# Patient Record
Sex: Male | Born: 1940
Health system: Southern US, Community
[De-identification: ages and names within clinical notes are randomized; demographics above are authoritative.]

## PROBLEM LIST (undated history)

## (undated) DIAGNOSIS — M1990 Unspecified osteoarthritis, unspecified site: Secondary | ICD-10-CM

## (undated) DIAGNOSIS — E785 Hyperlipidemia, unspecified: Secondary | ICD-10-CM

## (undated) DIAGNOSIS — I10 Essential (primary) hypertension: Secondary | ICD-10-CM

## (undated) DIAGNOSIS — E119 Type 2 diabetes mellitus without complications: Secondary | ICD-10-CM

## (undated) DIAGNOSIS — N400 Enlarged prostate without lower urinary tract symptoms: Secondary | ICD-10-CM

## (undated) DIAGNOSIS — I2510 Atherosclerotic heart disease of native coronary artery without angina pectoris: Secondary | ICD-10-CM

## (undated) HISTORY — PX: CHOLECYSTECTOMY, LAPAROSCOPIC: SHX56

## (undated) HISTORY — DX: Benign prostatic hyperplasia without lower urinary tract symptoms: N40.0

## (undated) HISTORY — DX: Unspecified osteoarthritis, unspecified site: M19.90

## (undated) HISTORY — PX: APPENDECTOMY: SHX54

## (undated) HISTORY — DX: Atherosclerotic heart disease of native coronary artery without angina pectoris: I25.10

## (undated) HISTORY — DX: Essential (primary) hypertension: I10

## (undated) HISTORY — DX: Hyperlipidemia, unspecified: E78.5

---

## 2003-06-23 ENCOUNTER — Other Ambulatory Visit: Payer: Self-pay

## 2004-10-04 ENCOUNTER — Ambulatory Visit: Payer: Self-pay | Admitting: Internal Medicine

## 2004-11-03 ENCOUNTER — Ambulatory Visit: Payer: Self-pay | Admitting: Internal Medicine

## 2004-12-03 ENCOUNTER — Ambulatory Visit: Payer: Self-pay | Admitting: Internal Medicine

## 2007-07-25 ENCOUNTER — Ambulatory Visit: Payer: Self-pay | Admitting: Internal Medicine

## 2007-09-05 ENCOUNTER — Ambulatory Visit: Payer: Self-pay | Admitting: Internal Medicine

## 2008-08-04 ENCOUNTER — Ambulatory Visit: Payer: Self-pay | Admitting: Internal Medicine

## 2008-09-22 ENCOUNTER — Ambulatory Visit: Payer: Self-pay | Admitting: Unknown Physician Specialty

## 2008-11-05 ENCOUNTER — Ambulatory Visit: Payer: Self-pay | Admitting: Anesthesiology

## 2008-11-17 ENCOUNTER — Ambulatory Visit: Payer: Self-pay | Admitting: Internal Medicine

## 2008-11-25 ENCOUNTER — Ambulatory Visit: Payer: Self-pay | Admitting: Anesthesiology

## 2008-12-18 ENCOUNTER — Ambulatory Visit: Payer: Self-pay | Admitting: Anesthesiology

## 2009-01-19 ENCOUNTER — Ambulatory Visit: Payer: Self-pay | Admitting: Anesthesiology

## 2009-08-25 ENCOUNTER — Ambulatory Visit: Payer: Self-pay | Admitting: Anesthesiology

## 2009-09-10 ENCOUNTER — Ambulatory Visit: Payer: Self-pay | Admitting: Anesthesiology

## 2009-10-17 ENCOUNTER — Emergency Department: Payer: Self-pay | Admitting: Emergency Medicine

## 2009-10-19 ENCOUNTER — Ambulatory Visit: Payer: Self-pay | Admitting: Anesthesiology

## 2009-11-19 ENCOUNTER — Ambulatory Visit: Payer: Self-pay | Admitting: Anesthesiology

## 2009-12-09 ENCOUNTER — Ambulatory Visit: Payer: Self-pay | Admitting: Ophthalmology

## 2009-12-29 ENCOUNTER — Ambulatory Visit: Payer: Self-pay | Admitting: Ophthalmology

## 2010-07-03 IMAGING — CT CT ABD-PELV W/ CM
1 of 2 series · 14 of 32 positions shown, 18 images · non-contrast
Comparison: none

REASON FOR EXAM: right groin pain eval for hernia  METFORMIN
COMMENTS:

[Series 2: soft tissue · axial · 0.72mm/px · z∈[-527,-63]mm · 14 of 64 slices shown, 18 images]
[im 3/64  soft-tissue]
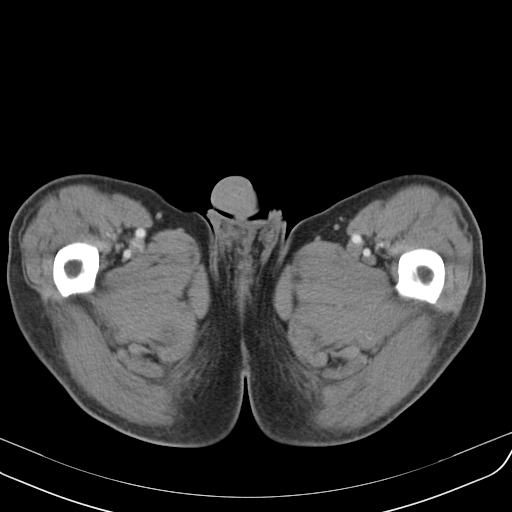
[im 3/64  bone]
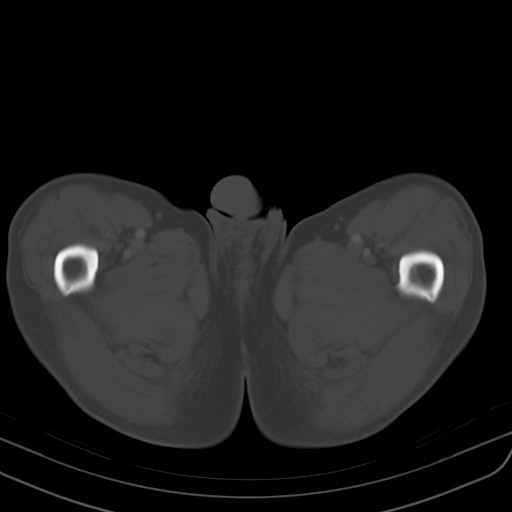
[im 9/64  soft-tissue]
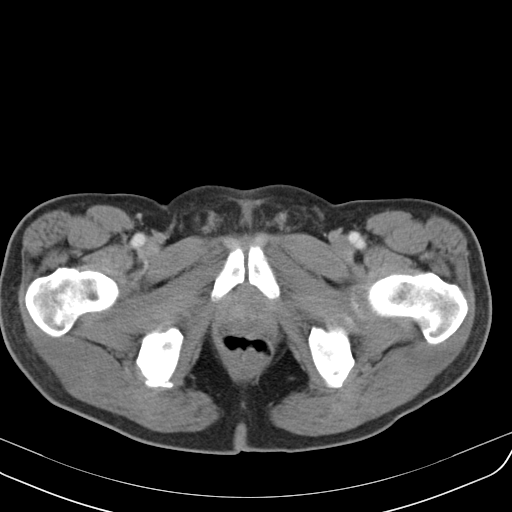
[im 14/64  soft-tissue]
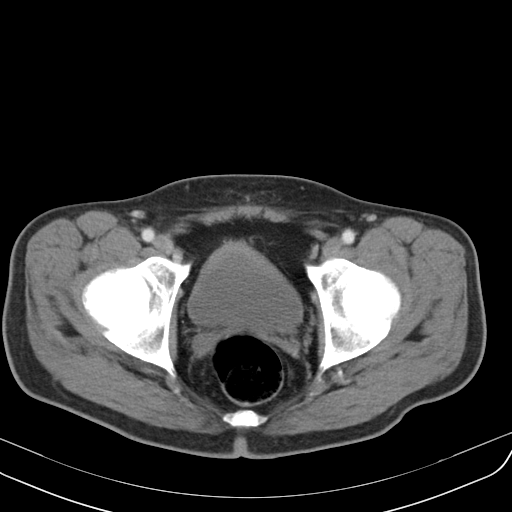
[im 20/64  soft-tissue]
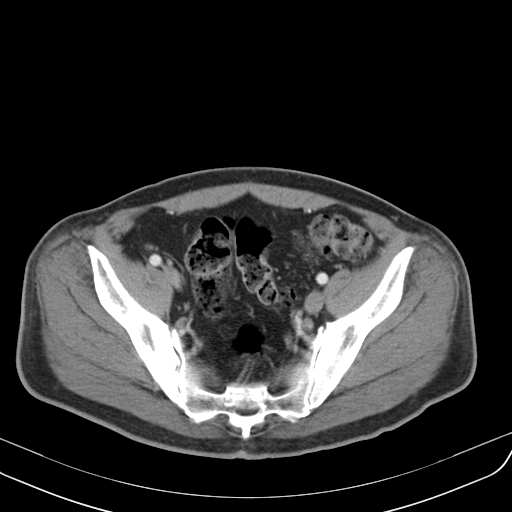
[im 25/64  soft-tissue]
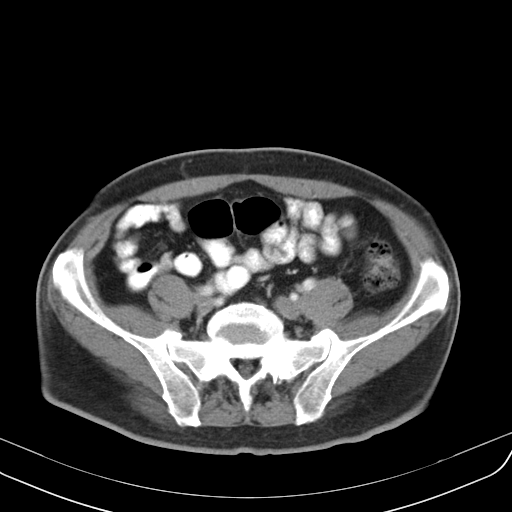
[im 31/64  soft-tissue]
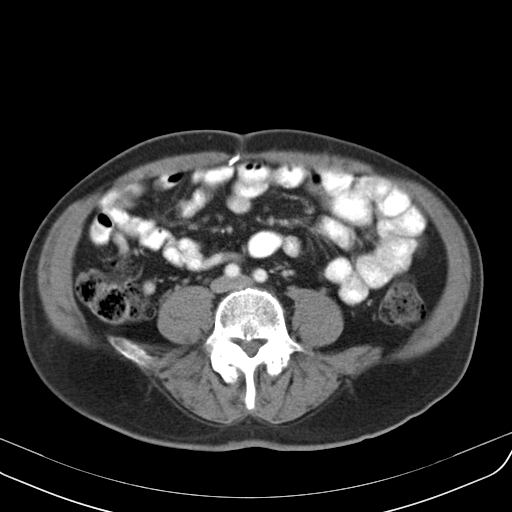
[im 33/64  soft-tissue]
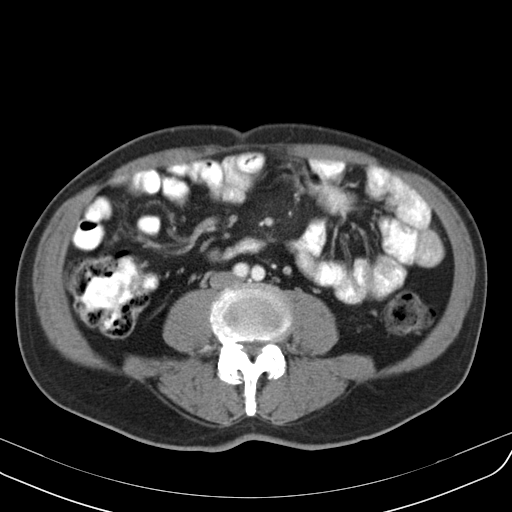
[im 39/64  soft-tissue]
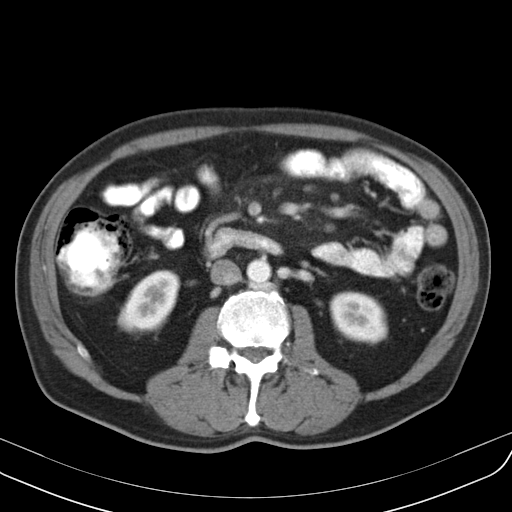
[im 44/64  soft-tissue]
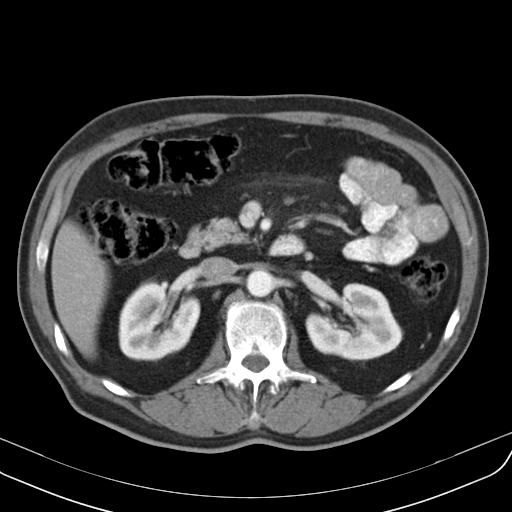
[im 44/64  bone]
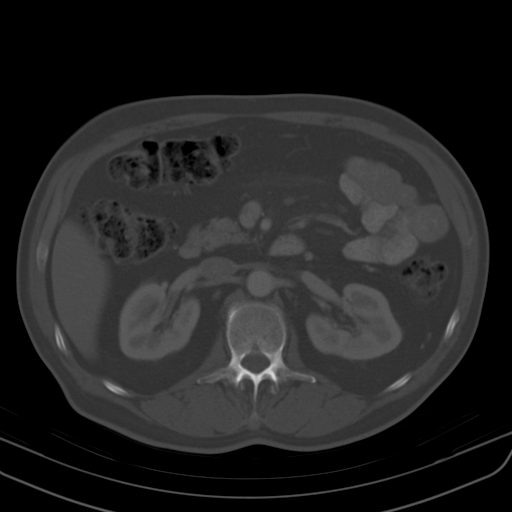
[im 50/64  soft-tissue]
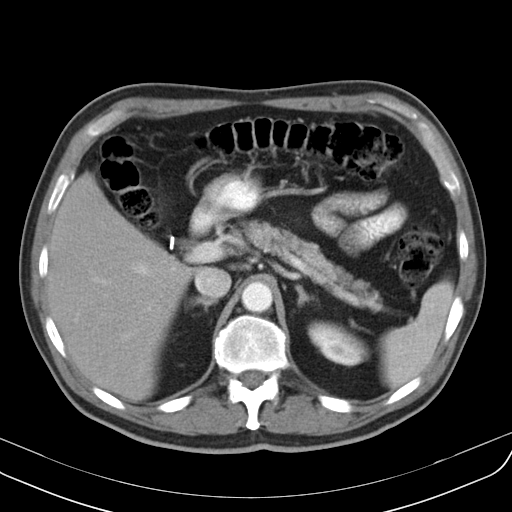
[im 53/64  lung]
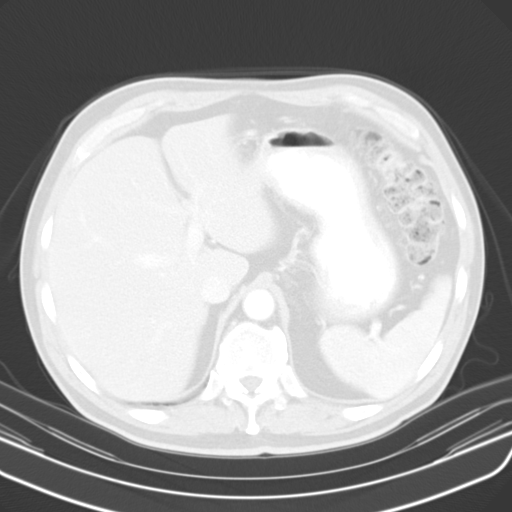
[im 55/64  soft-tissue]
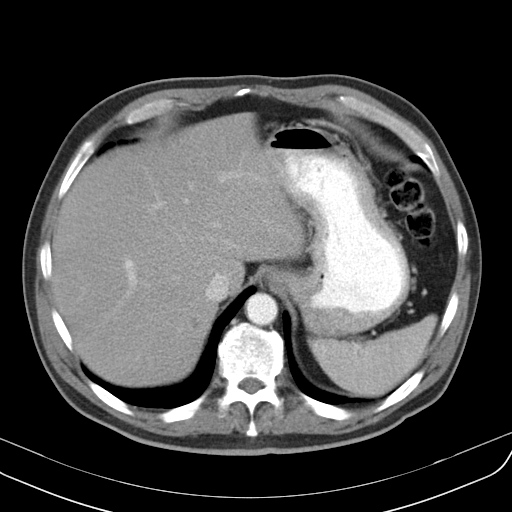
[im 55/64  lung]
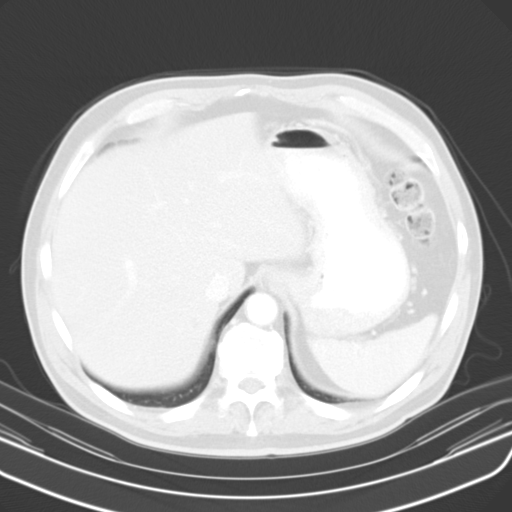
[im 58/64  lung]
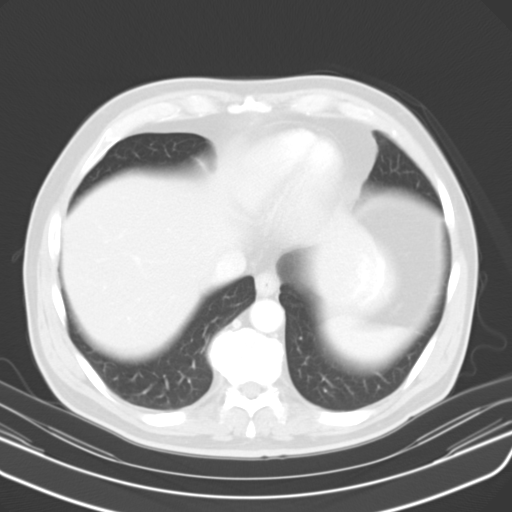
[im 61/64  soft-tissue]
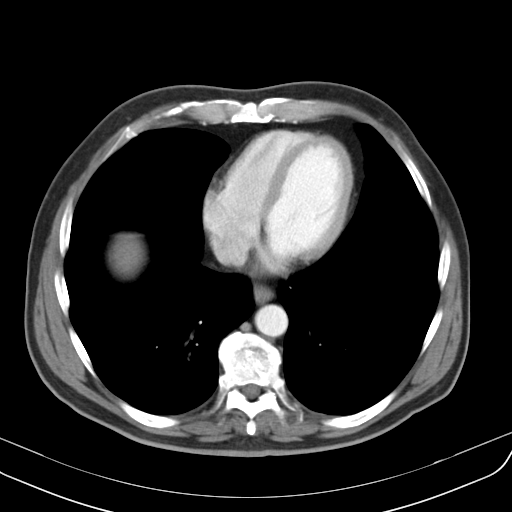
[im 61/64  lung]
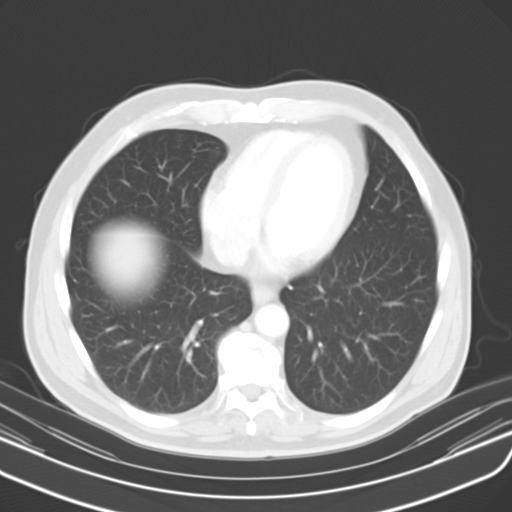

[14 of 32 positions shown; findings below may reference images not displayed]

PROCEDURE:     LOVREM - LOVREM ABDOMEN / PELVIS W  - July 25, 2007  [DATE]

RESULT:     The patient is reporting RIGHT lower quadrant discomfort. The
patient received oral contrast as well as 100 ml of Isovue 300.

In the RIGHT lower quadrant of the abdomen the appearance of the cecum and
ascending colon is grossly normal. I do not see objective evidence of acute
appendicitis nor of acute colitis. The terminal ileum is normal in
appearance. There is a small inguinal hernia on the RIGHT and similar
findings on the LEFT but only fat is visible within them. There is no
evidence of ascites. The urinary bladder is normal in appearance. The
prostate gland is mildly enlarged and produces an impression upon the
urinary bladder base. The sigmoid colon and rectum are grossly normal. The
caliber of the abdominal aorta is normal. The adrenal glands, kidneys,
spleen, partially distended stomach, and pancreas exhibit no acute
abnormality. Mildly increased density in the mesenteric fat is seen inferior
to the level of the transverse colon. This is nonspecific. The gallbladder
is surgically absent. The liver exhibits a subcapsular hypodensity seen on
image #9. It measures approximately 8 x 10 mm and has Hounsfield measurement
of 37. This is a nonspecific finding. The lung bases are clear. The lumbar
vertebral bodies are preserved in height
IMPRESSION: 1. I do not see evidence of urinary tract obstruction or other acute urinary
tract abnormality.
2. The appendix is not discretely identified but I see no findings
suspicious for an abnormal appendix. The terminal ileum and cecum and
ascending colon exhibit no acute abnormality. There is no evidence of bowel
obstruction.
3. There is enlargement of the prostate gland with a moderate impression
made upon the urinary bladder base.
4. There is hypodensity in the RIGHT lobe of the liver seen best on image #9
which is nonspecific. This will merit follow-up scanning to assure
stability. It likely reflects a hemangioma.
5. There are very small inguinal hernias bilaterally containing fat. I do
not see evidence of nearby bowel loops. These hernias are quite minimal and
may be asymptomatic.

## 2012-10-25 ENCOUNTER — Emergency Department: Payer: Self-pay | Admitting: Emergency Medicine

## 2012-11-15 ENCOUNTER — Emergency Department: Payer: Self-pay | Admitting: Internal Medicine

## 2012-12-13 ENCOUNTER — Ambulatory Visit: Payer: Self-pay | Admitting: Family Medicine

## 2013-05-14 ENCOUNTER — Emergency Department: Payer: Self-pay | Admitting: Emergency Medicine

## 2013-05-14 LAB — CBC
HCT: 45.6 % (ref 40.0–52.0)
HGB: 15 g/dL (ref 13.0–18.0)
MCH: 28 pg (ref 26.0–34.0)
MCHC: 32.9 g/dL (ref 32.0–36.0)
MCV: 85 fL (ref 80–100)
Platelet: 241 10*3/uL (ref 150–440)
RBC: 5.36 10*6/uL (ref 4.40–5.90)
RDW: 14 % (ref 11.5–14.5)
WBC: 6.9 10*3/uL (ref 3.8–10.6)

## 2013-05-14 LAB — URINALYSIS, COMPLETE
Bilirubin,UR: NEGATIVE
KETONE: NEGATIVE
Nitrite: NEGATIVE
Ph: 5 (ref 4.5–8.0)
Protein: 30
RBC,UR: 7 /HPF (ref 0–5)
SPECIFIC GRAVITY: 1.029 (ref 1.003–1.030)
WBC UR: 291 /HPF (ref 0–5)

## 2013-05-14 LAB — COMPREHENSIVE METABOLIC PANEL
ALT: 14 U/L (ref 12–78)
AST: 20 U/L (ref 15–37)
Albumin: 3.5 g/dL (ref 3.4–5.0)
Alkaline Phosphatase: 224 U/L — ABNORMAL HIGH
Anion Gap: 6 — ABNORMAL LOW (ref 7–16)
BILIRUBIN TOTAL: 0.5 mg/dL (ref 0.2–1.0)
BUN: 10 mg/dL (ref 7–18)
CALCIUM: 9.3 mg/dL (ref 8.5–10.1)
CO2: 29 mmol/L (ref 21–32)
CREATININE: 1.16 mg/dL (ref 0.60–1.30)
Chloride: 100 mmol/L (ref 98–107)
EGFR (African American): >60
EGFR (Non-African Amer.): >60
Glucose: 263 mg/dL — ABNORMAL HIGH (ref 65–99)
Osmolality: 278 (ref 275–301)
POTASSIUM: 3.7 mmol/L (ref 3.5–5.1)
Sodium: 135 mmol/L — ABNORMAL LOW (ref 136–145)
Total Protein: 7.8 g/dL (ref 6.4–8.2)

## 2013-05-15 LAB — URINE CULTURE

## 2014-06-17 ENCOUNTER — Other Ambulatory Visit: Payer: Self-pay | Admitting: Orthopedic Surgery

## 2014-06-17 DIAGNOSIS — M5412 Radiculopathy, cervical region: Secondary | ICD-10-CM

## 2014-06-23 ENCOUNTER — Ambulatory Visit
Admission: RE | Admit: 2014-06-23 | Discharge: 2014-06-23 | Disposition: A | Discharge status: Home / Self Care | Payer: Medicare Other | Source: Ambulatory Visit | Attending: Orthopedic Surgery | Admitting: Orthopedic Surgery

## 2014-06-23 DIAGNOSIS — M4802 Spinal stenosis, cervical region: Secondary | ICD-10-CM | POA: Insufficient documentation

## 2014-06-23 DIAGNOSIS — M542 Cervicalgia: Secondary | ICD-10-CM | POA: Diagnosis present

## 2014-06-23 DIAGNOSIS — M4852XA Collapsed vertebra, not elsewhere classified, cervical region, initial encounter for fracture: Secondary | ICD-10-CM | POA: Insufficient documentation

## 2014-06-23 DIAGNOSIS — M5412 Radiculopathy, cervical region: Secondary | ICD-10-CM

## 2014-06-23 DIAGNOSIS — M5030 Other cervical disc degeneration, unspecified cervical region: Secondary | ICD-10-CM | POA: Insufficient documentation

## 2014-06-23 DIAGNOSIS — G9589 Other specified diseases of spinal cord: Secondary | ICD-10-CM | POA: Insufficient documentation

## 2014-06-23 DIAGNOSIS — M4682 Other specified inflammatory spondylopathies, cervical region: Secondary | ICD-10-CM | POA: Insufficient documentation

## 2014-09-11 ENCOUNTER — Emergency Department
Admission: EM | Admit: 2014-09-11 | Discharge: 2014-09-11 | Disposition: A | Discharge status: Home / Self Care | Payer: Medicare Other | Attending: Emergency Medicine | Admitting: Emergency Medicine

## 2014-09-11 ENCOUNTER — Encounter: Payer: Self-pay | Admitting: Emergency Medicine

## 2014-09-11 DIAGNOSIS — Y998 Other external cause status: Secondary | ICD-10-CM | POA: Diagnosis not present

## 2014-09-11 DIAGNOSIS — Y92410 Unspecified street and highway as the place of occurrence of the external cause: Secondary | ICD-10-CM | POA: Diagnosis not present

## 2014-09-11 DIAGNOSIS — E119 Type 2 diabetes mellitus without complications: Secondary | ICD-10-CM | POA: Insufficient documentation

## 2014-09-11 DIAGNOSIS — Z794 Long term (current) use of insulin: Secondary | ICD-10-CM | POA: Insufficient documentation

## 2014-09-11 DIAGNOSIS — Y9389 Activity, other specified: Secondary | ICD-10-CM | POA: Insufficient documentation

## 2014-09-11 DIAGNOSIS — S61431A Puncture wound without foreign body of right hand, initial encounter: Secondary | ICD-10-CM | POA: Insufficient documentation

## 2014-09-11 DIAGNOSIS — Z79899 Other long term (current) drug therapy: Secondary | ICD-10-CM | POA: Diagnosis not present

## 2014-09-11 DIAGNOSIS — W540XXA Bitten by dog, initial encounter: Secondary | ICD-10-CM | POA: Insufficient documentation

## 2014-09-11 DIAGNOSIS — S61451A Open bite of right hand, initial encounter: Secondary | ICD-10-CM | POA: Diagnosis present

## 2014-09-11 HISTORY — DX: Type 2 diabetes mellitus without complications: E11.9
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
## 2014-09-11 NOTE — Discharge Instructions (Signed)
Animal Bite Animal bite wounds can get infected. It is important to get proper medical treatment. Ask your doctor if you need a rabies shot. HOME CARE   Follow your doctor's instructions for taking care of your wound.  Only take medicine as told by your doctor.  Take your medicine (antibiotics) as told. Finish them even if you start to feel better.  Keep all doctor visits as told. You may need a tetanus shot if:   You cannot remember when you had your last tetanus shot.  You have never had a tetanus shot.  The injury broke your skin. If you need a tetanus shot and you choose not to have one, you may get tetanus. Sickness from tetanus can be serious. GET HELP RIGHT AWAY IF:   Your wound is warm, red, sore, or puffy (swollen).  You notice yellowish-white fluid (pus) or a bad smell coming from the wound.  You see a red line on the skin coming from the wound.  You have a fever, chills, or you feel sick.  You feel sick to your stomach (nauseous), or you throw up (vomit).  Your pain does not go away, or it gets worse.  You have trouble moving the injured part.  You have questions or concerns. MAKE SURE YOU:   Understand these instructions.  Will watch your condition.  Will get help right away if you are not doing well or get worse. Document Released: 12/20/2004 Document Revised: 03/14/2011 Document Reviewed: 08/11/2010 [facility] Patient Information 2015 [place], [place]. This information is not intended to replace advice given to you by your health care provider. Make sure you discuss any questions you have with your health care provider.    FOLLOW UP WITH YOUR DOCTOR IF ANY CONTINUED PROBLEM   CLEAN EVERY DAY WITH MILD SOAP AND WATER TAKE ANTIBIOTICS THAT YOUR DOCTOR CALLED IN RETURN IF ANY SIGNS OF INFECTION OR IF [place] [employer] RECOMMENDS RABIES SHOTS

## 2014-10-20 ENCOUNTER — Encounter
Admission: EM | Disposition: A | Discharge status: Home / Self Care | Payer: Self-pay | Source: Home / Self Care | Attending: Internal Medicine

## 2014-10-20 ENCOUNTER — Inpatient Hospital Stay
Admission: EM | Admit: 2014-10-20 | Discharge: 2014-10-22 | DRG: 247 | Disposition: A | Discharge status: Home / Self Care | Payer: Medicare Other | Attending: Internal Medicine | Admitting: Internal Medicine

## 2014-10-20 DIAGNOSIS — Z955 Presence of coronary angioplasty implant and graft: Secondary | ICD-10-CM | POA: Diagnosis not present

## 2014-10-20 DIAGNOSIS — M1990 Unspecified osteoarthritis, unspecified site: Secondary | ICD-10-CM | POA: Diagnosis present

## 2014-10-20 DIAGNOSIS — E1159 Type 2 diabetes mellitus with other circulatory complications: Secondary | ICD-10-CM | POA: Diagnosis present

## 2014-10-20 DIAGNOSIS — Z79899 Other long term (current) drug therapy: Secondary | ICD-10-CM

## 2014-10-20 DIAGNOSIS — Z87891 Personal history of nicotine dependence: Secondary | ICD-10-CM

## 2014-10-20 DIAGNOSIS — E876 Hypokalemia: Secondary | ICD-10-CM | POA: Diagnosis present

## 2014-10-20 DIAGNOSIS — I2510 Atherosclerotic heart disease of native coronary artery without angina pectoris: Secondary | ICD-10-CM

## 2014-10-20 DIAGNOSIS — I10 Essential (primary) hypertension: Secondary | ICD-10-CM | POA: Diagnosis present

## 2014-10-20 DIAGNOSIS — I2109 ST elevation (STEMI) myocardial infarction involving other coronary artery of anterior wall: Secondary | ICD-10-CM | POA: Diagnosis present

## 2014-10-20 DIAGNOSIS — E785 Hyperlipidemia, unspecified: Secondary | ICD-10-CM | POA: Diagnosis present

## 2014-10-20 DIAGNOSIS — I2102 ST elevation (STEMI) myocardial infarction involving left anterior descending coronary artery: Secondary | ICD-10-CM | POA: Diagnosis not present

## 2014-10-20 DIAGNOSIS — R079 Chest pain, unspecified: Secondary | ICD-10-CM | POA: Diagnosis not present

## 2014-10-20 DIAGNOSIS — N400 Enlarged prostate without lower urinary tract symptoms: Secondary | ICD-10-CM | POA: Diagnosis present

## 2014-10-20 DIAGNOSIS — E1165 Type 2 diabetes mellitus with hyperglycemia: Secondary | ICD-10-CM | POA: Diagnosis present

## 2014-10-20 DIAGNOSIS — Z794 Long term (current) use of insulin: Secondary | ICD-10-CM | POA: Diagnosis present

## 2014-10-20 HISTORY — PX: CARDIAC CATHETERIZATION: SHX172

## 2014-10-20 LAB — CBC
HEMATOCRIT: 48.4 % (ref 40.0–52.0)
HEMOGLOBIN: 16.8 g/dL (ref 13.0–18.0)
MCH: 30.5 pg (ref 26.0–34.0)
MCHC: 34.6 g/dL (ref 32.0–36.0)
MCV: 87.9 fL (ref 80.0–100.0)
Platelets: 182 10*3/uL (ref 150–440)
RBC: 5.5 MIL/uL (ref 4.40–5.90)
RDW: 13.7 % (ref 11.5–14.5)
WBC: 11.2 10*3/uL — AB (ref 3.8–10.6)

## 2014-10-20 LAB — BASIC METABOLIC PANEL
ANION GAP: 12 (ref 5–15)
BUN: 14 mg/dL (ref 6–20)
CHLORIDE: 99 mmol/L — AB (ref 101–111)
CO2: 27 mmol/L (ref 22–32)
Calcium: 10.9 mg/dL — ABNORMAL HIGH (ref 8.9–10.3)
Creatinine, Ser: 1.18 mg/dL (ref 0.61–1.24)
GFR, EST NON AFRICAN AMERICAN: 60 mL/min — AB (ref >60)
Glucose, Bld: 315 mg/dL — ABNORMAL HIGH (ref 65–99)
POTASSIUM: 3.6 mmol/L (ref 3.5–5.1)
SODIUM: 138 mmol/L (ref 135–145)

## 2014-10-20 LAB — MRSA PCR SCREENING: MRSA by PCR: NEGATIVE

## 2014-10-20 LAB — PROTIME-INR
INR: 1.06
Prothrombin Time: 14 seconds (ref 11.4–15.0)

## 2014-10-20 LAB — DIFFERENTIAL
BASOS PCT: 1 %
Basophils Absolute: 0.1 10*3/uL (ref 0–0.1)
Eosinophils Absolute: 0.1 10*3/uL (ref 0–0.7)
Eosinophils Relative: 1 %
LYMPHS ABS: 1.9 10*3/uL (ref 1.0–3.6)
LYMPHS PCT: 17 %
MONO ABS: 1 10*3/uL (ref 0.2–1.0)
MONOS PCT: 9 %
NEUTROS ABS: 8.1 10*3/uL — AB (ref 1.4–6.5)
Neutrophils Relative %: 72 %

## 2014-10-20 LAB — GLUCOSE, CAPILLARY
GLUCOSE-CAPILLARY: 257 mg/dL — AB (ref 65–99)
GLUCOSE-CAPILLARY: 223 mg/dL — AB (ref 65–99)
Glucose-Capillary: 233 mg/dL — ABNORMAL HIGH (ref 65–99)

## 2014-10-20 LAB — APTT: APTT: 25 s (ref 24–36)

## 2014-10-20 LAB — TROPONIN I

## 2014-10-20 SURGERY — LEFT HEART CATH AND CORONARY ANGIOGRAPHY
Anesthesia: Moderate Sedation | Site: Arm Lower | Laterality: Right

## 2014-10-20 MED ORDER — SODIUM CHLORIDE 0.9 % IV SOLN
0.2500 mg/kg/h | INTRAVENOUS | Status: AC
  Filled 2014-10-20 (×3): qty 250

## 2014-10-20 MED ORDER — SODIUM CHLORIDE 0.9 % IV SOLN
INTRAVENOUS | Status: AC
  Administered 2014-10-20 (×2): via INTRAVENOUS

## 2014-10-20 MED ORDER — FENTANYL CITRATE (PF) 100 MCG/2ML IJ SOLN
INTRAMUSCULAR | Status: AC
  Filled 2014-10-20: qty 2

## 2014-10-20 MED ORDER — TICAGRELOR 90 MG PO TABS
90.0000 mg | ORAL_TABLET | Freq: Two times a day (BID) | ORAL | Status: DC
  Administered 2014-10-20 – 2014-10-22 (×5): 90 mg via ORAL
  Filled 2014-10-20 (×5): qty 1

## 2014-10-20 MED ORDER — INSULIN ASPART 100 UNIT/ML ~~LOC~~ SOLN
0.0000 [IU] | Freq: Three times a day (TID) | SUBCUTANEOUS | Status: DC
  Administered 2014-10-20: 8 [IU] via SUBCUTANEOUS
  Administered 2014-10-21: 5 [IU] via SUBCUTANEOUS
  Administered 2014-10-21 – 2014-10-22 (×3): 3 [IU] via SUBCUTANEOUS
  Filled 2014-10-20 (×2): qty 3
  Filled 2014-10-20: qty 8
  Filled 2014-10-20: qty 3
  Filled 2014-10-20: qty 5

## 2014-10-20 MED ORDER — BIVALIRUDIN 250 MG IV SOLR
INTRAVENOUS | Status: AC
  Filled 2014-10-20: qty 250

## 2014-10-20 MED ORDER — HEPARIN SODIUM (PORCINE) 1000 UNIT/ML IJ SOLN
INTRAMUSCULAR | Status: AC
  Filled 2014-10-20: qty 1

## 2014-10-20 MED ORDER — MIDAZOLAM HCL 2 MG/2ML IJ SOLN
INTRAMUSCULAR | Status: AC
  Filled 2014-10-20: qty 2

## 2014-10-20 MED ORDER — LISINOPRIL 10 MG PO TABS
10.0000 mg | ORAL_TABLET | Freq: Every day | ORAL | Status: DC
  Administered 2014-10-20 – 2014-10-22 (×3): 10 mg via ORAL
  Filled 2014-10-20 (×3): qty 1

## 2014-10-20 MED ORDER — VERAPAMIL HCL 2.5 MG/ML IV SOLN
INTRAVENOUS | Status: DC | PRN
  Administered 2014-10-20: 2.5 mg via INTRA_ARTERIAL

## 2014-10-20 MED ORDER — NITROGLYCERIN 5 MG/ML IV SOLN
INTRAVENOUS | Status: AC
  Filled 2014-10-20: qty 10

## 2014-10-20 MED ORDER — BIVALIRUDIN BOLUS VIA INFUSION - CUPID
INTRAVENOUS | Status: DC | PRN
  Administered 2014-10-20: 54.45 mg via INTRAVENOUS

## 2014-10-20 MED ORDER — TICAGRELOR 90 MG PO TABS
ORAL_TABLET | ORAL | Status: DC | PRN
  Administered 2014-10-20: 180 mg via ORAL

## 2014-10-20 MED ORDER — SODIUM CHLORIDE 0.9 % IJ SOLN
3.0000 mL | Freq: Two times a day (BID) | INTRAMUSCULAR | Status: DC
  Administered 2014-10-20 – 2014-10-22 (×4): 3 mL via INTRAVENOUS

## 2014-10-20 MED ORDER — ATORVASTATIN CALCIUM 20 MG PO TABS
80.0000 mg | ORAL_TABLET | Freq: Every day | ORAL | Status: DC
  Administered 2014-10-20 – 2014-10-21 (×2): 80 mg via ORAL
  Filled 2014-10-20 (×2): qty 4

## 2014-10-20 MED ORDER — ASPIRIN 81 MG PO CHEW
81.0000 mg | CHEWABLE_TABLET | Freq: Every day | ORAL | Status: DC
  Administered 2014-10-20 – 2014-10-22 (×3): 81 mg via ORAL
  Filled 2014-10-20 (×3): qty 1

## 2014-10-20 MED ORDER — NITROGLYCERIN IN D5W 200-5 MCG/ML-% IV SOLN: 0.0000 ug/min | INTRAVENOUS | Status: DC

## 2014-10-20 MED ORDER — VERAPAMIL HCL 2.5 MG/ML IV SOLN
INTRAVENOUS | Status: AC
  Filled 2014-10-20: qty 2

## 2014-10-20 MED ORDER — IOHEXOL 300 MG/ML  SOLN
INTRAMUSCULAR | Status: DC | PRN
  Administered 2014-10-20: 230 mL via INTRA_ARTERIAL

## 2014-10-20 MED ORDER — ONDANSETRON HCL 4 MG/2ML IJ SOLN: 4.0000 mg | Freq: Four times a day (QID) | INTRAMUSCULAR | Status: DC | PRN

## 2014-10-20 MED ORDER — SODIUM CHLORIDE 0.9 % IV SOLN: 250.0000 mL | INTRAVENOUS | Status: DC | PRN

## 2014-10-20 MED ORDER — NITROGLYCERIN 1 MG/10 ML FOR IR/CATH LAB
INTRA_ARTERIAL | Status: DC | PRN
  Administered 2014-10-20: 200 ug via INTRACORONARY

## 2014-10-20 MED ORDER — TICAGRELOR 90 MG PO TABS
ORAL_TABLET | ORAL | Status: AC
  Filled 2014-10-20: qty 2

## 2014-10-20 MED ORDER — HEPARIN (PORCINE) IN NACL 2-0.9 UNIT/ML-% IJ SOLN
INTRAMUSCULAR | Status: AC
  Filled 2014-10-20: qty 500

## 2014-10-20 MED ORDER — SODIUM CHLORIDE 0.9 % IV BOLUS (SEPSIS)
INTRAVENOUS | Status: DC | PRN
  Administered 2014-10-20: 250 mL via INTRAVENOUS

## 2014-10-20 MED ORDER — SODIUM CHLORIDE 0.9 % IJ SOLN: 3.0000 mL | INTRAMUSCULAR | Status: DC | PRN

## 2014-10-20 MED ORDER — ACETAMINOPHEN 325 MG PO TABS: 650.0000 mg | ORAL_TABLET | ORAL | Status: DC | PRN

## 2014-10-20 MED ORDER — SODIUM CHLORIDE 0.9 % IV SOLN
250.0000 mg | INTRAVENOUS | Status: DC | PRN
  Administered 2014-10-20: 1.75 mg/kg/h via INTRAVENOUS

## 2014-10-20 MED ORDER — MORPHINE SULFATE (PF) 2 MG/ML IV SOLN
2.0000 mg | INTRAVENOUS | Status: DC | PRN
  Administered 2014-10-20 (×2): 2 mg via INTRAVENOUS
  Filled 2014-10-20 (×2): qty 1

## 2014-10-20 MED ORDER — NITROGLYCERIN IN D5W 200-5 MCG/ML-% IV SOLN
INTRAVENOUS | Status: AC
  Filled 2014-10-20: qty 250

## 2014-10-20 MED ORDER — HEPARIN SODIUM (PORCINE) 5000 UNIT/ML IJ SOLN
4000.0000 [IU] | Freq: Once | INTRAMUSCULAR | Status: AC
  Administered 2014-10-20: 4000 [IU] via INTRAVENOUS

## 2014-10-20 MED ORDER — CARVEDILOL 3.125 MG PO TABS
3.1250 mg | ORAL_TABLET | Freq: Two times a day (BID) | ORAL | Status: DC
  Administered 2014-10-20 – 2014-10-22 (×4): 3.125 mg via ORAL
  Filled 2014-10-20 (×4): qty 1

## 2014-10-20 MED ORDER — ASPIRIN 81 MG PO CHEW
324.0000 mg | CHEWABLE_TABLET | Freq: Once | ORAL | Status: AC
  Administered 2014-10-20: 324 mg via ORAL

## 2014-10-20 MED ORDER — NITROGLYCERIN IN D5W 200-5 MCG/ML-% IV SOLN
0.0000 ug/min | Freq: Once | INTRAVENOUS | Status: AC
  Administered 2014-10-20: 20 ug/min via INTRAVENOUS

## 2014-10-20 MED ORDER — INSULIN GLARGINE 100 UNIT/ML ~~LOC~~ SOLN
28.0000 [IU] | Freq: Every day | SUBCUTANEOUS | Status: DC
Start: 2014-10-20 — End: 2014-10-22
  Administered 2014-10-20 – 2014-10-21 (×2): 28 [IU] via SUBCUTANEOUS
  Filled 2014-10-20 (×2): qty 0.28

## 2014-10-20 MED ORDER — PNEUMOCOCCAL VAC POLYVALENT 25 MCG/0.5ML IJ INJ
0.5000 mL | INJECTION | INTRAMUSCULAR | Status: AC
  Administered 2014-10-21: 0.5 mL via INTRAMUSCULAR
  Filled 2014-10-20: qty 0.5

## 2014-10-20 MED ORDER — MIDAZOLAM HCL 2 MG/2ML IJ SOLN
INTRAMUSCULAR | Status: DC | PRN
  Administered 2014-10-20: 1 mg via INTRAVENOUS

## 2014-10-20 MED ORDER — FENTANYL CITRATE (PF) 100 MCG/2ML IJ SOLN
INTRAMUSCULAR | Status: DC | PRN
  Administered 2014-10-20 (×2): 50 ug via INTRAVENOUS

## 2014-10-20 SURGICAL SUPPLY — 19 items
BALLN TREK RX 2.5X15 (BALLOONS) ×4
BALLN TREK RX 2.5X8 (BALLOONS) ×4
BALLN ~~LOC~~ TREK RX 3.0X20 (BALLOONS) ×4
BALLOON TREK RX 2.5X15 (BALLOONS) ×2 IMPLANT
BALLOON TREK RX 2.5X8 (BALLOONS) ×2 IMPLANT
BALLOON ~~LOC~~ TREK RX 3.0X20 (BALLOONS) ×2 IMPLANT
CATH HEARTRAIL 6F IL3.5 (CATHETERS) ×4 IMPLANT
CATH INFINITI 5FR ANG PIGTAIL (CATHETERS) ×4 IMPLANT
CATH VISTA GUIDE 6FR JL3.5 (CATHETERS) ×4 IMPLANT
CATH VISTA GUIDE 6FR JR4 (CATHETERS) ×4 IMPLANT
DEVICE INFLAT 30 PLUS (MISCELLANEOUS) ×4 IMPLANT
DEVICE RAD TR BAND REGULAR (VASCULAR PRODUCTS) ×8 IMPLANT
GLIDESHEATH SLEND SS 6F .021 (SHEATH) ×4 IMPLANT
KIT MANI 3VAL PERCEP (MISCELLANEOUS) ×4 IMPLANT
PACK CARDIAC CATH (CUSTOM PROCEDURE TRAY) ×4 IMPLANT
STENT XIENCE ALPINE RX 2.75X15 (Permanent Stent) ×4 IMPLANT
STENT XIENCE ALPINE RX 2.75X38 (Permanent Stent) ×4 IMPLANT
WIRE RUNTHROUGH .014X180CM (WIRE) ×4 IMPLANT
WIRE SAFE-T 1.5MM-J .035X260CM (WIRE) ×4 IMPLANT

## 2014-10-20 NOTE — Progress Notes (Signed)
Dr. Audelia Acton at bedside, made aware of troponin.

## 2014-10-20 NOTE — Progress Notes (Signed)
ANTICOAGULATION CONSULT NOTE - Initial Consult  Pharmacy Consult for Heparin Drip Indication: chest pain/ACS  No Known Allergies  Patient Measurements: Height: 5\' 9"  (175.3 cm) Weight: 160 lb (72.576 kg) IBW/kg (Calculated) : 70.7 Heparin Dosing Weight: 72.6 kg   Vital Signs: Temp: 97.7 F (36.5 C) (10/17 0851) Temp Source: Oral (10/17 0851) BP: 201/94 mmHg (10/17 0851) Pulse Rate: 87 (10/17 0851)  Labs:  Recent Labs  10/20/14 0859  HGB 16.8  HCT 48.4  PLT 182  CREATININE 1.18    Estimated Creatinine Clearance: 56.6 mL/min (by C-[name] formula based on Cr of 1.18).   Medical History: Past Medical History  Diagnosis Date  . Diabetes mellitus without complication (HCC)     Medications:  Scheduled:  . nitroGLYCERIN        Assessment: Pharmacy consulted to initiate and monitor heparin drip in a 74 yo male with ACS/STEMI.  Patient has received Heparin 4000 units IV once in ED.  Patient currently in Cath Lab for procedure.   Goal of Therapy:  Heparin level 0.3-0.7 units/ml Monitor platelets by anticoagulation protocol: Yes   Plan:  Spoke with Cath Lab. No heparin drip at this time. Will continue to follow for orders post cath.    [name] 10/20/2014,9:20 AM

## 2014-10-20 NOTE — H&P (Addendum)
History and Physical  Patient ID: [name] MRN: [id number] DOB/AGE: 1940/03/18 74 y.o. Admit date: 10/20/2014  Primary Care Physician: Dr. Kym Groom Primary Cardiologist : New   HPI:  This is a 74 year old male with no previous cardiac history. He has known history of diabetes requiring insulin, BPH and osteoarthritis. He has remote history of tobacco use. He presented with substernal chest pain described as sharp and aching discomfort which started yesterday around 3 in the afternoon. The patient did not seek medical attention until this morning when his chest pain intensified. He reports no previous similar symptoms. The pain was severe upon presentation with shortness of breath and fatigue. His initial EKG in the emergency room showed evidence of anterior ST elevation and thus a code STEMI was activated.    Review of systems complete and found to be negative unless listed above  Past Medical History  Diagnosis Date  . Diabetes mellitus without complication ([place])     Family history: Mother had a stroke later in life.  There is family history of CAD. Sister had an MI and diabetes.   Social History   Social History  . Marital Status: Widowed    Spouse Name: N/A  . Number of Children: N/A  . Years of Education: N/A   Occupational History  . Not on file.   Social History Main Topics  . Smoking status: Never Smoker   . Smokeless tobacco: Not on file  . Alcohol Use: No  . Drug Use: No  . Sexual Activity: Not on file   Other Topics Concern  . Not on file   Social History Narrative    History reviewed. No pertinent past surgical history.   Prescriptions prior to admission  Medication Sig Dispense Refill Last Dose  . insulin aspart (NOVOLOG) 100 UNIT/ML injection Inject into the skin 3 (three) times daily before meals.     . metFORMIN (GLUCOPHAGE) 500 MG tablet Take 500 mg by mouth 2 (two) times daily with a meal.       Physical Exam: Blood pressure 201/94,  pulse 87, temperature 98.1 F (36.7 C), temperature source Oral, resp. rate 18, height 5\' 9"  (1.753 m), weight 160 lb (72.576 kg), SpO2 100 %.   Constitutional: He is oriented to person, place, and time. He appears well-developed and well-nourished. No distress.  HENT: No nasal discharge.  Head: Normocephalic and atraumatic.  Eyes: Pupils are equal and round.  No discharge. Neck: Normal range of motion. Neck supple. No JVD present. No thyromegaly present.  Cardiovascular: Normal rate, regular rhythm, normal heart sounds. Exam reveals no gallop and no friction rub. No murmur heard.  Pulmonary/Chest: Effort normal and breath sounds normal. No stridor. No respiratory distress. He has no wheezes. He has no rales. He exhibits no tenderness.  Abdominal: Soft. Bowel sounds are normal. He exhibits no distension. There is no tenderness. There is no rebound and no guarding.  Musculoskeletal: Normal range of motion. He exhibits no edema and no tenderness.  Neurological: He is alert and oriented to person, place, and time. Coordination normal.  Skin: Skin is warm and dry. No rash noted. He is not diaphoretic. No erythema. No pallor.  Psychiatric: He has a normal mood and affect. His behavior is normal. Judgment and thought content normal.       Labs:   Lab Results  Component Value Date   WBC 11.2* 10/20/2014   HGB 16.8 10/20/2014   HCT 48.4 10/20/2014   MCV 87.9 10/20/2014  PLT 182 10/20/2014    Recent Labs Lab 10/20/14 0859  NA 138  K 3.6  CL 99*  CO2 27  BUN 14  CREATININE 1.18  CALCIUM 10.9*  GLUCOSE 315*   No results found for: CKTOTAL, CKMB, CKMBINDEX, TROPONINI No results found for: CHOL No results found for: HDL No results found for: LDLCALC No results found for: TRIG No results found for: CHOLHDL No results found for: LDLDIRECT    Radiology: No results found.  EKG:  Normal sinus rhythm with anterior ST elevation.   ASSESSMENT AND PLAN:  1. Anterior ST elevation  myocardial infarction with late presentation: The patient is more than 12 hours from the onset of symptoms. Nonetheless, his chest pain has intensified this morning. Thus, I recommend proceeding with emergent cardiac catheterization and possible coronary intervention. I discussed risks, benefits and alternatives. Please check cardiac catheterization report for findings and post PCI recommendations.  2. Uncontrolled diabetes: I am going to request hospitalist consult.  3. Social: The patient currently has no family members with him. 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 I am going to request a case manager consult regarding this and also to help with [place].    Signed: Kathlyn Sacramento MD, [facility] 10/20/2014, 11:03 AM

## 2014-10-20 NOTE — ED Notes (Signed)
Pt c/o chest pain radiating across his chest since last night with N/V/diaphoresis.Marland Kitchendenies SOB.Marland Kitchen

## 2014-10-20 NOTE — ED Provider Notes (Signed)
Time Seen: Approximately. I have reviewed the triage notes ----------------------------------------- 9:14 AM on 10/20/2014 -----------------------------------------   Chief Complaint: Chest Pain   History of Present Illness: [name] is a 74 y.o. male who presents with substernal chest discomfort with radiation to the middle of her back which started intermittently overnight and then rather intense this morning. Patient drove himself here to emergency department. He states he feels nauseated, mild shortness of breath. He has significant history of diabetes and hypertension. He is currently on insulin and metformin. The patient denies any diaphoresis, arm pain, jaw pain. He denies any melena, hematochezia.  EKG shows findings consistent with acute myocardial infarction Past Medical History  Diagnosis Date  . Diabetes mellitus without complication ([place])     There are no active problems to display for this patient.   History reviewed. No pertinent past surgical history.  History reviewed. No pertinent past surgical history.  No current outpatient prescriptions on file.  Allergies:  Review of patient's allergies indicates no known allergies.  Family History: No family history on file.  Social History: Social History  Substance Use Topics  . Smoking status: Never Smoker   . Smokeless tobacco: None  . Alcohol Use: No     Review of Systems:   10 point review of systems was performed and was otherwise negative:  Constitutional: No fever Eyes: No visual disturbances ENT: No sore throat, ear pain Cardiac: Substernal intense pressure pain. Respiratory: No shortness of breath, wheezing, or stridor Abdomen: No abdominal pain, no vomiting, No diarrhea Endocrine: No weight loss, No night sweats Extremities: No peripheral edema, cyanosis Skin: No rashes, easy bruising Neurologic: No focal weakness, trouble with speech or swollowing Urologic: No dysuria, Hematuria,  or urinary frequency   Physical Exam:  ED Triage Vitals  Enc Vitals Group     BP 10/20/14 0851 201/94 mmHg     Pulse Rate 10/20/14 0851 87     Resp 10/20/14 0851 18     Temp 10/20/14 0851 97.7 F (36.5 C)     Temp Source 10/20/14 0851 Oral     SpO2 10/20/14 0851 100 %     Weight 10/20/14 0851 160 lb (72.576 kg)     Height 10/20/14 0851 5\' 9"  (1.753 m)     Head Cir --      Peak Flow --      Pain Score 10/20/14 0852 10     Pain Loc --      Pain Edu? --      Excl. in [place]? --     General: Awake , Alert , and Oriented times 3; GCS 15 Head: Normal cephalic , atraumatic Eyes: Pupils equal , round, reactive to light Nose/Throat: No nasal drainage, patent upper airway without erythema or exudate.  Neck: Supple, Full range of motion, No anterior adenopathy or palpable thyroid masses Lungs: Clear to ascultation without wheezes , rhonchi, or rales Heart: Regular rate, regular rhythm without murmurs , gallops , or rubs Abdomen: Soft, non tender without rebound, guarding , or rigidity; bowel sounds positive and symmetric in all 4 quadrants. No organomegaly .        Extremities: 2 plus symmetric pulses. No edema, clubbing or cyanosis Neurologic: normal ambulation, Motor symmetric without deficits, sensory intact Skin: warm, dry, no rashes   Labs:   All laboratory work was reviewed including any pertinent negatives or positives listed below:  Labs Reviewed  CBC - Abnormal; Notable for the following:    WBC 11.2 (*)  All other components within normal limits  DIFFERENTIAL - Abnormal; Notable for the following:    Neutro Abs 8.1 (*)    All other components within normal limits  PROTIME-INR  APTT  BASIC METABOLIC PANEL    EKG:  ED ECG REPORT I, Daymon Larsen, the attending physician, personally viewed and interpreted this ECG.  Date: 10/20/2014 EKG Time: 853 Rate: 68 Rhythm: normal sinus rhythm QRS Axis: normal Intervals: normal ST/T Wave abnormalities:  normal Conduction Disutrbances: none Narrative Interpretation: unremarkable    Procedures:  Consultation was placed with the interventional cardiology   Critical Care: CRITICAL CARE Performed by: Daymon Larsen   Total critical care time: 26 minutes  Critical care time was exclusive of separately billable procedures and treating other patients.  Critical care was necessary to treat or prevent imminent or life-threatening deterioration.  Critical care was time spent personally by me on the following activities: development of treatment plan with patient and/or surrogate as well as nursing, discussions with consultants, evaluation of patient's response to treatment, examination of patient, obtaining history from patient or surrogate, ordering and performing treatments and interventions, ordering and review of laboratory studies, ordering and review of radiographic studies, pulse oximetry and re-evaluation of patient's condition.    ED Course:  Patient was recognized to have an acute myocardial infarction over the anterior portion of the heart. Patient was initiated on nitroglycerin drip at 20 mics per minute. He was given 4 81 mg aspirin tablets. Patient received a 4000 unit bolus of heparin. Phone consultation was arranged with the cardiologist and the patient had risk factors and alternatives explained at the bedside for procedural intervention. Patient remained hemodynamically stable here in emergency department.   Assessment:  Acute anterior wall myocardial infarction     Plan:  Transferred to the heart catheterization lab           Daymon Larsen, MD 10/20/14 [phone]

## 2014-10-20 NOTE — Progress Notes (Signed)
74 y/o M s/p PCI with cardiology orders for bivalirudin to continue at 0.25 mg/kg/hr for 4 hours post-PCI. Entered order for bivalirudin to stop at 1430 as discussed with RN.

## 2014-10-20 NOTE — Progress Notes (Signed)
   10/20/14 0904  Clinical Encounter Type  Visited With Patient  Visit Type Initial  Referral From Nurse  Consult/Referral To Chaplain  Paged to [place] ED in time to see Pt leaving for Cath lab. Will follow up later. Burley Saver [phone]

## 2014-10-20 NOTE — Consult Note (Signed)
[name] at [place] NAME: [name]    MR#:  [id number]  DATE OF BIRTH:  [birth year]  DATE OF ADMISSION:  10/20/2014  PRIMARY CARE PHYSICIAN: DR Kym Groom ([facility])  REQUESTING/REFERRING PHYSICIAN: Dr. Fletcher Anon  CHIEF COMPLAINT:   Chief Complaint  Patient presents with  . Chest Pain    HISTORY OF PRESENT ILLNESS:  [name]  is a 74 y.o. male with a known history of DM 2, ex-smoker comes in emergency room complaining of mid substernal chest pain along with right shoulder pain nausea and diaphoresis. His EKG showed acute MI changes in the lateral leads. He was seen by Dr. Fletcher Anon took him to the catheter lab and underwent 2 stents placement in mid LAD and mid RCA. Patient is currently on nitro drip in the ICU Internal medicine is consulted for management of type 2 diabetes. Patient reports taking Lantus 65 units at bedtime and NovoLog 20 units 3 times a day with meals. His diabetes is managed by his primary care physician.   PAST MEDICAL HISTORY:   Past Medical History  Diagnosis Date  . Diabetes mellitus without complication ([place])     PAST SURGICAL HISTOIRY:   Past Surgical History  Procedure Laterality Date  . Cardiac catheterization N/A 10/20/2014    Procedure: Left Heart Cath and Coronary Angiography;  Surgeon: Wellington Hampshire, MD;  Location: [facility];  Service: Cardiovascular;  Laterality: N/A;  . Cardiac catheterization Right 10/20/2014    Procedure: Coronary Stent Intervention;  Surgeon: Wellington Hampshire, MD;  Location: [facility];  Service: Cardiovascular;  Laterality: Right;  LAD/RCA Stent placement    SOCIAL HISTORY:   Social History  Substance Use Topics  . Smoking status: Never Smoker   . Smokeless tobacco: Not on file  . Alcohol Use: No    FAMILY HISTORY:  No family history on file.  DRUG ALLERGIES:  No Known Allergies  REVIEW OF SYSTEMS:   Review of Systems  Constitutional:  Negative for fever, chills and weight loss.  HENT: Negative for ear discharge, ear pain and nosebleeds.   Eyes: Negative for blurred vision, pain and discharge.  Respiratory: Negative for sputum production, shortness of breath, wheezing and stridor.   Cardiovascular: Positive for chest pain. Negative for palpitations, orthopnea and PND.  Gastrointestinal: Negative for nausea, vomiting, abdominal pain and diarrhea.  Genitourinary: Negative for urgency and frequency.  Musculoskeletal: Negative for back pain and joint pain.  Neurological: Negative for sensory change, speech change, focal weakness and weakness.  Psychiatric/Behavioral: Negative for depression and hallucinations. The patient is not nervous/anxious.   All other systems reviewed and are negative.  MEDICATIONS AT HOME:   Prior to Admission medications   Medication Sig Start Date End Date Taking? Authorizing Provider  aspirin 81 MG tablet Take 81 mg by mouth daily.   Yes Historical Provider, MD  insulin aspart (NOVOLOG) 100 UNIT/ML injection Inject into the skin 3 (three) times daily before meals.    Historical Provider, MD  metFORMIN (GLUCOPHAGE) 500 MG tablet Take 500 mg by mouth 2 (two) times daily with a meal.    Historical Provider, MD      VITAL SIGNS:  Blood pressure 108/77, pulse 79, temperature 98.1 F (36.7 C), temperature source Oral, resp. rate 15, height 5\' 9"  (1.753 m), weight 72 kg (158 lb 11.7 oz), SpO2 95 %.  PHYSICAL EXAMINATION:  GENERAL:  74 y.o.-year-old patient lying in the bed with no acute distress.  EYES: Pupils equal, round, reactive to light and accommodation. No scleral icterus. Extraocular muscles intact.  HEENT: Head atraumatic, normocephalic. Oropharynx and nasopharynx clear.  NECK:  Supple, no jugular venous distention. No thyroid enlargement, no tenderness.  LUNGS: Normal breath sounds bilaterally, no wheezing, rales,rhonchi or crepitation. No use of accessory muscles of respiration.   CARDIOVASCULAR: S1, S2 normal. No murmurs, rubs, or gallops.  ABDOMEN: Soft, nontender, nondistended. Bowel sounds present. No organomegaly or mass.  EXTREMITIES: No pedal edema, cyanosis, or clubbing.  NEUROLOGIC: Cranial nerves II through XII are intact. Muscle strength 5/5 in all extremities. Sensation intact. Gait not checked.  PSYCHIATRIC: The patient is alert and oriented x 3.  SKIN: No obvious rash, lesion, or ulcer.   LABORATORY PANEL:   CBC  Recent Labs Lab 10/20/14 0859  WBC 11.2*  HGB 16.8  HCT 48.4  PLT 182   ------------------------------------------------------------------------------------------------------------------  Chemistries   Recent Labs Lab 10/20/14 0859  NA 138  K 3.6  CL 99*  CO2 27  GLUCOSE 315*  BUN 14  CREATININE 1.18  CALCIUM 10.9*   ------------------------------------------------------------------------------------------------------------------  Cardiac Enzymes  Recent Labs Lab 10/20/14 1232  TROPONINI >65.00*   IMPRESSION AND PLAN:   74 y.o. male with a known history of DM 2, ex-smoker comes in emergency room complaining of mid substernal chest pain along with right shoulder pain nausea and diaphoresis. His EKG showed acute MI changes in the lateral leads. Internal medicine was consulted for management of  1. Type 2 diabetes, uncontrolled We'll resume patient's home dose Lantus but at lower dose of 28 units as per suggestion by diabetes coordinator. Continue sliding scale insulin. If sugars continue to rise at insulin drip NovoLog 20 units 3 times a day with meals according to patient's home dosing Hold off on metformin since patient got IV contrast dye during the catheter. It can be resumed after 48 hours  2. Acute ST EMI Patient is status post PCI with stenting of mid RCA and mid LAD by Dr. Fletcher Anon Cardiac meds per cardiology  3. DVT prophylaxis teds SCD.  Thank you for the consult and will follow while patient is in house  . All the records are reviewed and case discussed with Consulting provider. Management plans discussed with the patient, family and they are in agreement.  CODE STATUS: Full  TOTAL TIME TAKING CARE OF THIS PATIENT: 45  minutes.    Suellyn Meenan M.D on 10/20/2014 at 3:38 PM  Between 7am to 6pm - Pager - [phone]  After 6pm go to [email or web address] - password [email or web address] [facility]  Office  [phone]  CC: Primary care Physician: Pcp Not In System

## 2014-10-20 NOTE — Progress Notes (Signed)
Inpatient Diabetes Program Recommendations  AACE/ADA: New Consensus Statement on Inpatient Glycemic Control (2015)  Target Ranges:  Prepandial:   less than 140 mg/dL      Peak postprandial:   less than 180 mg/dL (1-2 hours)      Critically ill patients:  140 - 180 mg/dL  Results for UZIAH, SORTER (MRN [id number]) as of 10/20/2014 12:02  Ref. Range 10/20/2014 10:59  Glucose-Capillary Latest Ref Range: 65-99 mg/dL 233 (H)   Results for PEARLY, APACHITO (MRN [id number]) as of 10/20/2014 12:02  Ref. Range 10/20/2014 08:59  Glucose Latest Ref Range: 65-99 mg/dL 315 (H)   Review of Glycemic Control  Diabetes history: DM2 Outpatient Diabetes medications: Home med list has Novolog TID with meals and Metformin 500 mg BID. However in reviewing chart, noted office note by Dr. Kym Groom on 09/11/14 that patient is prescribed Lantus 65 units QHS, Novolog 20 units TID with meals, Metformin 1000 mg daily with supper Current orders for Inpatient glycemic control: None  Inpatient Diabetes Program Recommendations: Insulin - Basal: Patient reports that he takes Lantus 65 units QHS as an outpatient. Please consider ordering Lantus 28 units QHS (based on 72 kg x 0.4 units).  Correction (SSI): Please consider ordering CBGs with Novolog correction scale ACHS. HgbA1C: Please consider ordering an A1C to evaluate glycemic control over the past 2-3 months.  NOTE: Talked with Mia, RN who confirmed with patient that he is indeed taking Lantus 65 units QHS as an outpatient.  Thanks, Barnie Alderman, RN, MSN, CCRN, CDE Diabetes Coordinator Inpatient Diabetes Program [phone] (Team Pager from [place] to [place]) [phone] ([facility] office) [phone] [facility] office) [phone] [facility] office)

## 2014-10-21 ENCOUNTER — Inpatient Hospital Stay (HOSPITAL_COMMUNITY)
Admit: 2014-10-21 | Discharge: 2014-10-21 | Disposition: A | Discharge status: Home / Self Care | Payer: Medicare Other | Attending: Cardiovascular Disease | Admitting: Cardiovascular Disease

## 2014-10-21 DIAGNOSIS — I2109 ST elevation (STEMI) myocardial infarction involving other coronary artery of anterior wall: Secondary | ICD-10-CM | POA: Diagnosis not present

## 2014-10-21 DIAGNOSIS — E785 Hyperlipidemia, unspecified: Secondary | ICD-10-CM | POA: Insufficient documentation

## 2014-10-21 DIAGNOSIS — R079 Chest pain, unspecified: Secondary | ICD-10-CM

## 2014-10-21 DIAGNOSIS — E1165 Type 2 diabetes mellitus with hyperglycemia: Secondary | ICD-10-CM

## 2014-10-21 DIAGNOSIS — E1159 Type 2 diabetes mellitus with other circulatory complications: Secondary | ICD-10-CM

## 2014-10-21 DIAGNOSIS — Z955 Presence of coronary angioplasty implant and graft: Secondary | ICD-10-CM | POA: Insufficient documentation

## 2014-10-21 LAB — BASIC METABOLIC PANEL
Anion gap: 6 (ref 5–15)
BUN: 21 mg/dL — AB (ref 6–20)
CHLORIDE: 106 mmol/L (ref 101–111)
CO2: 24 mmol/L (ref 22–32)
CREATININE: 1.24 mg/dL (ref 0.61–1.24)
Calcium: 8.9 mg/dL (ref 8.9–10.3)
GFR calc Af Amer: >60 mL/min (ref >60)
GFR calc non Af Amer: 56 mL/min — ABNORMAL LOW (ref >60)
Glucose, Bld: 200 mg/dL — ABNORMAL HIGH (ref 65–99)
Potassium: 3.3 mmol/L — ABNORMAL LOW (ref 3.5–5.1)
SODIUM: 136 mmol/L (ref 135–145)

## 2014-10-21 LAB — CBC
HCT: 36.6 % — ABNORMAL LOW (ref 40.0–52.0)
Hemoglobin: 13 g/dL (ref 13.0–18.0)
MCH: 31.1 pg (ref 26.0–34.0)
MCHC: 35.6 g/dL (ref 32.0–36.0)
MCV: 87.5 fL (ref 80.0–100.0)
PLATELETS: 137 10*3/uL — AB (ref 150–440)
RBC: 4.18 MIL/uL — ABNORMAL LOW (ref 4.40–5.90)
RDW: 13.3 % (ref 11.5–14.5)
WBC: 9 10*3/uL (ref 3.8–10.6)

## 2014-10-21 LAB — LIPID PANEL
Cholesterol: 102 mg/dL (ref 0–200)
HDL: 31 mg/dL — AB (ref >40)
LDL Cholesterol: 52 mg/dL (ref 0–99)
TRIGLYCERIDES: 93 mg/dL (ref <150)
Total CHOL/HDL Ratio: 3.3 RATIO
VLDL: 19 mg/dL (ref 0–40)

## 2014-10-21 LAB — HEPATIC FUNCTION PANEL
ALT: 28 U/L (ref 17–63)
AST: 91 U/L — ABNORMAL HIGH (ref 15–41)
Albumin: 3 g/dL — ABNORMAL LOW (ref 3.5–5.0)
Alkaline Phosphatase: 62 U/L (ref 38–126)
BILIRUBIN DIRECT: 0.2 mg/dL (ref 0.1–0.5)
BILIRUBIN INDIRECT: 0.5 mg/dL (ref 0.3–0.9)
Total Bilirubin: 0.7 mg/dL (ref 0.3–1.2)
Total Protein: 5.4 g/dL — ABNORMAL LOW (ref 6.5–8.1)

## 2014-10-21 LAB — GLUCOSE, CAPILLARY
GLUCOSE-CAPILLARY: 198 mg/dL — AB (ref 65–99)
GLUCOSE-CAPILLARY: 152 mg/dL — AB (ref 65–99)
GLUCOSE-CAPILLARY: 214 mg/dL — AB (ref 65–99)

## 2014-10-21 LAB — HEMOGLOBIN A1C: Hgb A1c MFr Bld: 8.1 % — ABNORMAL HIGH (ref 4.0–6.0)

## 2014-10-21 NOTE — Progress Notes (Signed)
Pt transferred from CCU to 2A room 256. Box verified and skin assessed with Gay Filler., RN. Pt oriented to room and resting comfortably with no complaints of pain.

## 2014-10-21 NOTE — Progress Notes (Signed)
Inpatient Diabetes Program Recommendations  AACE/ADA: New Consensus Statement on Inpatient Glycemic Control (2015)  Target Ranges:  Prepandial:   less than 140 mg/dL      Peak postprandial:   less than 180 mg/dL (1-2 hours)      Critically ill patients:  140 - 180 mg/dL   Review of Glycemic Control:  Results for ROMELL, WOLDEN (MRN [id number]) as of 10/21/2014 10:19  Ref. Range 10/20/2014 10:59 10/20/2014 16:14 10/20/2014 21:07 10/21/2014 07:19  Glucose-Capillary Latest Ref Range: 65-99 mg/dL 233 (H) 257 (H) 223 (H) 198 (H)    Consider increasing Lantus to 40 units q HS.  Also consider adding Novolog meal coverage 8 units tid with meals.  Thanks, Adah Perl, RN, BC-ADM Inpatient Diabetes Coordinator Pager [phone] (8a-5p)

## 2014-10-21 NOTE — Progress Notes (Signed)
Patient ID: [name], male   DOB: [birth year], 74 y.o.   MRN: [id number] [facility] PROGRESS NOTE  PCP: Pcp Not In System  HPI/Subjective: Patient states his chest pain went away at 2:30 in the morning. Feels okay now. Has been walking back and forth to the bathroom and feels okay.  Objective: Filed Vitals:   10/21/14 1109  BP: 127/56  Pulse: 58  Temp: 97.8 F (36.6 C)  Resp: 16    Filed Weights   10/20/14 0851 10/20/14 1100  Weight: 72.576 kg (160 lb) 72 kg (158 lb 11.7 oz)    ROS: Review of Systems  Constitutional: Negative for fever and chills.  Eyes: Negative for blurred vision.  Respiratory: Negative for cough and shortness of breath.   Cardiovascular: Negative for chest pain.  Gastrointestinal: Negative for nausea, vomiting, abdominal pain, diarrhea and constipation.  Genitourinary: Negative for dysuria.  Musculoskeletal: Negative for joint pain.  Neurological: Negative for dizziness and headaches.   Exam: Physical Exam  Constitutional: He is oriented to person, place, and time.  HENT:  Nose: No mucosal edema.  Mouth/Throat: No oropharyngeal exudate or posterior oropharyngeal edema.  Eyes: Conjunctivae, EOM and lids are normal. Pupils are equal, round, and reactive to light.  Neck: No JVD present. Carotid bruit is not present. No edema present. No thyroid mass and no thyromegaly present.  Cardiovascular: S1 normal and S2 normal.  Exam reveals no gallop.   No murmur heard. Pulses:      Dorsalis pedis pulses are 2+ on the right side, and 2+ on the left side.  Respiratory: No respiratory distress. He has no wheezes. He has no rhonchi. He has no rales.  GI: Soft. Bowel sounds are normal. There is no tenderness.  Musculoskeletal:       Right ankle: He exhibits no swelling.       Left ankle: He exhibits no swelling.  Left hand swelling  Lymphadenopathy:    He has no cervical adenopathy.  Neurological: He is alert and oriented to person,  place, and time. No cranial nerve deficit.  Skin: Skin is warm. No rash noted. Nails show no clubbing.  Psychiatric: He has a normal mood and affect.    Data Reviewed: Basic Metabolic Panel:  Recent Labs Lab 10/20/14 0859 10/21/14 0518  NA 138 136  K 3.6 3.3*  CL 99* 106  CO2 27 24  GLUCOSE 315* 200*  BUN 14 21*  CREATININE 1.18 1.24  CALCIUM 10.9* 8.9   Liver Function Tests:  Recent Labs Lab 10/21/14 0518  AST 91*  ALT 28  ALKPHOS 62  BILITOT 0.7  PROT 5.4*  ALBUMIN 3.0*   CBC:  Recent Labs Lab 10/20/14 0859 10/21/14 0518  WBC 11.2* 9.0  NEUTROABS 8.1*  --   HGB 16.8 13.0  HCT 48.4 36.6*  MCV 87.9 87.5  PLT 182 137*   Cardiac Enzymes:  Recent Labs Lab 10/20/14 1232  TROPONINI >65.00*    CBG:  Recent Labs Lab 10/20/14 1059 10/20/14 1614 10/20/14 2107 10/21/14 0719 10/21/14 1126  GLUCAP 233* 257* 223* 198* 152*    Recent Results (from the past 240 hour(s))  MRSA PCR Screening     Status: None   Collection Time: 10/20/14 10:55 AM  Result Value Ref Range Status   MRSA by PCR NEGATIVE NEGATIVE Final    Comment:        The GeneXpert MRSA Assay (FDA approved for NASAL specimens only), is one component of a comprehensive MRSA colonization  surveillance program. It is not intended to diagnose MRSA infection nor to guide or monitor treatment for MRSA infections.      Studies: No results found.  Scheduled Meds: . aspirin  81 mg Oral Daily  . atorvastatin  80 mg Oral q1800  . carvedilol  3.125 mg Oral BID WC  . insulin aspart  0-15 Units Subcutaneous TID WC  . insulin glargine  28 Units Subcutaneous QHS  . lisinopril  10 mg Oral Daily  . sodium chloride  3 mL Intravenous Q12H  . ticagrelor  90 mg Oral BID   Continuous Infusions: . nitroGLYCERIN Stopped (10/21/14 0800)    Assessment/Plan:  1. ST elevation myocardial infarction. Status post stents by cardiology. Aspirin, atorvastatin, Coreg and Ticagrelor ordered. 2. Type 2  diabetes mellitus with hyperglycemia. Patient states that he takes 65 units of Lantus at home. I'm hesitant on this dose since his sugars are trending better on 28 units. I will check a hemoglobin A1c. Pharmacist to look into his dose at home. 3. Essential hypertension- blood pressure stable on Coreg and lisinopril 4. Hyperlipidemia unspecified- atorvastatin  Code Status:     Code Status Orders        Start     Ordered   10/20/14 1029  Full code   Continuous     10/20/14 1038     Disposition Plan: Likely home tomorrow  Consultants:  Cardiology  Time spent: 25 minutes  [employer], [place] [facility]

## 2014-10-21 NOTE — Progress Notes (Signed)
Patient: [name] / Admit Date: 10/20/2014 / Date of Encounter: 10/21/2014, 8:02 AM   Subjective: No chest pain overnight, no arrhythmia. "sugars are up and down" at home Managed by [facility] in [place] Remote smoking  Review of Systems: ROS Review of Systems  Constitutional: Negative for fever, chills and weight loss.  HENT: Negative for ear discharge, ear pain and nosebleeds.  Eyes: Negative for blurred vision, pain and discharge.  Respiratory: Negative for sputum production, shortness of breath, wheezing and stridor.  Cardiovascular: no chest pain, Negative for palpitations, orthopnea and PND.  Gastrointestinal: Negative for nausea, vomiting, abdominal pain and diarrhea.  Genitourinary: Negative for urgency and frequency.  Musculoskeletal: Negative for back pain and joint pain.  Neurological: Negative for sensory change, speech change, focal weakness and weakness.  Psychiatric/Behavioral: Negative for depression and hallucinations. The patient is not nervous/anxious.  All other systems reviewed and are negative.  Objective: Telemetry: NSR Physical Exam: Blood pressure 115/67, pulse 58, temperature 98.8 F (37.1 C), temperature source Oral, resp. rate 14, height 5\' 9"  (1.753 m), weight 158 lb 11.7 oz (72 kg), SpO2 100 %. Body mass index is 23.43 kg/(m^2). General: Well developed, well nourished, in no acute distress. Head: Normocephalic, atraumatic, sclera non-icteric, no xanthomas, nares are without discharge. Neck: Negative for carotid bruits. JVP not elevated. Lungs: Clear bilaterally to auscultation without wheezes, rales, or rhonchi. Breathing is unlabored. Heart: RRR S1 S2 without murmurs, rubs, or gallops.  Abdomen: Soft, non-tender, non-distended with normoactive bowel sounds. No rebound/guarding. Extremities: No clubbing or cyanosis. No edema. Distal pedal pulses are 2+ and equal bilaterally. Neuro: Alert and oriented X 3. Moves all extremities  spontaneously. Psych:  Responds to questions appropriately with a normal affect.   Intake/Output Summary (Last 24 hours) at 10/21/14 0802 Last data filed at 10/21/14 0600  Gross per 24 hour  Intake 1645.7 ml  Output    400 ml  Net 1245.7 ml    Inpatient Medications:  . aspirin  81 mg Oral Daily  . atorvastatin  80 mg Oral q1800  . carvedilol  3.125 mg Oral BID WC  . insulin aspart  0-15 Units Subcutaneous TID WC  . insulin glargine  28 Units Subcutaneous QHS  . lisinopril  10 mg Oral Daily  . pneumococcal 23 valent vaccine  0.5 mL Intramuscular Tomorrow-1000  . sodium chloride  3 mL Intravenous Q12H  . ticagrelor  90 mg Oral BID   Infusions:  . nitroGLYCERIN 5 mcg/min (10/21/14 0600)    Labs:  Recent Labs  10/20/14 0859  NA 138  K 3.6  CL 99*  CO2 27  GLUCOSE 315*  BUN 14  CREATININE 1.18  CALCIUM 10.9*   No results for input(s): AST, ALT, ALKPHOS, BILITOT, PROT, ALBUMIN in the last 72 hours.  Recent Labs  10/20/14 0859 10/21/14 0518  WBC 11.2* 9.0  NEUTROABS 8.1*  --   HGB 16.8 13.0  HCT 48.4 36.6*  MCV 87.9 87.5  PLT 182 137*    Recent Labs  10/20/14 1232  TROPONINI >65.00*   Invalid input(s): POCBNP No results for input(s): HGBA1C in the last 72 hours.   Weights: Filed Weights   10/20/14 0851 10/20/14 1100  Weight: 160 lb (72.576 kg) 158 lb 11.7 oz (72 kg)     Radiology/Studies:  No results found.   Assessment and Plan  74 y.o. male with a known history of DM 2, ex-smoker comes in emergency room complaining of mid substernal chest pain along  with right shoulder pain nausea and diaphoresis. His EKG showed acute MI changes in the lateral leads.cath yesterday with DES x 2 to LAD and RCA, large MI  1. Acute STEMI  status post PCI with stenting of mid RCA and mid LAD by Dr. Fletcher Anon Troponin >65 ---echo pending to determine EF Would conitnue to trend troponin --would keep in hospital. High risk of arrhythmia after large MI ---long  discussion with the patient concerning risk factors --will need cardiac rehab  2. Type 2 diabetes, uncontrolled Management per medical service  Signed, Esmond Plants, MD [facility] 10/21/2014, 8:02 AM

## 2014-10-21 NOTE — Progress Notes (Signed)
*  PRELIMINARY RESULTS* Echocardiogram 2D Echocardiogram has been performed.  Laqueta Jean Hege 10/21/2014, 3:23 PM

## 2014-10-21 NOTE — Progress Notes (Signed)
Report called to Amado Nash RN on 2 A, pt aware that hes being transferred, calling family. No ss of distress noted.

## 2014-10-22 LAB — BASIC METABOLIC PANEL
ANION GAP: 8 (ref 5–15)
BUN: 21 mg/dL — AB (ref 6–20)
CHLORIDE: 107 mmol/L (ref 101–111)
CO2: 26 mmol/L (ref 22–32)
Calcium: 8.8 mg/dL — ABNORMAL LOW (ref 8.9–10.3)
Creatinine, Ser: 1.23 mg/dL (ref 0.61–1.24)
GFR calc Af Amer: >60 mL/min (ref >60)
GFR, EST NON AFRICAN AMERICAN: 57 mL/min — AB (ref >60)
Glucose, Bld: 263 mg/dL — ABNORMAL HIGH (ref 65–99)
POTASSIUM: 3.8 mmol/L (ref 3.5–5.1)
SODIUM: 141 mmol/L (ref 135–145)

## 2014-10-22 LAB — GLUCOSE, CAPILLARY: GLUCOSE-CAPILLARY: 170 mg/dL — AB (ref 65–99)

## 2014-10-22 LAB — TROPONIN I: TROPONIN I: 10.65 ng/mL — AB (ref <0.031)

## 2014-10-22 MED ORDER — TICAGRELOR 90 MG PO TABS: 90.0000 mg | ORAL_TABLET | Freq: Two times a day (BID) | ORAL | Status: DC

## 2014-10-22 MED ORDER — INSULIN GLARGINE 100 UNIT/ML ~~LOC~~ SOLN
40.0000 [IU] | Freq: Every day | SUBCUTANEOUS | Status: DC
  Filled 2014-10-22: qty 0.4

## 2014-10-22 MED ORDER — LANTUS 100 UNIT/ML ~~LOC~~ SOLN: 40.0000 [IU] | Freq: Every evening | SUBCUTANEOUS | Status: DC

## 2014-10-22 MED ORDER — NITROGLYCERIN 0.4 MG SL SUBL: 0.4000 mg | SUBLINGUAL_TABLET | SUBLINGUAL | Status: DC | PRN

## 2014-10-22 MED ORDER — INSULIN ASPART 100 UNIT/ML ~~LOC~~ SOLN: 8.0000 [IU] | Freq: Three times a day (TID) | SUBCUTANEOUS | Status: DC

## 2014-10-22 MED ORDER — ATORVASTATIN CALCIUM 80 MG PO TABS: 80.0000 mg | ORAL_TABLET | Freq: Every day | ORAL | Status: DC

## 2014-10-22 MED ORDER — LISINOPRIL 10 MG PO TABS: 10.0000 mg | ORAL_TABLET | Freq: Every day | ORAL | Status: DC

## 2014-10-22 MED ORDER — CARVEDILOL 3.125 MG PO TABS: 3.1250 mg | ORAL_TABLET | Freq: Two times a day (BID) | ORAL | Status: DC

## 2014-10-22 NOTE — Progress Notes (Signed)
Patient: [name] / Admit Date: 10/20/2014 / Date of Encounter: 10/22/2014, 8:26 AM   Subjective: No chest pain overnight, no arrhythmia. Remote smoking Ambulated in the room Labs from this AM pending  Review of Systems: ROS Review of Systems  Constitutional: Negative for fever, chills and weight loss.  HENT: Negative for ear discharge, ear pain and nosebleeds.  Eyes: Negative for blurred vision, pain and discharge.  Respiratory: Negative for sputum production, shortness of breath, wheezing and stridor.  Cardiovascular: no chest pain, Negative for palpitations, orthopnea and PND.  Gastrointestinal: Negative for nausea, vomiting, abdominal pain and diarrhea.  Genitourinary: Negative for urgency and frequency.  Musculoskeletal: Negative for back pain and joint pain.  Neurological: Negative for sensory change, speech change, focal weakness and weakness.  Psychiatric/Behavioral: Negative for depression and hallucinations. The patient is not nervous/anxious.  All other systems reviewed and are negative.  Objective: Telemetry: NSR Physical Exam: Blood pressure 124/58, pulse 67, temperature 97.8 F (36.6 C), temperature source Oral, resp. rate 20, height 5\' 9"  (1.753 m), weight 158 lb 11.7 oz (72 kg), SpO2 98 %. Body mass index is 23.43 kg/(m^2). General: Well developed, well nourished, in no acute distress. Head: Normocephalic, atraumatic, sclera non-icteric, no xanthomas, nares are without discharge. Neck: Negative for carotid bruits. JVP not elevated. Lungs: Clear bilaterally to auscultation without wheezes, rales, or rhonchi. Breathing is unlabored. Heart: RRR S1 S2 without murmurs, rubs, or gallops.  Abdomen: Soft, non-tender, non-distended with normoactive bowel sounds. No rebound/guarding. Extremities: No clubbing or cyanosis. No edema. Distal pedal pulses are 2+ and equal bilaterally. Neuro: Alert and oriented X 3. Moves all extremities spontaneously. Psych:   Responds to questions appropriately with a normal affect.   Intake/Output Summary (Last 24 hours) at 10/22/14 0826 Last data filed at 10/22/14 0445  Gross per 24 hour  Intake    358 ml  Output    850 ml  Net   -492 ml    Inpatient Medications:  . aspirin  81 mg Oral Daily  . atorvastatin  80 mg Oral q1800  . carvedilol  3.125 mg Oral BID WC  . insulin aspart  0-15 Units Subcutaneous TID WC  . insulin glargine  40 Units Subcutaneous QHS  . lisinopril  10 mg Oral Daily  . sodium chloride  3 mL Intravenous Q12H  . ticagrelor  90 mg Oral BID   Infusions:  . nitroGLYCERIN Stopped (10/21/14 0800)    Labs:  Recent Labs  10/20/14 0859 10/21/14 0518  NA 138 136  K 3.6 3.3*  CL 99* 106  CO2 27 24  GLUCOSE 315* 200*  BUN 14 21*  CREATININE 1.18 1.24  CALCIUM 10.9* 8.9    Recent Labs  10/21/14 0518  AST 91*  ALT 28  ALKPHOS 62  BILITOT 0.7  PROT 5.4*  ALBUMIN 3.0*    Recent Labs  10/20/14 0859 10/21/14 0518  WBC 11.2* 9.0  NEUTROABS 8.1*  --   HGB 16.8 13.0  HCT 48.4 36.6*  MCV 87.9 87.5  PLT 182 137*    Recent Labs  10/20/14 1232  TROPONINI >65.00*   Invalid input(s): POCBNP  Recent Labs  10/21/14 0518  HGBA1C 8.1*     Weights: Filed Weights   10/20/14 0851 10/20/14 1100  Weight: 160 lb (72.576 kg) 158 lb 11.7 oz (72 kg)     Radiology/Studies:  No results found.   Assessment and Plan  74 y.o. male with a known history of DM 2, ex-smoker  comes in emergency room complaining of mid substernal chest pain along with right shoulder pain nausea and diaphoresis. His EKG showed acute MI changes in the lateral leads.cath yesterday with DES x 2 to LAD and RCA, large MI  1. Acute STEMI  status post PCI with stenting of mid RCA and mid LAD by Dr. Fletcher Anon Troponin >65 on 10/17 Echo showing normal EF, anterior wall hypokinesis --discussion with the patient concerning risk factors Consider D/C today, labs pending (troponin, potassium)   2. Type 2  diabetes, uncontrolled Management per medical service Discussed diet with him  3) hypokalemia Will recheck this AM  Signed, Esmond Plants, MD [facility] 10/22/2014, 8:26 AM

## 2014-10-22 NOTE — Discharge Summary (Signed)
[facility] [facility] [place] NAME: [name]    MR#:  [id number]  DATE OF BIRTH:  21-Apr-1940  DATE OF ADMISSION:  10/20/2014 ADMITTING PHYSICIAN: Wellington Hampshire, MD  DATE OF DISCHARGE: 10/22/2014  PRIMARY CARE PHYSICIAN: [place]   ADMISSION DIAGNOSIS:  Chest Pain STEMI  DISCHARGE DIAGNOSIS:  Active Problems:   ST elevation myocardial infarction (STEMI) of anterior wall (HCC)   ST elevation (STEMI) myocardial infarction involving left anterior descending coronary artery (HCC)   S/P coronary artery stent placement   Uncontrolled type 2 diabetes mellitus with circulatory disorder ([place])   Hyperlipidemia   SECONDARY DIAGNOSIS:   Past Medical History  Diagnosis Date  . Diabetes mellitus without complication [facility])     HOSPITAL COURSE:   1. ST elevation Myocardial infarction. Patient brought urgently to the cardiac Cath Lab by Dr. Fletcher Anon cardiology and interventions with stents placed for ST elevation myocardial infarction. Please see procedure report. Patient did well post procedure with resolution of chest pain. EF 55% on echocardiogram. Patient on aspirin, Brilinta, Coreg, statin. Cardiac rehabilitation to be set up by cardiology.  2. Essential hypertension- blood pressure stable on Coreg and lisinopril 3. Type 2 diabetes with hyperglycemia- Lantus increased to 40 units subcutaneous injection nightly. Short acting insulin 8 units prior to meals. 4. Hyperlipidemia unspecified- cholesterol profile actually in a good range. Continue high dose Lipitor  DISCHARGE CONDITIONS:   Satisfactory  CONSULTS OBTAINED:  Treatment Team:  Fritzi Mandes, MD  DRUG ALLERGIES:  No Known Allergies  DISCHARGE MEDICATIONS:   Current Discharge Medication List    START taking these medications   Details  atorvastatin (LIPITOR) 80 MG tablet Take 1 tablet (80 mg total) by mouth daily at 6 PM. Qty: 30 tablet, Refills: 0    carvedilol (COREG)  3.125 MG tablet Take 1 tablet (3.125 mg total) by mouth 2 (two) times daily with a meal. Qty: 60 tablet, Refills: 0    lisinopril (PRINIVIL,ZESTRIL) 10 MG tablet Take 1 tablet (10 mg total) by mouth daily. Qty: 30 tablet, Refills: 0    nitroGLYCERIN (NITROSTAT) 0.4 MG SL tablet Place 1 tablet (0.4 mg total) under the tongue every 5 (five) minutes as needed for chest pain. Qty: 30 tablet, Refills: 0    ticagrelor (BRILINTA) 90 MG TABS tablet Take 1 tablet (90 mg total) by mouth 2 (two) times daily. Qty: 60 tablet, Refills: 0      CONTINUE these medications which have CHANGED   Details  insulin aspart (NOVOLOG) 100 UNIT/ML injection Inject 8 Units into the skin 3 (three) times daily before meals. Qty: 10 mL, Refills: 11    LANTUS 100 UNIT/ML injection Inject 0.4 mLs (40 Units total) into the skin every evening. Qty: 10 mL, Refills: 3      CONTINUE these medications which have NOT CHANGED   Details  aspirin 81 MG tablet Take 81 mg by mouth daily.      STOP taking these medications     metFORMIN (GLUCOPHAGE) 500 MG tablet          DISCHARGE INSTRUCTIONS:   Follow-up with Dr. Fletcher Anon 1 week Follow-up with PMD 2 weeks   If you experience worsening of your admission symptoms, develop shortness of breath, life threatening emergency, suicidal or homicidal thoughts you must seek medical attention immediately by calling 911 or calling your MD immediately  if symptoms less severe.  You Must read complete instructions/literature along with all the possible adverse reactions/side effects for  all the Medicines you take and that have been prescribed to you. Take any new Medicines after you have completely understood and accept all the possible adverse reactions/side effects.   Please note  You were cared for by a hospitalist during your hospital stay. If you have any questions about your discharge medications or the care you received while you were in the hospital after you are  discharged, you can call the unit and asked to speak with the hospitalist on call if the hospitalist that took care of you is not available. Once you are discharged, your primary care physician will handle any further medical issues. Please note that NO REFILLS for any discharge medications will be authorized once you are discharged, as it is imperative that you return to your primary care physician (or establish a relationship with a primary care physician if you do not have one) for your aftercare needs so that they can reassess your need for medications and monitor your lab values.    Today   CHIEF COMPLAINT:   Chief Complaint  Patient presents with  . Chest Pain    HISTORY OF PRESENT ILLNESS:  [name]  is a 74 y.o. male with a known history of diabetes presented with chest pain and found to have an ST elevation myocardial infarction   VITAL SIGNS:  Blood pressure 124/58, pulse 67, temperature 97.8 F (36.6 C), temperature source Oral, resp. rate 20, height 5\' 9"  (1.753 m), weight 72 kg (158 lb 11.7 oz), SpO2 98 %.    PHYSICAL EXAMINATION:  GENERAL:  74 y.o.-year-old patient lying in the bed with no acute distress.  EYES: Pupils equal, round, reactive to light and accommodation. No scleral icterus. Extraocular muscles intact.  HEENT: Head atraumatic, normocephalic. Oropharynx and nasopharynx clear.  NECK:  Supple, no jugular venous distention. No thyroid enlargement, no tenderness.  LUNGS: Normal breath sounds bilaterally, no wheezing, rales,rhonchi or crepitation. No use of accessory muscles of respiration.  CARDIOVASCULAR: S1, S2 normal. No murmurs, rubs, or gallops.  ABDOMEN: Soft, non-tender, non-distended. Bowel sounds present. No organomegaly or mass.  EXTREMITIES: No pedal edema, cyanosis, or clubbing.  NEUROLOGIC: Cranial nerves II through XII are intact. Muscle strength 5/5 in all extremities. Sensation intact. Gait not checked.  PSYCHIATRIC: The patient is alert  and oriented x 3.  SKIN: No obvious rash, lesion, or ulcer.   DATA REVIEW:   CBC  Recent Labs Lab 10/21/14 0518  WBC 9.0  HGB 13.0  HCT 36.6*  PLT 137*    Chemistries   Recent Labs Lab 10/21/14 0518  NA 136  K 3.3*  CL 106  CO2 24  GLUCOSE 200*  BUN 21*  CREATININE 1.24  CALCIUM 8.9  AST 91*  ALT 28  ALKPHOS 62  BILITOT 0.7    Cardiac Enzymes  Recent Labs Lab 10/20/14 1232  TROPONINI >65.00*    Microbiology Results  Results for orders placed or performed during the hospital encounter of 10/20/14  MRSA PCR Screening     Status: None   Collection Time: 10/20/14 10:55 AM  Result Value Ref Range Status   MRSA by PCR NEGATIVE NEGATIVE Final    Comment:        The GeneXpert MRSA Assay (FDA approved for NASAL specimens only), is one component of a comprehensive MRSA colonization surveillance program. It is not intended to diagnose MRSA infection nor to guide or monitor treatment for MRSA infections.     Management plans discussed with the patient, and cardiology  team and they are in agreement.  CODE STATUS:     Code Status Orders        Start     Ordered   10/20/14 1029  Full code   Continuous     10/20/14 1038      TOTAL TIME TAKING CARE OF THIS PATIENT: 35 minutes.    Loletha Grayer M.D on 10/22/2014 at 7:47 AM  Between 7am to 6pm - Pager - [phone]  After 6pm go to [email or web address] - password [email or web address] [place] [facility]  Office  [phone]  CC: Primary care physician; Dr. Kym Groom

## 2014-10-22 NOTE — Care Management Important Message (Signed)
Important Message  Patient Details  Name: [name] MRN: [id number] Date of Birth: [birth year]   Medicare Important Message Given:  Yes-second notification given    Juliann Pulse A Allmond 10/22/2014, 9:42 AM

## 2014-10-22 NOTE — Progress Notes (Signed)
DR Candis Musa was made aware of pt's high trop. Level of 10.65, no order at this time

## 2014-10-22 NOTE — Discharge Instructions (Addendum)
Heart Attack A heart attack (myocardial infarction, MI) causes damage to the heart that cannot be fixed. A heart attack often happens when a blood clot or other blockage cuts blood flow to the heart. When this happens, certain areas of the heart begin to die. This causes the pain you feel during a heart attack. HOME CARE  Take medicine as told by your doctor. You may need medicine to:  Keep your blood from clotting too easily.  Control your blood pressure.  Lower your cholesterol.  Control abnormal heart rhythms.  Change certain behaviors as told by your doctor. This may include:  Quitting smoking.  Being active.  Eating a heart-healthy diet. Ask your doctor for help with this diet.  Keeping a healthy weight.  Keeping your diabetes under control.  Lessening stress.  Limiting how much alcohol you drink. Do not take these medicines unless your doctor says that you can:  Nonsteroidal anti-inflammatory drugs (NSAIDs). These include:  Ibuprofen.  Naproxen.  Celecoxib.  Vitamin supplements that have vitamin A, vitamin E, or both.  Hormone therapy that contains estrogen with or without progestin. GET HELP RIGHT AWAY IF:  You have sudden chest discomfort.  You have sudden discomfort in your:  Arms.  Back.  Neck.  Jaw.  You have shortness of breath at any time.  You have sudden sweating or clammy skin.  You feel sick to your stomach (nauseous) or throw up (vomit).  You suddenly get light-headed or dizzy.  You feel your heart beating fast or skipping beats. These symptoms may be an emergency. Do not wait to see if the symptoms will go away. Get medical help right away. Call your local emergency services (911 in the [place] Do not drive yourself to the hospital.   This information is not intended to replace advice given to you by your health care provider. Make sure you discuss any questions you have with your health care provider.   Document Released:  06/21/2011 Document Revised: 05/06/2014 Document Reviewed: 02/22/2013 Elsevier Interactive Patient Education 2016 [place].  NURSING AND SOCIAL WORK  [phone].  AGENCY WILL BE CALLING AND SETTING UP APPOINTMENT.  CAL AGENCY IF DO NOT HEAR FROM AGENCY.  Give the Brilinta coupon the the pharmacy for your 30 day free med.  Your copay after that will be 7.40

## 2014-10-22 NOTE — Care Management Note (Signed)
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  At present, his step daughter is providing care for her half sister.  The daughter is able to perform her own bath but needs assist with ambulation and all other iadls.   Patient admitted with new stemi.  There are dietary concerns and patient is discharging on new cardiac meds.  He will benefit from home health nursing follow up for med management, cp assess and diet.  Will also benefit from social work to for accessing resources/services for his handicapped adult daughter.  Patient to discharge home today on new medication Brilinta. Contacted [facility] in [place] and patient's plan does not require prior auth. Provided patient with Brilinta 30 day coupon.  His copay will be 7.40.  Patient chose [facility] from agency list.  Referral called [facility] and requested soc for 10/20.  Have confirmed contact information and phone numbers.  Patients PCP  is Dr Billy Coast at [facility] in [place].  Patient is an agreement with discharge and does not wish to appeal.              Action/Plan:   Expected Discharge Date:                  Expected Discharge Plan:     In-House Referral:     Discharge planning Services     Post Acute Care Choice:    Choice offered to:     DME Arranged:    DME Agency:     HH Arranged:   [place] Agency:   [facility]  Status of Service:     Medicare Important Message Given:    Date Medicare IM Given:    Medicare IM give by:    Date Additional Medicare IM Given:    Additional Medicare Important Message give by:     If discussed at [place] of Stay Meetings, dates discussed:    Additional Comments:  Katrina Stack, RN 10/22/2014, 9:01 AM

## 2014-10-27 ENCOUNTER — Telehealth: Payer: Self-pay | Admitting: *Deleted

## 2014-10-27 NOTE — Telephone Encounter (Signed)
[facility] home health nurse Zenon Mayo [phone]) called and needs an order for [place] for nursing and physical therapy. Please call.

## 2014-10-28 ENCOUNTER — Telehealth: Payer: Self-pay

## 2014-10-28 NOTE — Telephone Encounter (Signed)
Nurse would like orders for home health. Nursing , physical therapy.jShe is aware pt has not been seen in clinic. Only in hospital.Please call.

## 2014-10-28 NOTE — Telephone Encounter (Signed)
S/w [place] who states she needs orders for nursing and PT for pt. Informed Leilaine that Dr. Fletcher Anon has not yet seen pt in clinic and she should contact pt PCP, Dr. Kym Groom. She states she thought Fletcher Anon was PCP. Apologized for phone call and states she will call Dr. Devona Konig office.

## 2014-11-25 ENCOUNTER — Encounter: Payer: Self-pay | Admitting: Nurse Practitioner

## 2014-11-25 ENCOUNTER — Ambulatory Visit (INDEPENDENT_AMBULATORY_CARE_PROVIDER_SITE_OTHER): Payer: Medicare Other | Admitting: Nurse Practitioner

## 2014-11-25 VITALS — BP 143/77 | HR 62 | Ht 69.0 in | Wt 172.4 lb

## 2014-11-25 DIAGNOSIS — E119 Type 2 diabetes mellitus without complications: Secondary | ICD-10-CM

## 2014-11-25 DIAGNOSIS — I2510 Atherosclerotic heart disease of native coronary artery without angina pectoris: Secondary | ICD-10-CM | POA: Diagnosis not present

## 2014-11-25 DIAGNOSIS — E11649 Type 2 diabetes mellitus with hypoglycemia without coma: Secondary | ICD-10-CM | POA: Insufficient documentation

## 2014-11-25 DIAGNOSIS — E785 Hyperlipidemia, unspecified: Secondary | ICD-10-CM | POA: Diagnosis not present

## 2014-11-25 DIAGNOSIS — I10 Essential (primary) hypertension: Secondary | ICD-10-CM | POA: Diagnosis not present

## 2014-11-25 DIAGNOSIS — I2109 ST elevation (STEMI) myocardial infarction involving other coronary artery of anterior wall: Secondary | ICD-10-CM

## 2014-11-25 MED ORDER — LISINOPRIL 20 MG PO TABS: 20.0000 mg | ORAL_TABLET | Freq: Every day | ORAL | Status: DC

## 2014-11-25 MED ORDER — NITROGLYCERIN 0.4 MG SL SUBL: 0.4000 mg | SUBLINGUAL_TABLET | SUBLINGUAL | Status: DC | PRN

## 2014-11-25 MED ORDER — TICAGRELOR 90 MG PO TABS: 90.0000 mg | ORAL_TABLET | Freq: Two times a day (BID) | ORAL | Status: DC

## 2014-11-25 MED ORDER — ATORVASTATIN CALCIUM 80 MG PO TABS: 80.0000 mg | ORAL_TABLET | Freq: Every day | ORAL | Status: DC

## 2014-11-25 MED ORDER — NITROGLYCERIN 0.4 MG SL SUBL: 0.4000 mg | SUBLINGUAL_TABLET | SUBLINGUAL | Status: AC | PRN

## 2014-11-25 MED ORDER — CARVEDILOL 3.125 MG PO TABS: 3.1250 mg | ORAL_TABLET | Freq: Two times a day (BID) | ORAL | Status: DC

## 2014-11-25 MED ORDER — CARVEDILOL 3.125 MG PO TABS
3.1250 mg | ORAL_TABLET | Freq: Two times a day (BID) | ORAL | Status: AC
Start: 2014-11-25 — End: ?

## 2014-11-25 NOTE — Progress Notes (Signed)
Patient Name: [name] Date of Encounter: 11/25/2014  Primary Care Provider:  Valera Castle, MD Primary Cardiologist:  Jerilynn Mages. Fletcher Anon, MD   Chief Complaint  74 year old male status post recent anterior MI who presents for follow-up.  Past Medical History   Past Medical History  Diagnosis Date  . Diabetes mellitus without complication ([place])   . CAD (coronary artery disease)     a. 10/2014 Ant STEMI/PCI: LM nl, LAD 154m (2.75x38 Xience DES) - R->L collats, D1 60, D2 90ost, LCX 20ost, OM1/2/3 min irregs, RCA 8m (2.75x15 Xience DES), 50d, RPDA small;  b. 10/2014 Echo: EF 55-60%, mod ant/ap HK, Gr1 DD.  Marland Kitchen Essential hypertension   . Hyperlipidemia   . BPH (benign prostatic hyperplasia)   . Osteoarthritis    Past Surgical History  Procedure Laterality Date  . Cardiac catheterization N/A 10/20/2014    Procedure: Left Heart Cath and Coronary Angiography;  Surgeon: Wellington Hampshire, MD;  Location: [place] [facility];  Service: Cardiovascular;  Laterality: N/A;  . Cardiac catheterization Right 10/20/2014    Procedure: Coronary Stent Intervention;  Surgeon: Wellington Hampshire, MD;  Location: [place] [facility];  Service: Cardiovascular;  Laterality: Right;  LAD/RCA Stent placement    Allergies  No Known Allergies  HPI  74 year old male with the above past medical history. Has a history of diabetes and hypertension and was recently admitted to [facility] in the setting of chest pain and anterior ST elevation. He underwent emergent catheterization revealing occlusive disease of the LAD as well as severe mid right coronary artery disease. Both areas were successfully stented using drug eluting stents. Echocardiography showed normal LV function. Since his discharge, he has done well. He works at a [employer] in [place]. 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 He is very active and walks several miles as part  of his job and sometimes will walk 5 miles into town to the supermarket and back. He has had no recurrence of chest pain, dyspnea, PND, orthopnea, dizziness, syncope, edema, or early satiety. He has been having some issues with hypoglycemia with sugars down to the 50s on a regular basis. Is currently taking Lantus 65 units daily, metformin 500 mg twice a day, and NovoLog 20 units 3 times a day.  Home Medications  Prior to Admission medications   Medication Sig Start Date End Date Taking? Authorizing Provider  aspirin 81 MG tablet Take 81 mg by mouth daily.   Yes Historical Provider, MD  atorvastatin (LIPITOR) 80 MG tablet Take 1 tablet (80 mg total) by mouth daily at 6 PM. 11/25/14  Yes Rogelia Mire, NP  carvedilol (COREG) 3.125 MG tablet Take 1 tablet (3.125 mg total) by mouth 2 (two) times daily with a meal. 11/25/14  Yes Rogelia Mire, NP  insulin aspart (NOVOLOG) 100 UNIT/ML injection Inject 8 Units into the skin 3 (three) times daily before meals. 10/22/14  Yes Richard Leslye Peer, MD  LANTUS 100 UNIT/ML injection Inject 0.4 mLs (40 Units total) into the skin every evening. 10/22/14  Yes Richard Leslye Peer, MD  lisinopril (PRINIVIL,ZESTRIL) 20 MG tablet Take 1 tablet (20 mg total) by mouth daily. 11/25/14  Yes Rogelia Mire, NP  metFORMIN (GLUCOPHAGE) 500 MG tablet Take 1 tablet by mouth 2 (two) times daily. 11/04/14 10/11/15 Yes Historical Provider, MD  nitroGLYCERIN (NITROSTAT) 0.4 MG SL tablet Place 1 tablet (0.4 mg total) under the tongue every  5 (five) minutes as needed for chest pain. 11/25/14  Yes Rogelia Mire, NP  ticagrelor (BRILINTA) 90 MG TABS tablet Take 1 tablet (90 mg total) by mouth 2 (two) times daily. 11/25/14  Yes Rogelia Mire, NP    Review of Systems  As above, he is doing well without chest pain, dyspnea, PND, orthopnea, dizziness, significant edema, or early satiety. He has been having issues with hypoglycemia.  All other systems reviewed and are  otherwise negative except as noted above.  Physical Exam  VS:  BP 143/77 mmHg  Pulse 62  Ht 5\' 9"  (1.753 m)  Wt 172 lb 6.4 oz (78.2 kg)  BMI 25.45 kg/m2  SpO2 98% , BMI Body mass index is 25.45 kg/(m^2). GEN: Well nourished, well developed, in no acute distress. HEENT: normal. Neck: Supple, no JVD, carotid bruits, or masses. Cardiac: RRR, no murmurs, rubs, or gallops. No clubbing, cyanosis, edema.  Radials/DP/PT 2+ and equal bilaterally.  right wrist catheterization site without bleeding, bruit, or hematoma.  Respiratory:  Respirations regular and unlabored, clear to auscultation bilaterally. GI: Soft, nontender, nondistended, BS + x 4. MS: no deformity or atrophy. Skin: warm and dry, no rash. Neuro:  Strength and sensation are intact. Psych: Normal affect.  Accessory Clinical Findings  ECG - regular sinus rhythm, 62, anterior infarct with persistent anterolateral T-wave inversion-unchanged from prior ECG.  Assessment & Plan  1.  Anterior ST segment elevation myocardial infarction, subsequent episode of care/coronary artery disease: Status post recent admission for myocardial infarction with subsequent drug-eluting stent placement to the LAD and right coronary artery. He has done well without recurrence of chest pain or dyspnea. He is active around the house and also at work. We discussed cardiac rehabilitation where he says he is not interested. He remains on aspirin, Brilinta, statin, beta blocker, and ACE inhibitor therapy. We have refilled these medications today.  2. Essential hypertension: Blood pressure is elevated today. He says at home he often runs in the 150s to 160s. I've increase his lisinopril to 20 mg daily. He will continue to follow his blood pressures at home.  3. Hyperlipidemia: He is on high potency statin therapy. He is fasting this morning and we will check lipids and LFTs.  4. Insulin-dependent diabetes mellitus/hypoglycemia: Patient is on high-dose insulin and  has been expressing frequent hypoglycemia with sugars in the 30s to 50s. I've asked him to reduce his NovoLog to 14 units 3 times a day and follow-up with primary care as soon as possible for further adjustment if necessary. Patient will continue to follow his sugars closely at home.  5. Disposition: Follow-up lipids and LFTs today. Follow-up with Dr. Fletcher Anon in 4 months or sooner if necessary.   Murray Hodgkins, NP 11/25/2014, 8:51 AM

## 2014-11-25 NOTE — Patient Instructions (Signed)
Medication Instructions:  Your physician has recommended you make the following change in your medication:  DECREASE novolog to 14 units three times per day INCREASE lisinopril to 20 mg once per day   Labwork: Fasting lipid and liver profile today  Testing/Procedures: none  Follow-Up: Your physician recommends that you schedule a follow-up appointment in: 4 months with Dr. Fletcher Anon.    Any Other Special Instructions Will Be Listed Below (If Applicable).     If you need a refill on your cardiac medications before your next appointment, please call your pharmacy.

## 2014-11-26 LAB — LIPID PANEL
CHOL/HDL RATIO: 1.9 ratio (ref 0.0–5.0)
Cholesterol, Total: 82 mg/dL — ABNORMAL LOW (ref 100–199)
HDL: 44 mg/dL (ref >39)
LDL Calculated: 28 mg/dL (ref 0–99)
Triglycerides: 52 mg/dL (ref 0–149)
VLDL Cholesterol Cal: 10 mg/dL (ref 5–40)

## 2014-11-26 LAB — HEPATIC FUNCTION PANEL
ALK PHOS: 76 IU/L (ref 39–117)
ALT: 18 IU/L (ref 0–44)
AST: 33 IU/L (ref 0–40)
Albumin: 4.2 g/dL (ref 3.5–4.8)
BILIRUBIN TOTAL: 0.7 mg/dL (ref 0.0–1.2)
BILIRUBIN, DIRECT: 0.23 mg/dL (ref 0.00–0.40)
Total Protein: 6.5 g/dL (ref 6.0–8.5)

## 2014-12-15 ENCOUNTER — Encounter: Payer: Medicare Other | Admitting: Cardiovascular Disease

## 2015-03-26 ENCOUNTER — Encounter: Payer: Self-pay | Admitting: Cardiovascular Disease

## 2015-03-26 ENCOUNTER — Ambulatory Visit (INDEPENDENT_AMBULATORY_CARE_PROVIDER_SITE_OTHER): Payer: Medicare HMO | Admitting: Cardiovascular Disease

## 2015-03-26 VITALS — BP 140/62 | HR 70 | Ht 69.0 in | Wt 171.8 lb

## 2015-03-26 DIAGNOSIS — E785 Hyperlipidemia, unspecified: Secondary | ICD-10-CM | POA: Diagnosis not present

## 2015-03-26 DIAGNOSIS — I2510 Atherosclerotic heart disease of native coronary artery without angina pectoris: Secondary | ICD-10-CM

## 2015-03-26 DIAGNOSIS — I10 Essential (primary) hypertension: Secondary | ICD-10-CM | POA: Diagnosis not present

## 2015-03-26 MED ORDER — LISINOPRIL 40 MG PO TABS: 40.0000 mg | ORAL_TABLET | Freq: Every day | ORAL | Status: DC

## 2015-03-26 MED ORDER — ATORVASTATIN CALCIUM 40 MG PO TABS: 40.0000 mg | ORAL_TABLET | Freq: Every day | ORAL | Status: DC

## 2015-03-26 NOTE — Progress Notes (Signed)
Cardiology Office Note   Date:  03/26/2015   ID:  CHETT NISHIYAMA, DOB 1940-02-21, MRN [id number]  PCP:  Valera Castle, MD  Cardiologist:   Kathlyn Sacramento, MD   Chief Complaint  Patient presents with  . other    4 month f/u no complaints. Meds reviewed verbally with pt.      History of Present Illness: [name] is a 75 y.o. male who presents for A follow-up visit regarding coronary artery disease .  Has a history of diabetes and hypertension. He presented in October 2016 with anterior ST elevation myocardial infarction. Emergent cardiac catheterization showed occluded mid LAD as well as severe mid right coronary artery disease. Both areas were successfully stented using drug eluting stents. Echocardiography showed normal LV function.  Since his discharge, he has done well. He works at a [employer] in [employer].  He denies any chest pain or shortness of breath. He is taking his medications regularly with no reported side effects.   Past Medical History  Diagnosis Date  . Diabetes mellitus without complication ([place])   . CAD (coronary artery disease)     a. 10/2014 Ant STEMI/PCI: LM nl, LAD 13m (2.75x38 Xience DES) - R->L collats, D1 60, D2 90ost, LCX 20ost, OM1/2/3 min irregs, RCA 37m (2.75x15 Xience DES), 50d, RPDA small;  b. 10/2014 Echo: EF 55-60%, mod ant/ap HK, Gr1 DD.  Marland Kitchen Essential hypertension   . Hyperlipidemia   . BPH (benign prostatic hyperplasia)   . Osteoarthritis     Past Surgical History  Procedure Laterality Date  . Cardiac catheterization N/A 10/20/2014    Procedure: Left Heart Cath and Coronary Angiography;  Surgeon: Wellington Hampshire, MD;  Location: [place] [facility];  Service: Cardiovascular;  Laterality: N/A;  . Cardiac catheterization Right 10/20/2014    Procedure: Coronary Stent Intervention;  Surgeon: Wellington Hampshire, MD;  Location: [place] [facility];  Service: Cardiovascular;  Laterality: Right;  LAD/RCA Stent  placement     Current Outpatient Prescriptions  Medication Sig Dispense Refill  . aspirin 81 MG tablet Take 81 mg by mouth daily.    Marland Kitchen atorvastatin (LIPITOR) 80 MG tablet Take 1 tablet (80 mg total) by mouth daily at 6 PM. 30 tablet 5  . carvedilol (COREG) 3.125 MG tablet Take 1 tablet (3.125 mg total) by mouth 2 (two) times daily with a meal. 60 tablet 5  . insulin aspart (NOVOLOG) 100 UNIT/ML injection Inject 8 Units into the skin 3 (three) times daily before meals. 10 mL 11  . LANTUS 100 UNIT/ML injection Inject 0.4 mLs (40 Units total) into the skin every evening. 10 mL 3  . lisinopril (PRINIVIL,ZESTRIL) 20 MG tablet Take 1 tablet (20 mg total) by mouth daily. 30 tablet 5  . metFORMIN (GLUCOPHAGE) 500 MG tablet Take 1 tablet by mouth 2 (two) times daily.    . nitroGLYCERIN (NITROSTAT) 0.4 MG SL tablet Place 1 tablet (0.4 mg total) under the tongue every 5 (five) minutes as needed for chest pain. 30 tablet 2  . ticagrelor (BRILINTA) 90 MG TABS tablet Take 1 tablet (90 mg total) by mouth 2 (two) times daily. 60 tablet 5   No current facility-administered medications for this visit.    Allergies:   Review of patient's allergies indicates no known allergies.    Social History:  The patient  reports that he has never smoked. He does not have any smokeless tobacco history on file. He reports that he does  not drink alcohol or use illicit drugs.      ROS:  Please see the history of present illness.   Otherwise, review of systems are positive for none.   All other systems are reviewed and negative.    PHYSICAL EXAM: VS:  BP 140/62 mmHg  Pulse 70  Ht 5\' 9"  (1.753 m)  Wt 171 lb 12 oz (77.905 kg)  BMI 25.35 kg/m2 , BMI Body mass index is 25.35 kg/(m^2). GEN: Well nourished, well developed, in no acute distress HEENT: normal Neck: no JVD, carotid bruits, or masses Cardiac: RRR; no murmurs, rubs, or gallops,no edema  Respiratory:  clear to auscultation bilaterally, normal work of  breathing GI: soft, nontender, nondistended, + BS MS: no deformity or atrophy Skin: warm and dry, no rash Neuro:  Strength and sensation are intact Psych: euthymic mood, full affect   EKG:  EKG is not ordered today.    Recent Labs: 10/21/2014: Hemoglobin 13.0; Platelets 137* 10/22/2014: BUN 21*; Creatinine, Ser 1.23; Potassium 3.8; Sodium 141 11/25/2014: ALT 18    Lipid Panel    Component Value Date/Time   CHOL 82* 11/25/2014 0911   CHOL 102 10/21/2014 0518   TRIG 52 11/25/2014 0911   HDL 44 11/25/2014 0911   HDL 31* 10/21/2014 0518   CHOLHDL 1.9 11/25/2014 0911   CHOLHDL 3.3 10/21/2014 0518   VLDL 19 10/21/2014 0518   LDLCALC 28 11/25/2014 0911   LDLCALC 52 10/21/2014 0518      Wt Readings from Last 3 Encounters:  03/26/15 171 lb 12 oz (77.905 kg)  11/25/14 172 lb 6.4 oz (78.2 kg)  10/20/14 158 lb 11.7 oz (72 kg)        ASSESSMENT AND PLAN:  1.  Coronary artery disease involving native coronary arteries without angina: The patient is doing very well with no recurrent anginal symptoms. Continue aggressive medical therapy and dual antiplatelet therapy for at least until October 2017. At that point, I will most likely decreased the dose of Brilinta to 60 mg twice daily.  2. Essential hypertension: Blood pressure is continues to be mildly elevated. Thus, I elected to increase the dose of lisinopril to 40 mg once daily.  3. Hyperlipidemia: Lipid profile showed that his LDL was down to 28. Thus, I decreased the dose of atorvastatin to 40 mg once daily.  4. Diabetes mellitus: Managed by primary care physician.    Disposition:   FU with me in 6 months  Signed,  Kathlyn Sacramento, MD  03/26/2015 1:45 PM    [facility]

## 2015-03-26 NOTE — Patient Instructions (Signed)
Medication Instructions:  Your physician has recommended you make the following change in your medication:  DECREASE atorvastatin to 40mg  daily INCREASE lisinopril 40mg  daily.   Labwork: none  Testing/Procedures: none  Follow-Up: Your physician wants you to follow-up in: six months with Dr. Fletcher Anon.  You will receive a reminder letter in the mail two months in advance. If you don't receive a letter, please call our office to schedule the follow-up appointment.   Any Other Special Instructions Will Be Listed Below (If Applicable).     If you need a refill on your cardiac medications before your next appointment, please call your pharmacy.

## 2015-03-27 ENCOUNTER — Ambulatory Visit: Payer: Medicare Other | Admitting: Cardiovascular Disease

## 2015-07-15 ENCOUNTER — Other Ambulatory Visit: Payer: Self-pay | Admitting: *Deleted

## 2015-07-15 MED ORDER — LISINOPRIL 40 MG PO TABS: 40.0000 mg | ORAL_TABLET | Freq: Every day | ORAL | Status: DC

## 2015-07-15 NOTE — Telephone Encounter (Signed)
Requested Prescriptions   Signed Prescriptions Disp Refills  . lisinopril (PRINIVIL,ZESTRIL) 40 MG tablet 90 tablet 3    Sig: Take 1 tablet (40 mg total) by mouth daily.    Authorizing Provider: Kathlyn Sacramento A    Ordering User: Britt Bottom

## 2015-07-20 ENCOUNTER — Emergency Department: Payer: Medicare Other

## 2015-07-20 ENCOUNTER — Encounter: Payer: Self-pay | Admitting: *Deleted

## 2015-07-20 ENCOUNTER — Emergency Department
Admission: EM | Admit: 2015-07-20 | Discharge: 2015-07-20 | Disposition: A | Discharge status: Home / Self Care | Payer: Medicare Other | Attending: Student | Admitting: Student

## 2015-07-20 DIAGNOSIS — E785 Hyperlipidemia, unspecified: Secondary | ICD-10-CM | POA: Insufficient documentation

## 2015-07-20 DIAGNOSIS — I10 Essential (primary) hypertension: Secondary | ICD-10-CM | POA: Diagnosis not present

## 2015-07-20 DIAGNOSIS — R52 Pain, unspecified: Secondary | ICD-10-CM

## 2015-07-20 DIAGNOSIS — K859 Acute pancreatitis without necrosis or infection, unspecified: Secondary | ICD-10-CM | POA: Insufficient documentation

## 2015-07-20 DIAGNOSIS — E1165 Type 2 diabetes mellitus with hyperglycemia: Secondary | ICD-10-CM | POA: Insufficient documentation

## 2015-07-20 DIAGNOSIS — Z7984 Long term (current) use of oral hypoglycemic drugs: Secondary | ICD-10-CM | POA: Diagnosis not present

## 2015-07-20 DIAGNOSIS — Z7982 Long term (current) use of aspirin: Secondary | ICD-10-CM | POA: Diagnosis not present

## 2015-07-20 DIAGNOSIS — Z794 Long term (current) use of insulin: Secondary | ICD-10-CM | POA: Diagnosis not present

## 2015-07-20 DIAGNOSIS — R739 Hyperglycemia, unspecified: Secondary | ICD-10-CM

## 2015-07-20 DIAGNOSIS — R1084 Generalized abdominal pain: Secondary | ICD-10-CM

## 2015-07-20 DIAGNOSIS — I2510 Atherosclerotic heart disease of native coronary artery without angina pectoris: Secondary | ICD-10-CM | POA: Insufficient documentation

## 2015-07-20 LAB — URINALYSIS COMPLETE WITH MICROSCOPIC (ARMC ONLY)
Bacteria, UA: NONE SEEN
Bilirubin Urine: NEGATIVE
Hgb urine dipstick: NEGATIVE
KETONES UR: NEGATIVE mg/dL
Leukocytes, UA: NEGATIVE
Nitrite: NEGATIVE
PROTEIN: NEGATIVE mg/dL
Specific Gravity, Urine: 1.02 (ref 1.005–1.030)
pH: 5 (ref 5.0–8.0)

## 2015-07-20 LAB — HEPATIC FUNCTION PANEL
ALBUMIN: 4.1 g/dL (ref 3.5–5.0)
ALT: 15 U/L — ABNORMAL LOW (ref 17–63)
AST: 17 U/L (ref 15–41)
Alkaline Phosphatase: 72 U/L (ref 38–126)
BILIRUBIN TOTAL: 0.8 mg/dL (ref 0.3–1.2)
Bilirubin, Direct: 0.1 mg/dL (ref 0.1–0.5)
Indirect Bilirubin: 0.7 mg/dL (ref 0.3–0.9)
Total Protein: 8 g/dL (ref 6.5–8.1)

## 2015-07-20 LAB — BASIC METABOLIC PANEL
ANION GAP: 10 (ref 5–15)
BUN: 43 mg/dL — ABNORMAL HIGH (ref 6–20)
CALCIUM: 9.3 mg/dL (ref 8.9–10.3)
CO2: 23 mmol/L (ref 22–32)
Chloride: 98 mmol/L — ABNORMAL LOW (ref 101–111)
Creatinine, Ser: 1.33 mg/dL — ABNORMAL HIGH (ref 0.61–1.24)
GFR, EST AFRICAN AMERICAN: 60 mL/min — AB (ref >60)
GFR, EST NON AFRICAN AMERICAN: 51 mL/min — AB (ref >60)
Glucose, Bld: 436 mg/dL — ABNORMAL HIGH (ref 65–99)
Potassium: 4.6 mmol/L (ref 3.5–5.1)
Sodium: 131 mmol/L — ABNORMAL LOW (ref 135–145)

## 2015-07-20 LAB — CBC
HCT: 43.7 % (ref 40.0–52.0)
HEMOGLOBIN: 15 g/dL (ref 13.0–18.0)
MCH: 31.1 pg (ref 26.0–34.0)
MCHC: 34.5 g/dL (ref 32.0–36.0)
MCV: 90.4 fL (ref 80.0–100.0)
Platelets: 190 10*3/uL (ref 150–440)
RBC: 4.83 MIL/uL (ref 4.40–5.90)
RDW: 13.2 % (ref 11.5–14.5)
WBC: 6.8 10*3/uL (ref 3.8–10.6)

## 2015-07-20 LAB — LIPASE, BLOOD: Lipase: 80 U/L — ABNORMAL HIGH (ref 11–51)

## 2015-07-20 LAB — GLUCOSE, CAPILLARY
GLUCOSE-CAPILLARY: 394 mg/dL — AB (ref 65–99)
GLUCOSE-CAPILLARY: 355 mg/dL — AB (ref 65–99)

## 2015-07-20 LAB — TROPONIN I

## 2015-07-20 MED ORDER — SODIUM CHLORIDE 0.9 % IV BOLUS (SEPSIS)
1000.0000 mL | Freq: Once | INTRAVENOUS | Status: AC
Start: 2015-07-20 — End: 2015-07-20
  Administered 2015-07-20: 1000 mL via INTRAVENOUS

## 2015-07-20 MED ORDER — INSULIN ASPART 100 UNIT/ML ~~LOC~~ SOLN
6.0000 [IU] | Freq: Once | SUBCUTANEOUS | Status: AC
  Administered 2015-07-20: 6 [IU] via INTRAVENOUS
  Filled 2015-07-20: qty 6

## 2015-07-20 MED ORDER — ONDANSETRON HCL 4 MG/2ML IJ SOLN
4.0000 mg | Freq: Once | INTRAMUSCULAR | Status: AC
  Administered 2015-07-20: 4 mg via INTRAVENOUS
  Filled 2015-07-20: qty 2

## 2015-07-20 MED ORDER — ONDANSETRON 4 MG PO TBDP: 4.0000 mg | ORAL_TABLET | Freq: Three times a day (TID) | ORAL | Status: DC | PRN

## 2015-07-20 NOTE — ED Provider Notes (Addendum)
[facility] Emergency Department Provider Note   ____________________________________________  Time seen: Approximately 5:56 PM  I have reviewed the triage vital signs and the nursing notes.   HISTORY  Chief Complaint Hyperglycemia    HPI [name] is a 75 y.o. male with history of diabetes, hypertension, hyperlipidemia, coronary artery disease status post stents who presents for evaluation of hyperglycemia in the setting of diffuse abdominal pain, nausea, vomiting, sore throat, cough and fever over the past 3-4 days, gradual onset, constant, severe, no modifying factors. Patient reports that he has had the above symptoms for several days, he has also had chest pain with cough only. He was seen at his primary care doctor's office today and they checked his blood sugar and told them that it was too high to read so they sent him to the emergency department for IV fluids and insulin. He has not taken any of his insulin today or other medications. No shortness of breath. No pain or burning with urination. He has a history of constipation and his last bowel movement was 3 days ago. He reports that this is not uncommon for him.   Past Medical History  Diagnosis Date  . Diabetes mellitus without complication ([place])   . CAD (coronary artery disease)     a. 10/2014 Ant STEMI/PCI: LM nl, LAD 153m (2.75x38 Xience DES) - R->L collats, D1 60, D2 90ost, LCX 20ost, OM1/2/3 min irregs, RCA 70m (2.75x15 Xience DES), 50d, RPDA small;  b. 10/2014 Echo: EF 55-60%, mod ant/ap HK, Gr1 DD.  Marland Kitchen Essential hypertension   . Hyperlipidemia   . BPH (benign prostatic hyperplasia)   . Osteoarthritis     Patient Active Problem List   Diagnosis Date Noted  . CAD (coronary artery disease)   . Essential hypertension   . Diabetes mellitus without complication ([place])   . S/P coronary artery stent placement   . Uncontrolled type 2 diabetes mellitus with circulatory disorder ([place])   .  Hyperlipidemia   . ST elevation myocardial infarction (STEMI) of anterior wall ([place]) 10/20/2014  . ST elevation (STEMI) myocardial infarction involving left anterior descending coronary artery [facility])     Past Surgical History  Procedure Laterality Date  . Cardiac catheterization N/A 10/20/2014    Procedure: Left Heart Cath and Coronary Angiography;  Surgeon: Wellington Hampshire, MD;  Location: [place] [facility];  Service: Cardiovascular;  Laterality: N/A;  . Cardiac catheterization Right 10/20/2014    Procedure: Coronary Stent Intervention;  Surgeon: Wellington Hampshire, MD;  Location: [place] [facility];  Service: Cardiovascular;  Laterality: Right;  LAD/RCA Stent placement    Current Outpatient Rx  Name  Route  Sig  Dispense  Refill  . aspirin 81 MG tablet   Oral   Take 81 mg by mouth daily.         Marland Kitchen atorvastatin (LIPITOR) 40 MG tablet   Oral   Take 1 tablet (40 mg total) by mouth daily at 6 PM.   30 tablet   6   . carvedilol (COREG) 3.125 MG tablet   Oral   Take 1 tablet (3.125 mg total) by mouth 2 (two) times daily with a meal.   60 tablet   5   . insulin aspart (NOVOLOG) 100 UNIT/ML injection   Subcutaneous   Inject 8 Units into the skin 3 (three) times daily before meals.   10 mL   11   . LANTUS 100 UNIT/ML injection   Subcutaneous  Inject 0.4 mLs (40 Units total) into the skin every evening.   10 mL   3     Dispense as written.   Marland Kitchen lisinopril (PRINIVIL,ZESTRIL) 40 MG tablet   Oral   Take 1 tablet (40 mg total) by mouth daily.   90 tablet   3   . metFORMIN (GLUCOPHAGE) 500 MG tablet   Oral   Take 1 tablet by mouth 2 (two) times daily.         . nitroGLYCERIN (NITROSTAT) 0.4 MG SL tablet   Sublingual   Place 1 tablet (0.4 mg total) under the tongue every 5 (five) minutes as needed for chest pain.   30 tablet   2   . ondansetron (ZOFRAN ODT) 4 MG disintegrating tablet   Oral   Take 1 tablet (4 mg total) by mouth every 8 (eight) hours as needed  for nausea or vomiting.   8 tablet   0   . ticagrelor (BRILINTA) 90 MG TABS tablet   Oral   Take 1 tablet (90 mg total) by mouth 2 (two) times daily.   60 tablet   5     Allergies Review of patient's allergies indicates no known allergies.  History reviewed. No pertinent family history.  Social History Social History  Substance Use Topics  . Smoking status: Never Smoker   . Smokeless tobacco: None  . Alcohol Use: No    Review of Systems Constitutional: + fever/chills Eyes: No visual changes. ENT: + sore throat. Cardiovascular: + chest pain with cough. Respiratory: Denies shortness of breath. Gastrointestinal: + abdominal pain.  + nausea, + vomiting.  No diarrhea.  + constipation. Genitourinary: Negative for dysuria. Musculoskeletal: Negative for back pain. Skin: Negative for rash. Neurological: Negative for headaches, focal weakness or numbness.  10-point ROS otherwise negative.  ____________________________________________   PHYSICAL EXAM:  Filed Vitals:   07/20/15 1725 07/20/15 1924  BP: 113/76 174/96  Pulse: 68   Temp: 98.1 F (36.7 C)   TempSrc: Oral   Resp: 18 16  Height: 5\' 9"  (1.753 m)   Weight: 170 lb (77.111 kg)   SpO2: 99%     VITAL SIGNS: ED Triage Vitals  Enc Vitals Group     BP 07/20/15 1725 113/76 mmHg     Pulse Rate 07/20/15 1725 68     Resp 07/20/15 1725 18     Temp 07/20/15 1725 98.1 F (36.7 C)     Temp Source 07/20/15 1725 Oral     SpO2 07/20/15 1725 99 %     Weight 07/20/15 1725 170 lb (77.111 kg)     Height 07/20/15 1725 5\' 9"  (1.753 m)     Head Cir --      Peak Flow --      Pain Score 07/20/15 1725 9     Pain Loc --      Pain Edu? --      Excl. in [place]? --     Constitutional: Alert and oriented. Well appearing and in no acute distress. Eyes: Conjunctivae are normal. PERRL. EOMI. Head: Atraumatic. Nose: No congestion/rhinnorhea. Mouth/Throat: Mucous membranes are moist.  Oropharynx non-erythematous. Neck: No stridor.   Supple without meningismus. Cardiovascular: Normal rate, regular rhythm. Grossly normal heart sounds.  Good peripheral circulation. Respiratory: Normal respiratory effort.  No retractions. Lungs CTAB. Gastrointestinal: Soft with mild epigastric tenderness, no rebound or guarding. No distention. No CVA tenderness. Genitourinary: deferred. Musculoskeletal: No lower extremity tenderness nor edema.  No joint effusions. Neurologic:  Normal speech  and language. No gross focal neurologic deficits are appreciated. No gait instability. Skin:  Skin is warm, dry and intact. No rash noted. Psychiatric: Mood and affect are normal. Speech and behavior are normal.  ____________________________________________   LABS (all labs ordered are listed, but only abnormal results are displayed)  Labs Reviewed  BASIC METABOLIC PANEL - Abnormal; Notable for the following:    Sodium 131 (*)    Chloride 98 (*)    Glucose, Bld 436 (*)    BUN 43 (*)    Creatinine, Ser 1.33 (*)    GFR calc non Af Amer 51 (*)    GFR calc Af Amer 60 (*)    All other components within normal limits  URINALYSIS COMPLETEWITH MICROSCOPIC ([facility] ONLY) - Abnormal; Notable for the following:    Color, Urine YELLOW (*)    APPearance CLEAR (*)    Glucose, UA >500 (*)    Squamous Epithelial / LPF 0-5 (*)    All other components within normal limits  GLUCOSE, CAPILLARY - Abnormal; Notable for the following:    Glucose-Capillary 394 (*)    All other components within normal limits  HEPATIC FUNCTION PANEL - Abnormal; Notable for the following:    ALT 15 (*)    All other components within normal limits  LIPASE, BLOOD - Abnormal; Notable for the following:    Lipase 80 (*)    All other components within normal limits  GLUCOSE, CAPILLARY - Abnormal; Notable for the following:    Glucose-Capillary 355 (*)    All other components within normal limits  CBC  TROPONIN I  CBG MONITORING, ED    ____________________________________________  EKG  ED ECG REPORT I, Joanne Gavel, the attending physician, personally viewed and interpreted this ECG.   Date: 07/20/2015  EKG Time: 19:24  Rate: 57  Rhythm: sinus bradycardia  Axis: normal  Intervals:none  ST&T Change: No acute ST elevation or acute ST depression.  ____________________________________________  RADIOLOGY  3 view abdominal plain films IMPRESSION: 1. Lungs are clear and there is no evidence of acute cardiopulmonary abnormality. No evidence of pneumonia. 2. Nonobstructive bowel gas pattern and no evidence of acute intra-abdominal abnormality. Moderate amount of stool throughout the nondistended colon (constipation? ).  RUQ ultrasound IMPRESSION: No acute abnormalities. ____________________________________________   PROCEDURES  Procedure(s) performed: None  Procedures  Critical Care performed: No  ____________________________________________   INITIAL IMPRESSION / ASSESSMENT AND PLAN / ED COURSE  Pertinent labs & imaging results that were available during my care of the patient were reviewed by me and considered in my medical decision making (see chart for details).  [name] is a 75 y.o. male with history of diabetes, hypertension, hyperlipidemia, coronary artery disease status post stents who presents for evaluation of hyperglycemia in the setting of diffuse abdominal pain, nausea, vomiting, sore throat, cough and fever over the past 3-4 days likely secondary to a viral syndrome given his constellation of symptoms. On exam, he is well-appearing and in no acute distress. His vital signs are stable, he is afebrile. He has mild tenderness to palpation in the epigastrium. On arrival to the emergency department his blood sugar was 436, improved to 394 at this time. Anion gap of 10, normal bicarbonate, not consistent with DKA. CBC generally unremarkable. CMP is notable for mild creatinine elevation  at 1.33, sodium 131. We'll give IV fluids. Lipase is elevated 80, we'll obtain right upper quadrant ultrasound to evaluate for an obstructing stone as a cause of pancreatitis though  the remainder of his lab work is not consistent with this. Urinalysis pending. Three-view abdominal plain films pending to rule out obstruction and evaluate for any evidence of pneumonia. Will trat symptomatically and Reassess for disposition.  ----------------------------------------- 8:15 PM on 07/20/2015 ----------------------------------------- Patient reports he is feeling much better, he is requesting discharge. He is tolerating by mouth intake has had no vomiting since arrival to the emergency department. His blood sugar has improved to 350. We discussed that he would go home and take his usual medications. Right upper quadrant ultrasound showed no acute abnormality, clinical picture is not consistent with an obstructing gallstone causing pancreatitis. It may be reactive/secondary to vomiting. In any case, his pain is well-controlled and is tolerating by mouth intake and does not desire admission. Three-view of the abdomen shows no acute abnormalities, question constipation. Urinalysis is not consistent with infection. Troponin negative, EKG reassuring, not consistent with ACS. We discussed meticulous return precautions, need for close PCP follow-up and he is comfortable with the discharge plan. DC home. ____________________________________________   FINAL CLINICAL IMPRESSION(S) / ED DIAGNOSES  Final diagnoses:  Pain  Hyperglycemia  Generalized abdominal pain  Acute pancreatitis, unspecified pancreatitis type      NEW MEDICATIONS STARTED DURING THIS VISIT:  New Prescriptions   ONDANSETRON (ZOFRAN ODT) 4 MG DISINTEGRATING TABLET    Take 1 tablet (4 mg total) by mouth every 8 (eight) hours as needed for nausea or vomiting.     Note:  This document was prepared using Dragon voice recognition software and  may include unintentional dictation errors.    Joanne Gavel, MD 07/20/15 2020  Joanne Gavel, MD 07/20/15 2022

## 2015-07-20 NOTE — ED Notes (Addendum)
States he has been feeling bad for a few days, states his "blood was too thick", pt unable to tell why he is here states "I dont know nothing", states abd pain, denies any urinary problems, states generalized pain, states hx of DM and has not taken any of his meds today

## 2015-07-28 ENCOUNTER — Inpatient Hospital Stay
Admission: EM | Admit: 2015-07-28 | Discharge: 2015-07-31 | DRG: 683 | Disposition: A | Discharge status: Home / Self Care | Payer: Medicare Other | Attending: Internal Medicine | Admitting: Internal Medicine

## 2015-07-28 ENCOUNTER — Encounter: Payer: Self-pay | Admitting: Emergency Medicine

## 2015-07-28 DIAGNOSIS — I2510 Atherosclerotic heart disease of native coronary artery without angina pectoris: Secondary | ICD-10-CM | POA: Diagnosis present

## 2015-07-28 DIAGNOSIS — I959 Hypotension, unspecified: Secondary | ICD-10-CM

## 2015-07-28 DIAGNOSIS — E871 Hypo-osmolality and hyponatremia: Secondary | ICD-10-CM | POA: Diagnosis present

## 2015-07-28 DIAGNOSIS — E785 Hyperlipidemia, unspecified: Secondary | ICD-10-CM | POA: Diagnosis present

## 2015-07-28 DIAGNOSIS — Z9889 Other specified postprocedural states: Secondary | ICD-10-CM | POA: Diagnosis not present

## 2015-07-28 DIAGNOSIS — A048 Other specified bacterial intestinal infections: Secondary | ICD-10-CM

## 2015-07-28 DIAGNOSIS — E11649 Type 2 diabetes mellitus with hypoglycemia without coma: Secondary | ICD-10-CM | POA: Diagnosis present

## 2015-07-28 DIAGNOSIS — K449 Diaphragmatic hernia without obstruction or gangrene: Secondary | ICD-10-CM | POA: Diagnosis present

## 2015-07-28 DIAGNOSIS — E860 Dehydration: Secondary | ICD-10-CM

## 2015-07-28 DIAGNOSIS — K5900 Constipation, unspecified: Secondary | ICD-10-CM | POA: Diagnosis present

## 2015-07-28 DIAGNOSIS — R1310 Dysphagia, unspecified: Secondary | ICD-10-CM

## 2015-07-28 DIAGNOSIS — Z7982 Long term (current) use of aspirin: Secondary | ICD-10-CM | POA: Diagnosis not present

## 2015-07-28 DIAGNOSIS — E869 Volume depletion, unspecified: Secondary | ICD-10-CM

## 2015-07-28 DIAGNOSIS — N179 Acute kidney failure, unspecified: Principal | ICD-10-CM | POA: Diagnosis present

## 2015-07-28 DIAGNOSIS — I252 Old myocardial infarction: Secondary | ICD-10-CM | POA: Diagnosis not present

## 2015-07-28 DIAGNOSIS — Z955 Presence of coronary angioplasty implant and graft: Secondary | ICD-10-CM | POA: Diagnosis not present

## 2015-07-28 DIAGNOSIS — I10 Essential (primary) hypertension: Secondary | ICD-10-CM | POA: Diagnosis present

## 2015-07-28 DIAGNOSIS — R112 Nausea with vomiting, unspecified: Secondary | ICD-10-CM

## 2015-07-28 DIAGNOSIS — M1990 Unspecified osteoarthritis, unspecified site: Secondary | ICD-10-CM | POA: Diagnosis present

## 2015-07-28 DIAGNOSIS — Z794 Long term (current) use of insulin: Secondary | ICD-10-CM | POA: Diagnosis not present

## 2015-07-28 DIAGNOSIS — N400 Enlarged prostate without lower urinary tract symptoms: Secondary | ICD-10-CM | POA: Diagnosis present

## 2015-07-28 DIAGNOSIS — B9681 Helicobacter pylori [H. pylori] as the cause of diseases classified elsewhere: Secondary | ICD-10-CM | POA: Diagnosis present

## 2015-07-28 DIAGNOSIS — Z7984 Long term (current) use of oral hypoglycemic drugs: Secondary | ICD-10-CM

## 2015-07-28 DIAGNOSIS — R531 Weakness: Secondary | ICD-10-CM

## 2015-07-28 DIAGNOSIS — Z7951 Long term (current) use of inhaled steroids: Secondary | ICD-10-CM | POA: Diagnosis not present

## 2015-07-28 DIAGNOSIS — Z79899 Other long term (current) drug therapy: Secondary | ICD-10-CM

## 2015-07-28 LAB — CBC WITH DIFFERENTIAL/PLATELET
BASOS ABS: 0 10*3/uL (ref 0–0.1)
BASOS PCT: 0 %
EOS ABS: 0.2 10*3/uL (ref 0–0.7)
EOS PCT: 2 %
HCT: 36.7 % — ABNORMAL LOW (ref 40.0–52.0)
Hemoglobin: 12.7 g/dL — ABNORMAL LOW (ref 13.0–18.0)
Lymphocytes Relative: 18 %
Lymphs Abs: 1.9 10*3/uL (ref 1.0–3.6)
MCH: 31.4 pg (ref 26.0–34.0)
MCHC: 34.7 g/dL (ref 32.0–36.0)
MCV: 90.4 fL (ref 80.0–100.0)
MONO ABS: 1.1 10*3/uL — AB (ref 0.2–1.0)
Monocytes Relative: 11 %
Neutro Abs: 7.2 10*3/uL — ABNORMAL HIGH (ref 1.4–6.5)
Neutrophils Relative %: 69 %
PLATELETS: 210 10*3/uL (ref 150–440)
RBC: 4.06 MIL/uL — AB (ref 4.40–5.90)
RDW: 13.2 % (ref 11.5–14.5)
WBC: 10.4 10*3/uL (ref 3.8–10.6)

## 2015-07-28 LAB — COMPREHENSIVE METABOLIC PANEL
ALBUMIN: 3.8 g/dL (ref 3.5–5.0)
ALT: 17 U/L (ref 17–63)
AST: 20 U/L (ref 15–41)
Alkaline Phosphatase: 67 U/L (ref 38–126)
Anion gap: 12 (ref 5–15)
BUN: 33 mg/dL — AB (ref 6–20)
CHLORIDE: 101 mmol/L (ref 101–111)
CO2: 28 mmol/L (ref 22–32)
Calcium: 9.5 mg/dL (ref 8.9–10.3)
Creatinine, Ser: 2.44 mg/dL — ABNORMAL HIGH (ref 0.61–1.24)
GFR calc Af Amer: 29 mL/min — ABNORMAL LOW (ref >60)
GFR, EST NON AFRICAN AMERICAN: 25 mL/min — AB (ref >60)
Glucose, Bld: 92 mg/dL (ref 65–99)
POTASSIUM: 4.5 mmol/L (ref 3.5–5.1)
SODIUM: 141 mmol/L (ref 135–145)
Total Bilirubin: 0.8 mg/dL (ref 0.3–1.2)
Total Protein: 7 g/dL (ref 6.5–8.1)

## 2015-07-28 LAB — MAGNESIUM: MAGNESIUM: 1.5 mg/dL — AB (ref 1.7–2.4)

## 2015-07-28 LAB — GLUCOSE, CAPILLARY: Glucose-Capillary: 203 mg/dL — ABNORMAL HIGH (ref 65–99)

## 2015-07-28 LAB — TROPONIN I

## 2015-07-28 MED ORDER — MAGNESIUM SULFATE 2 GM/50ML IV SOLN
2.0000 g | Freq: Once | INTRAVENOUS | Status: AC
  Administered 2015-07-28: 2 g via INTRAVENOUS
  Filled 2015-07-28: qty 50

## 2015-07-28 MED ORDER — ACETAMINOPHEN 650 MG RE SUPP: 650.0000 mg | Freq: Four times a day (QID) | RECTAL | Status: DC | PRN

## 2015-07-28 MED ORDER — ASPIRIN 81 MG PO CHEW
81.0000 mg | CHEWABLE_TABLET | Freq: Every day | ORAL | Status: DC
  Administered 2015-07-29 – 2015-07-31 (×3): 81 mg via ORAL
  Filled 2015-07-28 (×3): qty 1

## 2015-07-28 MED ORDER — BISACODYL 10 MG RE SUPP: 10.0000 mg | Freq: Every day | RECTAL | Status: DC | PRN

## 2015-07-28 MED ORDER — GABAPENTIN 100 MG PO CAPS
100.0000 mg | ORAL_CAPSULE | Freq: Two times a day (BID) | ORAL | Status: DC
  Administered 2015-07-28: 300 mg via ORAL
  Administered 2015-07-29 – 2015-07-31 (×4): 100 mg via ORAL
  Filled 2015-07-28 (×2): qty 1
  Filled 2015-07-28 (×3): qty 3
  Filled 2015-07-28: qty 1

## 2015-07-28 MED ORDER — SODIUM CHLORIDE 0.9 % IV SOLN
INTRAVENOUS | Status: DC
  Administered 2015-07-28 – 2015-07-31 (×6): via INTRAVENOUS

## 2015-07-28 MED ORDER — SENNOSIDES-DOCUSATE SODIUM 8.6-50 MG PO TABS: 1.0000 | ORAL_TABLET | Freq: Every evening | ORAL | Status: DC | PRN

## 2015-07-28 MED ORDER — PANTOPRAZOLE SODIUM 40 MG PO TBEC
40.0000 mg | DELAYED_RELEASE_TABLET | Freq: Two times a day (BID) | ORAL | Status: DC
  Administered 2015-07-28 – 2015-07-31 (×6): 40 mg via ORAL
  Filled 2015-07-28 (×6): qty 1

## 2015-07-28 MED ORDER — MAGNESIUM CITRATE PO SOLN: 1.0000 | Freq: Once | ORAL | Status: DC | PRN

## 2015-07-28 MED ORDER — FLUTICASONE PROPIONATE 50 MCG/ACT NA SUSP
2.0000 | Freq: Every day | NASAL | Status: DC
  Administered 2015-07-30 – 2015-07-31 (×2): 2 via NASAL
  Filled 2015-07-28: qty 16

## 2015-07-28 MED ORDER — HYDROCODONE-ACETAMINOPHEN 5-325 MG PO TABS: 1.0000 | ORAL_TABLET | ORAL | Status: DC | PRN

## 2015-07-28 MED ORDER — SODIUM CHLORIDE 0.9 % IV BOLUS (SEPSIS)
500.0000 mL | INTRAVENOUS | Status: AC
  Administered 2015-07-28: 500 mL via INTRAVENOUS

## 2015-07-28 MED ORDER — INSULIN ASPART 100 UNIT/ML ~~LOC~~ SOLN
0.0000 [IU] | Freq: Three times a day (TID) | SUBCUTANEOUS | Status: DC
  Administered 2015-07-29: 2 [IU] via SUBCUTANEOUS
  Administered 2015-07-29 – 2015-07-31 (×3): 4 [IU] via SUBCUTANEOUS
  Filled 2015-07-28 (×2): qty 4
  Filled 2015-07-28: qty 1
  Filled 2015-07-28: qty 4

## 2015-07-28 MED ORDER — ONDANSETRON 4 MG PO TBDP
4.0000 mg | ORAL_TABLET | Freq: Three times a day (TID) | ORAL | Status: DC | PRN
  Filled 2015-07-28: qty 1

## 2015-07-28 MED ORDER — DULAGLUTIDE 0.75 MG/0.5ML ~~LOC~~ SOAJ: 0.7500 mg | SUBCUTANEOUS | Status: DC

## 2015-07-28 MED ORDER — TICAGRELOR 90 MG PO TABS
90.0000 mg | ORAL_TABLET | Freq: Two times a day (BID) | ORAL | Status: DC
  Administered 2015-07-28 – 2015-07-31 (×6): 90 mg via ORAL
  Filled 2015-07-28 (×6): qty 1

## 2015-07-28 MED ORDER — INSULIN GLARGINE 100 UNIT/ML ~~LOC~~ SOLN
65.0000 [IU] | Freq: Every evening | SUBCUTANEOUS | Status: DC
  Administered 2015-07-28 – 2015-07-29 (×2): 65 [IU] via SUBCUTANEOUS
  Filled 2015-07-28 (×2): qty 0.65

## 2015-07-28 MED ORDER — SODIUM CHLORIDE 0.9 % IV BOLUS (SEPSIS)
500.0000 mL | INTRAVENOUS | Status: AC
Start: 2015-07-28 — End: 2015-07-28
  Administered 2015-07-28: 500 mL via INTRAVENOUS

## 2015-07-28 MED ORDER — SODIUM CHLORIDE 0.9% FLUSH
3.0000 mL | Freq: Two times a day (BID) | INTRAVENOUS | Status: DC
  Administered 2015-07-28: 3 mL via INTRAVENOUS

## 2015-07-28 MED ORDER — SODIUM CHLORIDE 0.9 % IV SOLN
Freq: Once | INTRAVENOUS | Status: AC
  Administered 2015-07-28: 21:00:00 via INTRAVENOUS
  Filled 2015-07-28: qty 1000

## 2015-07-28 MED ORDER — ZOLPIDEM TARTRATE 5 MG PO TABS: 5.0000 mg | ORAL_TABLET | Freq: Every evening | ORAL | Status: DC | PRN

## 2015-07-28 MED ORDER — ACETAMINOPHEN 325 MG PO TABS: 650.0000 mg | ORAL_TABLET | Freq: Four times a day (QID) | ORAL | Status: DC | PRN

## 2015-07-28 MED ORDER — METOCLOPRAMIDE HCL 10 MG PO TABS
5.0000 mg | ORAL_TABLET | Freq: Two times a day (BID) | ORAL | Status: DC
  Administered 2015-07-28 – 2015-07-31 (×6): 5 mg via ORAL
  Filled 2015-07-28 (×5): qty 1
  Filled 2015-07-28: qty 2

## 2015-07-28 MED ORDER — ENOXAPARIN SODIUM 30 MG/0.3ML ~~LOC~~ SOLN: 30.0000 mg | SUBCUTANEOUS | Status: DC

## 2015-07-28 MED ORDER — CARVEDILOL 3.125 MG PO TABS
3.1250 mg | ORAL_TABLET | Freq: Two times a day (BID) | ORAL | Status: DC
Start: 2015-07-29 — End: 2015-07-31
  Administered 2015-07-29 – 2015-07-30 (×2): 3.125 mg via ORAL
  Filled 2015-07-28 (×5): qty 1

## 2015-07-28 MED ORDER — ATORVASTATIN CALCIUM 20 MG PO TABS
40.0000 mg | ORAL_TABLET | Freq: Every day | ORAL | Status: DC
  Administered 2015-07-29 – 2015-07-30 (×2): 40 mg via ORAL
  Filled 2015-07-28 (×2): qty 2

## 2015-07-28 NOTE — ED Triage Notes (Addendum)
Pt sent over from [facility] for abnormal Calcium levels.

## 2015-07-28 NOTE — ED Provider Notes (Addendum)
[facility] Emergency Department Provider Note  ____________________________________________  Time seen: Approximately 6:02 PM  I have reviewed the triage vital signs and the nursing notes.   HISTORY  Chief Complaint Abnormal Lab and Hypotension    HPI [name] is a 75 y.o. male who recently has been seen in the ED for hyperglycemia as well as persistent N/V (although that is not an issue today).  He presents by private vehicle today because he was called by his PCP and told to go to the ED due to abnormal labs.  He had an office visit yesterday to follow up from a recent ED visit.  They ordered outpatient blood work for this morning.  When the results came back, the office called with concerns about elevated potassium and possibly decreased renal function (although the patient mistakenly thought that his calcium was too low; I verified in [place] the documentation of the telephone encounter).  He reports that he has some lightheadedness and dizziness when he ambulates but otherwise he is asymptomatic.  Specifically he denies fever/chills, chest pain, shortness of breath, abdominal pain, nausea, vomiting, diarrhea.  Past Medical History:  Diagnosis Date  . BPH (benign prostatic hyperplasia)   . CAD (coronary artery disease)    a. 10/2014 Ant STEMI/PCI: LM nl, LAD 136m (2.75x38 Xience DES) - R->L collats, D1 60, D2 90ost, LCX 20ost, OM1/2/3 min irregs, RCA 57m (2.75x15 Xience DES), 50d, RPDA small;  b. 10/2014 Echo: EF 55-60%, mod ant/ap HK, Gr1 DD.  . Diabetes mellitus without complication ([place])   . Essential hypertension   . Hyperlipidemia   . Osteoarthritis     Patient Active Problem List   Diagnosis Date Noted  . CAD (coronary artery disease)   . Essential hypertension   . Diabetes mellitus without complication ([place])   . S/P coronary artery stent placement   . Uncontrolled type 2 diabetes mellitus with circulatory disorder ([place])   .  Hyperlipidemia   . ST elevation myocardial infarction (STEMI) of anterior wall ([place]) 10/20/2014  . ST elevation (STEMI) myocardial infarction involving left anterior descending coronary artery [facility])     Past Surgical History:  Procedure Laterality Date  . CARDIAC CATHETERIZATION N/A 10/20/2014   Procedure: Left Heart Cath and Coronary Angiography;  Surgeon: Wellington Hampshire, MD;  Location: [place] [facility];  Service: Cardiovascular;  Laterality: N/A;  . CARDIAC CATHETERIZATION Right 10/20/2014   Procedure: Coronary Stent Intervention;  Surgeon: Wellington Hampshire, MD;  Location: [place] [facility];  Service: Cardiovascular;  Laterality: Right;  LAD/RCA Stent placement    Current Outpatient Rx  . Order #: [id number] Class: Historical Med  . Order #: [id number] Class: Normal  . Order #: [id number] Class: Normal  . Order #: [id number] Class: Historical Med  . Order #: [id number] Class: Historical Med  . Order #: [id number] Class: Historical Med  . Order #: [id number] Class: Historical Med  . Order #: [id number] Class: No Print  . Order #: [id number] Class: No Print  . Order #: [id number] Class: Normal  . Order #: [id number] Class: Historical Med  . Order #: [id number] Class: Historical Med  . Order #: [id number] Class: Historical Med  . Order #: [id number] Class: Normal  . Order #: [id number] Class: Print  . Order #: [id number] Class: Historical Med  . Order #: [id number] Class: Normal    Allergies Review of patient's allergies indicates no known allergies.  No family history on file.  Social History Social History  Substance Use Topics  . Smoking status: Never Smoker  . Smokeless tobacco: Never Used  .  Alcohol use No    Review of Systems Constitutional: No fever/chills Eyes: No visual changes. ENT: No sore throat. Cardiovascular: Denies chest pain. Respiratory: Denies shortness of breath. Gastrointestinal: No abdominal pain.  No nausea, no vomiting.  No diarrhea.  No constipation. Genitourinary:  Negative for dysuria. Musculoskeletal: Negative for back pain. Skin: Negative for rash. Neurological: Negative for headaches, focal weakness or numbness.  Some lightheadedness upon ambulation  10-point ROS otherwise negative.  ____________________________________________   PHYSICAL EXAM:  VITAL SIGNS: ED Triage Vitals  Enc Vitals Group     BP 07/28/15 1709 (!) 81/42     Pulse Rate 07/28/15 1707 89     Resp 07/28/15 1707 20     Temp 07/28/15 1707 97.8 F (36.6 C)     Temp Source 07/28/15 1707 Oral     SpO2 07/28/15 1707 98 %     Weight --      Height --      Head Circumference --      Peak Flow --      Pain Score 07/28/15 1718 10     Pain Loc --      Pain Edu? --      Excl. in [place]? --     Constitutional: Alert and oriented. Well appearing and in no acute distress. Eyes: Conjunctivae are normal. PERRL. EOMI. Head: Atraumatic. Nose: No congestion/rhinnorhea. Mouth/Throat: Mucous membranes are moist.  Oropharynx non-erythematous. Neck: No stridor.  No meningeal signs.   Cardiovascular: Normal rate, regular rhythm. Good peripheral circulation. Grossly normal heart sounds.   Respiratory: Normal respiratory effort.  No retractions. Lungs CTAB. Gastrointestinal: Soft and nontender. No distention.  Musculoskeletal: No lower extremity tenderness nor edema. No gross deformities of extremities. Neurologic:  Normal speech and language. No gross focal neurologic deficits are appreciated.  Skin:  Skin is warm, dry and intact. No rash noted. Psychiatric: Mood and affect are normal. Speech and behavior are normal.  ____________________________________________   LABS (all labs ordered are listed, but only abnormal results are displayed)  Labs Reviewed  CBC WITH DIFFERENTIAL/PLATELET - Abnormal; Notable for the following:       Result Value   RBC 4.06 (*)    Hemoglobin 12.7 (*)    HCT 36.7 (*)    Neutro Abs 7.2 (*)    Monocytes Absolute 1.1 (*)    All other components within  normal limits  COMPREHENSIVE METABOLIC PANEL - Abnormal; Notable for the following:    BUN 33 (*)    Creatinine, Ser 2.44 (*)    GFR calc non Af Amer 25 (*)    GFR calc Af Amer 29 (*)    All other components within normal limits  MAGNESIUM - Abnormal; Notable for the following:    Magnesium 1.5 (*)    All other components within normal limits  TROPONIN I  URINALYSIS COMPLETEWITH MICROSCOPIC ([facility] ONLY)   ____________________________________________  EKG  ED ECG REPORT I, Jaser Fullen, the attending physician, personally viewed and interpreted this ECG.  Date: 07/28/2015 EKG Time: 17:19 Rate: 72 Rhythm: normal sinus rhythm QRS Axis: normal Intervals: normal ST/T Wave abnormalities: normal Conduction Disturbances: none Narrative Interpretation: unremarkable  ____________________________________________  RADIOLOGY   No results found.  ____________________________________________   PROCEDURES  Procedure(s) performed:   Procedures   ____________________________________________   INITIAL IMPRESSION / ASSESSMENT AND PLAN / ED COURSE  Pertinent labs & imaging results that were available during my care of the patient were reviewed by me and considered in my medical decision making (  see chart for details).  The patient is hypotensive upon arrival but is essentially asymptomatic except when he stands up and ambulates.  His EKG is unremarkable.  He is confused about the lab abnormality but I will attempt to clarify by checking all of his labs again today in the ED.  I am giving him a fluid bolus for his hypotension.  I will then reassess  Clinical Course  Value Comment By Time  Creatinine: (!) 2.44 The patient's labs are notable for a creatinine of 2.44.  This is significantly elevated over his last creatinine of 1.33.  I had an extended conversation with the patient about why he needs admission for acute renal failure (likely secondary to recent nausea, vomiting, and  diarrhea).  Unfortunately he has a daughter at home with traumatic brain injury and he is the only caregiver.  He states that he cannot be admitted to the hospital because no one else is available to care for her tonight.  I explained to him the potential complications associated with acute renal failure including permanent disability, permanent renal failure including dialysis, and even death.  He understands this but cannot stay.  He has the capacity to make this decision.  As an alternative he promises that he will come back tomorrow if he is able to arrange with a family member to stay with his daughter.  He understands that he will be reevaluated tomorrow with additional blood work and the determination will be made at that time if he needs admission.  He received 1 L of fluids in the ED.  I offered to give additional fluids but he needs to go now because his babysitter's leaving.  I encouraged by mouth intake of water and Gatorade and reminded him he can return at any time.  He knows he is leaving [place]. Hinda Kehr, MD 07/25 1904   The patient was able to talk to some family members and find someone to care for his daughter.  He would now prefer to stay.  I discussed the case with Dr. Brigitte Pulse the hospitalist in person and he will admit.  I am starting some additional fluids until the hospitalist sees the patient.  I am also repleting his magnesium of 1.5. Hinda Kehr, MD 07/25 2005    ____________________________________________  FINAL CLINICAL IMPRESSION(S) / ED DIAGNOSES  Final diagnoses:  Acute renal failure, unspecified acute renal failure type (HCC)  Volume depletion, gastrointestinal loss  Hypotension, unspecified  Hypomagnesemia     MEDICATIONS GIVEN DURING THIS VISIT:  Medications  magnesium sulfate IVPB 2 g 50 mL (2 g Intravenous New Bag/Given 07/28/15 2040)  sodium chloride 0.9 % bolus 500 mL (0 mLs Intravenous Stopped 07/28/15 1802)  sodium chloride 0.9 % bolus  500 mL (0 mLs Intravenous Stopped 07/28/15 1920)  0.9 %  sodium chloride infusion ( Intravenous New Bag/Given 07/28/15 2039)     NEW OUTPATIENT MEDICATIONS STARTED DURING THIS VISIT:  New Prescriptions   No medications on file      Note:  This document was prepared using Dragon voice recognition software and may include unintentional dictation errors.    Hinda Kehr, MD 07/28/15 1911    Hinda Kehr, MD 07/28/15 2052

## 2015-07-28 NOTE — ED Triage Notes (Signed)
Pt report having some dizziness for a couple of mths.

## 2015-07-28 NOTE — ED Notes (Signed)
Toileting offered to pt,pt does not feel urge to urinate.

## 2015-07-28 NOTE — ED Notes (Signed)
Pt found someone to take care of his daughter and has decided to stay this evening. Dr. Karma Greaser

## 2015-07-28 NOTE — ED Notes (Addendum)
Pt was sent over from [facility] with reports that his Calcium level is low. Pt reports he was admitted here to the hospital on 7/17 and kept for hyperglycemia, today he was going for follow up appt where they did lab work. Pt reports he feels weak. Hypotensive in triage.

## 2015-07-28 NOTE — ED Notes (Signed)
Pt refused to sign the form. Pt states, "Its not that I am refusing treatment, I just can't be here right now because I have to get some things done and find someone to keep my daughter"

## 2015-07-28 NOTE — Discharge Instructions (Signed)
As we discussed, your kidneys are failing, most likely due to dehydration from all your recent vomiting and diarrhea.  We offered admission to the hospital by you stated that you cannot stay due to not having someone to care for your daughter.  As a result to your leaving [place].  As we discussed this may lead to chronic disability or even death.  We encourage you to drink plenty of water and/or Gatorade, read through the included information, follow up with your primary care doctor tomorrow, and return to this Emergency Department at any point for reassessment, reevaluation, and possible admission if warranted upon reassessment.

## 2015-07-29 LAB — COMPREHENSIVE METABOLIC PANEL
ALT: 15 U/L — ABNORMAL LOW (ref 17–63)
ANION GAP: 5 (ref 5–15)
AST: 17 U/L (ref 15–41)
Albumin: 3 g/dL — ABNORMAL LOW (ref 3.5–5.0)
Alkaline Phosphatase: 55 U/L (ref 38–126)
BILIRUBIN TOTAL: 0.5 mg/dL (ref 0.3–1.2)
BUN: 33 mg/dL — ABNORMAL HIGH (ref 6–20)
CO2: 29 mmol/L (ref 22–32)
Calcium: 8.6 mg/dL — ABNORMAL LOW (ref 8.9–10.3)
Chloride: 104 mmol/L (ref 101–111)
Creatinine, Ser: 1.91 mg/dL — ABNORMAL HIGH (ref 0.61–1.24)
GFR calc Af Amer: 38 mL/min — ABNORMAL LOW (ref >60)
GFR, EST NON AFRICAN AMERICAN: 33 mL/min — AB (ref >60)
Glucose, Bld: 291 mg/dL — ABNORMAL HIGH (ref 65–99)
POTASSIUM: 4.3 mmol/L (ref 3.5–5.1)
Sodium: 138 mmol/L (ref 135–145)
TOTAL PROTEIN: 5.5 g/dL — AB (ref 6.5–8.1)

## 2015-07-29 LAB — CBC
HEMATOCRIT: 36.4 % — AB (ref 40.0–52.0)
HEMOGLOBIN: 12.2 g/dL — AB (ref 13.0–18.0)
MCH: 30.7 pg (ref 26.0–34.0)
MCHC: 33.5 g/dL (ref 32.0–36.0)
MCV: 91.7 fL (ref 80.0–100.0)
Platelets: 159 10*3/uL (ref 150–440)
RBC: 3.97 MIL/uL — ABNORMAL LOW (ref 4.40–5.90)
RDW: 12.8 % (ref 11.5–14.5)
WBC: 6.4 10*3/uL (ref 3.8–10.6)

## 2015-07-29 LAB — GLUCOSE, CAPILLARY
GLUCOSE-CAPILLARY: 101 mg/dL — AB (ref 65–99)
GLUCOSE-CAPILLARY: 173 mg/dL — AB (ref 65–99)
GLUCOSE-CAPILLARY: 145 mg/dL — AB (ref 65–99)
Glucose-Capillary: 130 mg/dL — ABNORMAL HIGH (ref 65–99)
Glucose-Capillary: 202 mg/dL — ABNORMAL HIGH (ref 65–99)

## 2015-07-29 LAB — MAGNESIUM: MAGNESIUM: 2.1 mg/dL (ref 1.7–2.4)

## 2015-07-29 LAB — PHOSPHORUS: Phosphorus: 2.7 mg/dL (ref 2.5–4.6)

## 2015-07-29 MED ORDER — POLYETHYLENE GLYCOL 3350 17 G PO PACK: 17.0000 g | PACK | Freq: Every day | ORAL | Status: DC | PRN

## 2015-07-29 MED ORDER — ENOXAPARIN SODIUM 40 MG/0.4ML ~~LOC~~ SOLN
40.0000 mg | SUBCUTANEOUS | Status: DC
  Administered 2015-07-29 – 2015-07-30 (×2): 40 mg via SUBCUTANEOUS
  Filled 2015-07-29 (×2): qty 0.4

## 2015-07-29 NOTE — Progress Notes (Signed)
[name] at [place] NAME: [name]    MR#:  [id number]  DATE OF BIRTH:  1940/12/02  SUBJECTIVE:  CHIEF COMPLAINT:   Chief Complaint  Patient presents with  . Abnormal Lab  . Hypotension  Patient is 75 year old Caucasian male with past medical history significant for history of hypertension, coronary artery disease, diabetes mellitus, hyperlipidemia, who presents to the hospital with complaints of abnormal lab studies. On arrival to emergency room, he was complaining of nausea, vomiting, feeling unwell, dizzy, weak, going on for the past 2 or 3 weeks, also constipation as well as indigestion, his labs were drawn and they were found to be abnormal . in emergency room, patient's creatinine was found to be 2.44, magnesium 1.5, hemoglobin level of 12.7. Patient admits of intermittent nausea and vomiting episodes. No radiologic studies were performed. Admits of pain in the right side of chest with food intake.  Review of Systems  Constitutional: Negative for chills, fever and weight loss.  HENT: Negative for congestion.   Eyes: Negative for blurred vision and double vision.  Respiratory: Negative for cough, sputum production, shortness of breath and wheezing.   Cardiovascular: Negative for chest pain, palpitations, orthopnea, leg swelling and PND.  Gastrointestinal: Positive for constipation, nausea and vomiting. Negative for abdominal pain, blood in stool and diarrhea.  Genitourinary: Negative for dysuria, frequency, hematuria and urgency.  Musculoskeletal: Negative for falls.  Neurological: Negative for dizziness, tremors, focal weakness and headaches.  Endo/Heme/Allergies: Does not bruise/bleed easily.  Psychiatric/Behavioral: Negative for depression. The patient does not have insomnia.     VITAL SIGNS: Blood pressure (!) 136/58, pulse 62, temperature 97.9 F (36.6 C), temperature source Oral, resp. rate 15, height 5\' 9"  (1.753 m),  weight 73 kg (160 lb 14.4 oz), SpO2 100 %.  PHYSICAL EXAMINATION:   GENERAL:  75 y.o.-year-old patient lying in the bed with no acute distress.  EYES: Pupils equal, round, reactive to light and accommodation. No scleral icterus. Extraocular muscles intact.  HEENT: Head atraumatic, normocephalic. Oropharynx and nasopharynx clear.  NECK:  Supple, no jugular venous distention. No thyroid enlargement, no tenderness.  LUNGS: Normal breath sounds bilaterally, no wheezing, rales,rhonchi or crepitation. No use of accessory muscles of respiration.  CARDIOVASCULAR: S1, S2 normal. No murmurs, rubs, or gallops.  ABDOMEN: Soft, mild discomfort in the epigastric area but no rebound or guarding, nondistended. Bowel sounds present. No organomegaly or mass.  EXTREMITIES: No pedal edema, cyanosis, or clubbing.  NEUROLOGIC: Cranial nerves II through XII are intact. Muscle strength 5/5 in all extremities. Sensation intact. Gait not checked.  PSYCHIATRIC: The patient is alert and oriented x 3.  SKIN: No obvious rash, lesion, or ulcer.   ORDERS/RESULTS REVIEWED:   CBC  Recent Labs Lab 07/28/15 1727 07/29/15 0330  WBC 10.4 6.4  HGB 12.7* 12.2*  HCT 36.7* 36.4*  PLT 210 159  MCV 90.4 91.7  MCH 31.4 30.7  MCHC 34.7 33.5  RDW 13.2 12.8  LYMPHSABS 1.9  --   MONOABS 1.1*  --   EOSABS 0.2  --   BASOSABS 0.0  --    ------------------------------------------------------------------------------------------------------------------  Chemistries   Recent Labs Lab 07/28/15 1727 07/29/15 0330  NA 141 138  K 4.5 4.3  CL 101 104  CO2 28 29  GLUCOSE 92 291*  BUN 33* 33*  CREATININE 2.44* 1.91*  CALCIUM 9.5 8.6*  MG 1.5* 2.1  AST 20 17  ALT 17 15*  ALKPHOS 67 55  BILITOT 0.8  0.5   ------------------------------------------------------------------------------------------------------------------ estimated creatinine clearance is 34.4 mL/min (by C-G formula based on SCr of 1.91  mg/dL). ------------------------------------------------------------------------------------------------------------------ No results for input(s): TSH, T4TOTAL, T3FREE, THYROIDAB in the last 72 hours.  Invalid input(s): FREET3  Cardiac Enzymes  Recent Labs Lab 07/28/15 1727  TROPONINI <0.03   ------------------------------------------------------------------------------------------------------------------ Invalid input(s): POCBNP ---------------------------------------------------------------------------------------------------------------  RADIOLOGY: No results found.  EKG:  Orders placed or performed during the hospital encounter of 07/28/15  . EKG 12-Lead  . EKG 12-Lead    ASSESSMENT AND PLAN:  Active Problems:   Acute kidney injury ([place])  #1. Acute renal failure, likely due to dehydration, poor oral intake, nausea, vomiting, improving with IV fluid administration #2. Intermittent nausea and vomiting, could be related to gastroparesis, advance Reglan, patient would benefit from a gastric emptying study, follow clinically #3, constipation, initiate Dulcolax, senna, MiraLAX,  #4 . Right-sided chest pain with food intake, odynophagia, get barium swallowing study, continue Protonix    Management plans discussed with the patient, family and they are in agreement.   DRUG ALLERGIES: No Known Allergies  CODE STATUS:     Code Status Orders        Start     Ordered   07/28/15 2238  Full code  Continuous     07/28/15 2237    Code Status History    Date Active Date Inactive Code Status Order ID Comments User Context   10/20/2014 10:38 AM 10/22/2014  2:38 PM Full Code [id number]  Wellington Hampshire, MD Inpatient      TOTAL TIME TAKING CARE OF THIS PATIENT: 40  minutes.    Theodoro Grist M.D on 07/29/2015 at 1:09 PM  Between 7am to 6pm - Pager - [phone]  After 6pm go to [email or web address] - password [email or web address] [place] [facility]  Office   [phone]  CC: Primary care physician; Valera Castle, MD

## 2015-07-29 NOTE — Care Management (Signed)
Patient was sent to ED from his PCP due to abnormal labs- creatinine 2.44.  He presented from home and independent in all his adls. Currently receiving IVFs

## 2015-07-29 NOTE — Progress Notes (Signed)
Patient was admitted from the ER on a wheelchair. He was ambulatory A&O X4, denied pain, N&V. Patient was oriented to his room, skin checked performed with another RN. Patient VS was stable and remained NSR on tele. Patient educated about fall prevention, and his needed items placed within patient's reach.

## 2015-07-29 NOTE — Progress Notes (Signed)
Enoxaparin   Patient qualifies for Enoxaparin 40 mg SQ daily based on CrCl >30 ml/min per policy. Will change to Enoxaparin 40 mg SQ daily.  Prentiss Hammett D. Travas Schexnayder, PharmD   

## 2015-07-29 NOTE — H&P (Addendum)
[facility] NAME: [name] MR#: [id number] DATE OF BIRTH: [birth year] DATE OF ADMISSION: 07/28/2015 PRIMARY CARE PHYSICIAN: Valera Castle, MD  REQUESTING/REFERRING PHYSICIAN: Dr. Karma Greaser  CHIEF COMPLAINT: Chief Complaint  Patient presents with  . Abnormal Lab  . Hypotension    HISTORY OF PRESENT ILLNESS: [name] is a 75 y.o. male with a known history of hypotension, CAD, DM, HLD and OA was in a usual state of health until he was called by his PCP today for abnormal labs and advised to go to the ED.  He states he was seen in the ED for nausea, vomiting, generally feeling unwell and subsequently followed up with PCP who drew labs.  Was told calcium was low (PCP notes indicated the concern was elevated potassium and impaired renal function).  He is largely asymptomatic with the exception of some mild dizziness and weakness that has been generalized for the past 2-3 weeks and also constipation with indigestion for which he has  Been referred to GI outpatient. Slight decrease in appetite secondary to constipation.    Otherwise there has been no change in status. Patient has been taking medication as prescribed and there has been no recent change in medication.  There has been no recent illness, travel or sick contacts.    Patient denies fevers/chills, weakness, dizziness, chest pain, shortness of breath, N/V/C/D, abdominal pain, dysuria/frequency, changes in mental status.   EMS/ED COURSE:  Patient received mag sulfate, NS.     PAST MEDICAL HISTORY: Past Medical History:  Diagnosis Date  . BPH (benign prostatic hyperplasia)   . CAD (coronary artery disease)    a. 10/2014 Ant STEMI/PCI: LM nl, LAD 15m (2.75x38 Xience DES) - R->L collats, D1 60, D2 90ost, LCX 20ost, OM1/2/3 min irregs, RCA 32m (2.75x15 Xience DES), 50d, RPDA small;  b. 10/2014 Echo: EF 55-60%, mod ant/ap HK, Gr1 DD.  . Diabetes mellitus without  complication ([place])   . Essential hypertension   . Hyperlipidemia   . Osteoarthritis       PAST SURGICAL HISTORY: Past Surgical History:  Procedure Laterality Date  . CARDIAC CATHETERIZATION N/A 10/20/2014   Procedure: Left Heart Cath and Coronary Angiography;  Surgeon: Wellington Hampshire, MD;  Location: [facility];  Service: Cardiovascular;  Laterality: N/A;  . CARDIAC CATHETERIZATION Right 10/20/2014   Procedure: Coronary Stent Intervention;  Surgeon: Wellington Hampshire, MD;  Location: [facility];  Service: Cardiovascular;  Laterality: Right;  LAD/RCA Stent placement      SOCIAL HISTORY: Social History  Substance Use Topics  . Smoking status: Never Smoker  . Smokeless tobacco: Never Used  . Alcohol use No      FAMILY HISTORY: No family history on file.   MEDICATIONS AT HOME: Prior to Admission medications   Medication Sig Start Date End Date Taking? Authorizing Provider  aspirin 81 MG tablet Take 81 mg by mouth daily.   Yes Historical Provider, MD  atorvastatin (LIPITOR) 40 MG tablet Take 1 tablet (40 mg total) by mouth daily at 6 PM. 03/26/15  Yes Wellington Hampshire, MD  carvedilol (COREG) 3.125 MG tablet Take 1 tablet (3.125 mg total) by mouth 2 (two) times daily with a meal. 11/25/14  Yes Rogelia Mire, NP  Dulaglutide (TRULICITY) [id number] SOPN Inject 0.75 mg into the skin every 7 (seven) days.   Yes Historical Provider, MD  fluticasone (FLONASE) 50 MCG/ACT nasal spray Place 2 sprays into both  nostrils daily.   Yes Historical Provider, MD  gabapentin (NEURONTIN) 100 MG capsule Take 100-300 mg by mouth 2 (two) times daily. Take 100mg  twice daily for 7 days, 200mg  twice daily for 7 days, then 300mg  twice daily thereafter   Yes Historical Provider, MD  glucagon (GLUCAGON EMERGENCY) 1 MG injection Inject 1 mg into the vein once as needed.   Yes Historical Provider, MD  insulin aspart (NOVOLOG) 100 UNIT/ML injection Inject 8 Units into the skin 3 (three)  times daily before meals. Patient taking differently: Inject 20 Units into the skin 3 (three) times daily before meals.  10/22/14  Yes Richard Leslye Peer, MD  LANTUS 100 UNIT/ML injection Inject 0.4 mLs (40 Units total) into the skin every evening. Patient taking differently: Inject 65 Units into the skin every evening.  10/22/14  Yes Richard Leslye Peer, MD  lisinopril (PRINIVIL,ZESTRIL) 40 MG tablet Take 1 tablet (40 mg total) by mouth daily. 07/15/15  Yes Wellington Hampshire, MD  meloxicam (MOBIC) 7.5 MG tablet Take 7.5 mg by mouth daily.   Yes Historical Provider, MD  metFORMIN (GLUCOPHAGE) 500 MG tablet Take 1 tablet by mouth 2 (two) times daily. 11/04/14 10/11/15 Yes Historical Provider, MD  metoCLOPramide (REGLAN) 5 MG tablet Take 5 mg by mouth 2 (two) times daily. For 10 days   Yes Historical Provider, MD  nitroGLYCERIN (NITROSTAT) 0.4 MG SL tablet Place 1 tablet (0.4 mg total) under the tongue every 5 (five) minutes as needed for chest pain. 11/25/14  Yes Rogelia Mire, NP  ondansetron (ZOFRAN ODT) 4 MG disintegrating tablet Take 1 tablet (4 mg total) by mouth every 8 (eight) hours as needed for nausea or vomiting. 07/20/15  Yes Joanne Gavel, MD  pantoprazole (PROTONIX) 40 MG tablet Take 40 mg by mouth 2 (two) times daily.   Yes Historical Provider, MD  ticagrelor (BRILINTA) 90 MG TABS tablet Take 1 tablet (90 mg total) by mouth 2 (two) times daily. 11/25/14  Yes Rogelia Mire, NP      DRUG ALLERGIES: No Known Allergies   REVIEW OF SYSTEMS: CONSTITUTIONAL: No fever/chills. (+) fatigue/ weakness. No weight gain, no weight loss. EYES: No blurry or double vision. ENT: No tinnitus. No postnasal drip. No redness or soreness of the oropharynx. RESPIRATORY: No cough, no wheeze, no hemoptysis. No dyspnea. CARDIOVASCULAR: No chest pain. No orthopnea. No palpitations. No syncope. GASTROINTESTINAL: No nausea, no vomiting or diarrhea. No abdominal pain. No melena or hematochezia. (+)  constipation GENITOURINARY: No dysuria or hematuria. ENDOCRINE: No polyuria or nocturia. No heat or cold intolerance. HEMATOLOGY: No anemia. No bruising. No bleeding. INTEGUMENTARY: No rashes. No lesions. MUSCULOSKELETAL: No arthritis. No swelling. No gout. NEUROLOGIC: No numbness, tingling, weakness or ataxia. No seizure-type activity. PSYCHIATRIC: No anxiety. No depression. No insomnia.  PHYSICAL EXAMINATION: VITAL SIGNS:  Triage BP 81/42. Blood pressure (!) 133/50, pulse 74, temperature 97.9 F (36.6 C), temperature source Oral, resp. rate 18, height 5\' 9"  (1.753 m), weight 73 kg (160 lb 14.4 oz), SpO2 97 %.  GENERAL: 75 y.o.-year-old white male patient, well-developed, well-nourished lying in the bed in no acute distress. EYES: Pupils equal, round, reactive to light and accommodation. No scleral icterus. Extraocular muscles intact. HEENT: Head atraumatic, normocephalic. Oropharynx and nasopharynx clear. Mucus membranes moist. NECK: Supple, full range of motion. No jugular venous distention. No thyroid enlargement, no tenderness, no lymphadenopathy. CHEST: Normal breath sounds bilaterally, no wheezing, rales, rhonchi or crepitation. No use of accessory muscles of respiration.  No reproducible chest wall tenderness.  CARDIOVASCULAR: S1, S2 normal. No murmurs, rubs, or gallops. Cap refill <2 seconds. ABDOMEN: Soft, nontender, nondistended. No rebound, guarding, rigidity. Normoactive bowel sounds present in all four quadrants. No organomegaly or mass. EXTREMITIES: Full range of motion. No pedal edema, cyanosis, or clubbing. NEUROLOGIC: Cranial nerves II through XII are grossly intact with no focal sensorimotor deficit. Muscle strength 5/5 in all extremities. Sensation intact. Gait not checked. PSYCHIATRIC: The patient is alert and oriented x 3. Normal affect, mood, thought content. SKIN: Warm, dry, and intact without obvious rash, lesion, or ulcer.  LABORATORY PANEL:  CBC  Recent  Labs Lab 07/28/15 1727  WBC 10.4  HGB 12.7*  HCT 36.7*  PLT 210   ----------------------------------------------------------------------------------------------------------------- Chemistries  Recent Labs Lab 07/28/15 1727  NA 141  K 4.5  CL 101  CO2 28  GLUCOSE 92  BUN 33*  CREATININE 2.44*  CALCIUM 9.5  MG 1.5*  AST 20  ALT 17  ALKPHOS 67  BILITOT 0.8   ------------------------------------------------------------------------------------------------------------------ Cardiac Enzymes  Recent Labs Lab 07/28/15 1727  TROPONINI <0.03   ------------------------------------------------------------------------------------------------------------------  RADIOLOGY: No results found.  EKG:  NSR @ 72bpm normal axis nonspecific ST T wave changes.    IMPRESSION AND PLAN:  This is a 75 year old white male with a history of DM, HTN, HLD, OA now being admitted with: 1. AKI - possibly secondary to dehydration, meds, decreased PO intake, infection? - Admit telemetry for monitoring, IV fluid hydration, recheck labs in AM 2. Hyponatremia  - IV fluid hydration, UA, hold Lisinopril and Mobic for now 3. Hypotension  - IV fluid hydration, monitor 4. DM, HTN, HLD - continue other home medications except Metformin.  CBG with ISS coverage. Check A1C.   Fluids: IV NS Diet/Nutrition: Heart Health Carb Controlled DVT Px: Lovenox renally dosed and SCDs.   All the records are reviewed and case discussed with ED provider. Management plans discussed with the patient and/or family who express understanding and agree with plan of care.  CODE STATUS: Full  TOTAL TIME TAKING CARE OF THIS PATIENT: 50 minutes.   Tobie Hellen D.O. on 07/29/2015 at 12:08 AM Between 7am to 6pm - Pager - [phone] After 6pm go to [email or web address] - [employer] [facility] Office [phone] CC: Primary care physician; Valera Castle, MD     Note: This  dictation was prepared with Dragon dictation along with smaller phrase technology. Any transcriptional errors that result from this process are unintentional.

## 2015-07-30 ENCOUNTER — Inpatient Hospital Stay: Payer: Medicare Other

## 2015-07-30 LAB — BASIC METABOLIC PANEL
ANION GAP: 5 (ref 5–15)
BUN: 27 mg/dL — ABNORMAL HIGH (ref 6–20)
CHLORIDE: 111 mmol/L (ref 101–111)
CO2: 24 mmol/L (ref 22–32)
Calcium: 8.1 mg/dL — ABNORMAL LOW (ref 8.9–10.3)
Creatinine, Ser: 1.45 mg/dL — ABNORMAL HIGH (ref 0.61–1.24)
GFR calc non Af Amer: 46 mL/min — ABNORMAL LOW (ref >60)
GFR, EST AFRICAN AMERICAN: 54 mL/min — AB (ref >60)
GLUCOSE: 53 mg/dL — AB (ref 65–99)
Potassium: 4.4 mmol/L (ref 3.5–5.1)
Sodium: 140 mmol/L (ref 135–145)

## 2015-07-30 LAB — GLUCOSE, CAPILLARY
GLUCOSE-CAPILLARY: 146 mg/dL — AB (ref 65–99)
GLUCOSE-CAPILLARY: 43 mg/dL — AB (ref 65–99)
GLUCOSE-CAPILLARY: 104 mg/dL — AB (ref 65–99)
GLUCOSE-CAPILLARY: 122 mg/dL — AB (ref 65–99)
GLUCOSE-CAPILLARY: 201 mg/dL — AB (ref 65–99)
Glucose-Capillary: 49 mg/dL — ABNORMAL LOW (ref 65–99)

## 2015-07-30 LAB — HEMOGLOBIN: Hemoglobin: 12.1 g/dL — ABNORMAL LOW (ref 13.0–18.0)

## 2015-07-30 LAB — H PYLORI, IGM, IGG, IGA AB
H Pylori IgG: 6.9 U/mL — ABNORMAL HIGH (ref 0.0–0.8)
H. PYLOGI, IGA ABS: 12.6 U — AB (ref 0.0–8.9)
H. Pylogi, Igm Abs: <9 units (ref 0.0–8.9)

## 2015-07-30 LAB — HEMOGLOBIN A1C: Hgb A1c MFr Bld: 8 % — ABNORMAL HIGH (ref 4.0–6.0)

## 2015-07-30 MED ORDER — AMOXICILLIN 500 MG PO CAPS
1000.0000 mg | ORAL_CAPSULE | Freq: Two times a day (BID) | ORAL | Status: DC
  Administered 2015-07-30 – 2015-07-31 (×2): 1000 mg via ORAL
  Filled 2015-07-30 (×4): qty 2

## 2015-07-30 MED ORDER — DOCUSATE SODIUM 100 MG PO CAPS
100.0000 mg | ORAL_CAPSULE | Freq: Two times a day (BID) | ORAL | Status: DC
  Administered 2015-07-30 – 2015-07-31 (×3): 100 mg via ORAL
  Filled 2015-07-30 (×3): qty 1

## 2015-07-30 MED ORDER — INSULIN GLARGINE 100 UNIT/ML ~~LOC~~ SOLN
40.0000 [IU] | Freq: Every evening | SUBCUTANEOUS | Status: DC
  Administered 2015-07-30: 40 [IU] via SUBCUTANEOUS
  Filled 2015-07-30: qty 0.4

## 2015-07-30 MED ORDER — CLARITHROMYCIN 500 MG PO TABS
500.0000 mg | ORAL_TABLET | Freq: Two times a day (BID) | ORAL | Status: DC
  Administered 2015-07-30 – 2015-07-31 (×2): 500 mg via ORAL
  Filled 2015-07-30 (×4): qty 1

## 2015-07-30 MED ORDER — DEXTROSE 50 % IV SOLN
25.0000 g | Freq: Once | INTRAVENOUS | Status: AC
  Administered 2015-07-30: 25 g via INTRAVENOUS

## 2015-07-30 MED ORDER — DEXTROSE 50 % IV SOLN
INTRAVENOUS | Status: AC
  Administered 2015-07-30: 25 g via INTRAVENOUS
  Filled 2015-07-30: qty 50

## 2015-07-30 NOTE — Progress Notes (Signed)
CBG = 43.  Juice x 1.   No symptoms.  Repeat CBG = 103

## 2015-07-30 NOTE — Progress Notes (Signed)
[facility] NAME: [name]    MR#:  [id number]  DATE OF BIRTH:  [birth year]  SUBJECTIVE:  CHIEF COMPLAINT:   Chief Complaint  Patient presents with  . Abnormal Lab  . Hypotension  Patient is 75 year old Caucasian male with past medical history significant for history of hypertension, coronary artery disease, diabetes mellitus, hyperlipidemia, who presents to the hospital with complaints of abnormal lab studies. On arrival to emergency room, he was complaining of nausea, vomiting, feeling unwell, dizzy, weak, going on for the past 2 or 3 weeks, also constipation as well as indigestion, his labs were drawn and they were found to be abnormal . in emergency room, patient's creatinine was found to be 2.44, magnesium 1.5, hemoglobin level of 12.7. Patient admits of intermittent nausea and vomiting episodes, resolved now. The patient complained of pain in the right side of chest with food intake. Patient underwent barium swallowing study today, which revealed presbyesophagus, small hiatal hernia, but no other abnormalities . Gastric emptying study could not have been done due to Reglan. No bowel movement yet, enema was ordered, patient is agreeable. Hypoglycemic again today 243, D50 was given.   Review of Systems  Constitutional: Negative for chills, fever and weight loss.  HENT: Negative for congestion.   Eyes: Negative for blurred vision and double vision.  Respiratory: Negative for cough, sputum production, shortness of breath and wheezing.   Cardiovascular: Negative for chest pain, palpitations, orthopnea, leg swelling and PND.  Gastrointestinal: Positive for constipation, nausea and vomiting. Negative for abdominal pain, blood in stool and diarrhea.  Genitourinary: Negative for dysuria, frequency, hematuria and urgency.  Musculoskeletal: Negative for falls.  Neurological: Negative for dizziness, tremors, focal weakness and  headaches.  Endo/Heme/Allergies: Does not bruise/bleed easily.  Psychiatric/Behavioral: Negative for depression. The patient does not have insomnia.     VITAL SIGNS: Blood pressure (!) 143/57, pulse 62, temperature 97.9 F (36.6 C), temperature source Oral, resp. rate 16, height 5\' 9"  (1.753 m), weight 73 kg (160 lb 14.4 oz), SpO2 98 %.  PHYSICAL EXAMINATION:   GENERAL:  75 y.o.-year-old patient lying in the bed with no acute distress.  EYES: Pupils equal, round, reactive to light and accommodation. No scleral icterus. Extraocular muscles intact.  HEENT: Head atraumatic, normocephalic. Oropharynx and nasopharynx clear.  NECK:  Supple, no jugular venous distention. No thyroid enlargement, no tenderness.  LUNGS: Normal breath sounds bilaterally, no wheezing, rales,rhonchi or crepitation. No use of accessory muscles of respiration.  CARDIOVASCULAR: S1, S2 normal. No murmurs, rubs, or gallops.  ABDOMEN: Soft, mild discomfort in the epigastric area but no rebound or guarding, nondistended. Bowel sounds present. No organomegaly or mass.  EXTREMITIES: No pedal edema, cyanosis, or clubbing.  NEUROLOGIC: Cranial nerves II through XII are intact. Muscle strength 5/5 in all extremities. Sensation intact. Gait not checked.  PSYCHIATRIC: The patient is alert and oriented x 3.  SKIN: No obvious rash, lesion, or ulcer.   ORDERS/RESULTS REVIEWED:   CBC  Recent Labs Lab 07/28/15 1727 07/29/15 0330 07/30/15 0443  WBC 10.4 6.4  --   HGB 12.7* 12.2* 12.1*  HCT 36.7* 36.4*  --   PLT 210 159  --   MCV 90.4 91.7  --   MCH 31.4 30.7  --   MCHC 34.7 33.5  --   RDW 13.2 12.8  --   LYMPHSABS 1.9  --   --   MONOABS 1.1*  --   --   EOSABS  0.2  --   --   BASOSABS 0.0  --   --    ------------------------------------------------------------------------------------------------------------------  Chemistries   Recent Labs Lab 07/28/15 1727 07/29/15 0330 07/30/15 0443  NA 141 138 140  K 4.5 4.3  4.4  CL 101 104 111  CO2 28 29 24   GLUCOSE 92 291* 53*  BUN 33* 33* 27*  CREATININE 2.44* 1.91* 1.45*  CALCIUM 9.5 8.6* 8.1*  MG 1.5* 2.1  --   AST 20 17  --   ALT 17 15*  --   ALKPHOS 67 55  --   BILITOT 0.8 0.5  --    ------------------------------------------------------------------------------------------------------------------ estimated creatinine clearance is 45.4 mL/min (by C-G formula based on SCr of 1.45 mg/dL). ------------------------------------------------------------------------------------------------------------------ No results for input(s): TSH, T4TOTAL, T3FREE, THYROIDAB in the last 72 hours.  Invalid input(s): FREET3  Cardiac Enzymes  Recent Labs Lab 07/28/15 1727  TROPONINI <0.03   ------------------------------------------------------------------------------------------------------------------ Invalid input(s): POCBNP ---------------------------------------------------------------------------------------------------------------  RADIOLOGY: Dg Esophagus  Result Date: 07/30/2015 CLINICAL DATA:  Patient reports sharp right-sided chest pain when eating; history of diabetes, recent renal failure, coronary artery disease. EXAM: ESOPHOGRAM / BARIUM SWALLOW / BARIUM TABLET STUDY TECHNIQUE: Combined double contrast and single contrast examination performed using effervescent crystals, thick barium liquid, and thin barium liquid. The patient was observed with fluoroscopy swallowing a 13 mm barium sulphate tablet. FLUOROSCOPY TIME:  Fluoroscopy Time:  0 minutes, 54 seconds Number of Acquired Images:  11 COMPARISON:  None in PACs FINDINGS: The patient ingested thick and thin barium and the gas-forming crystals without difficulty. The cervical esophagus distended well. There was no laryngeal penetration of the barium. The thoracic esophagus also distended well. A small reducible hiatal hernia was observed. No gastroesophageal reflux was demonstrated. There was no evidence  of stricture nor esophagitis. Intermittent tertiary contractions were observed. The barium tablet passed promptly for mouth to the stomach. IMPRESSION: Small reducible hiatal hernia without evidence of reflux, stricture, or esophagitis. Intermittent tertiary contractions consistent with presbyesophagus. No laryngeal penetration of the barium. The patient did not experience is typical sharp right-sided anterior chest discomfort during this study. Electronically Signed   By: David  Martinique M.D.   On: 07/30/2015 09:08   EKG:  Orders placed or performed during the hospital encounter of 07/28/15  . EKG 12-Lead  . EKG 12-Lead    ASSESSMENT AND PLAN:  Active Problems:   Acute kidney injury ([place])  #1. Acute renal failure, likely due to dehydration, poor oral intake, Intermittent nausea, vomiting, improved with IV fluid administration.  #2. Intermittent nausea and vomiting, could be related to gastroparesis, constipation, continue Reglan at current doses, patient would benefit from a gastric emptying study,improved clinically, however, patient remains constipated, and enema will be administered today, continue to observe oral intake  #3, constipation, continue Colace , senna, MiraLAX, enema will be administered today, following closely #4 . Right-sided chest pain with food intake, odynophagia, barium swallowing study revealed the presbyesophagus, hiatal hernia, continue Protonix, no symptomatology at present  #5 Diabetes mellitus with hypoglycemia in the morning, cut down Lantus to 40 units subcutaneously at bedtime, follow blood glucose levels in the morning, following oral intake #6. Generalized weakness, getting physical therapist involved for recommendations, possible home health physical therapy upon discharge   Management plans discussed with the patient, family and they are in agreement.   DRUG ALLERGIES: No Known Allergies  CODE STATUS:     Code Status Orders        Start     Ordered  07/28/15 2238  Full code  Continuous     07/28/15 2237    Code Status History    Date Active Date Inactive Code Status Order ID Comments User Context   10/20/2014 10:38 AM 10/22/2014  2:38 PM Full Code [id number]  Wellington Hampshire, MD Inpatient      TOTAL TIME TAKING CARE OF THIS PATIENT: 40  minutes.    Theodoro Grist M.D on 07/30/2015 at 12:14 PM  Between 7am to 6pm - Pager - [phone]  After 6pm go to [email or web address] - password [email or web address] [facility]  Office  [phone]  CC: Primary care physician; Valera Castle, MD

## 2015-07-30 NOTE — Progress Notes (Signed)
CBG is 43. D50 25 G given slow IVP.  Patient is NPO for a GI procedure.  Non-symptomic. P = 54. Other VS WNL.  Will repeat CBG at 0800.

## 2015-07-30 NOTE — Care Management Important Message (Signed)
Important Message  Patient Details  Name: QUILLIE BLEAKLEY MRN: [id number] Date of Birth: 23-Oct-1940   Medicare Important Message Given:  Yes    Katrina Stack, RN 07/30/2015, 11:59 AM

## 2015-07-30 NOTE — Care Management Important Message (Signed)
Important Message  Patient Details  Name: [name] MRN: [id number] Date of Birth: 1940/06/18   Medicare Important Message Given:       Katrina Stack, RN 07/30/2015, 11:58 AM

## 2015-07-30 NOTE — Evaluation (Signed)
Physical Therapy Evaluation Patient Details Name: [name] MRN: [id number] DOB: 1940-05-11 Today's Date: 07/30/2015   History of Present Illness  Pt is a 75 y.o. male presenting to hospital with abnormal labs (increased potassium and possibly decreased renal function) and hypotension.  Pt admitted with acute renal failure.  Of note, pt recently in ED 7/17 for hyperglycemia and persistent N/V; pt also with h/o lightheadedness and dizziness when ambulating.  PMH includes chronic T7 compression fx (from MRI 06/23/14), DM, CAD, htn, OA, STEMI, cardiac cath, hiatal hernia (per pt report) and s/p R TKA x6 (per pt report).  Clinical Impression  Prior to admission, pt was independent with functional mobility (occasionally uses SPC if one of his LE's are bothering him).  Pt lives in a 1 level home with his daughter (pt is caregiver for his daughter who has h/o head injury; assists with cleaning, meals, supervision, but no other assist required for daughter's care per pt report) with ramp to enter.  Currently pt is modified independent with bed mobility, SBA with transfers, and CGA for ambulation around nursing loop without AD.  Pt demonstrates increased R lateral sway/lean with ambulation (pt with h/o leg length discrepancy with R LE shorter than L LE and pt does not have any orthotics he uses to correct it) and would benefit from use of SPC to improve balance initially (pt in agreement with recommendation).  Pt would benefit from skilled PT to address noted impairments and functional limitations during hospital stay.  Anticipate with continued mobility and use of SPC pt will be able to discharge home safely without any further PT services.     Follow Up Recommendations No PT follow up    Equipment Recommendations   (pt already owns [facility])    Recommendations for Other Services       Precautions / Restrictions Precautions Precautions: Fall Restrictions Weight Bearing Restrictions: No       Mobility  Bed Mobility Overal bed mobility: Modified Independent             General bed mobility comments: Supine to sit with HOB elevated  Transfers Overall transfer level: Needs assistance Equipment used: None Transfers: Sit to/from Stand Sit to Stand: Supervision         General transfer comment: steady without loss of balance  Ambulation/Gait Ambulation/Gait assistance: Min guard Ambulation Distance (Feet): 230 Feet Assistive device: None     Gait velocity interpretation: >2.62 ft/sec, indicative of independent community ambulator General Gait Details: increased R lateral lean during R stance phase (pt with R LE shorter than L LE s/p multiple surgeries on R LE); step through gait pattern  Stairs            Wheelchair Mobility    Modified Rankin (Stroke Patients Only)       Balance Overall balance assessment: Needs assistance Sitting-balance support: No upper extremity supported;Feet supported Sitting balance-Leahy Scale: Normal     Standing balance support: No upper extremity supported;During functional activity Standing balance-Leahy Scale: Good Standing balance comment: increased R lateral lean during R stance phase                             Pertinent Vitals/Pain Pain Assessment: No/denies pain    Home Living Family/patient expects to be discharged to:: Private residence Living Arrangements: Children   Type of Home: House Home Access: Ramped entrance     Home Layout: One level Home Equipment: Cane - single  point      Prior Function Level of Independence: Independent with assistive device(s)         Comments: Pt normally ambulates without AD but will use his cane if one of his LE's bother him.  Pt has leg length discrepancy (R shorter than L) and does not use any orthotic to correct it so always has a R lateral lean with standing and walking.     Hand Dominance        Extremity/Trunk Assessment   Upper  Extremity Assessment: Overall WFL for tasks assessed           Lower Extremity Assessment: Overall WFL for tasks assessed      Cervical / Trunk Assessment: Normal  Communication   Communication: No difficulties  Cognition Arousal/Alertness: Awake/alert Behavior During Therapy: WFL for tasks assessed/performed Overall Cognitive Status: Within Functional Limits for tasks assessed                      General Comments  R LE leg length shorter than L LE    Exercises General Exercises - Lower Extremity Ankle Circles/Pumps: AROM;Strengthening;Both;10 reps;Supine Quad Sets: AROM;Strengthening;Both;10 reps;Supine Short Arc Quad: AROM;Strengthening;Both;10 reps;Supine Heel Slides: AROM;Strengthening;Both;10 reps;Supine Hip ABduction/ADduction: AROM;Strengthening;Both;10 reps;Supine      Assessment/Plan    PT Assessment Patient needs continued PT services  PT Diagnosis Abnormality of gait   PT Problem List Decreased balance  PT Treatment Interventions DME instruction;Gait training;Functional mobility training;Therapeutic activities;Therapeutic exercise;Balance training;Patient/family education   PT Goals (Current goals can be found in the Care Plan section) Acute Rehab PT Goals Patient Stated Goal: to go home and take care of his daughter PT Goal Formulation: With patient Time For Goal Achievement: 08/13/15 Potential to Achieve Goals: Good    Frequency Min 2X/week   Barriers to discharge        Co-evaluation               End of Session Equipment Utilized During Treatment: Gait belt Activity Tolerance: Patient tolerated treatment well Patient left: in chair;with call bell/phone within reach;with chair alarm set Nurse Communication: Mobility status;Precautions         Time: [id number] PT Time Calculation (min) (ACUTE ONLY): 20 min   Charges:   PT Evaluation $PT Eval Low Complexity: 1 Procedure PT Treatments $Therapeutic Exercise: 8-22 mins    PT G CodesRaquel Sarna Nizhoni Parlow 08-13-2015, 12:13 PM

## 2015-07-31 DIAGNOSIS — R112 Nausea with vomiting, unspecified: Secondary | ICD-10-CM

## 2015-07-31 DIAGNOSIS — E860 Dehydration: Secondary | ICD-10-CM

## 2015-07-31 DIAGNOSIS — R531 Weakness: Secondary | ICD-10-CM

## 2015-07-31 DIAGNOSIS — R1310 Dysphagia, unspecified: Secondary | ICD-10-CM

## 2015-07-31 DIAGNOSIS — A048 Other specified bacterial intestinal infections: Secondary | ICD-10-CM

## 2015-07-31 LAB — BASIC METABOLIC PANEL
Anion gap: 3 — ABNORMAL LOW (ref 5–15)
BUN: 20 mg/dL (ref 6–20)
CHLORIDE: 112 mmol/L — AB (ref 101–111)
CO2: 26 mmol/L (ref 22–32)
Calcium: 8.3 mg/dL — ABNORMAL LOW (ref 8.9–10.3)
Creatinine, Ser: 1.14 mg/dL (ref 0.61–1.24)
Glucose, Bld: 60 mg/dL — ABNORMAL LOW (ref 65–99)
POTASSIUM: 4.5 mmol/L (ref 3.5–5.1)
SODIUM: 141 mmol/L (ref 135–145)

## 2015-07-31 LAB — URINALYSIS COMPLETE WITH MICROSCOPIC (ARMC ONLY)
BILIRUBIN URINE: NEGATIVE
Bacteria, UA: NONE SEEN
Hgb urine dipstick: NEGATIVE
KETONES UR: NEGATIVE mg/dL
LEUKOCYTES UA: NEGATIVE
Nitrite: NEGATIVE
PH: 6 (ref 5.0–8.0)
PROTEIN: NEGATIVE mg/dL
RBC / HPF: NONE SEEN RBC/hpf (ref 0–5)
SQUAMOUS EPITHELIAL / LPF: NONE SEEN
Specific Gravity, Urine: 1.005 (ref 1.005–1.030)
WBC, UA: NONE SEEN WBC/hpf (ref 0–5)

## 2015-07-31 LAB — GLUCOSE, CAPILLARY
GLUCOSE-CAPILLARY: 48 mg/dL — AB (ref 65–99)
GLUCOSE-CAPILLARY: 85 mg/dL (ref 65–99)
Glucose-Capillary: 228 mg/dL — ABNORMAL HIGH (ref 65–99)

## 2015-07-31 MED ORDER — INSULIN GLARGINE 100 UNIT/ML ~~LOC~~ SOLN
25.0000 [IU] | Freq: Every evening | SUBCUTANEOUS | Status: DC
  Filled 2015-07-31: qty 0.25

## 2015-07-31 MED ORDER — CLARITHROMYCIN 500 MG PO TABS: 500.0000 mg | ORAL_TABLET | Freq: Two times a day (BID) | ORAL | 0 refills | Status: DC

## 2015-07-31 MED ORDER — LANTUS 100 UNIT/ML ~~LOC~~ SOLN: 25.0000 [IU] | Freq: Every evening | SUBCUTANEOUS | 3 refills | Status: DC

## 2015-07-31 MED ORDER — GLUCOSE 5 G PO CHEW: 15.0000 g | CHEWABLE_TABLET | ORAL | 12 refills | Status: DC | PRN

## 2015-07-31 MED ORDER — SENNOSIDES-DOCUSATE SODIUM 8.6-50 MG PO TABS: 1.0000 | ORAL_TABLET | Freq: Every evening | ORAL | 4 refills | Status: DC | PRN

## 2015-07-31 MED ORDER — AMOXICILLIN 500 MG PO CAPS: 1000.0000 mg | ORAL_CAPSULE | Freq: Two times a day (BID) | ORAL | 0 refills | Status: DC

## 2015-07-31 MED ORDER — PANTOPRAZOLE SODIUM 40 MG PO TBEC: 40.0000 mg | DELAYED_RELEASE_TABLET | Freq: Two times a day (BID) | ORAL | 4 refills | Status: DC

## 2015-07-31 MED ORDER — DOCUSATE SODIUM 100 MG PO CAPS: 100.0000 mg | ORAL_CAPSULE | Freq: Two times a day (BID) | ORAL | 0 refills | Status: DC

## 2015-07-31 MED ORDER — POLYETHYLENE GLYCOL 3350 17 G PO PACK: 17.0000 g | PACK | Freq: Every day | ORAL | 0 refills | Status: DC | PRN

## 2015-07-31 NOTE — Care Management (Signed)
No discharge needs identified by attending or other members of the care team.

## 2015-07-31 NOTE — Plan of Care (Signed)
Problem: Bowel/Gastric: Goal: Will not experience complications related to bowel motility Outcome: Progressing No hypoglycemic episodes this shift.  Tolerated HS snack ([place] sandwich) without complaints.

## 2015-07-31 NOTE — Progress Notes (Signed)
Hypoglycemic Event  CBG: 48  Treatment: juice  Symptoms: none Follow-up CBG: Time:0749 CBG Result 85 Possible Reasons for Event: unknown  Comments/MD notified:Dr. Tiajuana Amass

## 2015-07-31 NOTE — Discharge Summary (Signed)
[facility] [place] NAME: [name]    MR#:  [id number]  DATE OF BIRTH:  10/18/1940  DATE OF ADMISSION:  07/28/2015 ADMITTING PHYSICIAN: Max Sane, MD  DATE OF DISCHARGE: No discharge date for patient encounter.  PRIMARY CARE PHYSICIAN: Valera Castle, MD     ADMISSION DIAGNOSIS:  Hypotension, unspecified [I95.9] Hypomagnesemia [E83.42] Volume depletion, gastrointestinal loss [E86.9] Acute renal failure, unspecified acute renal failure type ([place]) [N17.9]  DISCHARGE DIAGNOSIS:  Principal Problem:   Acute kidney injury ([place]) Active Problems:   Nausea and vomiting   H. pylori infection   Odynophagia   Dehydration   Generalized weakness   SECONDARY DIAGNOSIS:   Past Medical History:  Diagnosis Date  . BPH (benign prostatic hyperplasia)   . CAD (coronary artery disease)    a. 10/2014 Ant STEMI/PCI: LM nl, LAD 161m (2.75x38 Xience DES) - R->L collats, D1 60, D2 90ost, LCX 20ost, OM1/2/3 min irregs, RCA 28m (2.75x15 Xience DES), 50d, RPDA small;  b. 10/2014 Echo: EF 55-60%, mod ant/ap HK, Gr1 DD.  . Diabetes mellitus without complication ([place])   . Essential hypertension   . Hyperlipidemia   . Osteoarthritis     .pro HOSPITAL COURSE:  Patient is 75 year old Caucasian male with past medical history significant for history of hypertension, coronary artery disease, diabetes mellitus, hyperlipidemia, who presents to the hospital with complaints of abnormal lab studies. On arrival to emergency room, he was complaining of nausea, vomiting, feeling unwell, dizzy, weak, going on for the past 2 or 3 weeks, also constipation as well as indigestion, his labs were drawn and they were found to be abnormal,  patient's creatinine was found to be 2.44, magnesium 1.5, hemoglobin level of 12.7. Patient admited of intermittent nausea and vomiting episodes, pain in the right side of chest with food intake. Patient underwent barium  swallowing study , which revealed presbyesophagus, small hiatal hernia, but no other abnormalities . Gastric emptying study could not have been done due to Reglan use.  H. pylori testing was done and it was found to be abnormal, revealing elevated levels of IgA and IgG, patient was initiated on Protonix twice daily, amoxicillin, clarithromycin, he is to continue these medications for 2 weeks to complete course. Patient was also dehydrated and his kidney function improved. His oral INTAKE also improved. Patient had an enema, last bowel movement was 27th of July 2017. The patient was prescribed numerous medications for constipation. Patient was seen by physical therapist while in the hospital, but was not recommended home health services Discussion by problem:  1. Acute renal failure, likely due to dehydration, poor oral intake, intermittent nausea, vomiting, resolved  with IV fluid administration. It is recommended to follow patient's kidney function periodically. #2. Intermittent nausea and vomiting, could be related to gastroparesis, constipation, H. pylori infection, continue Reglan at current doses, patient would benefit from a gastric emptying study as outpatient, some improved clinically . The patient was constipated in the hospital despite numerous medications, enema  was  administered 27th of July, patient did have bowel movement, it is recommended to follow patient's ability to defecate intermittently at home, he was recommended to continue Colace, senna, MiraLAX   #3, constipation, continue Colace , senna, MiraLAX, enema resulted in bowel movement #4 . Right-sided chest pain with food intake, odynophagia, barium swallowing study revealed the presbyesophagus, hiatal hernia, continue Protonix twice a day as part of H. pylori therapy, no symptomatology at present  #5 Diabetes mellitus  with hypoglycemia in the morning, cut down Lantus to 25 units subcutaneously at bedtime, follow blood glucose levels as  outpatient, following oral intake closely, prescribed glucose tablets #6. Generalized weakness, physical therapist evaluate patient, while in the hospital, no home health needs were identified #7, H. pylori infection, patient is to continue Protonix twice daily, amoxicillin, clarithromycin for 2 weeks to complete course, I suspect H. pylori infection was responsible for intermittent nausea and vomiting, possible odynophagia as well, follow clinically, get gastroenterology consultation if needed DISCHARGE CONDITIONS:   Stable  CONSULTS OBTAINED:    DRUG ALLERGIES:  No Known Allergies  DISCHARGE MEDICATIONS:   Current Discharge Medication List    START taking these medications   Details  amoxicillin (AMOXIL) 500 MG capsule Take 2 capsules (1,000 mg total) by mouth every 12 (twelve) hours. Qty: 28 capsule, Refills: 0    clarithromycin (BIAXIN) 500 MG tablet Take 1 tablet (500 mg total) by mouth every 12 (twelve) hours. Qty: 28 tablet, Refills: 0    docusate sodium (COLACE) 100 MG capsule Take 1 capsule (100 mg total) by mouth 2 (two) times daily. Qty: 10 capsule, Refills: 0    glucose (B-D GLUCOSE) 5 g chewable tablet Chew 3 tablets (15 g total) by mouth as needed for low blood sugar. Qty: 50 tablet, Refills: 12    polyethylene glycol (MIRALAX / GLYCOLAX) packet Take 17 g by mouth daily as needed for mild constipation or moderate constipation. Qty: 14 each, Refills: 0    senna-docusate (SENOKOT-S) 8.6-50 MG tablet Take 1 tablet by mouth at bedtime as needed for mild constipation. Qty: 30 tablet, Refills: 4      CONTINUE these medications which have CHANGED   Details  LANTUS 100 UNIT/ML injection Inject 0.25 mLs (25 Units total) into the skin every evening. Qty: 10 mL, Refills: 3    pantoprazole (PROTONIX) 40 MG tablet Take 1 tablet (40 mg total) by mouth 2 (two) times daily. Qty: 60 tablet, Refills: 4      CONTINUE these medications which have NOT CHANGED   Details   aspirin 81 MG tablet Take 81 mg by mouth daily.    atorvastatin (LIPITOR) 40 MG tablet Take 1 tablet (40 mg total) by mouth daily at 6 PM. Qty: 30 tablet, Refills: 6    carvedilol (COREG) 3.125 MG tablet Take 1 tablet (3.125 mg total) by mouth 2 (two) times daily with a meal. Qty: 60 tablet, Refills: 5    Dulaglutide (TRULICITY) [id number] SOPN Inject 0.75 mg into the skin every 7 (seven) days.    fluticasone (FLONASE) 50 MCG/ACT nasal spray Place 2 sprays into both nostrils daily.    gabapentin (NEURONTIN) 100 MG capsule Take 100-300 mg by mouth 2 (two) times daily. Take 100mg  twice daily for 7 days, 200mg  twice daily for 7 days, then 300mg  twice daily thereafter    glucagon (GLUCAGON EMERGENCY) 1 MG injection Inject 1 mg into the vein once as needed.    insulin aspart (NOVOLOG) 100 UNIT/ML injection Inject 8 Units into the skin 3 (three) times daily before meals. Qty: 10 mL, Refills: 11    lisinopril (PRINIVIL,ZESTRIL) 40 MG tablet Take 1 tablet (40 mg total) by mouth daily. Qty: 90 tablet, Refills: 3    metFORMIN (GLUCOPHAGE) 500 MG tablet Take 1 tablet by mouth 2 (two) times daily.    metoCLOPramide (REGLAN) 5 MG tablet Take 5 mg by mouth 2 (two) times daily. For 10 days    nitroGLYCERIN (NITROSTAT) 0.4 MG SL tablet  Place 1 tablet (0.4 mg total) under the tongue every 5 (five) minutes as needed for chest pain. Qty: 30 tablet, Refills: 2    ondansetron (ZOFRAN ODT) 4 MG disintegrating tablet Take 1 tablet (4 mg total) by mouth every 8 (eight) hours as needed for nausea or vomiting. Qty: 8 tablet, Refills: 0    ticagrelor (BRILINTA) 90 MG TABS tablet Take 1 tablet (90 mg total) by mouth 2 (two) times daily. Qty: 60 tablet, Refills: 5      STOP taking these medications     meloxicam (MOBIC) 7.5 MG tablet          DISCHARGE INSTRUCTIONS:    Patient is to follow-up with primary care physician as outpatient  If you experience worsening of your admission symptoms,  develop shortness of breath, life threatening emergency, suicidal or homicidal thoughts you must seek medical attention immediately by calling 911 or calling your MD immediately  if symptoms less severe.  You Must read complete instructions/literature along with all the possible adverse reactions/side effects for all the Medicines you take and that have been prescribed to you. Take any new Medicines after you have completely understood and accept all the possible adverse reactions/side effects.   Please note  You were cared for by a hospitalist during your hospital stay. If you have any questions about your discharge medications or the care you received while you were in the hospital after you are discharged, you can call the unit and asked to speak with the hospitalist on call if the hospitalist that took care of you is not available. Once you are discharged, your primary care physician will handle any further medical issues. Please note that NO REFILLS for any discharge medications will be authorized once you are discharged, as it is imperative that you return to your primary care physician (or establish a relationship with a primary care physician if you do not have one) for your aftercare needs so that they can reassess your need for medications and monitor your lab values.    Today   CHIEF COMPLAINT:   Chief Complaint  Patient presents with  . Abnormal Lab  . Hypotension    HISTORY OF PRESENT ILLNESS:  [name]  is a 75 y.o. male with a known history of hypertension, coronary artery disease, diabetes mellitus, hyperlipidemia, who presents to the hospital with complaints of abnormal lab studies. On arrival to emergency room, he was complaining of nausea, vomiting, feeling unwell, dizzy, weak, going on for the past 2 or 3 weeks, also constipation as well as indigestion, his labs were drawn and they were found to be abnormal,  patient's creatinine was found to be 2.44, magnesium 1.5,  hemoglobin level of 12.7. Patient admited of intermittent nausea and vomiting episodes, pain in the right side of chest with food intake. Patient underwent barium swallowing study , which revealed presbyesophagus, small hiatal hernia, but no other abnormalities . Gastric emptying study could not have been done due to Reglan use.  H. pylori testing was done and it was found to be abnormal, revealing elevated levels of IgA and IgG, patient was initiated on Protonix twice daily, amoxicillin, clarithromycin, he is to continue these medications for 2 weeks to complete course. Patient was also dehydrated and his kidney function improved. His oral INTAKE also improved. Patient had an enema, last bowel movement was 27th of July 2017. The patient was prescribed numerous medications for constipation. Patient was seen by physical therapist while in the hospital, but was  not recommended home health services Discussion by problem:  1. Acute renal failure, likely due to dehydration, poor oral intake, intermittent nausea, vomiting, resolved  with IV fluid administration. It is recommended to follow patient's kidney function periodically. #2. Intermittent nausea and vomiting, could be related to gastroparesis, constipation, H. pylori infection, continue Reglan at current doses, patient would benefit from a gastric emptying study as outpatient, some improved clinically . The patient was constipated in the hospital despite numerous medications, enema  was  administered 27th of July, patient did have bowel movement, it is recommended to follow patient's ability to defecate intermittently at home, he was recommended to continue Colace, senna, MiraLAX   #3, constipation, continue Colace , senna, MiraLAX, enema resulted in bowel movement #4 . Right-sided chest pain with food intake, odynophagia, barium swallowing study revealed the presbyesophagus, hiatal hernia, continue Protonix twice a day as part of H. pylori therapy, no  symptomatology at present  #5 Diabetes mellitus with hypoglycemia in the morning, cut down Lantus to 25 units subcutaneously at bedtime, follow blood glucose levels as outpatient, following oral intake closely, prescribed glucose tablets #6. Generalized weakness, physical therapist evaluate patient, while in the hospital, no home health needs were identified #7, H. pylori infection, patient is to continue Protonix twice daily, amoxicillin, clarithromycin for 2 weeks to complete course, I suspect H. pylori infection was responsible for intermittent nausea and vomiting, possible odynophagia as well, follow clinically, get gastroenterology consultation if needed    VITAL SIGNS:  Blood pressure (!) 134/55, pulse 64, temperature 98 F (36.7 C), temperature source Oral, resp. rate 18, height 5\' 9"  (1.753 m), weight 73 kg (160 lb 14.4 oz), SpO2 99 %.  I/O:    Intake/Output Summary (Last 24 hours) at 07/31/15 1215 Last data filed at 07/31/15 0746  Gross per 24 hour  Intake          2343.33 ml  Output             3050 ml  Net          -706.67 ml    PHYSICAL EXAMINATION:  GENERAL:  75 y.o.-year-old patient lying in the bed with no acute distress.  EYES: Pupils equal, round, reactive to light and accommodation. No scleral icterus. Extraocular muscles intact.  HEENT: Head atraumatic, normocephalic. Oropharynx and nasopharynx clear.  NECK:  Supple, no jugular venous distention. No thyroid enlargement, no tenderness.  LUNGS: Normal breath sounds bilaterally, no wheezing, rales,rhonchi or crepitation. No use of accessory muscles of respiration.  CARDIOVASCULAR: S1, S2 normal. No murmurs, rubs, or gallops.  ABDOMEN: Soft, non-tender, non-distended. Bowel sounds present. No organomegaly or mass.  EXTREMITIES: No pedal edema, cyanosis, or clubbing.  NEUROLOGIC: Cranial nerves II through XII are intact. Muscle strength 5/5 in all extremities. Sensation intact. Gait not checked.  PSYCHIATRIC: The patient  is alert and oriented x 3.  SKIN: No obvious rash, lesion, or ulcer.   DATA REVIEW:   CBC  Recent Labs Lab 07/29/15 0330 07/30/15 0443  WBC 6.4  --   HGB 12.2* 12.1*  HCT 36.4*  --   PLT 159  --     Chemistries   Recent Labs Lab 07/29/15 0330  07/31/15 0515  NA 138  < > 141  K 4.3  < > 4.5  CL 104  < > 112*  CO2 29  < > 26  GLUCOSE 291*  < > 60*  BUN 33*  < > 20  CREATININE 1.91*  < > 1.14  CALCIUM 8.6*  < > 8.3*  MG 2.1  --   --   AST 17  --   --   ALT 15*  --   --   ALKPHOS 55  --   --   BILITOT 0.5  --   --   < > = values in this interval not displayed.  Cardiac Enzymes  Recent Labs Lab 07/28/15 1727  TROPONINI <0.03    Microbiology Results  Results for orders placed or performed during the hospital encounter of 10/20/14  MRSA PCR Screening     Status: None   Collection Time: 10/20/14 10:55 AM  Result Value Ref Range Status   MRSA by PCR NEGATIVE NEGATIVE Final    Comment:        The GeneXpert MRSA Assay (FDA approved for NASAL specimens only), is one component of a comprehensive MRSA colonization surveillance program. It is not intended to diagnose MRSA infection nor to guide or monitor treatment for MRSA infections.     RADIOLOGY:  Dg Esophagus  Result Date: 07/30/2015 CLINICAL DATA:  Patient reports sharp right-sided chest pain when eating; history of diabetes, recent renal failure, coronary artery disease. EXAM: ESOPHOGRAM / BARIUM SWALLOW / BARIUM TABLET STUDY TECHNIQUE: Combined double contrast and single contrast examination performed using effervescent crystals, thick barium liquid, and thin barium liquid. The patient was observed with fluoroscopy swallowing a 13 mm barium sulphate tablet. FLUOROSCOPY TIME:  Fluoroscopy Time:  0 minutes, 54 seconds Number of Acquired Images:  11 COMPARISON:  None in PACs FINDINGS: The patient ingested thick and thin barium and the gas-forming crystals without difficulty. The cervical esophagus distended  well. There was no laryngeal penetration of the barium. The thoracic esophagus also distended well. A small reducible hiatal hernia was observed. No gastroesophageal reflux was demonstrated. There was no evidence of stricture nor esophagitis. Intermittent tertiary contractions were observed. The barium tablet passed promptly for mouth to the stomach. IMPRESSION: Small reducible hiatal hernia without evidence of reflux, stricture, or esophagitis. Intermittent tertiary contractions consistent with presbyesophagus. No laryngeal penetration of the barium. The patient did not experience is typical sharp right-sided anterior chest discomfort during this study. Electronically Signed   By: David  Martinique M.D.   On: 07/30/2015 09:08   EKG:   Orders placed or performed during the hospital encounter of 07/28/15  . EKG 12-Lead  . EKG 12-Lead      Management plans discussed with the patient, family and they are in agreement.  CODE STATUS:     Code Status Orders        Start     Ordered   07/28/15 2238  Full code  Continuous     07/28/15 2237    Code Status History    Date Active Date Inactive Code Status Order ID Comments User Context   10/20/2014 10:38 AM 10/22/2014  2:38 PM Full Code [id number]  Wellington Hampshire, MD Inpatient      TOTAL TIME TAKING CARE OF THIS PATIENT: 40 minutes.    Theodoro Grist M.D on 07/31/2015 at 12:15 PM  Between 7am to 6pm - Pager - [phone]  After 6pm go to [email or web address] - password [email or web address] [facility]  Office  [phone]  CC: Primary care physician; Valera Castle, MD

## 2015-07-31 NOTE — Progress Notes (Signed)
Patient discharged to home, transported by his daughter. New medication and changes to insulin doses reviewed with him and noted on the AVS

## 2015-07-31 NOTE — Progress Notes (Signed)
Inpatient Diabetes Program Recommendations  AACE/ADA: New Consensus Statement on Inpatient Glycemic Control (2015)  Target Ranges:  Prepandial:   less than 140 mg/dL      Peak postprandial:   less than 180 mg/dL (1-2 hours)      Critically ill patients:  140 - 180 mg/dL   Results for [name], [name] (MRN [id number]) as of 07/31/2015 10:26  Ref. Range 07/30/2015 07:32 07/30/2015 08:04 07/30/2015 12:05 07/30/2015 12:42 07/30/2015 16:32 07/30/2015 20:43 07/31/2015 07:19 07/31/2015 07:49  Glucose-Capillary Latest Ref Range: 65 - 99 mg/dL 43 (LL) 146 (H) 49 (L) 104 (H) 122 (H) 201 (H) 48 (L) 85   Review of Glycemic Control  Diabetes history: DM2 Outpatient Diabetes medications: Trulicity A999333 mg Qweek, Lantus 65 units QPM, Novolog 20 TID, Metformin 500 mg BID Current orders for Inpatient glycemic control: Lantus 25 units QPM, Novolog 123456 units ACHS, Trulicity A999333 mg 123456  Inpatient Diabetes Program Recommendations: Insulin - Basal: Fasting glucose 48 mg/dl this morning. Noted Lantus was decreased from 40 units to 25 units QPM.  NOTE: Patient received Lantus 65 units on 07/29/15 and experienced hypoglycemia on 07/30/15. Then Lantus was decreased from 65 units to 40 units on 07/30/15 and patient received Lantus 40 units on 07/30/15. Fasting glucose 48 mg/dl this morning and noted Lantus has already been decreased today from 40 units to 25 units QPM. Will continue to follow along.  Thanks, Barnie Alderman, RN, MSN, CDE Diabetes Coordinator Inpatient Diabetes Program [phone] (Team Pager from [place] to [place]) [phone] ([facility] office) [phone] [facility] office) [phone] [facility] office)

## 2016-02-11 ENCOUNTER — Ambulatory Visit: Payer: Medicare Other | Admitting: Cardiovascular Disease

## 2016-02-11 NOTE — Progress Notes (Deleted)
Cardiology Office Note   Date:  02/11/2016   ID:  BEKIM [name], DOB Jun 24, 1940, MRN [id number]  PCP:  Charles Castle, MD  Cardiologist:   Kathlyn Sacramento, MD   No chief complaint on file.     History of Present Illness: [name] is a 76 y.o. male who presents for A follow-up visit regarding coronary artery disease .  Has a history of diabetes and hypertension. He presented in October 2016 with anterior ST elevation myocardial infarction. Emergent cardiac catheterization showed occluded mid LAD as well as severe mid right coronary artery disease. Both areas were successfully stented using drug eluting stents. Echocardiography showed normal LV function.  Since his discharge, he has done well. He works at a [employer] in [employer].  He denies any chest pain or shortness of breath. He is taking his medications regularly with no reported side effects.   Past Medical History:  Diagnosis Date  . BPH (benign prostatic hyperplasia)   . CAD (coronary artery disease)    a. 10/2014 Ant STEMI/PCI: LM nl, LAD 175m (2.75x38 Xience DES) - R->L collats, D1 60, D2 90ost, LCX 20ost, OM1/2/3 min irregs, RCA 10m (2.75x15 Xience DES), 50d, RPDA small;  b. 10/2014 Echo: EF 55-60%, mod ant/ap HK, Gr1 DD.  . Diabetes mellitus without complication ([place])   . Essential hypertension   . Hyperlipidemia   . Osteoarthritis     Past Surgical History:  Procedure Laterality Date  . CARDIAC CATHETERIZATION N/A 10/20/2014   Procedure: Left Heart Cath and Coronary Angiography;  Surgeon: Wellington Hampshire, MD;  Location: [place] [facility];  Service: Cardiovascular;  Laterality: N/A;  . CARDIAC CATHETERIZATION Right 10/20/2014   Procedure: Coronary Stent Intervention;  Surgeon: Wellington Hampshire, MD;  Location: [place] [facility];  Service: Cardiovascular;  Laterality: Right;  LAD/RCA Stent placement     Current Outpatient Prescriptions  Medication Sig Dispense Refill  .  amoxicillin (AMOXIL) 500 MG capsule Take 2 capsules (1,000 mg total) by mouth every 12 (twelve) hours. 28 capsule 0  . aspirin 81 MG tablet Take 81 mg by mouth daily.    Marland Kitchen atorvastatin (LIPITOR) 40 MG tablet Take 1 tablet (40 mg total) by mouth daily at 6 PM. 30 tablet 6  . carvedilol (COREG) 3.125 MG tablet Take 1 tablet (3.125 mg total) by mouth 2 (two) times daily with a meal. 60 tablet 5  . clarithromycin (BIAXIN) 500 MG tablet Take 1 tablet (500 mg total) by mouth every 12 (twelve) hours. 28 tablet 0  . docusate sodium (COLACE) 100 MG capsule Take 1 capsule (100 mg total) by mouth 2 (two) times daily. 10 capsule 0  . Dulaglutide (TRULICITY) A999333 0000000 SOPN Inject 0.75 mg into the skin every 7 (seven) days.    . fluticasone (FLONASE) 50 MCG/ACT nasal spray Place 2 sprays into both nostrils daily.    Marland Kitchen gabapentin (NEURONTIN) 100 MG capsule Take 100-300 mg by mouth 2 (two) times daily. Take 100mg  twice daily for 7 days, 200mg  twice daily for 7 days, then 300mg  twice daily thereafter    . glucagon (GLUCAGON EMERGENCY) 1 MG injection Inject 1 mg into the vein once as needed.    Marland Kitchen glucose (B-D GLUCOSE) 5 g chewable tablet Chew 3 tablets (15 g total) by mouth as needed for low blood sugar. 50 tablet 12  . insulin aspart (NOVOLOG) 100 UNIT/ML injection Inject 8 Units into the skin 3 (three) times daily before meals. (Patient taking  differently: Inject 20 Units into the skin 3 (three) times daily before meals. ) 10 mL 11  . LANTUS 100 UNIT/ML injection Inject 0.25 mLs (25 Units total) into the skin every evening. 10 mL 3  . lisinopril (PRINIVIL,ZESTRIL) 40 MG tablet Take 1 tablet (40 mg total) by mouth daily. 90 tablet 3  . metoCLOPramide (REGLAN) 5 MG tablet Take 5 mg by mouth 2 (two) times daily. For 10 days    . nitroGLYCERIN (NITROSTAT) 0.4 MG SL tablet Place 1 tablet (0.4 mg total) under the tongue every 5 (five) minutes as needed for chest pain. 30 tablet 2  . ondansetron (ZOFRAN ODT) 4 MG  disintegrating tablet Take 1 tablet (4 mg total) by mouth every 8 (eight) hours as needed for nausea or vomiting. 8 tablet 0  . pantoprazole (PROTONIX) 40 MG tablet Take 1 tablet (40 mg total) by mouth 2 (two) times daily. 60 tablet 4  . polyethylene glycol (MIRALAX / GLYCOLAX) packet Take 17 g by mouth daily as needed for mild constipation or moderate constipation. 14 each 0  . senna-docusate (SENOKOT-S) 8.6-50 MG tablet Take 1 tablet by mouth at bedtime as needed for mild constipation. 30 tablet 4  . ticagrelor (BRILINTA) 90 MG TABS tablet Take 1 tablet (90 mg total) by mouth 2 (two) times daily. 60 tablet 5   No current facility-administered medications for this visit.     Allergies:   Patient has no known allergies.    Social History:  The patient  reports that he has never smoked. He has never used smokeless tobacco. He reports that he does not drink alcohol or use drugs.      ROS:  Please see the history of present illness.   Otherwise, review of systems are positive for none.   All other systems are reviewed and negative.    PHYSICAL EXAM: VS:  There were no vitals taken for this visit. , BMI There is no height or weight on file to calculate BMI. GEN: Well nourished, well developed, in no acute distress HEENT: normal Neck: no JVD, carotid bruits, or masses Cardiac: RRR; no murmurs, rubs, or gallops,no edema  Respiratory:  clear to auscultation bilaterally, normal work of breathing GI: soft, nontender, nondistended, + BS MS: no deformity or atrophy Skin: warm and dry, no rash Neuro:  Strength and sensation are intact Psych: euthymic mood, full affect   EKG:  EKG is not ordered today.    Recent Labs: 07/29/2015: ALT 15; Magnesium 2.1; Platelets 159 07/30/2015: Hemoglobin 12.1 07/31/2015: BUN 20; Creatinine, Ser 1.14; Potassium 4.5; Sodium 141    Lipid Panel    Component Value Date/Time   CHOL 82 (L) 11/25/2014 0911   TRIG 52 11/25/2014 0911   HDL 44 11/25/2014 0911     CHOLHDL 1.9 11/25/2014 0911   CHOLHDL 3.3 10/21/2014 0518   VLDL 19 10/21/2014 0518   LDLCALC 28 11/25/2014 0911      Wt Readings from Last 3 Encounters:  07/28/15 160 lb 14.4 oz (73 kg)  07/20/15 170 lb (77.1 kg)  03/26/15 171 lb 12 oz (77.9 kg)        ASSESSMENT AND PLAN:  1.  Coronary artery disease involving native coronary arteries without angina: The patient is doing very well with no recurrent anginal symptoms. Continue aggressive medical therapy and dual antiplatelet therapy for at least until October 2017. At that point, I will most likely decreased the dose of Brilinta to 60 mg twice daily.  2. Essential hypertension:  Blood pressure is continues to be mildly elevated. Thus, I elected to increase the dose of lisinopril to 40 mg once daily.  3. Hyperlipidemia: Lipid profile showed that his LDL was down to 28. Thus, I decreased the dose of atorvastatin to 40 mg once daily.  4. Diabetes mellitus: Managed by primary care physician.    Disposition:   FU with me in 6 months  Signed,  Kathlyn Sacramento, MD  02/11/2016 8:36 AM    [name]

## 2016-02-12 ENCOUNTER — Encounter: Payer: Self-pay | Admitting: *Deleted

## 2016-04-26 ENCOUNTER — Other Ambulatory Visit: Payer: Self-pay | Admitting: Cardiovascular Disease

## 2016-05-24 ENCOUNTER — Encounter: Payer: Self-pay | Admitting: Cardiovascular Disease

## 2016-05-24 ENCOUNTER — Ambulatory Visit (INDEPENDENT_AMBULATORY_CARE_PROVIDER_SITE_OTHER): Payer: Medicare Other | Admitting: Cardiovascular Disease

## 2016-05-24 VITALS — BP 98/52 | HR 67 | Ht 69.0 in | Wt 153.8 lb

## 2016-05-24 DIAGNOSIS — I2510 Atherosclerotic heart disease of native coronary artery without angina pectoris: Secondary | ICD-10-CM | POA: Diagnosis not present

## 2016-05-24 DIAGNOSIS — E785 Hyperlipidemia, unspecified: Secondary | ICD-10-CM

## 2016-05-24 DIAGNOSIS — I10 Essential (primary) hypertension: Secondary | ICD-10-CM | POA: Diagnosis not present

## 2016-05-24 MED ORDER — CLOPIDOGREL BISULFATE 75 MG PO TABS: 75.0000 mg | ORAL_TABLET | Freq: Every day | ORAL | 2 refills | Status: DC

## 2016-05-24 NOTE — Patient Instructions (Signed)
Medication Instructions:  Your physician has recommended you make the following change in your medication:  STOP taking brilinta START taking plavix 75mg  once daily   Labwork: none  Testing/Procedures: none  Follow-Up: Your physician wants you to follow-up in: 6 months with Dr. Fletcher Anon.  You will receive a reminder letter in the mail two months in advance. If you don't receive a letter, please call our office to schedule the follow-up appointment.   Any Other Special Instructions Will Be Listed Below (If Applicable).     If you need a refill on your cardiac medications before your next appointment, please call your pharmacy.

## 2016-05-24 NOTE — Progress Notes (Signed)
Cardiology Office Note   Date:  05/24/2016   ID:  [name], DOB October 29, 1940, MRN [id number]  PCP:  Valera Castle, MD  Cardiologist:   Kathlyn Sacramento, MD   Chief Complaint  Patient presents with  . other    6 month follow up. Meds reviewed by the pt. verbally. Pt. c/o pain in ribs on the right side as well as indigestion.      History of Present Illness: [name] is a 76 y.o. male who presents for A follow-up visit regarding coronary artery disease .  Has a history of diabetes and hypertension. He presented in October 2016 with anterior ST elevation myocardial infarction. Emergent cardiac catheterization showed occluded mid LAD as well as severe mid right coronary artery disease. Both areas were successfully stented using drug eluting stents. Echocardiography showed normal LV function.  He had 2 hospitalizations last year for hyperglycemia and dehydration with acute renal failure. He has not been seen by me in over a year. He has not refilled his Brilinta. He denies any chest pain or shortness of breath. No orthopnea, PND or leg edema. He is not on any diuretics.   Past Medical History:  Diagnosis Date  . BPH (benign prostatic hyperplasia)   . CAD (coronary artery disease)    a. 10/2014 Ant STEMI/PCI: LM nl, LAD 159m (2.75x38 Xience DES) - R->L collats, D1 60, D2 90ost, LCX 20ost, OM1/2/3 min irregs, RCA 51m (2.75x15 Xience DES), 50d, RPDA small;  b. 10/2014 Echo: EF 55-60%, mod ant/ap HK, Gr1 DD.  . Diabetes mellitus without complication ([place])   . Essential hypertension   . Hyperlipidemia   . Osteoarthritis     Past Surgical History:  Procedure Laterality Date  . CARDIAC CATHETERIZATION N/A 10/20/2014   Procedure: Left Heart Cath and Coronary Angiography;  Surgeon: Wellington Hampshire, MD;  Location: [place] [facility];  Service: Cardiovascular;  Laterality: N/A;  . CARDIAC CATHETERIZATION Right 10/20/2014   Procedure: Coronary Stent Intervention;   Surgeon: Wellington Hampshire, MD;  Location: [place] [facility];  Service: Cardiovascular;  Laterality: Right;  LAD/RCA Stent placement     Current Outpatient Prescriptions  Medication Sig Dispense Refill  . amoxicillin (AMOXIL) 500 MG capsule Take 2 capsules (1,000 mg total) by mouth every 12 (twelve) hours. 28 capsule 0  . aspirin 81 MG tablet Take 81 mg by mouth daily.    Marland Kitchen atorvastatin (LIPITOR) 40 MG tablet TAKE 1 TABLET(40 MG) BY MOUTH DAILY AT 6 PM 30 tablet 0  . carvedilol (COREG) 3.125 MG tablet Take 1 tablet (3.125 mg total) by mouth 2 (two) times daily with a meal. 60 tablet 5  . clarithromycin (BIAXIN) 500 MG tablet Take 1 tablet (500 mg total) by mouth every 12 (twelve) hours. 28 tablet 0  . docusate sodium (COLACE) 100 MG capsule Take 1 capsule (100 mg total) by mouth 2 (two) times daily. 10 capsule 0  . Dulaglutide (TRULICITY) 3.32 RJ/1.8AC SOPN Inject 0.75 mg into the skin every 7 (seven) days.    . fluticasone (FLONASE) 50 MCG/ACT nasal spray Place 2 sprays into both nostrils daily.    Marland Kitchen gabapentin (NEURONTIN) 100 MG capsule Take 100-300 mg by mouth 2 (two) times daily. Take 100mg  twice daily for 7 days, 200mg  twice daily for 7 days, then 300mg  twice daily thereafter    . glucagon (GLUCAGON EMERGENCY) 1 MG injection Inject 1 mg into the vein once as needed.    Marland Kitchen glucose (B-D  GLUCOSE) 5 g chewable tablet Chew 3 tablets (15 g total) by mouth as needed for low blood sugar. 50 tablet 12  . insulin aspart (NOVOLOG) 100 UNIT/ML injection Inject 8 Units into the skin 3 (three) times daily before meals. (Patient taking differently: Inject 20 Units into the skin 3 (three) times daily before meals. ) 10 mL 11  . LANTUS 100 UNIT/ML injection Inject 0.25 mLs (25 Units total) into the skin every evening. 10 mL 3  . lisinopril (PRINIVIL,ZESTRIL) 40 MG tablet Take 1 tablet (40 mg total) by mouth daily. 90 tablet 3  . metoCLOPramide (REGLAN) 5 MG tablet Take 5 mg by mouth 2 (two) times daily. For  10 days    . nitroGLYCERIN (NITROSTAT) 0.4 MG SL tablet Place 1 tablet (0.4 mg total) under the tongue every 5 (five) minutes as needed for chest pain. 30 tablet 2  . ondansetron (ZOFRAN ODT) 4 MG disintegrating tablet Take 1 tablet (4 mg total) by mouth every 8 (eight) hours as needed for nausea or vomiting. 8 tablet 0  . pantoprazole (PROTONIX) 40 MG tablet Take 1 tablet (40 mg total) by mouth 2 (two) times daily. 60 tablet 4  . polyethylene glycol (MIRALAX / GLYCOLAX) packet Take 17 g by mouth daily as needed for mild constipation or moderate constipation. 14 each 0  . senna-docusate (SENOKOT-S) 8.6-50 MG tablet Take 1 tablet by mouth at bedtime as needed for mild constipation. 30 tablet 4  . ticagrelor (BRILINTA) 90 MG TABS tablet Take 1 tablet (90 mg total) by mouth 2 (two) times daily. 60 tablet 5   No current facility-administered medications for this visit.     Allergies:   Patient has no known allergies.    Social History:  The patient  reports that he has never smoked. He has never used smokeless tobacco. He reports that he does not drink alcohol or use drugs.      ROS:  Please see the history of present illness.   Otherwise, review of systems are positive for none.   All other systems are reviewed and negative.    PHYSICAL EXAM: VS:  BP (!) 98/52 (BP Location: Left Arm, Patient Position: Sitting, Cuff Size: Normal)   Pulse 67   Ht 5\' 9"  (1.753 m)   Wt 153 lb 12 oz (69.7 kg)   BMI 22.70 kg/m  , BMI Body mass index is 22.7 kg/m. GEN: Well nourished, well developed, in no acute distress  HEENT: normal  Neck: no JVD, carotid bruits, or masses Cardiac: RRR; no murmurs, rubs, or gallops,no edema  Respiratory:  clear to auscultation bilaterally, normal work of breathing GI: soft, nontender, nondistended, + BS MS: no deformity or atrophy  Skin: warm and dry, no rash Neuro:  Strength and sensation are intact Psych: euthymic mood, full affect   EKG:  EKG is ordered  today. EKG showed normal sinus rhythm with sinus arrhythmia. No evidence of prior infarcts.   Recent Labs: 07/29/2015: ALT 15; Magnesium 2.1; Platelets 159 07/30/2015: Hemoglobin 12.1 07/31/2015: BUN 20; Creatinine, Ser 1.14; Potassium 4.5; Sodium 141    Lipid Panel    Component Value Date/Time   CHOL 82 (L) 11/25/2014 0911   TRIG 52 11/25/2014 0911   HDL 44 11/25/2014 0911   CHOLHDL 1.9 11/25/2014 0911   CHOLHDL 3.3 10/21/2014 0518   VLDL 19 10/21/2014 0518   LDLCALC 28 11/25/2014 0911      Wt Readings from Last 3 Encounters:  05/24/16 153 lb 12 oz (69.7  kg)  07/28/15 160 lb 14.4 oz (73 kg)  07/20/15 170 lb (77.1 kg)        ASSESSMENT AND PLAN:  1.  Coronary artery disease involving native coronary arteries without angina:  The patient has no anginal symptoms. I think he'll benefit from being on dual antiplatelet therapy and he was supposed to be taking Brilinta. I elected to add clopidogrel 75 mg once daily.  2. Essential hypertension: Blood pressure is  reasonably controlled on lisinopril and small dose carvedilol. If anything, it is on the low side but he appears to be asymptomatic.  3. Hyperlipidemia:  Continue atorvastatin. I reviewed her lipid profile from last year which was optimal.  4. Diabetes mellitus: Managed by primary care physician.    Disposition:   FU with me in 6 months  Signed,  Kathlyn Sacramento, MD  05/24/2016 2:55 PM    [place]

## 2016-06-15 ENCOUNTER — Encounter: Payer: Self-pay | Admitting: Emergency Medicine

## 2016-06-15 ENCOUNTER — Inpatient Hospital Stay
Admission: EM | Admit: 2016-06-15 | Discharge: 2016-06-18 | DRG: 922 | Disposition: A | Discharge status: Home Health | Payer: Medicare Other | Attending: Internal Medicine | Admitting: Internal Medicine

## 2016-06-15 ENCOUNTER — Emergency Department: Payer: Medicare Other

## 2016-06-15 DIAGNOSIS — E1159 Type 2 diabetes mellitus with other circulatory complications: Secondary | ICD-10-CM | POA: Diagnosis present

## 2016-06-15 DIAGNOSIS — Z833 Family history of diabetes mellitus: Secondary | ICD-10-CM

## 2016-06-15 DIAGNOSIS — Z791 Long term (current) use of non-steroidal anti-inflammatories (NSAID): Secondary | ICD-10-CM

## 2016-06-15 DIAGNOSIS — M6282 Rhabdomyolysis: Secondary | ICD-10-CM | POA: Diagnosis present

## 2016-06-15 DIAGNOSIS — L8990 Pressure ulcer of unspecified site, unspecified stage: Secondary | ICD-10-CM | POA: Insufficient documentation

## 2016-06-15 DIAGNOSIS — T670XXA Heatstroke and sunstroke, initial encounter: Principal | ICD-10-CM | POA: Diagnosis present

## 2016-06-15 DIAGNOSIS — X30XXXA Exposure to excessive natural heat, initial encounter: Secondary | ICD-10-CM

## 2016-06-15 DIAGNOSIS — Z794 Long term (current) use of insulin: Secondary | ICD-10-CM

## 2016-06-15 DIAGNOSIS — E785 Hyperlipidemia, unspecified: Secondary | ICD-10-CM | POA: Diagnosis present

## 2016-06-15 DIAGNOSIS — I214 Non-ST elevation (NSTEMI) myocardial infarction: Secondary | ICD-10-CM

## 2016-06-15 DIAGNOSIS — E860 Dehydration: Secondary | ICD-10-CM | POA: Diagnosis present

## 2016-06-15 DIAGNOSIS — Z79899 Other long term (current) drug therapy: Secondary | ICD-10-CM

## 2016-06-15 DIAGNOSIS — Z823 Family history of stroke: Secondary | ICD-10-CM

## 2016-06-15 DIAGNOSIS — Z7902 Long term (current) use of antithrombotics/antiplatelets: Secondary | ICD-10-CM

## 2016-06-15 DIAGNOSIS — G9340 Encephalopathy, unspecified: Secondary | ICD-10-CM | POA: Diagnosis present

## 2016-06-15 DIAGNOSIS — I252 Old myocardial infarction: Secondary | ICD-10-CM

## 2016-06-15 DIAGNOSIS — Z955 Presence of coronary angioplasty implant and graft: Secondary | ICD-10-CM

## 2016-06-15 DIAGNOSIS — I10 Essential (primary) hypertension: Secondary | ICD-10-CM | POA: Diagnosis present

## 2016-06-15 DIAGNOSIS — S8002XA Contusion of left knee, initial encounter: Secondary | ICD-10-CM | POA: Diagnosis present

## 2016-06-15 DIAGNOSIS — S40012A Contusion of left shoulder, initial encounter: Secondary | ICD-10-CM | POA: Diagnosis present

## 2016-06-15 DIAGNOSIS — S8001XA Contusion of right knee, initial encounter: Secondary | ICD-10-CM | POA: Diagnosis present

## 2016-06-15 DIAGNOSIS — L89152 Pressure ulcer of sacral region, stage 2: Secondary | ICD-10-CM | POA: Diagnosis present

## 2016-06-15 DIAGNOSIS — Y92009 Unspecified place in unspecified non-institutional (private) residence as the place of occurrence of the external cause: Secondary | ICD-10-CM

## 2016-06-15 DIAGNOSIS — I2510 Atherosclerotic heart disease of native coronary artery without angina pectoris: Secondary | ICD-10-CM | POA: Diagnosis present

## 2016-06-15 DIAGNOSIS — Z7982 Long term (current) use of aspirin: Secondary | ICD-10-CM

## 2016-06-15 LAB — TROPONIN I: Troponin I: 0.48 ng/mL (ref <0.03)

## 2016-06-15 LAB — ETHANOL: Alcohol, Ethyl (B): <5 mg/dL (ref <5)

## 2016-06-15 LAB — COMPREHENSIVE METABOLIC PANEL
ALK PHOS: 105 U/L (ref 38–126)
ALT: 32 U/L (ref 17–63)
AST: 94 U/L — ABNORMAL HIGH (ref 15–41)
Albumin: 3.6 g/dL (ref 3.5–5.0)
Anion gap: 13 (ref 5–15)
BILIRUBIN TOTAL: 1.7 mg/dL — AB (ref 0.3–1.2)
BUN: 54 mg/dL — AB (ref 6–20)
CALCIUM: 9.5 mg/dL (ref 8.9–10.3)
CO2: 25 mmol/L (ref 22–32)
CREATININE: 1.31 mg/dL — AB (ref 0.61–1.24)
Chloride: 105 mmol/L (ref 101–111)
GFR calc Af Amer: >60 mL/min (ref >60)
GFR, EST NON AFRICAN AMERICAN: 52 mL/min — AB (ref >60)
Glucose, Bld: 123 mg/dL — ABNORMAL HIGH (ref 65–99)
Potassium: 4 mmol/L (ref 3.5–5.1)
Sodium: 143 mmol/L (ref 135–145)
Total Protein: 7.5 g/dL (ref 6.5–8.1)

## 2016-06-15 LAB — CBC WITH DIFFERENTIAL/PLATELET
Basophils Absolute: 0.1 10*3/uL (ref 0–0.1)
Basophils Relative: 1 %
Eosinophils Absolute: 0 10*3/uL (ref 0–0.7)
Eosinophils Relative: 0 %
HEMATOCRIT: 51.9 % (ref 40.0–52.0)
HEMOGLOBIN: 17.4 g/dL (ref 13.0–18.0)
LYMPHS ABS: 1.5 10*3/uL (ref 1.0–3.6)
LYMPHS PCT: 11 %
MCH: 29.2 pg (ref 26.0–34.0)
MCHC: 33.6 g/dL (ref 32.0–36.0)
MCV: 86.8 fL (ref 80.0–100.0)
Monocytes Absolute: 1.9 10*3/uL — ABNORMAL HIGH (ref 0.2–1.0)
Monocytes Relative: 15 %
NEUTROS ABS: 9.7 10*3/uL — AB (ref 1.4–6.5)
Neutrophils Relative %: 73 %
PLATELETS: 260 10*3/uL (ref 150–440)
RBC: 5.98 MIL/uL — AB (ref 4.40–5.90)
RDW: 13.5 % (ref 11.5–14.5)
WBC: 13.2 10*3/uL — ABNORMAL HIGH (ref 3.8–10.6)

## 2016-06-15 LAB — LIPASE, BLOOD: LIPASE: 16 U/L (ref 11–51)

## 2016-06-15 LAB — MAGNESIUM: Magnesium: 2 mg/dL (ref 1.7–2.4)

## 2016-06-15 LAB — CK: Total CK: 2582 U/L — ABNORMAL HIGH (ref 49–397)

## 2016-06-15 LAB — LACTIC ACID, PLASMA: Lactic Acid, Venous: 1.5 mmol/L (ref 0.5–1.9)

## 2016-06-15 MED ORDER — SODIUM CHLORIDE 0.9 % IV BOLUS (SEPSIS)
1000.0000 mL | Freq: Once | INTRAVENOUS | Status: AC
  Administered 2016-06-15: 1000 mL via INTRAVENOUS

## 2016-06-15 NOTE — ED Triage Notes (Addendum)
Pt comes in for AMS by EMS. Daughter called 911 because patient was on the floor in the closet and she broke into his house because she could not get ahold of him and found him on the ground. It is unknown how long patient was down on the ground. Neighbors saw him outside yesterday. Pt was wounds on back and on legs. Pt has hx of diabetes CBG of 111. Pt denies any pain and family says this is not his baseline. Pt was given 573ml of NS and patient seemed more alert to self.
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
## 2016-06-15 NOTE — ED Notes (Signed)
Date and time results received: 06/15/16 2328   Test: troponin Critical Value: 0.48  Name of Provider Notified: Dr. Owens Shark

## 2016-06-16 ENCOUNTER — Inpatient Hospital Stay
Admit: 2016-06-16 | Discharge: 2016-06-16 | Disposition: A | Discharge status: Home / Self Care | Payer: Medicare Other | Attending: Internal Medicine | Admitting: Internal Medicine

## 2016-06-16 ENCOUNTER — Encounter: Payer: Self-pay | Admitting: Internal Medicine

## 2016-06-16 ENCOUNTER — Inpatient Hospital Stay: Payer: Medicare Other

## 2016-06-16 DIAGNOSIS — Z794 Long term (current) use of insulin: Secondary | ICD-10-CM | POA: Diagnosis not present

## 2016-06-16 DIAGNOSIS — G9340 Encephalopathy, unspecified: Secondary | ICD-10-CM | POA: Diagnosis present

## 2016-06-16 DIAGNOSIS — Z823 Family history of stroke: Secondary | ICD-10-CM | POA: Diagnosis not present

## 2016-06-16 DIAGNOSIS — Z7902 Long term (current) use of antithrombotics/antiplatelets: Secondary | ICD-10-CM | POA: Diagnosis not present

## 2016-06-16 DIAGNOSIS — L89152 Pressure ulcer of sacral region, stage 2: Secondary | ICD-10-CM | POA: Diagnosis present

## 2016-06-16 DIAGNOSIS — S8002XA Contusion of left knee, initial encounter: Secondary | ICD-10-CM | POA: Diagnosis present

## 2016-06-16 DIAGNOSIS — S40012A Contusion of left shoulder, initial encounter: Secondary | ICD-10-CM | POA: Diagnosis present

## 2016-06-16 DIAGNOSIS — E860 Dehydration: Secondary | ICD-10-CM | POA: Diagnosis present

## 2016-06-16 DIAGNOSIS — Z833 Family history of diabetes mellitus: Secondary | ICD-10-CM | POA: Diagnosis not present

## 2016-06-16 DIAGNOSIS — I214 Non-ST elevation (NSTEMI) myocardial infarction: Secondary | ICD-10-CM | POA: Diagnosis present

## 2016-06-16 DIAGNOSIS — Z791 Long term (current) use of non-steroidal anti-inflammatories (NSAID): Secondary | ICD-10-CM | POA: Diagnosis not present

## 2016-06-16 DIAGNOSIS — Z79899 Other long term (current) drug therapy: Secondary | ICD-10-CM | POA: Diagnosis not present

## 2016-06-16 DIAGNOSIS — Z7982 Long term (current) use of aspirin: Secondary | ICD-10-CM | POA: Diagnosis not present

## 2016-06-16 DIAGNOSIS — Z955 Presence of coronary angioplasty implant and graft: Secondary | ICD-10-CM | POA: Diagnosis not present

## 2016-06-16 DIAGNOSIS — X30XXXA Exposure to excessive natural heat, initial encounter: Secondary | ICD-10-CM | POA: Diagnosis not present

## 2016-06-16 DIAGNOSIS — L8990 Pressure ulcer of unspecified site, unspecified stage: Secondary | ICD-10-CM | POA: Insufficient documentation

## 2016-06-16 DIAGNOSIS — Y92009 Unspecified place in unspecified non-institutional (private) residence as the place of occurrence of the external cause: Secondary | ICD-10-CM | POA: Diagnosis not present

## 2016-06-16 DIAGNOSIS — T670XXA Heatstroke and sunstroke, initial encounter: Secondary | ICD-10-CM | POA: Diagnosis present

## 2016-06-16 DIAGNOSIS — E1159 Type 2 diabetes mellitus with other circulatory complications: Secondary | ICD-10-CM | POA: Diagnosis present

## 2016-06-16 DIAGNOSIS — I10 Essential (primary) hypertension: Secondary | ICD-10-CM | POA: Diagnosis present

## 2016-06-16 DIAGNOSIS — I2510 Atherosclerotic heart disease of native coronary artery without angina pectoris: Secondary | ICD-10-CM | POA: Diagnosis present

## 2016-06-16 DIAGNOSIS — E785 Hyperlipidemia, unspecified: Secondary | ICD-10-CM | POA: Diagnosis present

## 2016-06-16 DIAGNOSIS — M6282 Rhabdomyolysis: Secondary | ICD-10-CM | POA: Diagnosis present

## 2016-06-16 DIAGNOSIS — I252 Old myocardial infarction: Secondary | ICD-10-CM | POA: Diagnosis not present

## 2016-06-16 DIAGNOSIS — S8001XA Contusion of right knee, initial encounter: Secondary | ICD-10-CM | POA: Diagnosis present

## 2016-06-16 LAB — GLUCOSE, CAPILLARY
GLUCOSE-CAPILLARY: 186 mg/dL — AB (ref 65–99)
GLUCOSE-CAPILLARY: 213 mg/dL — AB (ref 65–99)
GLUCOSE-CAPILLARY: 302 mg/dL — AB (ref 65–99)
Glucose-Capillary: 153 mg/dL — ABNORMAL HIGH (ref 65–99)

## 2016-06-16 LAB — TROPONIN I
TROPONIN I: 0.37 ng/mL — AB (ref <0.03)
Troponin I: 0.28 ng/mL (ref <0.03)
Troponin I: 0.21 ng/mL (ref <0.03)

## 2016-06-16 LAB — CBC
HEMATOCRIT: 41.8 % (ref 40.0–52.0)
HEMOGLOBIN: 14.1 g/dL (ref 13.0–18.0)
MCH: 29.9 pg (ref 26.0–34.0)
MCHC: 33.9 g/dL (ref 32.0–36.0)
MCV: 88.2 fL (ref 80.0–100.0)
Platelets: 218 10*3/uL (ref 150–440)
RBC: 4.74 MIL/uL (ref 4.40–5.90)
RDW: 13.4 % (ref 11.5–14.5)
WBC: 12 10*3/uL — AB (ref 3.8–10.6)

## 2016-06-16 LAB — URINALYSIS, ROUTINE W REFLEX MICROSCOPIC
BACTERIA UA: NONE SEEN
BILIRUBIN URINE: NEGATIVE
GLUCOSE, UA: 150 mg/dL — AB
Ketones, ur: 20 mg/dL — AB
LEUKOCYTES UA: NEGATIVE
NITRITE: NEGATIVE
PH: 5 (ref 5.0–8.0)
Protein, ur: 30 mg/dL — AB
Specific Gravity, Urine: 1.019 (ref 1.005–1.030)

## 2016-06-16 LAB — URINE DRUG SCREEN, QUALITATIVE (ARMC ONLY)
Amphetamines, Ur Screen: NOT DETECTED
BARBITURATES, UR SCREEN: NOT DETECTED
Benzodiazepine, Ur Scrn: NOT DETECTED
CANNABINOID 50 NG, UR ~~LOC~~: NOT DETECTED
COCAINE METABOLITE, UR ~~LOC~~: NOT DETECTED
MDMA (ECSTASY) UR SCREEN: NOT DETECTED
Methadone Scn, Ur: NOT DETECTED
OPIATE, UR SCREEN: NOT DETECTED
Phencyclidine (PCP) Ur S: NOT DETECTED
TRICYCLIC, UR SCREEN: NOT DETECTED

## 2016-06-16 LAB — BASIC METABOLIC PANEL
ANION GAP: 8 (ref 5–15)
BUN: 50 mg/dL — ABNORMAL HIGH (ref 6–20)
CHLORIDE: 109 mmol/L (ref 101–111)
CO2: 25 mmol/L (ref 22–32)
Calcium: 8.4 mg/dL — ABNORMAL LOW (ref 8.9–10.3)
Creatinine, Ser: 1.23 mg/dL (ref 0.61–1.24)
GFR, EST NON AFRICAN AMERICAN: 56 mL/min — AB (ref >60)
Glucose, Bld: 174 mg/dL — ABNORMAL HIGH (ref 65–99)
POTASSIUM: 3.7 mmol/L (ref 3.5–5.1)
SODIUM: 142 mmol/L (ref 135–145)

## 2016-06-16 LAB — ECHOCARDIOGRAM COMPLETE
HEIGHTINCHES: 70 in
WEIGHTICAEL: 2376 [oz_av]

## 2016-06-16 LAB — CK: Total CK: 2533 U/L — ABNORMAL HIGH (ref 49–397)

## 2016-06-16 MED ORDER — METOCLOPRAMIDE HCL 5 MG PO TABS
5.0000 mg | ORAL_TABLET | Freq: Two times a day (BID) | ORAL | Status: DC
  Administered 2016-06-16 – 2016-06-18 (×4): 5 mg via ORAL
  Filled 2016-06-16 (×4): qty 1

## 2016-06-16 MED ORDER — ONDANSETRON HCL 4 MG/2ML IJ SOLN
4.0000 mg | Freq: Once | INTRAMUSCULAR | Status: AC
  Administered 2016-06-16: 4 mg via INTRAVENOUS

## 2016-06-16 MED ORDER — ONDANSETRON HCL 4 MG PO TABS: 4.0000 mg | ORAL_TABLET | Freq: Four times a day (QID) | ORAL | Status: DC | PRN

## 2016-06-16 MED ORDER — CEFTRIAXONE SODIUM 2 G IJ SOLR
2.0000 g | INTRAMUSCULAR | Status: DC
  Administered 2016-06-17 – 2016-06-18 (×2): 2 g via INTRAVENOUS
  Filled 2016-06-16 (×2): qty 2

## 2016-06-16 MED ORDER — INSULIN ASPART 100 UNIT/ML ~~LOC~~ SOLN
0.0000 [IU] | Freq: Every day | SUBCUTANEOUS | Status: DC
  Administered 2016-06-16: 4 [IU] via SUBCUTANEOUS
  Administered 2016-06-17: 2 [IU] via SUBCUTANEOUS
  Filled 2016-06-16 (×2): qty 1

## 2016-06-16 MED ORDER — CLOPIDOGREL BISULFATE 75 MG PO TABS
75.0000 mg | ORAL_TABLET | Freq: Every day | ORAL | Status: DC
Start: 2016-06-16 — End: 2016-06-18
  Administered 2016-06-16 – 2016-06-18 (×3): 75 mg via ORAL
  Filled 2016-06-16 (×3): qty 1

## 2016-06-16 MED ORDER — PANTOPRAZOLE SODIUM 40 MG PO TBEC
40.0000 mg | DELAYED_RELEASE_TABLET | Freq: Two times a day (BID) | ORAL | Status: DC
  Administered 2016-06-16 – 2016-06-18 (×4): 40 mg via ORAL
  Filled 2016-06-16 (×4): qty 1

## 2016-06-16 MED ORDER — SODIUM CHLORIDE 0.9 % IV BOLUS (SEPSIS)
1000.0000 mL | Freq: Once | INTRAVENOUS | Status: AC
  Administered 2016-06-16: 1000 mL via INTRAVENOUS

## 2016-06-16 MED ORDER — INSULIN ASPART 100 UNIT/ML ~~LOC~~ SOLN
0.0000 [IU] | Freq: Three times a day (TID) | SUBCUTANEOUS | Status: DC
  Administered 2016-06-16: 2 [IU] via SUBCUTANEOUS
  Administered 2016-06-16: 3 [IU] via SUBCUTANEOUS
  Administered 2016-06-16: 2 [IU] via SUBCUTANEOUS
  Administered 2016-06-17 (×2): 5 [IU] via SUBCUTANEOUS
  Administered 2016-06-17 – 2016-06-18 (×2): 3 [IU] via SUBCUTANEOUS
  Administered 2016-06-18: 2 [IU] via SUBCUTANEOUS
  Filled 2016-06-16 (×9): qty 1

## 2016-06-16 MED ORDER — CARVEDILOL 3.125 MG PO TABS
3.1250 mg | ORAL_TABLET | Freq: Two times a day (BID) | ORAL | Status: DC
  Administered 2016-06-16 – 2016-06-18 (×5): 3.125 mg via ORAL
  Filled 2016-06-16 (×5): qty 1

## 2016-06-16 MED ORDER — ENOXAPARIN SODIUM 40 MG/0.4ML ~~LOC~~ SOLN
40.0000 mg | SUBCUTANEOUS | Status: DC
  Administered 2016-06-16 – 2016-06-17 (×2): 40 mg via SUBCUTANEOUS
  Filled 2016-06-16: qty 0.4

## 2016-06-16 MED ORDER — ONDANSETRON HCL 4 MG/2ML IJ SOLN: 4.0000 mg | Freq: Four times a day (QID) | INTRAMUSCULAR | Status: DC | PRN

## 2016-06-16 MED ORDER — LISINOPRIL 20 MG PO TABS
20.0000 mg | ORAL_TABLET | Freq: Every day | ORAL | Status: DC
Start: 2016-06-16 — End: 2016-06-18
  Administered 2016-06-16 – 2016-06-18 (×3): 20 mg via ORAL
  Filled 2016-06-16 (×3): qty 1

## 2016-06-16 MED ORDER — ATORVASTATIN CALCIUM 20 MG PO TABS
80.0000 mg | ORAL_TABLET | Freq: Every day | ORAL | Status: DC
  Administered 2016-06-16 – 2016-06-18 (×3): 80 mg via ORAL
  Filled 2016-06-16 (×3): qty 4

## 2016-06-16 MED ORDER — ASPIRIN 81 MG PO CHEW
81.0000 mg | CHEWABLE_TABLET | Freq: Every day | ORAL | Status: DC
  Administered 2016-06-16 – 2016-06-18 (×3): 81 mg via ORAL
  Filled 2016-06-16 (×3): qty 1

## 2016-06-16 MED ORDER — DEXTROSE 5 % IV SOLN
2.0000 g | Freq: Once | INTRAVENOUS | Status: AC
  Administered 2016-06-16: 2 g via INTRAVENOUS
  Filled 2016-06-16: qty 2

## 2016-06-16 MED ORDER — ACETAMINOPHEN 325 MG PO TABS
650.0000 mg | ORAL_TABLET | Freq: Four times a day (QID) | ORAL | Status: DC | PRN
Start: 2016-06-16 — End: 2016-06-18
  Administered 2016-06-16 – 2016-06-17 (×2): 650 mg via ORAL
  Filled 2016-06-16 (×2): qty 2

## 2016-06-16 MED ORDER — STERILE WATER FOR INJECTION IV SOLN
INTRAVENOUS | Status: DC
  Administered 2016-06-16 – 2016-06-18 (×5): via INTRAVENOUS
  Filled 2016-06-16 (×9): qty 9.71

## 2016-06-16 MED ORDER — ACETAMINOPHEN 650 MG RE SUPP: 650.0000 mg | Freq: Four times a day (QID) | RECTAL | Status: DC | PRN

## 2016-06-16 MED ORDER — SODIUM CHLORIDE 0.9 % IV SOLN
INTRAVENOUS | Status: DC
  Administered 2016-06-16: 04:00:00 via INTRAVENOUS
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
## 2016-06-16 NOTE — Plan of Care (Signed)
Problem: Physical Regulation: Goal: Ability to maintain clinical measurements within normal limits will improve Pt alert to self and place.Follow simple commands.Drowsy, lethargic, sleepy. Tolerated clear liquid diet.

## 2016-06-16 NOTE — Progress Notes (Signed)
Pharmacy Antibiotic Note  [name] is a 76 y.o. male admitted on 06/15/2016 with possible UTI.  Pharmacy has been consulted for ceftriaxone dosing dosing.  Plan: Ceftriaxone 2 grams q 24 hours ordered.  Height: 5\' 10"  (177.8 cm) Weight: 148 lb 8 oz (67.4 kg) IBW/kg (Calculated) : 73  Temp (24hrs), Avg:98.2 F (36.8 C), Min:97.7 F (36.5 C), Max:98.7 F (37.1 C)   Recent Labs Lab 06/15/16 2244  WBC 13.2*  CREATININE 1.31*  LATICACIDVEN 1.5    Estimated Creatinine Clearance: 47.2 mL/min (A) (by C-G formula based on SCr of 1.31 mg/dL (H)).    No Known Allergies  Antimicrobials this admission: ceftriaxone 6/14 >>    >>   Dose adjustments this admission:  Microbiology results: No micro     6/13 UA: pending Thank you for allowing pharmacy to be a part of this patient's care.  Rockne Dearinger S 06/16/2016 4:03 AM

## 2016-06-16 NOTE — Progress Notes (Signed)
Pt arrived via stretcher alone from ED. Pt alert to self and where he is at, and the year. Telemetry monitor applied and called to CCMD. Skin assessed. Bed alarm in place.

## 2016-06-16 NOTE — H&P (Signed)
[facility] [place] NAME: [name]    MR#:  [id number]  DATE OF BIRTH:  [birth year]  DATE OF ADMISSION:  06/15/2016  PRIMARY CARE PHYSICIAN: Valera Castle, MD   REQUESTING/REFERRING PHYSICIAN: Owens Shark, MD  CHIEF COMPLAINT:   Chief Complaint  Patient presents with  . Altered Mental Status    HISTORY OF PRESENT ILLNESS:  [name]  is a 76 y.o. male who presents with Confusion and weakness. Patient was found down in his home by his family members. Last time he was known to be well was several days ago when he was seen well by his neighbor. He has a number of abrasions on his body. Patient states that he's been feeling bad for the past few days, and was worried he might of had a stroke. He is unable to give significant details as to why he felt this. He does state that at some point had difficulty controlling on his arms, but does not clearly show the same symptoms tonight. He was found to have rhabdomyolysis here in the ED. Hospitalists were called for admission and further evaluation and treatment  PAST MEDICAL HISTORY:   Past Medical History:  Diagnosis Date  . BPH (benign prostatic hyperplasia)   . CAD (coronary artery disease)    a. 10/2014 Ant STEMI/PCI: LM nl, LAD 121m (2.75x38 Xience DES) - R->L collats, D1 60, D2 90ost, LCX 20ost, OM1/2/3 min irregs, RCA 36m (2.75x15 Xience DES), 50d, RPDA small;  b. 10/2014 Echo: EF 55-60%, mod ant/ap HK, Gr1 DD.  . Diabetes mellitus without complication ([place])   . Essential hypertension   . Hyperlipidemia   . Osteoarthritis     PAST SURGICAL HISTORY:   Past Surgical History:  Procedure Laterality Date  . CARDIAC CATHETERIZATION N/A 10/20/2014   Procedure: Left Heart Cath and Coronary Angiography;  Surgeon: Wellington Hampshire, MD;  Location: [facility];  Service: Cardiovascular;  Laterality: N/A;  . CARDIAC CATHETERIZATION Right 10/20/2014   Procedure: Coronary  Stent Intervention;  Surgeon: Wellington Hampshire, MD;  Location: [facility];  Service: Cardiovascular;  Laterality: Right;  LAD/RCA Stent placement    SOCIAL HISTORY:   Social History  Substance Use Topics  . Smoking status: Never Smoker  . Smokeless tobacco: Never Used  . Alcohol use No    FAMILY HISTORY:   Family History  Problem Relation Age of Onset  . Stroke Mother   . Diabetes Sister   . Diabetes Brother     DRUG ALLERGIES:  No Known Allergies  MEDICATIONS AT HOME:   Prior to Admission medications   Medication Sig Start Date End Date Taking? Authorizing Provider  aspirin 81 MG tablet Take 81 mg by mouth daily.    [provider]  atorvastatin (LIPITOR) 80 MG tablet Take 80 mg by mouth daily.    [provider]  carvedilol (COREG) 3.125 MG tablet Take 1 tablet (3.125 mg total) by mouth 2 (two) times daily with a meal. 11/25/14   Rogelia Mire, NP  clopidogrel (PLAVIX) 75 MG tablet Take 1 tablet (75 mg total) by mouth daily. 05/24/16   Wellington Hampshire, MD  docusate sodium (COLACE) 100 MG capsule Take 1 capsule (100 mg total) by mouth 2 (two) times daily. 07/31/15   Theodoro Grist, MD  fluticasone (FLONASE) 50 MCG/ACT nasal spray Place 2 sprays into both nostrils daily.    [provider]  glucagon (GLUCAGON EMERGENCY) 1  MG injection Inject 1 mg into the vein once as needed.    [provider]  glucose (B-D GLUCOSE) 5 g chewable tablet Chew 3 tablets (15 g total) by mouth as needed for low blood sugar. 07/31/15 07/30/16  Theodoro Grist, MD  insulin aspart (NOVOLOG) 100 UNIT/ML injection Inject 16 units with meals three times daily. MDD 100 units. 11/19/15   [provider]  insulin glargine (LANTUS) 100 UNIT/ML injection Inject 38 Units into the skin.  01/18/16 01/18/17  [provider]  LANTUS 100 UNIT/ML injection Inject 0.25 mLs (25 Units total) into the skin every evening. 07/31/15   Theodoro Grist, MD   lisinopril (PRINIVIL,ZESTRIL) 20 MG tablet Take 20 mg by mouth daily. 05/12/16 05/12/17  [provider]  magnesium oxide (MAG-OX) 400 MG tablet Take 400 mg by mouth daily. 09/23/15 09/22/16  [provider]  meloxicam (MOBIC) 7.5 MG tablet Take 7.5 mg by mouth daily. 07/23/15 07/22/16  [provider]  metFORMIN (GLUCOPHAGE) 500 MG tablet Take 1,000 mg by mouth 2 (two) times daily. 05/12/16 04/18/17  [provider]  metoCLOPramide (REGLAN) 5 MG tablet Take 5 mg by mouth 2 (two) times daily. For 10 days    [provider]  nitroGLYCERIN (NITROSTAT) 0.4 MG SL tablet Place 1 tablet (0.4 mg total) under the tongue every 5 (five) minutes as needed for chest pain. 11/25/14   Rogelia Mire, NP  ondansetron (ZOFRAN ODT) 4 MG disintegrating tablet Take 1 tablet (4 mg total) by mouth every 8 (eight) hours as needed for nausea or vomiting. 07/20/15   Joanne Gavel, MD  pantoprazole (PROTONIX) 40 MG tablet Take 1 tablet (40 mg total) by mouth 2 (two) times daily. 07/31/15   Theodoro Grist, MD  polyethylene glycol (MIRALAX / GLYCOLAX) packet Take 17 g by mouth daily as needed for mild constipation or moderate constipation. 07/31/15   Theodoro Grist, MD  senna-docusate (SENOKOT-S) 8.6-50 MG tablet Take 1 tablet by mouth at bedtime as needed for mild constipation. 07/31/15   Theodoro Grist, MD  sildenafil (REVATIO) 20 MG tablet Take 20 mg by mouth. 05/12/16   [provider]    REVIEW OF SYSTEMS:  Review of Systems  Constitutional: Positive for malaise/fatigue. Negative for chills, fever and weight loss.  HENT: Negative for ear pain, hearing loss and tinnitus.   Eyes: Negative for blurred vision, double vision, pain and redness.  Respiratory: Negative for cough, hemoptysis and shortness of breath.   Cardiovascular: Negative for chest pain, palpitations, orthopnea and leg swelling.  Gastrointestinal: Negative for abdominal pain, constipation, diarrhea, nausea  and vomiting.  Genitourinary: Negative for dysuria, frequency and hematuria.  Musculoskeletal: Negative for back pain, joint pain and neck pain.  Skin:       No acne, rash, or lesions  Neurological: Positive for weakness. Negative for dizziness, tremors and focal weakness.       Confusion  Endo/Heme/Allergies: Negative for polydipsia. Does not bruise/bleed easily.  Psychiatric/Behavioral: Negative for depression. The patient is not nervous/anxious and does not have insomnia.      VITAL SIGNS:   Vitals:   06/15/16 2228 06/15/16 2229 06/15/16 2331 06/16/16 0000  BP: (!) 145/82  (!) 152/80 134/67  Pulse: 99     Resp: 18  17 (!) 21  Temp: 97.7 F (36.5 C)     TempSrc: Oral     SpO2: 96%     Weight:  79.4 kg (175 lb)    Height:  5\' 10"  (1.778 m)  Wt Readings from Last 3 Encounters:  06/15/16 79.4 kg (175 lb)  05/24/16 69.7 kg (153 lb 12 oz)  07/28/15 73 kg (160 lb 14.4 oz)    PHYSICAL EXAMINATION:  Physical Exam  Vitals reviewed. Constitutional: He appears well-developed and well-nourished. No distress.  HENT:  Head: Normocephalic and atraumatic.  Dry mucous membranes  Eyes: Conjunctivae and EOM are normal. Pupils are equal, round, and reactive to light. No scleral icterus.  Neck: Normal range of motion. Neck supple. No JVD present. No thyromegaly present.  Cardiovascular: Normal rate, regular rhythm and intact distal pulses.  Exam reveals no gallop and no friction rub.   No murmur heard. Respiratory: Effort normal and breath sounds normal. No respiratory distress. He has no wheezes. He has no rales.  GI: Soft. Bowel sounds are normal. He exhibits no distension. There is no tenderness.  Musculoskeletal: Normal range of motion. He exhibits no edema.  No arthritis, no gout  Lymphadenopathy:    He has no cervical adenopathy.  Neurological: He is alert. No cranial nerve deficit.  No dysarthria, no aphasia, unable to fully assess, though the patient does seem to have intact  sensation, and does seem to be able to move all of his extremities spontaneously.  Skin: Skin is warm and dry. No rash noted. No erythema.  Psychiatric:  Unable to fully assess due to patient condition    LABORATORY PANEL:   CBC  Recent Labs Lab 06/15/16 2244  WBC 13.2*  HGB 17.4  HCT 51.9  PLT 260   ------------------------------------------------------------------------------------------------------------------  Chemistries   Recent Labs Lab 06/15/16 2244  NA 143  K 4.0  CL 105  CO2 25  GLUCOSE 123*  BUN 54*  CREATININE 1.31*  CALCIUM 9.5  MG 2.0  AST 94*  ALT 32  ALKPHOS 105  BILITOT 1.7*   ------------------------------------------------------------------------------------------------------------------  Cardiac Enzymes  Recent Labs Lab 06/15/16 2244  TROPONINI 0.48*   ------------------------------------------------------------------------------------------------------------------  RADIOLOGY:  Ct Head Wo Contrast  Result Date: 06/15/2016 CLINICAL DATA:  Found down on ground. Concern for head injury. Initial encounter. EXAM: CT HEAD WITHOUT CONTRAST TECHNIQUE: Contiguous axial images were obtained from the base of the skull through the vertex without intravenous contrast. COMPARISON:  MRI of the brain performed 12/13/2012 FINDINGS: Brain: No evidence of acute infarction, hemorrhage, hydrocephalus, extra-axial collection or mass lesion/mass effect. Prominence of the ventricles and sulci reflects mild cortical volume loss. Mild cerebellar atrophy is noted. Scattered periventricular white matter change likely reflects small vessel ischemic microangiopathy. The brainstem and fourth ventricle are within normal limits. The basal ganglia are unremarkable in appearance. The cerebral hemispheres demonstrate grossly normal gray-white differentiation. No mass effect or midline shift is seen. Vascular: No hyperdense vessel or unexpected calcification. Skull: There is no  evidence of fracture; visualized osseous structures are unremarkable in appearance. Sinuses/Orbits: The orbits are within normal limits. Mild mucosal thickening is noted at the right maxillary sinus. There is mild partial opacification of the left mastoid air cells. The remaining paranasal sinuses and right mastoid air cells are well-aerated. Other: No significant soft tissue abnormalities are seen. IMPRESSION: 1. No evidence of traumatic intracranial injury or fracture. 2. Mild cortical volume loss and scattered small vessel ischemic microangiopathy. 3. Mild mucosal thickening at the right maxillary sinus, and mild partial opacification of the left mastoid air cells. Electronically Signed   By: Garald Balding M.D.   On: 06/15/2016 23:19    EKG:   Orders placed or performed during the hospital encounter of 06/15/16  .  EKG 12-Lead  . EKG 12-Lead    IMPRESSION AND PLAN:  Principal Problem:   Acute encephalopathy - unclear etiology at this time. Differential includes stroke versus UTI versus possible coronary event. Urine studies are pending, MRI ordered. Cardiac workup as below Active Problems:   Dehydration - IV fluids   Rhabdomyolysis - IV fluids as above, monitor creatinine   Type 2 diabetes mellitus with circulatory disorder (HCC) - sliding scale insulin with corresponding glucose checks   CAD (coronary artery disease) - elevated troponin, unclear if this is potentially downtrending troponin after coronary event versus elevated troponin due to his rhabdomyolysis. We will trend his troponin as well as his CK.   Essential hypertension - continue home meds  All the records are reviewed and case discussed with ED provider. Management plans discussed with the patient and/or family.  DVT PROPHYLAXIS: SubQ lovenox  GI PROPHYLAXIS: PPI  ADMISSION STATUS: Inpatient  CODE STATUS: Full Code Status History    Date Active Date Inactive Code Status Order ID Comments User Context   07/28/2015 10:38  PM 07/31/2015  3:36 PM Full Code [id number]  Harvie Bridge, DO Inpatient   10/20/2014 10:38 AM 10/22/2014  2:38 PM Full Code [id number]  Wellington Hampshire, MD Inpatient      TOTAL TIME TAKING CARE OF THIS PATIENT: 45 minutes.   Lisett Dirusso [place] 06/16/2016, 1:11 AM  [facility]  Office  [phone]  CC: Primary care physician; Valera Castle, MD  Note:  This document was prepared using Dragon voice recognition software and may include unintentional dictation errors.

## 2016-06-16 NOTE — Progress Notes (Signed)
*  PRELIMINARY RESULTS* Echocardiogram 2D Echocardiogram has been performed.  Sherrie Sport 06/16/2016, 10:22 AM

## 2016-06-17 LAB — BASIC METABOLIC PANEL
ANION GAP: 4 — AB (ref 5–15)
BUN: 31 mg/dL — ABNORMAL HIGH (ref 6–20)
CALCIUM: 7.9 mg/dL — AB (ref 8.9–10.3)
CO2: 31 mmol/L (ref 22–32)
CREATININE: 1.02 mg/dL (ref 0.61–1.24)
Chloride: 104 mmol/L (ref 101–111)
GFR calc Af Amer: >60 mL/min (ref >60)
GFR calc non Af Amer: >60 mL/min (ref >60)
GLUCOSE: 239 mg/dL — AB (ref 65–99)
Potassium: 3.3 mmol/L — ABNORMAL LOW (ref 3.5–5.1)
Sodium: 139 mmol/L (ref 135–145)

## 2016-06-17 LAB — CK: Total CK: 977 U/L — ABNORMAL HIGH (ref 49–397)

## 2016-06-17 LAB — GLUCOSE, CAPILLARY
GLUCOSE-CAPILLARY: 276 mg/dL — AB (ref 65–99)
Glucose-Capillary: 220 mg/dL — ABNORMAL HIGH (ref 65–99)
Glucose-Capillary: 265 mg/dL — ABNORMAL HIGH (ref 65–99)
Glucose-Capillary: 210 mg/dL — ABNORMAL HIGH (ref 65–99)

## 2016-06-17 LAB — CBC
HEMATOCRIT: 36.5 % — AB (ref 40.0–52.0)
Hemoglobin: 12.5 g/dL — ABNORMAL LOW (ref 13.0–18.0)
MCH: 29.4 pg (ref 26.0–34.0)
MCHC: 34.3 g/dL (ref 32.0–36.0)
MCV: 85.7 fL (ref 80.0–100.0)
Platelets: 196 10*3/uL (ref 150–440)
RBC: 4.25 MIL/uL — ABNORMAL LOW (ref 4.40–5.90)
RDW: 13.4 % (ref 11.5–14.5)
WBC: 10.3 10*3/uL (ref 3.8–10.6)

## 2016-06-17 MED ORDER — POTASSIUM CHLORIDE CRYS ER 20 MEQ PO TBCR
40.0000 meq | EXTENDED_RELEASE_TABLET | Freq: Once | ORAL | Status: AC
  Administered 2016-06-17: 40 meq via ORAL
  Filled 2016-06-17: qty 2

## 2016-06-17 MED ORDER — TAMSULOSIN HCL 0.4 MG PO CAPS
0.4000 mg | ORAL_CAPSULE | Freq: Every day | ORAL | Status: DC
  Administered 2016-06-17: 0.4 mg via ORAL
  Filled 2016-06-17: qty 1

## 2016-06-17 MED ORDER — INSULIN GLARGINE 100 UNIT/ML ~~LOC~~ SOLN
10.0000 [IU] | Freq: Every day | SUBCUTANEOUS | Status: DC
  Administered 2016-06-17: 10 [IU] via SUBCUTANEOUS
  Filled 2016-06-17 (×2): qty 0.1

## 2016-06-17 NOTE — Progress Notes (Signed)
Pharmacy Antibiotic Note  [name] is a 76 y.o. male admitted on 06/15/2016 with possible UTI.  Pharmacy has been consulted for ceftriaxone dosing dosing.  Plan: Ceftriaxone 2 grams q 24 hours ordered.  Height: 5\' 10"  (177.8 cm) Weight: 148 lb 8 oz (67.4 kg) IBW/kg (Calculated) : 73  Temp (24hrs), Avg:98.2 F (36.8 C), Min:97.9 F (36.6 C), Max:98.8 F (37.1 C)   Recent Labs Lab 06/15/16 2244 06/16/16 0455 06/17/16 0841  WBC 13.2* 12.0* 10.3  CREATININE 1.31* 1.23 1.02  LATICACIDVEN 1.5  --   --     Estimated Creatinine Clearance: 60.6 mL/min (by C-G formula based on SCr of 1.02 mg/dL).    No Known Allergies  Antimicrobials this admission: ceftriaxone 6/14 >>    Dose adjustments this admission:  Microbiology results: No micro   Thank you for allowing pharmacy to be a part of this patient's care.  [name] 06/17/2016 11:36 AM

## 2016-06-17 NOTE — Care Management (Signed)
Patient is confused at present and reported this is not his baseline.  Was found down in his home.  Rhabdomyolysis. Have requested PT and OT consults.  Left voicemail message for patient's daughter Otho Perl

## 2016-06-17 NOTE — Progress Notes (Signed)
Inpatient Diabetes Program Recommendations  AACE/ADA: New Consensus Statement on Inpatient Glycemic Control (2015)  Target Ranges:  Prepandial:   less than 140 mg/dL      Peak postprandial:   less than 180 mg/dL (1-2 hours)      Critically ill patients:  140 - 180 mg/dL   Lab Results  Component Value Date   GLUCAP 220 (H) 06/17/2016   HGBA1C 8.0 (H) 07/29/2015    Review of Glycemic Control  Results for [name], [name] (MRN [id number]) as of 06/17/2016 10:27  Ref. Range 06/16/2016 07:37 06/16/2016 12:12 06/16/2016 16:04 06/16/2016 20:57 06/17/2016 07:30  Glucose-Capillary Latest Ref Range: 65 - 99 mg/dL 186 (H) 153 (H) 213 (H) 302 (H) 220 (H)    Diabetes history:  Type 2 Outpatient Diabetes medications: Trulicity 0.75mg /week, Metformin 1000mg  bid, Lantus 38 units qday, Novolog 16 units tid  Current orders for Inpatient glycemic control: Novolog 0-9 units tid, Novolog 0-5 units qhs  Inpatient Diabetes Program Recommendations:  Consider starting Lantus 15 units qhs.  If he is to remain NPO, please change Novolog correction to q4h and stop Novolog 0-5 untis qhs.   Gentry Fitz, RN, BA, MHA, CDE Diabetes Coordinator Inpatient Diabetes Program  [phone] (Team Pager) [phone] ([place]) 06/17/2016 10:31 AM

## 2016-06-17 NOTE — Progress Notes (Signed)
[facility] [place] NAME: [name]    MR#:  [id number]  DATE OF BIRTH:  [birth year]  SUBJECTIVE:  CHIEF COMPLAINT:  Patient is more awake and alert. Tolerating diet. Family at bedside.  REVIEW OF SYSTEMS:   Review of Systems  Constitutional: Positive for malaise/fatigue. Negative for chills, fever and weight loss.  HENT: Negative for ear pain, hearing loss and tinnitus.   Eyes: Negative for blurred vision, double vision, pain and redness.  Respiratory: Negative for cough, hemoptysis and shortness of breath.   Cardiovascular: Negative for chest pain, palpitations, orthopnea and leg swelling.  Gastrointestinal: Negative for abdominal pain, constipation, diarrhea, nausea and vomiting.  Genitourinary: Negative for dysuria, frequency and hematuria.  Musculoskeletal: Negative for back pain, joint pain and neck pain.  Skin:  No acne, rash, or lesions  Neurological: Positive for weakness. Negative for dizziness, tremors and focal weakness.    Endo/Heme/Allergies: Negative for polydipsia. Does not bruise/bleed easily.  Psychiatric/Behavioral: Negative for depression. The patient is not nervous/anxious and does not have insomnia.     DRUG ALLERGIES:  No Known Allergies  VITALS:  Blood pressure (!) 123/53, pulse (!) 56, temperature 97.7 F (36.5 C), temperature source Oral, resp. rate 18, height 5\' 10"  (1.778 m), weight 67.4 kg (148 lb 8 oz), SpO2 99 %.  PHYSICAL EXAMINATION:  GENERAL:  76 y.o.-year-old patient lying in the bed with no acute distress.  EYES: Pupils equal, round, reactive to light and accommodation. No scleral icterus. HEENT: Head atraumatic, normocephalic. Oropharynx and nasopharynx clear. Dry mucous membranes NECK:  Supple, no jugular venous distention. No thyroid enlargement, no tenderness.  LUNGS: Normal breath sounds bilaterally, no wheezing, rales,rhonchi or crepitation. No use of accessory muscles of  respiration.  CARDIOVASCULAR: S1, S2 normal. No murmurs, rubs, or gallops.  ABDOMEN: Soft, nontender, nondistended. Bowel sounds present. No organomegaly or mass.  EXTREMITIES: No pedal edema, cyanosis, or clubbing.  NEUROLOGIC: Patient is lethargic  PSYCHIATRIC: The patient is altered SKIN: No obvious rash, lesion, or ulcer.    LABORATORY PANEL:   CBC  Recent Labs Lab 06/17/16 0841  WBC 10.3  HGB 12.5*  HCT 36.5*  PLT 196   ------------------------------------------------------------------------------------------------------------------  Chemistries   Recent Labs Lab 06/15/16 2244  06/17/16 0841  NA 143  < > 139  K 4.0  < > 3.3*  CL 105  < > 104  CO2 25  < > 31  GLUCOSE 123*  < > 239*  BUN 54*  < > 31*  CREATININE 1.31*  < > 1.02  CALCIUM 9.5  < > 7.9*  MG 2.0  --   --   AST 94*  --   --   ALT 32  --   --   ALKPHOS 105  --   --   BILITOT 1.7*  --   --   < > = values in this interval not displayed. ------------------------------------------------------------------------------------------------------------------  Cardiac Enzymes  Recent Labs Lab 06/16/16 1644  TROPONINI 0.21*   ------------------------------------------------------------------------------------------------------------------  RADIOLOGY:  Ct Head Wo Contrast  Result Date: 06/15/2016 CLINICAL DATA:  Found down on ground. Concern for head injury. Initial encounter. EXAM: CT HEAD WITHOUT CONTRAST TECHNIQUE: Contiguous axial images were obtained from the base of the skull through the vertex without intravenous contrast. COMPARISON:  MRI of the brain performed 12/13/2012 FINDINGS: Brain: No evidence of acute infarction, hemorrhage, hydrocephalus, extra-axial collection or mass lesion/mass effect. Prominence of the ventricles and sulci reflects mild cortical volume loss.  Mild cerebellar atrophy is noted. Scattered periventricular white matter change likely reflects small vessel ischemic  microangiopathy. The brainstem and fourth ventricle are within normal limits. The basal ganglia are unremarkable in appearance. The cerebral hemispheres demonstrate grossly normal gray-white differentiation. No mass effect or midline shift is seen. Vascular: No hyperdense vessel or unexpected calcification. Skull: There is no evidence of fracture; visualized osseous structures are unremarkable in appearance. Sinuses/Orbits: The orbits are within normal limits. Mild mucosal thickening is noted at the right maxillary sinus. There is mild partial opacification of the left mastoid air cells. The remaining paranasal sinuses and right mastoid air cells are well-aerated. Other: No significant soft tissue abnormalities are seen. IMPRESSION: 1. No evidence of traumatic intracranial injury or fracture. 2. Mild cortical volume loss and scattered small vessel ischemic microangiopathy. 3. Mild mucosal thickening at the right maxillary sinus, and mild partial opacification of the left mastoid air cells. Electronically Signed   By: Garald Balding M.D.   On: 06/15/2016 23:19   Mr Brain Wo Contrast  Result Date: 06/16/2016 CLINICAL DATA:  76 year old male found down by family. Confusion weakness. EXAM: MRI HEAD WITHOUT CONTRAST TECHNIQUE: Multiplanar, multiecho pulse sequences of the brain and surrounding structures were obtained without intravenous contrast. COMPARISON:  Head CT without contrast 06/15/2016. Cervical spine MRI 06/23/2014. Brain MRI 12/13/2012. FINDINGS: Brain: Cerebral volume is stable since 2014. No restricted diffusion to suggest acute infarction. No midline shift, mass effect, evidence of mass lesion, ventriculomegaly, extra-axial collection or acute intracranial hemorrhage. Cervicomedullary junction and pituitary are within normal limits. Focus of metal susceptibility artifact along the left vertex of unclear etiology. Pearline Cables and white matter signal throughout the brain appears stable since 2014 and within  normal limits for age. No cortical encephalomalacia or chronic cerebral blood products identified. Vascular: Major intracranial vascular flow voids are preserved. Skull and upper cervical spine: Stable visualized upper cervical spine since 2016. Normal bone marrow signal. Sinuses/Orbits: Stable and negative orbits soft tissues. Mild paranasal sinus mucosal thickening is new since 2014. Other: Mild left mastoid effusion has not significantly changed. Right mastoids remain clear. Other Visible internal auditory structures appear normal. Negative scalp soft tissues. IMPRESSION: 1. No acute intracranial abnormality. Stable since 2014 and normal for age noncontrast MRI appearance of the brain. 2. Mild paranasal sinus inflammation. Electronically Signed   By: Genevie Ann M.D.   On: 06/16/2016 11:58    EKG:   Orders placed or performed during the hospital encounter of 06/15/16  . EKG 12-Lead  . EKG 12-Lead    ASSESSMENT AND PLAN:   # Acute encephalopathy -UTI versus dehydration Clinically improving more awake and alert today . Urine studies are pending MRI negative Troponins are downtrending-0.48-0.37-0.28 Lactic acid in the normal range   #  Dehydration - IV fluids    # Rhabdomyolysis - IV fluids with sodium bicarbonate monitor creatinine. Monitor CK levels CK 970 today    # Type 2 diabetes mellitus with circulatory disorder (HCC) - sliding scale insulin with corresponding glucose checks.patient is started on Lantus. Check hemoglobin A1c      #CAD (coronary artery disease) - elevated troponin, unclear if this is potentially downtrending troponin after coronary event versus elevated troponin due to his rhabdomyolysis. We will trend his troponin as well as his CK .   #Essential hypertension - continue home meds  -disposition PT evaluation   All the records are reviewed and case discussed with Care Management/Social Workerr. Management plans discussed with the patient, family and they are in  agreement.  CODE STATUS: FC   TOTAL TIME TAKING CARE OF THIS PATIENT: 36 minutes.   POSSIBLE D/C IN 2-3 DAYS, DEPENDING ON CLINICAL CONDITION.  Note: This dictation was prepared with Dragon dictation along with smaller phrase technology. Any transcriptional errors that result from this process are unintentional.   Nicholes Mango M.D on 06/17/2016 at 3:41 PM  Between 7am to 6pm - Pager - [phone] After 6pm go to [email or web address] - password [email or web address] [facility]  Office  [phone]  CC: Primary care physician; Valera Castle, MD

## 2016-06-18 LAB — GLUCOSE, CAPILLARY
GLUCOSE-CAPILLARY: 186 mg/dL — AB (ref 65–99)
Glucose-Capillary: 234 mg/dL — ABNORMAL HIGH (ref 65–99)

## 2016-06-18 LAB — CK: Total CK: 589 U/L — ABNORMAL HIGH (ref 49–397)

## 2016-06-18 LAB — BASIC METABOLIC PANEL
Anion gap: 6 (ref 5–15)
BUN: 24 mg/dL — AB (ref 6–20)
CHLORIDE: 100 mmol/L — AB (ref 101–111)
CO2: 33 mmol/L — AB (ref 22–32)
CREATININE: 0.9 mg/dL (ref 0.61–1.24)
Calcium: 7.7 mg/dL — ABNORMAL LOW (ref 8.9–10.3)
GFR calc Af Amer: >60 mL/min (ref >60)
GFR calc non Af Amer: >60 mL/min (ref >60)
Glucose, Bld: 185 mg/dL — ABNORMAL HIGH (ref 65–99)
Potassium: 3 mmol/L — ABNORMAL LOW (ref 3.5–5.1)
Sodium: 139 mmol/L (ref 135–145)

## 2016-06-18 MED ORDER — POTASSIUM CHLORIDE CRYS ER 20 MEQ PO TBCR
40.0000 meq | EXTENDED_RELEASE_TABLET | Freq: Once | ORAL | Status: AC
  Administered 2016-06-18: 40 meq via ORAL
  Filled 2016-06-18: qty 2

## 2016-06-18 NOTE — Evaluation (Signed)
Occupational Therapy Evaluation Patient Details Name: [name] MRN: [id number] DOB: 10-11-1940 Today's Date: 06/18/2016    History of Present Illness Pt. is a 76 y.o. male who was admitted to [facility] with acute encephalopathy. Pt. PMHx includes: HTN, CAD, and Type II DM.   Clinical Impression   Pt. is a 76 y.o. male who was admitted to [facility] with acute encephalopathy. Pt. Education was provided about energy conservation/work simplification techniques. Pt. presents with weakness, limited activity tolerance, and limited functional mobility which hinder his ability to complete ADL, and IADL tasks. Pt. could benefit from skilled OT services for ADL training, A/E training, UE there. Ex, and pt. Education about HEP, and DME. Pt. Plans to go to his daughter's home upon discharge. Pt. Could benefit from follow-up OT services upon discharge.    Follow Up Recommendations  Home health OT    Equipment Recommendations       Recommendations for Other Services       Precautions / Restrictions Precautions Precautions: Fall             ADL either performed or assessed with clinical judgement   ADL Overall ADL's : Needs assistance/impaired Eating/Feeding: Set up   Grooming: Set up       Lower Body Bathing: Moderate assistance   Upper Body Dressing : Minimal assistance   Lower Body Dressing: Moderate assistance               Functional mobility during ADLs: Minimal assistance General ADL Comments: Pt. education was provided about A/E use for LE ADLs, energy conservation, and work simplification techniques.     Vision Patient Visual Report: No change from baseline       Perception     Praxis      Pertinent Vitals/Pain Pain Assessment: No/denies pain     Hand Dominance Right   Extremity/Trunk Assessment Upper Extremity Assessment Upper Extremity Assessment: Overall WFL for tasks assessed RUE Deficits / Details: Impaired R UE coordination: Inconsistencies to  finger to nose, with intermittent past pointing.         Communication Communication Communication: HOH   Cognition Arousal/Alertness: Awake/alert Behavior During Therapy: Impulsive Overall Cognitive Status: Within Functional Limits for tasks assessed                                 General Comments: Intermittently presented with slight confusion with commands, however, pt is HOH.   General Comments       Exercises    Shoulder Instructions      Home Living Family/patient expects to be discharged to:: Private residence Living Arrangements: Alone Available Help at Discharge: Available 24 hours/day;Family;Friend(s) Type of Home: Mobile home Home Access: Ramped entrance     Home Layout: One level     Bathroom Shower/Tub: Door         Home Equipment: Gilford Rile - 2 wheels;Crutches          Prior Functioning/Environment Level of Independence: Independent;Independent with assistive device(s)        Comments: Pt. was independent with ADLs, and IADLs, and driving.        OT Problem List: Decreased strength;Decreased activity tolerance;Decreased coordination;Decreased safety awareness;Decreased knowledge of use of DME or AE      OT Treatment/Interventions: Self-care/ADL training;Therapeutic exercise;Energy conservation;Patient/family education;Therapeutic activities;DME and/or AE instruction    OT Goals(Current goals can be found in the care plan section) Acute Rehab OT Goals Patient Stated Goal:  To regain independence OT Goal Formulation: With patient Potential to Achieve Goals: Good  OT Frequency: Min 1X/week   Barriers to D/C:            Co-evaluation              AM-PAC PT "6 Clicks" Daily Activity     Outcome Measure Help from another person eating meals?: None Help from another person taking care of personal grooming?: A Little Help from another person toileting, which includes using toliet, bedpan, or urinal?: A Lot Help from  another person bathing (including washing, rinsing, drying)?: A Lot Help from another person to put on and taking off regular upper body clothing?: A Little Help from another person to put on and taking off regular lower body clothing?: A Lot 6 Click Score: 16   End of Session    Activity Tolerance: Patient tolerated treatment well Patient left: in bed;with call bell/phone within reach  OT Visit Diagnosis: Muscle weakness (generalized) (M62.81);History of falling (Z91.81)                Time: 1410-1445 OT Time Calculation (min): 35 min Charges:  OT General Charges $OT Visit: 1 Procedure OT Evaluation $OT Eval Moderate Complexity: 1 Procedure G-Codes:    Harrel Carina, MS, OTR/L Harrel Carina, MS, OTR/L 06/18/2016, 3:36 PM

## 2016-06-18 NOTE — Plan of Care (Deleted)
Problem: Activity: Goal: Risk for activity intolerance will decrease Outcome: Not Progressing Patient continues to have significant dyspnea with minimal exertion.

## 2016-06-18 NOTE — Discharge Instructions (Signed)
Resume all home medications as before  Drink plenty of fluids

## 2016-06-18 NOTE — Progress Notes (Signed)
Central telemetry order expired. MD, Dr. Jannifer Franklin notified. Renewed order.  [name]

## 2016-06-18 NOTE — Progress Notes (Signed)
Dressings removed from abrasions on b/l elbows and knees.  Abrasion on left elbow scabbed over.  Abrasions on right elbow and b/l knees cleansed with NS and new dressings applied prior to discharge.

## 2016-06-18 NOTE — Progress Notes (Signed)
Discharge instructions reviewed with patient's daughter in detail.  Understanding was verbalized and all questions were answered.  Patient discharged home via wheelchair in stable condition escorted by nursing staff.

## 2016-06-18 NOTE — Evaluation (Signed)
Physical Therapy Evaluation Patient Details Name: [name] MRN: [id number] DOB: 1940/10/16 Today's Date: 06/18/2016   History of Present Illness  Pt is a 76 yo M admitted to acute care for acute encephalopathy, with signs of confusion, on 06/15/16. Prir to admission pt was independent with all ADL's and amb in the home with no AD. Pt is still driving and amb with RW when in the community. Prior to admission pt retired, but highly active, Scientist, research (physical sciences) and garden. PMH includes the following: HTN, CAD, and Type 2 diabetes mellitus.  Clinical Impression  Pt is a pleasant 76 y.o. M, admitted to acute care for acute encephalopathy. Pt supine in bed upon arrival with 3 family members present. Pt performs bed mobility with CGA, secondary to generalized muscle weakness and impulsivity. STS tranfers performed with min-modA, due to LE strength impairments: required less assist with multiple attempts. Pt required MinA with ambulation, using RW for UE support, secondary to impaired LE strength, endurance, impulsivity, and impaired safety awareness. Pt family supportive to PT throughout session. Pt presents with the following deficits: strength coordination, endurance, balance, and safety awareness. Overall, pt responded well to today's treatment with no adverse affects. Pt would benefit from skilled PT to address the previously mentioned impairments and promote return to PLOF. Family states they are able to get the pt 24 hr assist, should it be needed. Currently recommending Home Health PT with 24 hr supervision/assist pending d/c.     Follow Up Recommendations Home health PT;Supervision/Assistance - 24 hour    Equipment Recommendations  None recommended by PT    Recommendations for Other Services       Precautions / Restrictions Precautions Precautions: Fall      Mobility  Bed Mobility Overal bed mobility: Needs Assistance Bed Mobility: Supine to Sit     Supine to sit: Min guard      General bed mobility comments: CGA with STS, secondary to impaired strength, impulsivity, and impaired safety awareness.    Transfers Overall transfer level: Needs assistance Equipment used: Rolling walker (2 wheeled) Transfers: Sit to/from Stand Sit to Stand: Mod assist         General transfer comment: ModA to transfer from STS, secondary to impaired LE strength and "stiffness" in R knee from prior surgeries (per pt report). Pt education re: sequencing and hand placement.   Ambulation/Gait Ambulation/Gait assistance: Min assist Ambulation Distance (Feet): 50 Feet Assistive device: Rolling walker (2 wheeled) Gait Pattern/deviations: Decreased step length - right;Decreased step length - left;Step-through pattern     General Gait Details: Pt demo step through pattern with amb. MinA and RW required, secondary to impaired LE strength and balance. Pt is impulsive with amb and has tendency to look at feet. Required mod verbal and tactile cues to maintain close and appropriate proximity to the RW, look ahead for navigating obstacles, and manageing RW with turns and obstacle navigation.   Stairs            Wheelchair Mobility    Modified Rankin (Stroke Patients Only)       Balance Overall balance assessment: Needs assistance Sitting-balance support: Bilateral upper extremity supported;Feet supported   Sitting balance - Comments: Pt with tendency to lean to the L without B UE support when seated; required CGA to maintain safety.    Standing balance support: Bilateral upper extremity supported   Standing balance comment: Required minA and B UE support from RW to maintain balance in standing position.  Pertinent Vitals/Pain Pain Assessment: No/denies pain    Home Living Family/patient expects to be discharged to:: Private residence Living Arrangements:  (Lives alone) Available Help at Discharge: Friend(s);Family;Available 24  hours/day (Family states they could get 24/7 care for pt, if needed.) Type of Home: Mobile home Home Access: Ramped entrance     Home Layout: One level Home Equipment: Kennard - 2 wheels;Cane - single point;Crutches      Prior Function Level of Independence: Independent with assistive device(s)         Comments: Per pt, uses RW when navigating out of home. Amb independently within home.      Hand Dominance        Extremity/Trunk Assessment   Upper Extremity Assessment Upper Extremity Assessment: Overall WFL for tasks assessed;RUE deficits/detail RUE Deficits / Details: Impaired R UE coordination: Inconsistencies to finger to nose, with intermittent past pointing.      Lower Extremity Assessment Lower Extremity Assessment: Generalized weakness (MMT: grossly 4/5)       Communication   Communication: HOH  Cognition Arousal/Alertness: Awake/alert Behavior During Therapy: Impulsive Overall Cognitive Status: Within Functional Limits for tasks assessed                                 General Comments: Intermittently presented with slight confusion with commands, however, pt is HOH.      General Comments      Exercises Other Exercises Other Exercises: Supine therex performed with supervision to B LE's x10 reps: ankle pumps, quad sets, glute squeezes, SLR, adn hip abd. Other Exercises: Seated therex at EOB to B LE's with CGA x10: LAQ's.   Assessment/Plan    PT Assessment Patient needs continued PT services  PT Problem List Decreased strength;Decreased activity tolerance;Decreased balance;Decreased mobility;Decreased coordination;Decreased safety awareness;Decreased skin integrity       PT Treatment Interventions DME instruction;Gait training;Functional mobility training;Therapeutic activities;Therapeutic exercise;Balance training;Neuromuscular re-education;Patient/family education    PT Goals (Current goals can be found in the Care Plan section)   Acute Rehab PT Goals Patient Stated Goal: "to get out of the bed" PT Goal Formulation: With patient/family Time For Goal Achievement: 07/02/16 Potential to Achieve Goals: Good    Frequency Min 2X/week   Barriers to discharge        Co-evaluation               AM-PAC PT "6 Clicks" Daily Activity  Outcome Measure Difficulty turning over in bed (including adjusting bedclothes, sheets and blankets)?: Total Difficulty moving from lying on back to sitting on the side of the bed? : Total Difficulty sitting down on and standing up from a chair with arms (e.g., wheelchair, bedside commode, etc,.)?: Total Help needed moving to and from a bed to chair (including a wheelchair)?: A Lot Help needed walking in hospital room?: A Little Help needed climbing 3-5 steps with a railing? : A Lot 6 Click Score: 10    End of Session Equipment Utilized During Treatment: Gait belt Activity Tolerance: Patient tolerated treatment well Patient left: in chair;with call bell/phone within reach;with chair alarm set;with family/visitor present Nurse Communication: Mobility status PT Visit Diagnosis: Unsteadiness on feet (R26.81);Other abnormalities of gait and mobility (R26.89);Muscle weakness (generalized) (M62.81);History of falling (Z91.81)    Time: 3559-7416 PT Time Calculation (min) (ACUTE ONLY): 36 min   Charges:         PT G Codes:        Oran Rein PT,  SPT  Bevelyn Ngo 06/18/2016, 12:36 PM

## 2016-06-18 NOTE — Care Management Note (Signed)
Case Management Note  Patient Details  Name: [name] MRN: [id number] Date of Birth: Dec 06, 1940  Subjective/Objective:      Discussed discharge planning with daughter Dortha Schwalbe. [name] Luanne has no preference of home health providers for her Dad and agreed to a HH-PT referral to [facility] which serves the area where [name].               Action/Plan:   Expected Discharge Date:  06/18/16               Expected Discharge Plan:   06/18/16  In-House Referral:     Discharge planning Services     Post Acute Care Choice:   Yes Choice offered to:   daughter Dortha Schwalbe  DME Arranged:   None, has a RW at home.  DME Agency:     HH Arranged:   HH-PT HH Agency:   [facility]  Status of Service:   Completed.   If discussed at [place] of Stay Meetings, dates discussed:    Additional Comments:  Robertlee Rogacki A, RN 06/18/2016, 1:14 PM

## 2016-06-19 LAB — URINE CULTURE
Culture: NO GROWTH
SPECIAL REQUESTS: NORMAL

## 2016-06-19 LAB — HEMOGLOBIN A1C
HEMOGLOBIN A1C: 10.1 % — AB (ref 4.8–5.6)
MEAN PLASMA GLUCOSE: 243 mg/dL

## 2016-06-21 NOTE — Discharge Summary (Signed)
[place] [facility] [place] NAME: [name]    MR#:  [id number]  DATE OF BIRTH:  Aug 08, 1940  DATE OF ADMISSION:  06/15/2016 ADMITTING PHYSICIAN: Lance Coon, MD  DATE OF DISCHARGE: 06/18/2016  4:50 PM  PRIMARY CARE PHYSICIAN: Valera Castle, MD   ADMISSION DIAGNOSIS:  NSTEMI (non-ST elevated myocardial infarction) ([place]) [I21.4] Non-traumatic rhabdomyolysis [M62.82] Acute encephalopathy [G93.40]  DISCHARGE DIAGNOSIS:  Principal Problem:   Acute encephalopathy Active Problems:   Type 2 diabetes mellitus with circulatory disorder (HCC)   CAD (coronary artery disease)   Essential hypertension   Dehydration   Rhabdomyolysis   Pressure injury of skin   SECONDARY DIAGNOSIS:   Past Medical History:  Diagnosis Date  . BPH (benign prostatic hyperplasia)   . CAD (coronary artery disease)    a. 10/2014 Ant STEMI/PCI: LM nl, LAD 114m (2.75x38 Xience DES) - R->L collats, D1 60, D2 90ost, LCX 20ost, OM1/2/3 min irregs, RCA 34m (2.75x15 Xience DES), 50d, RPDA small;  b. 10/2014 Echo: EF 55-60%, mod ant/ap HK, Gr1 DD.  . Diabetes mellitus without complication ([place])   . Essential hypertension   . Hyperlipidemia   . Osteoarthritis      ADMITTING HISTORY  HISTORY OF PRESENT ILLNESS:  [name]  is a 76 y.o. male who presents with Confusion and weakness. Patient was found down in his home by his family members. Last time he was known to be well was several days ago when he was seen well by his neighbor. He has a number of abrasions on his body. Patient states that he's been feeling bad for the past few days, and was worried he might of had a stroke. He is unable to give significant details as to why he felt this. He does state that at some point had difficulty controlling on his arms, but does not clearly show the same symptoms tonight. He was found to have rhabdomyolysis here in the ED. Hospitalists were called for admission and further  evaluation and treatment   HOSPITAL COURSE:   # Acute encephalopathy likely due to exertional heatstroke Patient was admitted onto medical floor. His encephalopathy quickly resolved. MRI of the brain was done which was negative. No source of infection found. Urinalysis was normal.  Treated with IV fluids and symptomatic.   #Dehydration Started on IV fluids and resolved. Patient able to drink fluids   #Rhabdomyolysis due to heatstroke Treated with IV fluids and rhabdomyolysis has improved. CK levels are down to 500.   #Type 2 diabetes mellitus with circulatory disorder (HCC) - sliding scale insulin with corresponding glucose checks.patient is started on Lantus. Check hemoglobin A1c    #CAD (coronary artery disease)  Elevated troponin due to rhabdomyolysis. This has trended down. No chest pain or shortness of breath. No EKG changes. Marland Kitchen  #Essential hypertension - continue home meds  Patient on the day of discharge is ambulated with physical therapy. He is close to baseline. Advised to keep himself well-hydrated.  Discharged home with home health services.  CONSULTS OBTAINED:    DRUG ALLERGIES:  No Known Allergies  DISCHARGE MEDICATIONS:   Discharge Medication List as of 06/18/2016  1:08 PM    CONTINUE these medications which have NOT CHANGED   Details  atorvastatin (LIPITOR) 80 MG tablet Take 80 mg by mouth daily., Historical Med    clopidogrel (PLAVIX) 75 MG tablet Take 1 tablet (75 mg total) by mouth daily., Starting Tue 05/24/2016, Normal    docusate sodium (COLACE)  100 MG capsule Take 1 capsule (100 mg total) by mouth 2 (two) times daily., Starting Fri 07/31/2015, Normal    Dulaglutide (TRULICITY) 3.24 MW/1.0UV SOPN Inject 0.75 mg into the skin every 7 (seven) days., Historical Med    fluticasone (FLONASE) 50 MCG/ACT nasal spray Place 2 sprays into both nostrils daily., Historical Med    glucagon (GLUCAGON EMERGENCY) 1 MG injection Inject 1 mg into the vein  once as needed., Historical Med    lisinopril (PRINIVIL,ZESTRIL) 20 MG tablet Take 20 mg by mouth daily., Starting Thu 05/12/2016, Until Fri 05/12/2017, Historical Med    meloxicam (MOBIC) 7.5 MG tablet Take 7.5 mg by mouth daily., Starting Thu 07/23/2015, Until Fri 07/22/2016, Historical Med    metFORMIN (GLUCOPHAGE) 500 MG tablet Take 1,000 mg by mouth 2 (two) times daily., Starting Thu 05/12/2016, Until Tue 04/18/2017, Historical Med    nitroGLYCERIN (NITROSTAT) 0.4 MG SL tablet Place 1 tablet (0.4 mg total) under the tongue every 5 (five) minutes as needed for chest pain., Starting Tue 11/25/2014, Normal    sildenafil (REVATIO) 20 MG tablet Take 20 mg by mouth., Starting Thu 05/12/2016, Historical Med    tamsulosin (FLOMAX) 0.4 MG CAPS capsule Take 0.4 mg by mouth daily., Historical Med    aspirin 81 MG tablet Take 81 mg by mouth daily., Historical Med    carvedilol (COREG) 3.125 MG tablet Take 1 tablet (3.125 mg total) by mouth 2 (two) times daily with a meal., Starting Tue 11/25/2014, Normal    glucose (B-D GLUCOSE) 5 g chewable tablet Chew 3 tablets (15 g total) by mouth as needed for low blood sugar., Starting Fri 07/31/2015, Until Sat 07/30/2016, Normal    insulin aspart (NOVOLOG) 100 UNIT/ML injection Inject 16 units with meals three times daily. MDD 100 units., Historical Med    !! insulin glargine (LANTUS) 100 UNIT/ML injection Inject 38 Units into the skin. , Starting Mon 01/18/2016, Until Wed 01/18/2017, Historical Med    !! LANTUS 100 UNIT/ML injection Inject 0.25 mLs (25 Units total) into the skin every evening., Starting Fri 07/31/2015, No Print    magnesium oxide (MAG-OX) 400 MG tablet Take 400 mg by mouth daily., Starting Wed 09/23/2015, Until Thu 09/22/2016, Historical Med    ondansetron (ZOFRAN ODT) 4 MG disintegrating tablet Take 1 tablet (4 mg total) by mouth every 8 (eight) hours as needed for nausea or vomiting., Starting Mon 07/20/2015, Print     !! - Potential duplicate  medications found. Please discuss with provider.    STOP taking these medications     metoCLOPramide (REGLAN) 5 MG tablet      pantoprazole (PROTONIX) 40 MG tablet      polyethylene glycol (MIRALAX / GLYCOLAX) packet      senna-docusate (SENOKOT-S) 8.6-50 MG tablet         Today   VITAL SIGNS:  Blood pressure (!) 130/52, pulse 65, temperature 97.8 F (36.6 C), temperature source Oral, resp. rate 18, height 5\' 10"  (1.778 m), weight 70.6 kg (155 lb 11.2 oz), SpO2 100 %.  I/O:  No intake or output data in the 24 hours ending 06/21/16 1106  PHYSICAL EXAMINATION:  Physical Exam  GENERAL:  76 y.o.-year-old patient lying in the bed with no acute distress.  LUNGS: Normal breath sounds bilaterally, no wheezing, rales,rhonchi or crepitation. No use of accessory muscles of respiration.  CARDIOVASCULAR: S1, S2 normal. No murmurs, rubs, or gallops.  ABDOMEN: Soft, non-tender, non-distended. Bowel sounds present. No organomegaly or mass.  NEUROLOGIC: Moves all 4  extremities. PSYCHIATRIC: The patient is alert and oriented x 3.  SKIN: No obvious rash, lesion, or ulcer.   DATA REVIEW:   CBC  Recent Labs Lab 06/17/16 0841  WBC 10.3  HGB 12.5*  HCT 36.5*  PLT 196    Chemistries   Recent Labs Lab 06/15/16 2244  06/18/16 0518  NA 143  < > 139  K 4.0  < > 3.0*  CL 105  < > 100*  CO2 25  < > 33*  GLUCOSE 123*  < > 185*  BUN 54*  < > 24*  CREATININE 1.31*  < > 0.90  CALCIUM 9.5  < > 7.7*  MG 2.0  --   --   AST 94*  --   --   ALT 32  --   --   ALKPHOS 105  --   --   BILITOT 1.7*  --   --   < > = values in this interval not displayed.  Cardiac Enzymes  Recent Labs Lab 06/16/16 1644  TROPONINI 0.21*    Microbiology Results  Results for orders placed or performed during the hospital encounter of 06/15/16  Urine Culture     Status: None   Collection Time: 06/16/16  1:55 PM  Result Value Ref Range Status   Specimen Description URINE, CLEAN CATCH  Final   Special  Requests Normal  Final   Culture   Final    NO GROWTH Performed at [facility], [place] [address] [place], [place] [address]    Report Status 06/19/2016 FINAL  Final    RADIOLOGY:  No results found.  Follow up with PCP in 1 week.  Management plans discussed with the patient, family and they are in agreement.  CODE STATUS:  Code Status History    Date Active Date Inactive Code Status Order ID Comments User Context   06/16/2016  3:26 AM 06/18/2016  7:50 PM Full Code [id number]  Lance Coon, MD Inpatient   07/28/2015 10:38 PM 07/31/2015  3:36 PM Full Code [id number]  Harvie Bridge, DO Inpatient   10/20/2014 10:38 AM 10/22/2014  2:38 PM Full Code [id number]  Wellington Hampshire, MD Inpatient      TOTAL TIME TAKING CARE OF THIS PATIENT ON DAY OF DISCHARGE: more than 30 minutes.   Hillary Bow R M.D on 06/21/2016 at 11:06 AM  Between 7am to 6pm - Pager - [phone]  After 6pm go to [email or web address] - password [email or web address] [facility]  Office  [phone]  CC: Primary care physician; Valera Castle, MD  Note: This dictation was prepared with Dragon dictation along with smaller phrase technology. Any transcriptional errors that result from this process are unintentional.

## 2016-06-28 ENCOUNTER — Ambulatory Visit
Admission: RE | Admit: 2016-06-28 | Discharge: 2016-06-28 | Disposition: A | Discharge status: Home / Self Care | Payer: Medicare Other | Source: Ambulatory Visit | Attending: Internal Medicine | Admitting: Internal Medicine

## 2016-06-28 ENCOUNTER — Encounter: Payer: Medicare Other | Attending: Internal Medicine | Admitting: Internal Medicine

## 2016-06-28 ENCOUNTER — Other Ambulatory Visit: Payer: Self-pay | Admitting: Internal Medicine

## 2016-06-28 DIAGNOSIS — E119 Type 2 diabetes mellitus without complications: Secondary | ICD-10-CM | POA: Diagnosis present

## 2016-06-28 DIAGNOSIS — E11622 Type 2 diabetes mellitus with other skin ulcer: Secondary | ICD-10-CM | POA: Diagnosis not present

## 2016-06-28 DIAGNOSIS — S3091XA Unspecified superficial injury of lower back and pelvis, initial encounter: Secondary | ICD-10-CM | POA: Diagnosis present

## 2016-06-28 DIAGNOSIS — Z87891 Personal history of nicotine dependence: Secondary | ICD-10-CM | POA: Diagnosis not present

## 2016-06-28 DIAGNOSIS — X58XXXA Exposure to other specified factors, initial encounter: Secondary | ICD-10-CM | POA: Insufficient documentation

## 2016-06-28 DIAGNOSIS — S91011D Laceration without foreign body, right ankle, subsequent encounter: Secondary | ICD-10-CM | POA: Diagnosis not present

## 2016-06-28 DIAGNOSIS — M869 Osteomyelitis, unspecified: Secondary | ICD-10-CM

## 2016-06-28 DIAGNOSIS — S21219D Laceration without foreign body of unspecified back wall of thorax without penetration into thoracic cavity, subsequent encounter: Secondary | ICD-10-CM | POA: Diagnosis not present

## 2016-06-28 DIAGNOSIS — L89150 Pressure ulcer of sacral region, unstageable: Secondary | ICD-10-CM | POA: Insufficient documentation

## 2016-06-28 DIAGNOSIS — Z7984 Long term (current) use of oral hypoglycemic drugs: Secondary | ICD-10-CM | POA: Diagnosis not present

## 2016-06-28 DIAGNOSIS — X58XXXD Exposure to other specified factors, subsequent encounter: Secondary | ICD-10-CM | POA: Insufficient documentation

## 2016-06-29 NOTE — Progress Notes (Signed)
Charles, [name] ([id number]) Visit Report for 06/28/2016 Abuse/Suicide Risk Screen Details Patient Name: [name], [name] Date of Service: 06/28/2016 10:30 AM Medical Record Patient Account Number: [id number] Number: Treating RN: Ahmed Prima 1940-05-18 (75 y.o. Other Clinician: Date of Birth/Sex: Male) Treating ROBSON, MICHAEL Primary Care Jebidiah Baggerly: Johny Drilling Jezlyn Westerfield/Extender: G Referring Blondie Riggsbee: Nettie Elm in Treatment: 0 Abuse/Suicide Risk Screen Items Answer ABUSE/SUICIDE RISK SCREEN: Has anyone close to you tried to hurt or harm you recentlyo No Do you feel uncomfortable with anyone in your familyo No Has anyone forced you do things that you didnot want to doo No Do you have any thoughts of harming yourselfo No Patient displays signs or symptoms of abuse and/or neglect. No Electronic Signature(s) Signed: 06/28/2016 4:41:59 PM By: Alric Quan Entered By: Alric Quan on 06/28/2016 10:33:18 Padia, [name] ([id number]) -------------------------------------------------------------------------------- Activities of Daily Living Details Patient Name: [name] Date of Service: 06/28/2016 10:30 AM Medical Record Patient Account Number: [id number] Number: Treating RN: Ahmed Prima 1940/01/07 (75 y.o. Other Clinician: Date of Birth/Sex: Male) Treating ROBSON, MICHAEL Primary Care Celester Lech: Johny Drilling Thang Flett/Extender: G Referring Riham Polyakov: Nettie Elm in Treatment: 0 Activities of Daily Living Items Answer Activities of Daily Living (Please select one for each item) Drive Automobile Not Able Take Medications Need Assistance Use Telephone Completely Able Care for Appearance Completely Able Use Toilet Completely Able Bath / Shower Completely Able Dress Self Completely Able Feed Self Completely Able Walk Need Assistance Get In / Out Bed Need Assistance Housework Not Able Prepare Meals Need  Assistance Handle Money Need Assistance Shop for Self Need Assistance Electronic Signature(s) Signed: 06/28/2016 4:41:59 PM By: Alric Quan Entered By: Alric Quan on 06/28/2016 10:34:17 Radle, [name] ([id number]) -------------------------------------------------------------------------------- Education Assessment Details Patient Name: [name] Date of Service: 06/28/2016 10:30 AM Medical Record Patient Account Number: [id number] Number: Treating RN: Ahmed Prima 1940-10-19 (75 y.o. Other Clinician: Date of Birth/Sex: Male) Treating ROBSON, MICHAEL Primary Care Ceasar Decandia: Johny Drilling Loic Hobin/Extender: G Referring Yi Haugan: Nettie Elm in Treatment: 0 Primary Learner Assessed: Patient Learning Preferences/Education Level/Primary Language Learning Preference: Explanation, Printed Material Highest Education Level: High School Preferred Language: English Cognitive Barrier Assessment/Beliefs Language Barrier: No Translator Needed: No Memory Deficit: No Emotional Barrier: No Cultural/Religious Beliefs Affecting Medical No Care: Physical Barrier Assessment Impaired Vision: No Impaired Hearing: No Decreased Hand dexterity: No Knowledge/Comprehension Assessment Knowledge Level: Medium Comprehension Level: Medium Ability to understand written Medium instructions: Ability to understand verbal Medium instructions: Motivation Assessment Anxiety Level: Calm Cooperation: Cooperative Education Importance: Acknowledges Need Interest in Health Problems: Asks Questions Perception: Coherent Willingness to Engage in Self- Medium Management Activities: Readiness to Engage in Self- Medium Management Activities: JETTSON, CRABLE ([id number]) Electronic Signature(s) Signed: 06/28/2016 4:41:59 PM By: Alric Quan Entered By: Alric Quan on 06/28/2016 10:34:51 Pistilli, [name]  ([id number]) -------------------------------------------------------------------------------- Fall Risk Assessment Details Patient Name: [name] Date of Service: 06/28/2016 10:30 AM Medical Record Patient Account Number: [id number] Number: Treating RN: Ahmed Prima [birth year] (75 y.o. Other Clinician: Date of Birth/Sex: Male) Treating ROBSON, MICHAEL Primary Care Dlynn Ranes: Johny Drilling Lalanya Rufener/Extender: G Referring Najae Rathert: Nettie Elm in Treatment: 0 Fall Risk Assessment Items Have you had 2 or more falls in the last 12 monthso 0 Yes Have you had any fall that resulted in injury in the last 12 monthso 0 Yes FALL RISK ASSESSMENT: History of falling - immediate or within 3 months 25 Yes Secondary diagnosis 15 Yes Ambulatory aid None/bed rest/wheelchair/nurse 0 No  Crutches/cane/walker 15 Yes Furniture 0 No IV Access/Saline Lock 0 No Gait/Training Normal/bed rest/immobile 0 No Weak 0 No Impaired 0 No Mental Status Oriented to own ability 0 Yes Electronic Signature(s) Signed: 06/28/2016 4:41:59 PM By: Alric Quan Entered By: Alric Quan on 06/28/2016 10:35:14 Lasala, [name] ([id number]) -------------------------------------------------------------------------------- Foot Assessment Details Patient Name: [name] Date of Service: 06/28/2016 10:30 AM Medical Record Patient Account Number: [id number] Number: Treating RN: Ahmed Prima 19-Oct-1940 (75 y.o. Other Clinician: Date of Birth/Sex: Male) Treating ROBSON, MICHAEL Primary Care Krystianna Soth: Johny Drilling Trevontae Lindahl/Extender: G Referring Reshaun Briseno: Nettie Elm in Treatment: 0 Foot Assessment Items Site Locations + = Sensation present, - = Sensation absent, C = Callus, U = Ulcer R = Redness, W = Warmth, M = Maceration, PU = Pre-ulcerative lesion F = Fissure, S = Swelling, D = Dryness Assessment Right: Left: Other Deformity: No No Prior Foot Ulcer:  No No Prior Amputation: No No Charcot Joint: No No Ambulatory Status: Gait: Electronic Signature(s) Signed: 06/28/2016 4:41:59 PM By: Alric Quan Entered By: Alric Quan on 06/28/2016 10:35:42 Spainhower, [name] ([id number]) -------------------------------------------------------------------------------- Nutrition Risk Assessment Details Patient Name: [name] Date of Service: 06/28/2016 10:30 AM Medical Record Patient Account Number: [id number] Number: Treating RN: Ahmed Prima [birth year] (75 y.o. Other Clinician: Date of Birth/Sex: Male) Treating ROBSON, MICHAEL Primary Care Braylon Grenda: Johny Drilling [place]aclara Spear/Extender: G Referring Danzig Macgregor: Nettie Elm in Treatment: 0 Height (in): 69 Weight (lbs): 147.5 Body Mass Index (BMI): 21.8 Nutrition Risk Assessment Items NUTRITION RISK SCREEN: I have an illness or condition that made me change the kind and/or 2 Yes amount of food I eat I eat fewer than two meals per day 3 Yes I eat few fruits and vegetables, or milk products 0 No I have three or more drinks of beer, liquor or wine almost every day 0 No I have tooth or mouth problems that make it hard for me to eat 0 No I don't always have enough money to buy the food I need 0 No I eat alone most of the time 0 No I take three or more different prescribed or over-the-counter drugs a 1 Yes day Without wanting to, I have lost or gained 10 pounds in the last six 0 No months I am not always physically able to shop, cook and/or feed myself 0 No Nutrition Protocols Good Risk Protocol Moderate Risk Protocol Electronic Signature(s) Signed: 06/28/2016 4:41:59 PM By: Alric Quan Entered By: Alric Quan on 06/28/2016 10:35:30

## 2016-06-29 NOTE — Progress Notes (Signed)
Charles, [name] ([id number]) Visit Report for 06/28/2016 Chief Complaint Document Details Patient Name: [name], [name] Date of Service: 06/28/2016 10:30 AM Medical Record Patient Account Number: [id number] Number: Treating RN: Ahmed Prima Mar 17, 1940 (76 y.o. Other Clinician: Date of Birth/Sex: Male) Treating Mylissa Lambe Primary Care Provider: Johny Drilling Provider/Extender: G Referring Provider: Nettie Elm in Treatment: 0 Information Obtained from: Patient Chief Complaint 06/28/16; patient is here accompanied by his daughter for review of multiple pressure ulcerations predominantly his lower sacrum/coccyx and surrounding buttock's Electronic Signature(s) Signed: 06/28/2016 4:49:17 PM By: Linton Ham MD Entered By: Linton Ham on 06/28/2016 12:09:22 Gaster, [name] ([id number]) -------------------------------------------------------------------------------- Debridement Details Patient Name: [name] Date of Service: 06/28/2016 10:30 AM Medical Record Patient Account Number: [id number] Number: Treating RN: Ahmed Prima [birth year] (76 y.o. Other Clinician: Date of Birth/Sex: Male) Treating Bannon Giammarco Primary Care Provider: Johny Drilling Provider/Extender: G Referring Provider: Nettie Elm in Treatment: 0 Debridement Performed for Wound #2 Midline Back Assessment: Performed By: Physician Ricard Dillon, MD Debridement: Debridement Pre-procedure Verification/Time Out Yes - 11:22 Taken: Start Time: 11:23 Pain Control: Lidocaine 4% Topical Solution Level: Skin/Subcutaneous Tissue Total Area Debrided (L x 1.5 (cm) x 1.5 (cm) = 2.25 (cm) W): Tissue and other Viable, Non-Viable, Exudate, Fibrin/Slough, Subcutaneous material debrided: Instrument: Curette Bleeding: Minimum Hemostasis Achieved: Pressure End Time: 11:24 Procedural Pain: 0 Post Procedural Pain: 0 Response to Treatment: Procedure  was tolerated well Post Debridement Measurements of Total Wound Length: (cm) 1.5 Width: (cm) 1.5 Depth: (cm) 0.1 Volume: (cm) 0.177 Character of Wound/Ulcer Post Requires Further Debridement Debridement: Post Procedure Diagnosis Same as Pre-procedure Electronic Signature(s) Signed: 06/28/2016 4:41:59 PM By: Alric Quan Signed: 06/28/2016 4:49:17 PM By: Linton Ham MD Entered By: Linton Ham on 06/28/2016 12:08:24 Dowler, [name] ([id number]) Kinney, [name] ([id number]) -------------------------------------------------------------------------------- Debridement Details Patient Name: [name] Date of Service: 06/28/2016 10:30 AM Medical Record Patient Account Number: [id number] Number: Treating RN: Ahmed Prima January 17, 1940 (76 y.o. Other Clinician: Date of Birth/Sex: Male) Treating Zohar Laing Primary Care Provider: Johny Drilling Provider/Extender: G Referring Provider: Nettie Elm in Treatment: 0 Debridement Performed for Wound #3 Right Back Assessment: Performed By: Physician Ricard Dillon, MD Debridement: Debridement Pre-procedure Verification/Time Out Yes - 11:22 Taken: Start Time: 11:25 Pain Control: Lidocaine 4% Topical Solution Level: Skin/Subcutaneous Tissue Total Area Debrided (L x 0.4 (cm) x 2.3 (cm) = 0.92 (cm) W): Tissue and other Viable, Non-Viable, Exudate, Fibrin/Slough, Subcutaneous material debrided: Instrument: Curette Bleeding: Minimum Hemostasis Achieved: Pressure End Time: 11:26 Procedural Pain: 0 Post Procedural Pain: 0 Response to Treatment: Procedure was tolerated well Post Debridement Measurements of Total Wound Length: (cm) 0.4 Width: (cm) 2.3 Depth: (cm) 0.1 Volume: (cm) 0.072 Character of Wound/Ulcer Post Requires Further Debridement Debridement: Post Procedure Diagnosis Same as Pre-procedure Electronic Signature(s) Signed: 06/28/2016 4:41:59 PM By: Alric Quan Signed: 06/28/2016 4:49:17 PM By: Linton Ham MD Entered By: Linton Ham on 06/28/2016 12:08:34 Kennan, [name] ([id number]) Meckler, [name] ([id number]) -------------------------------------------------------------------------------- Debridement Details Patient Name: [name] Date of Service: 06/28/2016 10:30 AM Medical Record Patient Account Number: [id number] Number: Treating RN: Ahmed Prima 05/23/1940 (76 y.o. Other Clinician: Date of Birth/Sex: Male) Treating Aaren Atallah Primary Care Provider: Johny Drilling Provider/Extender: G Referring Provider: Nettie Elm in Treatment: 0 Debridement Performed for Wound #6 Right,Lateral Malleolus Assessment: Performed By: Physician Ricard Dillon, MD Debridement: Debridement Pre-procedure Verification/Time Out Yes - 11:22 Taken: Start Time: 11:26 Pain Control: Lidocaine 4% Topical Solution  Level: Skin/Subcutaneous Tissue Total Area Debrided (L x 0.7 (cm) x 0.4 (cm) = 0.28 (cm) W): Tissue and other Viable, Non-Viable, Exudate, Fibrin/Slough, Subcutaneous material debrided: Instrument: Curette Bleeding: Minimum Hemostasis Achieved: Pressure End Time: 11:27 Procedural Pain: 0 Post Procedural Pain: 0 Response to Treatment: Procedure was tolerated well Post Debridement Measurements of Total Wound Length: (cm) 0.7 Width: (cm) 0.4 Depth: (cm) 0 Volume: (cm) 0 Character of Wound/Ulcer Post Requires Further Debridement Debridement: Post Procedure Diagnosis Same as Pre-procedure Electronic Signature(s) Signed: 06/28/2016 4:41:59 PM By: Alric Quan Signed: 06/28/2016 4:49:17 PM By: Linton Ham MD Entered By: Linton Ham on 06/28/2016 12:08:46 Cadenhead, [name] ([id number]) Upperman, [name] ([id number]) -------------------------------------------------------------------------------- HPI Details Patient Name: [name] Date of Service: 06/28/2016 10:30  AM Medical Record Patient Account Number: [id number] Number: Treating RN: Ahmed Prima 11/28/1940 (76 y.o. Other Clinician: Date of Birth/Sex: Male) Treating Deejay Koppelman Primary Care Provider: Johny Drilling Provider/Extender: G Referring Provider: Nettie Elm in Treatment: 0 History of Present Illness HPI Description: 06/28/16; this is a 76 year old man who was admitted to hospital from 6/13 through 06/18/16 at [facility] regional after being found down in a closet in his home. It is not exactly clear how long he was actually there although it was probably for days. His total CK maximum was at 2582. He was admitted with delirium, possibly heat related illness, possible subendocardial MI. An MRI of the brain was negative. During this hospitalization he was noted to have areas of pressure-related injury for the time he was spent on the floor on his back he was stated to have a stage II pressure ulcer over his buttocks. Multiple excoriations were also noted. He has advanced home care about doing physical therapy there requesting a nurse which seems reasonable. The patient is a diabetic on metformin. They're using bacitracin to all these areas. He was prescribed doxycycline which I believe he is still on. Electronic Signature(s) Signed: 06/28/2016 4:49:17 PM By: Linton Ham MD Entered By: Linton Ham on 06/28/2016 12:13:21 Soza, [name] ([id number]) -------------------------------------------------------------------------------- Physical Exam Details Patient Name: [name] Date of Service: 06/28/2016 10:30 AM Medical Record Patient Account Number: [id number] Number: Treating RN: Ahmed Prima Jul 26, 1940 (75 y.o. Other Clinician: Date of Birth/Sex: Male) Treating Lyman Balingit Primary Care Provider: Johny Drilling Provider/Extender: G Referring Provider: Nettie Elm in Treatment: 0 Constitutional Patient is hypotensive.  Does not appear unwell. Pulse regular and within target range for patient.Marland Kitchen Respirations regular, non-labored and within target range.. Temperature is normal and within the target range for the patient.Marland Kitchen appears in no distress. Eyes Conjunctivae clear. No discharge. Ears, Nose, Mouth, and Throat Patient is hard of hearing. Hearing aid in left ear. Dukas membranes appear moist. Respiratory Respiratory effort is easy and symmetric bilaterally. Rate is normal at rest and on room air.. Bilateral breath sounds are clear and equal in all lobes with no wheezes, rales or rhonchi.. Cardiovascular Heart rhythm and rate regular, without murmur or gallop. Skin turgor marginal. Pedal pulses palpable and strong bilaterally.. Gastrointestinal (GI) Abdomen is soft and non-distended without masses or tenderness. Bowel sounds active in all quadrants.. No liver or spleen enlargement or tenderness.. Lymphatic Nonpalpable the popliteal or inguinal area. Musculoskeletal Patient appeared to be walking in a steady fashion with his cane on discharge. Integumentary (Hair, Skin) Multiple skin excoriations back lower extremities. Psychiatric No evidence of depression, anxiety, or agitation. Calm, cooperative, and communicative. Appropriate interactions and affect.. Notes Wound exam; the patient has several identifiable wounds in  our clinic today however the major issue here is on the lower sacrum/coccyx and surrounding soft tissue right greater than left. These are unstageable wounds with dark necrotic eschar over the surface. There is some surrounding erythema but no crepitus and no tenderness and minimal tenderness. Nevertheless this is a worrisome area. oHe has an area on the right lateral malleolus with surface eschar and nonviable subcutaneous tissue which I debridement with a #3 curet o2 areas over the scapular area of his upper back also covered with surface eschar and nonviable Groman, Marquan K.  ([id number]) subcutaneous tissue which also debridement with a #3 curet to a healthy surface. oStill very healthy-looking wounds over the lateral aspect of both knees these are superficial and I expect will he heel without any difficulty Electronic Signature(s) Signed: 06/28/2016 4:49:17 PM By: Linton Ham MD Entered By: Linton Ham on 06/28/2016 12:17:04 Vullo, [name] ([id number]) -------------------------------------------------------------------------------- Physician Orders Details Patient Name: [name] Date of Service: 06/28/2016 10:30 AM Medical Record Patient Account Number: [id number] Number: Treating RN: Ahmed Prima [birth year] (75 y.o. Other Clinician: Date of Birth/Sex: Male) Treating Tonga Prout Primary Care Provider: Johny Drilling Provider/Extender: G Referring Provider: Nettie Elm in Treatment: 0 Verbal / Phone Orders: Yes Clinician: Carolyne Fiscal, Debi Read Back and Verified: Yes Diagnosis Coding Wound Cleansing Wound #1 Sacrum o Clean wound with Normal Saline. Wound #2 Midline Back o Clean wound with Normal Saline. Wound #3 Right Back o Clean wound with Normal Saline. Wound #6 Right,Lateral Malleolus o Clean wound with Normal Saline. Anesthetic Wound #1 Sacrum o Topical Lidocaine 4% cream applied to wound bed prior to debridement - for clinic use Wound #2 Midline Back o Topical Lidocaine 4% cream applied to wound bed prior to debridement - for clinic use Wound #3 Right Back o Topical Lidocaine 4% cream applied to wound bed prior to debridement - for clinic use Wound #6 Right,Lateral Malleolus o Topical Lidocaine 4% cream applied to wound bed prior to debridement - for clinic use Skin Barriers/Peri-Wound Care Wound #1 Sacrum o Skin Prep Wound #2 Midline Back o Skin Prep Wound #3 Right Back o Skin Prep Smartt, Tarick K. ([id number]) Wound #6 Right,Lateral Malleolus o Skin Prep Primary  Wound Dressing Wound #1 Sacrum o Santyl Ointment o Medihoney gel - if pt is not able to get the Santyl Wound #2 Midline Back o Other: - Bacitracin Wound #3 Right Back o Other: - Bacitracin Wound #6 Right,Lateral Malleolus o Prisma Ag - moisten with saline Secondary Dressing Wound #1 Sacrum o Dry Gauze o Boardered Foam Dressing Wound #2 Midline Back o Dry Gauze o Boardered Foam Dressing Wound #3 Right Back o Dry Gauze o Boardered Foam Dressing Wound #6 Right,Lateral Malleolus o Dry Gauze o Boardered Foam Dressing Dressing Change Frequency Wound #1 Sacrum o Change dressing every other day. Wound #2 Midline Back o Change dressing every other day. Wound #3 Right Back o Change dressing every other day. Wound #6 Right,Lateral Malleolus o Change dressing every other day. PURL, CLAYTOR ([id number]) Follow-up Appointments Wound #1 Sacrum o Return Appointment in 1 week. Wound #2 Midline Back o Return Appointment in 1 week. Wound #3 Right Back o Return Appointment in 1 week. Wound #6 Right,Lateral Malleolus o Return Appointment in 1 week. Off-Loading Wound #1 Sacrum o Mattress - HHRN to order mattress overlay o Turn and reposition every 2 hours Wound #2 Midline Back o Mattress - HHRN to order mattress overlay o Turn and reposition every 2 hours Wound #3  Right Back o Mattress - HHRN to order mattress overlay o Turn and reposition every 2 hours Wound #6 Right,Lateral Malleolus o Mattress - HHRN to order mattress overlay o Turn and reposition every 2 hours Additional Orders / Instructions Wound #1 Sacrum o Increase protein intake. Wound #2 Midline Back o Increase protein intake. Wound #3 Right Back o Increase protein intake. Wound #6 Right,Lateral Malleolus o Increase protein intake. Home Health Wound #1 [place] for [place] Nurse may visit PRN to  address patientos wound care needs. TAAVI, HOOSE ([id number]) o FACE TO FACE ENCOUNTER: MEDICARE and MEDICAID PATIENTS: I certify that this patient is under my care and that I had a face-to-face encounter that meets the physician face-to-face encounter requirements with this patient on this date. The encounter with the patient was in whole or in part for the following MEDICAL CONDITION: (primary reason for [place]) MEDICAL NECESSITY: I certify, that based on my findings, NURSING services are a medically necessary home health service. HOME BOUND STATUS: I certify that my clinical findings support that this patient is homebound (i.e., Due to illness or injury, pt requires aid of supportive devices such as crutches, cane, wheelchairs, walkers, the use of special transportation or the assistance of another person to leave their place of residence. There is a normal inability to leave the home and doing so requires considerable and taxing effort. Other absences are for medical reasons / religious services and are infrequent or of short duration when for other reasons). o If current dressing causes regression in wound condition, may D/C ordered dressing product/s and apply Normal Saline Moist Dressing daily until next [place] / Other MD appointment. [place] of regression in wound condition at [phone]. o Please direct any NON-WOUND related issues/requests for orders to patient's Primary Care Physician Wound #2 [place] for [place] Nurse may visit PRN to address patientos wound care needs. o FACE TO FACE ENCOUNTER: MEDICARE and MEDICAID PATIENTS: I certify that this patient is under my care and that I had a face-to-face encounter that meets the physician face-to-face encounter requirements with this patient on this date. The encounter with the patient was in whole or in part for the  following MEDICAL CONDITION: (primary reason for [place]) MEDICAL NECESSITY: I certify, that based on my findings, NURSING services are a medically necessary home health service. HOME BOUND STATUS: I certify that my clinical findings support that this patient is homebound (i.e., Due to illness or injury, pt requires aid of supportive devices such as crutches, cane, wheelchairs, walkers, the use of special transportation or the assistance of another person to leave their place of residence. There is a normal inability to leave the home and doing so requires considerable and taxing effort. Other absences are for medical reasons / religious services and are infrequent or of short duration when for other reasons). o If current dressing causes regression in wound condition, may D/C ordered dressing product/s and apply Normal Saline Moist Dressing daily until next [place] / Other MD appointment. [place] of regression in wound condition at [phone]. o Please direct any NON-WOUND related issues/requests for orders to patient's Primary Care Physician Wound #3 Right Back o [place] for [place] Nurse may visit PRN to address patientos wound care needs. o FACE TO FACE ENCOUNTER: MEDICARE and MEDICAID PATIENTS: I certify that  this patient is under my care and that I had a face-to-face encounter that meets the physician face-to-face encounter requirements with this patient on this date. The encounter with the patient was in whole or in part for the following MEDICAL CONDITION: (primary reason for [place]) MEDICAL NECESSITY: I certify, that based on my findings, NURSING services are a medically necessary home health service. HOME BOUND STATUS: I certify that my clinical findings support that this patient is homebound (i.e., Due to illness or injury, pt requires aid of supportive devices such as crutches, cane,  wheelchairs, walkers, the use of special [place], Jermarcus K. ([id number]) transportation or the assistance of another person to leave their place of residence. There is a normal inability to leave the home and doing so requires considerable and taxing effort. Other absences are for medical reasons / religious services and are infrequent or of short duration when for other reasons). o If current dressing causes regression in wound condition, may D/C ordered dressing product/s and apply Normal Saline Moist Dressing daily until next [place] / Other MD appointment. [place] of regression in wound condition at [phone]. o Please direct any NON-WOUND related issues/requests for orders to patient's Primary Care Physician Wound #6 [place] for [place] Nurse may visit PRN to address patientos wound care needs. o FACE TO FACE ENCOUNTER: MEDICARE and MEDICAID PATIENTS: I certify that this patient is under my care and that I had a face-to-face encounter that meets the physician face-to-face encounter requirements with this patient on this date. The encounter with the patient was in whole or in part for the following MEDICAL CONDITION: (primary reason for [place]) MEDICAL NECESSITY: I certify, that based on my findings, NURSING services are a medically necessary home health service. HOME BOUND STATUS: I certify that my clinical findings support that this patient is homebound (i.e., Due to illness or injury, pt requires aid of supportive devices such as crutches, cane, wheelchairs, walkers, the use of special transportation or the assistance of another person to leave their place of residence. There is a normal inability to leave the home and doing so requires considerable and taxing effort. Other absences are for medical reasons / religious services and are infrequent or of short duration when  for other reasons). o If current dressing causes regression in wound condition, may D/C ordered dressing product/s and apply Normal Saline Moist Dressing daily until next [place] / Other MD appointment. [place] of regression in wound condition at [phone]. o Please direct any NON-WOUND related issues/requests for orders to patient's Primary Care Physician Medications-please add to medication list. Wound #1 Sacrum o Other: - Vitamin C, Zinc, MVI Wound #2 Midline Back o Other: - Vitamin C, Zinc, MVI Wound #3 Right Back o Other: - Vitamin C, Zinc, MVI Wound #6 Right,Lateral Malleolus o Other: - Vitamin C, Zinc, MVI Radiology o X-ray, coccyx - and sacrum Dunlop, KARLIN HEILMAN ([id number]) Electronic Signature(s) Signed: 06/28/2016 4:41:59 PM By: Alric Quan Signed: 06/28/2016 4:49:17 PM By: Linton Ham MD Entered By: Alric Quan on 06/28/2016 13:06:45 Scaife, [name] ([id number]) -------------------------------------------------------------------------------- Problem List Details Patient Name: [name] Date of Service: 06/28/2016 10:30 AM Medical Record Patient Account Number: [id number] Number: Treating RN: Ahmed Prima 1940-10-26 (75 y.o. Other Clinician: Date of Birth/Sex: Male) Treating Clive Parcel Primary Care Provider: Johny Drilling Provider/Extender: G Referring Provider: Nettie Elm in Treatment: 0 Active Problems ICD-10  Encounter Code Description Active Date Diagnosis L89.150 Pressure ulcer of sacral region, unstageable 06/28/2016 Yes S91.011D Laceration without foreign body, right ankle, subsequent 06/28/2016 Yes encounter S21.219D Laceration without foreign body of unspecified back wall 06/28/2016 Yes of thorax without penetration into thoracic cavity, subsequent encounter E11.622 Type 2 diabetes mellitus with other skin ulcer 06/28/2016 Yes Inactive Problems Resolved  Problems Electronic Signature(s) Signed: 06/28/2016 4:49:17 PM By: Linton Ham MD Entered By: Linton Ham on 06/28/2016 12:08:02 Cyr, [name] ([id number]) -------------------------------------------------------------------------------- Progress Note Details Patient Name: [name] Date of Service: 06/28/2016 10:30 AM Medical Record Patient Account Number: [id number] Number: Treating RN: Ahmed Prima [birth year] (75 y.o. Other Clinician: Date of Birth/Sex: Male) Treating Makail Watling Primary Care Provider: Johny Drilling Provider/Extender: G Referring Provider: Nettie Elm in Treatment: 0 Subjective Chief Complaint Information obtained from Patient 06/28/16; patient is here accompanied by his daughter for review of multiple pressure ulcerations predominantly his lower sacrum/coccyx and surrounding buttock's History of Present Illness (HPI) 06/28/16; this is a 75 year old man who was admitted to hospital from 6/13 through 06/18/16 at [facility] after being found down in a closet in his home. It is not exactly clear how long he was actually there although it was probably for days. His total CK maximum was at 2582. He was admitted with delirium, possibly heat related illness, possible subendocardial MI. An MRI of the brain was negative. During this hospitalization he was noted to have areas of pressure-related injury for the time he was spent on the floor on his back he was stated to have a stage II pressure ulcer over his buttocks. Multiple excoriations were also noted. He has advanced home care about doing physical therapy there requesting a nurse which seems reasonable. The patient is a diabetic on metformin. They're using bacitracin to all these areas. He was prescribed doxycycline which I believe he is still on. Wound History Patient presents with 6 open wounds that have been present for approximately 2 weeks. Patient has been treating  wounds in the following manner: bacitracin. Laboratory tests have not been performed in the last month. Patient reportedly has not tested positive for an antibiotic resistant organism. Patient reportedly has not tested positive for osteomyelitis. Patient reportedly has not had testing performed to evaluate circulation in the legs. Patient experiences the following problems associated with their wounds: swelling. Patient History Information obtained from Patient. Allergies NKDA Family History Diabetes - Siblings, No family history of Cancer, Heart Disease, Hereditary Spherocytosis, Hypertension, Kidney Disease, Lung Disease, Seizures, Stroke, Thyroid Problems, Tuberculosis. TYJON, BOWEN ([id number]) Social History Former smoker - quit 60 yrs ago, Marital Status - Widowed, Alcohol Use - Never, Drug Use - No History, Caffeine Use - Never. Objective Constitutional Patient is hypotensive. Does not appear unwell. Pulse regular and within target range for patient.Marland Kitchen Respirations regular, non-labored and within target range.. Temperature is normal and within the target range for the patient.Marland Kitchen appears in no distress. Vitals Time Taken: 10:27 AM, Height: 69 in, Source: Stated, Weight: 147.5 lbs, Source: Measured, BMI: 21.8, Temperature: 97.5 F, Pulse: 64 bpm, Respiratory Rate: 16 breaths/min, Blood Pressure: 91/46 mmHg. Eyes Conjunctivae clear. No discharge. Ears, Nose, Mouth, and Throat Patient is hard of hearing. Hearing aid in left ear. Dukas membranes appear moist. Respiratory Respiratory effort is easy and symmetric bilaterally. Rate is normal at rest and on room air.. Bilateral breath sounds are clear and equal in all lobes with no wheezes, rales or rhonchi.. Cardiovascular Heart rhythm and rate regular, without murmur  or gallop. Skin turgor marginal. Pedal pulses palpable and strong bilaterally.. Gastrointestinal (GI) Abdomen is soft and non-distended without masses or  tenderness. Bowel sounds active in all quadrants.. No liver or spleen enlargement or tenderness.. Lymphatic Nonpalpable the popliteal or inguinal area. Musculoskeletal Patient appeared to be walking in a steady fashion with his cane on discharge. TAITE, SCHOEPPNER ([id number]) Psychiatric No evidence of depression, anxiety, or agitation. Calm, cooperative, and communicative. Appropriate interactions and affect.. General Notes: Wound exam; the patient has several identifiable wounds in our clinic today however the major issue here is on the lower sacrum/coccyx and surrounding soft tissue right greater than left. These are unstageable wounds with dark necrotic eschar over the surface. There is some surrounding erythema but no crepitus and no tenderness and minimal tenderness. Nevertheless this is a worrisome area. He has an area on the right lateral malleolus with surface eschar and nonviable subcutaneous tissue which I debridement with a #3 curet 2 areas over the scapular area of his upper back also covered with surface eschar and nonviable subcutaneous tissue which also debridement with a #3 curet to a healthy surface. Still very healthy-looking wounds over the lateral aspect of both knees these are superficial and I expect will he heel without any difficulty Integumentary (Hair, Skin) Multiple skin excoriations back lower extremities. Wound #1 status is Open. Original cause of wound was Pressure Injury. The wound is located on the Sacrum. The wound measures 5.2cm length x 9.3cm width x 0.2cm depth; 37.982cm^2 area and 7.596cm^3 volume. There is no tunneling or undermining noted. There is a large amount of serosanguineous drainage noted. The wound margin is distinct with the outline attached to the wound base. There is no granulation within the wound bed. There is a large (67-100%) amount of necrotic tissue within the wound bed including Eschar and Adherent Slough. The periwound skin  appearance exhibited: Dry/Scaly, Erythema. The surrounding wound skin color is noted with erythema which is circumferential. Periwound temperature was noted as No Abnormality. The periwound has tenderness on palpation. Wound #2 status is Open. Original cause of wound was Trauma. The wound is located on the Midline Back. The wound measures 1.5cm length x 1.5cm width x 0.1cm depth; 1.767cm^2 area and 0.177cm^3 volume. There is no tunneling or undermining noted. There is a large amount of serosanguineous drainage noted. The wound margin is flat and intact. There is no granulation within the wound bed. There is a large (67- 100%) amount of necrotic tissue within the wound bed including Adherent Slough. Periwound temperature was noted as No Abnormality. The periwound has tenderness on palpation. Wound #3 status is Open. Original cause of wound was Trauma. The wound is located on the Right Back. The wound measures 0.4cm length x 2.3cm width x 0.1cm depth; 0.723cm^2 area and 0.072cm^3 volume. There is no tunneling or undermining noted. There is a large amount of serosanguineous drainage noted. The wound margin is flat and intact. There is no granulation within the wound bed. There is a large (67- 100%) amount of necrotic tissue within the wound bed including Eschar and Adherent Slough. Periwound temperature was noted as No Abnormality. The periwound has tenderness on palpation. Wound #6 status is Open. Original cause of wound was Trauma. The wound is located on the Right,Lateral Malleolus. The wound measures 0.7cm length x 0.4cm width x 0.1cm depth; 0.22cm^2 area and 0.022cm^3 volume. There is no undermining noted. There is a large amount of serosanguineous drainage noted. The wound margin is flat and intact. There  is no granulation within the wound bed. There is a large (67-100%) amount of necrotic tissue within the wound bed including Eschar. Periwound temperature was noted as No Abnormality. The  periwound has tenderness on palpation. SHAMEEK, NYQUIST ([id number]) Assessment Active Problems ICD-10 L89.150 - Pressure ulcer of sacral region, unstageable S91.011D - Laceration without foreign body, right ankle, subsequent encounter S21.219D - Laceration without foreign body of unspecified back wall of thorax without penetration into thoracic cavity, subsequent encounter E11.622 - Type 2 diabetes mellitus with other skin ulcer Procedures Wound #2 Pre-procedure diagnosis of Wound #2 is a Trauma, Other located on the Midline Back . There was a Skin/Subcutaneous Tissue Debridement ([id number]) debridement with total area of 2.25 sq cm performed by Ricard Dillon, MD. with the following instrument(s): Curette to remove Viable and Non-Viable tissue/material including Exudate, Fibrin/Slough, and Subcutaneous after achieving pain control using Lidocaine 4% Topical Solution. A time out was conducted at 11:22, prior to the start of the procedure. A Minimum amount of bleeding was controlled with Pressure. The procedure was tolerated well with a pain level of 0 throughout and a pain level of 0 following the procedure. Post Debridement Measurements: 1.5cm length x 1.5cm width x 0.1cm depth; 0.177cm^3 volume. Character of Wound/Ulcer Post Debridement requires further debridement. Post procedure Diagnosis Wound #2: Same as Pre-Procedure Wound #3 Pre-procedure diagnosis of Wound #3 is a Trauma, Other located on the Right Back . There was a Skin/Subcutaneous Tissue Debridement ([id number]) debridement with total area of 0.92 sq cm performed by Ricard Dillon, MD. with the following instrument(s): Curette to remove Viable and Non-Viable tissue/material including Exudate, Fibrin/Slough, and Subcutaneous after achieving pain control using Lidocaine 4% Topical Solution. A time out was conducted at 11:22, prior to the start of the procedure. A Minimum amount of bleeding was controlled with  Pressure. The procedure was tolerated well with a pain level of 0 throughout and a pain level of 0 following the procedure. Post Debridement Measurements: 0.4cm length x 2.3cm width x 0.1cm depth; 0.072cm^3 volume. Character of Wound/Ulcer Post Debridement requires further debridement. Post procedure Diagnosis Wound #3: Same as Pre-Procedure Wound #6 Pre-procedure diagnosis of Wound #6 is a Trauma, Other located on the Right,Lateral Malleolus . There was a Skin/Subcutaneous Tissue Debridement ([id number]) debridement with total area of 0.28 sq cm performed by Ricard Dillon, MD. with the following instrument(s): Curette to remove Viable and Non-Viable tissue/material including Exudate, Fibrin/Slough, and Subcutaneous after achieving pain control using Lidocaine 4% Topical Solution. A time out was conducted at 11:22, prior to the start of the procedure. Mcdermid, Marrell K. ([id number]) A Minimum amount of bleeding was controlled with Pressure. The procedure was tolerated well with a pain level of 0 throughout and a pain level of 0 following the procedure. Post Debridement Measurements: 0.7cm length x 0.4cm width x 0cm depth; 0cm^3 volume. Character of Wound/Ulcer Post Debridement requires further debridement. Post procedure Diagnosis Wound #6: Same as Pre-Procedure Plan Wound Cleansing: Wound #1 Sacrum: Clean wound with Normal Saline. Wound #2 Midline Back: Clean wound with Normal Saline. Wound #3 Right Back: Clean wound with Normal Saline. Wound #4 Right Knee: Clean wound with Normal Saline. Wound #5 Left,Lateral Knee: Clean wound with Normal Saline. Wound #6 Right,Lateral Malleolus: Clean wound with Normal Saline. Anesthetic: Wound #1 Sacrum: Topical Lidocaine 4% cream applied to wound bed prior to debridement - for clinic use Wound #2 Midline Back: Topical Lidocaine 4% cream applied to wound bed prior to debridement - for clinic use Wound #  3 Right Back: Topical Lidocaine 4%  cream applied to wound bed prior to debridement - for clinic use Wound #4 Right Knee: Topical Lidocaine 4% cream applied to wound bed prior to debridement - for clinic use Wound #5 Left,Lateral Knee: Topical Lidocaine 4% cream applied to wound bed prior to debridement - for clinic use Wound #6 Right,Lateral Malleolus: Topical Lidocaine 4% cream applied to wound bed prior to debridement - for clinic use Skin Barriers/Peri-Wound Care: Wound #1 Sacrum: Skin Prep Wound #2 Midline Back: Skin Prep Wound #3 Right Back: Skin Prep Wound #5 Left,Lateral Knee: Skin Prep Wound #6 Right,Lateral Malleolus: Skin Prep Amparo, Wyatte K. ([id number]) Primary Wound Dressing: Wound #1 Sacrum: Santyl Ointment Medihoney gel - if pt is not able to get the Santyl Wound #2 Midline Back: Other: - Bacitracin Wound #3 Right Back: Other: - Bacitracin Wound #4 Right Knee: Prisma Ag - moisten with saline Wound #5 Left,Lateral Knee: Prisma Ag - moisten with saline Wound #6 Right,Lateral Malleolus: Prisma Ag - moisten with saline Secondary Dressing: Wound #1 Sacrum: Dry Gauze Boardered Foam Dressing Wound #2 Midline Back: Dry Gauze Boardered Foam Dressing Wound #3 Right Back: Dry Gauze Boardered Foam Dressing Wound #5 Left,Lateral Knee: Dry Gauze Boardered Foam Dressing Wound #6 Right,Lateral Malleolus: Dry Gauze Boardered Foam Dressing Wound #4 Right Knee: ABD pad Dry Gauze Conform/Kerlix Dressing Change Frequency: Wound #1 Sacrum: Change dressing every other day. Wound #2 Midline Back: Change dressing every other day. Wound #3 Right Back: Change dressing every other day. Wound #4 Right Knee: Change dressing every other day. Wound #5 Left,Lateral Knee: Change dressing every other day. Wound #6 Right,Lateral Malleolus: Change dressing every other day. Follow-up Appointments: Wound #1 Sacrum: Return Appointment in 1 week. Wound #2 Midline Back: Holben, Muaad K. ([id number]) Return  Appointment in 1 week. Wound #3 Right Back: Return Appointment in 1 week. Wound #4 Right Knee: Return Appointment in 1 week. Wound #5 Left,Lateral Knee: Return Appointment in 1 week. Wound #6 Right,Lateral Malleolus: Return Appointment in 1 week. Off-Loading: Wound #1 Sacrum: Turn and reposition every 2 hours Wound #2 Midline Back: Turn and reposition every 2 hours Wound #3 Right Back: Turn and reposition every 2 hours Wound #4 Right Knee: Turn and reposition every 2 hours Wound #5 Left,Lateral Knee: Turn and reposition every 2 hours Wound #6 Right,Lateral Malleolus: Turn and reposition every 2 hours Additional Orders / Instructions: Wound #1 Sacrum: Increase protein intake. Wound #2 Midline Back: Increase protein intake. Wound #3 Right Back: Increase protein intake. Wound #4 Right Knee: Increase protein intake. Wound #5 Left,Lateral Knee: Increase protein intake. Wound #6 Right,Lateral Malleolus: Increase protein intake. Home Health: Wound #1 Sacrum: [place] for [place] Nurse may visit PRN to address patient s wound care needs. FACE TO FACE ENCOUNTER: MEDICARE and MEDICAID PATIENTS: I certify that this patient is under my care and that I had a face-to-face encounter that meets the physician face-to-face encounter requirements with this patient on this date. The encounter with the patient was in whole or in part for the following MEDICAL CONDITION: (primary reason for [place]) MEDICAL NECESSITY: I certify, that based on my findings, NURSING services are a medically necessary home health service. HOME BOUND STATUS: I certify that my clinical findings support that this patient is homebound (i.e., Due to illness or injury, pt requires aid of supportive devices such as crutches, cane, wheelchairs, walkers, the use of special transportation or the assistance of another person to leave their place  of residence. There is a normal  inability to leave the home and doing so requires considerable and taxing effort. Other absences are for medical reasons / religious services and are infrequent or of short duration when for other reasons). If current dressing causes regression in wound condition, may D/C ordered dressing product/s and apply Normal Saline Moist Dressing daily until next [place] / Other MD appointment. Notify Wound Dubow, Aseem K. ([id number]) [place] of regression in wound condition at [phone]. Please direct any NON-WOUND related issues/requests for orders to patient's Primary Care Physician Wound #2 Midline Back: [place] for [place] Nurse may visit PRN to address patient s wound care needs. FACE TO FACE ENCOUNTER: MEDICARE and MEDICAID PATIENTS: I certify that this patient is under my care and that I had a face-to-face encounter that meets the physician face-to-face encounter requirements with this patient on this date. The encounter with the patient was in whole or in part for the following MEDICAL CONDITION: (primary reason for [place]) MEDICAL NECESSITY: I certify, that based on my findings, NURSING services are a medically necessary home health service. HOME BOUND STATUS: I certify that my clinical findings support that this patient is homebound (i.e., Due to illness or injury, pt requires aid of supportive devices such as crutches, cane, wheelchairs, walkers, the use of special transportation or the assistance of another person to leave their place of residence. There is a normal inability to leave the home and doing so requires considerable and taxing effort. Other absences are for medical reasons / religious services and are infrequent or of short duration when for other reasons). If current dressing causes regression in wound condition, may D/C ordered dressing product/s and apply Normal Saline Moist Dressing daily until next [place] / Other MD appointment. [place] of regression in wound condition at [phone]. Please direct any NON-WOUND related issues/requests for orders to patient's Primary Care Physician Wound #3 Right Back: [place] for [place] Nurse may visit PRN to address patient s wound care needs. FACE TO FACE ENCOUNTER: MEDICARE and MEDICAID PATIENTS: I certify that this patient is under my care and that I had a face-to-face encounter that meets the physician face-to-face encounter requirements with this patient on this date. The encounter with the patient was in whole or in part for the following MEDICAL CONDITION: (primary reason for [place]) MEDICAL NECESSITY: I certify, that based on my findings, NURSING services are a medically necessary home health service. HOME BOUND STATUS: I certify that my clinical findings support that this patient is homebound (i.e., Due to illness or injury, pt requires aid of supportive devices such as crutches, cane, wheelchairs, walkers, the use of special transportation or the assistance of another person to leave their place of residence. There is a normal inability to leave the home and doing so requires considerable and taxing effort. Other absences are for medical reasons / religious services and are infrequent or of short duration when for other reasons). If current dressing causes regression in wound condition, may D/C ordered dressing product/s and apply Normal Saline Moist Dressing daily until next [place] / Other MD appointment. [place] of regression in wound condition at [phone]. Please direct any NON-WOUND related issues/requests for orders to patient's Primary Care Physician Wound #4 Right Knee: [place] for [place] Nurse may visit PRN to address patient s wound care needs. FACE  TO FACE ENCOUNTER: MEDICARE and MEDICAID  PATIENTS: I certify that this patient is under my care and that I had a face-to-face encounter that meets the physician face-to-face encounter requirements with this patient on this date. The encounter with the patient was in whole or in part for the following MEDICAL CONDITION: (primary reason for [place]) MEDICAL NECESSITY: I certify, that based on my findings, NURSING services are a medically necessary home health service. HOME BOUND STATUS: I certify that my clinical findings support that this patient is homebound (i.e., Due to illness or injury, pt requires aid of supportive devices such as crutches, cane, wheelchairs, walkers, the use of special transportation or the assistance of another person to leave their place of residence. There is a normal inability to leave the home and doing so requires considerable and taxing effort. Other absences are for medical reasons / religious services and are infrequent or of short duration when for other reasons). If current dressing causes regression in wound condition, may D/C ordered dressing product/s and apply Normal Saline Moist Dressing daily until next [place] / Other MD appointment. Notify Wound [place]ston, Guadalupe K. ([id number]) [place] of regression in wound condition at [phone]. Please direct any NON-WOUND related issues/requests for orders to patient's Primary Care Physician Wound #5 Left,Lateral Knee: [place] for [place] Nurse may visit PRN to address patient s wound care needs. FACE TO FACE ENCOUNTER: MEDICARE and MEDICAID PATIENTS: I certify that this patient is under my care and that I had a face-to-face encounter that meets the physician face-to-face encounter requirements with this patient on this date. The encounter with the patient was in whole or in part for the following MEDICAL CONDITION: (primary reason for [place]) MEDICAL NECESSITY: I certify, that based on  my findings, NURSING services are a medically necessary home health service. HOME BOUND STATUS: I certify that my clinical findings support that this patient is homebound (i.e., Due to illness or injury, pt requires aid of supportive devices such as crutches, cane, wheelchairs, walkers, the use of special transportation or the assistance of another person to leave their place of residence. There is a normal inability to leave the home and doing so requires considerable and taxing effort. Other absences are for medical reasons / religious services and are infrequent or of short duration when for other reasons). If current dressing causes regression in wound condition, may D/C ordered dressing product/s and apply Normal Saline Moist Dressing daily until next [place] / Other MD appointment. [place] of regression in wound condition at [phone]. Please direct any NON-WOUND related issues/requests for orders to patient's Primary Care Physician Wound #6 Right,Lateral Malleolus: [place] for [place] Nurse may visit PRN to address patient s wound care needs. FACE TO FACE ENCOUNTER: MEDICARE and MEDICAID PATIENTS: I certify that this patient is under my care and that I had a face-to-face encounter that meets the physician face-to-face encounter requirements with this patient on this date. The encounter with the patient was in whole or in part for the following MEDICAL CONDITION: (primary reason for [place]) MEDICAL NECESSITY: I certify, that based on my findings, NURSING services are a medically necessary home health service. HOME BOUND STATUS: I certify that my clinical findings support that this patient is homebound (i.e., Due to illness or injury, pt requires aid of supportive devices such as crutches, cane, wheelchairs, walkers, the use of special transportation or the assistance  of another person to leave their place of  residence. There is a normal inability to leave the home and doing so requires considerable and taxing effort. Other absences are for medical reasons / religious services and are infrequent or of short duration when for other reasons). If current dressing causes regression in wound condition, may D/C ordered dressing product/s and apply Normal Saline Moist Dressing daily until next [place] / Other MD appointment. [place] of regression in wound condition at [phone]. Please direct any NON-WOUND related issues/requests for orders to patient's Primary Care Physician Medications-please add to medication list.: Wound #1 Sacrum: Other: - Vitamin C, Zinc, MVI Wound #2 Midline Back: Other: - Vitamin C, Zinc, MVI Wound #3 Right Back: Other: - Vitamin C, Zinc, MVI Wound #4 Right Knee: Other: - Vitamin C, Zinc, MVI Wound #5 Left,Lateral Knee: Other: - Vitamin C, Zinc, MVI Wound #6 Right,Lateral Malleolus: Other: - Vitamin C, Zinc, MVI Radiology ordered were: X-ray, coccyx - and sacrum Popowski, Saxon K. ([id number]) #1 the major area here is a deep tissue injury over the lower sacrum/coccyx and surrounding buttock's. This is covered with a black eschar right greater than left. Some surrounding soft tissue erythema but no overt tenderness. He is on doxycycline and I did not change this. He will need an x-ray of the underlying bone and soft tissue. #2 agent of choice here would probably be Santyl, without insurance coverage she already told me this would be unaffordable. Med a honey would be the agents of choice. In the next week or 2 everything being equal the area will probably be crosshatched and more deeply debrided. At this point this is an unstageable area and the exact depth of the tissue injury here is uncertain #3 I have counseled the patient that he will need to be rigorous in the offloading of this area. He will need to sit and take the weight on his  upper thighs and ischial tuberosities rather than slouching back over the lower sacral area. I went over this with the patient and his daughter. #4 he might be eligible for a pressure relief surface for his bed #5 the rest of his wounds even the ones that I didn't debridement on are superficial after. We use Prisma to the ones on his lower legs and triple antibiotic to the areas over his scapular area. I don't expect there to be any problem in healing these. Electronic Signature(s) Signed: 06/28/2016 4:49:17 PM By: Linton Ham MD Entered By: Linton Ham on 06/28/2016 12:20:00 Lynds, [name] ([id number]) -------------------------------------------------------------------------------- ROS/PFSH Details Patient Name: [name] Date of Service: 06/28/2016 10:30 AM Medical Record Patient Account Number: [id number] Number: Treating RN: Ahmed Prima November 26, 1940 (75 y.o. Other Clinician: Date of Birth/Sex: Male) Treating Assia Meanor Primary Care Provider: Johny Drilling Provider/Extender: G Referring Provider: Nettie Elm in Treatment: 0 Information Obtained From Patient Wound History Do you currently have one or more open woundso Yes How many open wounds do you currently haveo 6 Approximately how long have you had your woundso 2 weeks How have you been treating your wound(s) until nowo bacitracin Has your wound(s) ever healed and then re-openedo No Have you had any lab work done in the past montho No Have you tested positive for an antibiotic resistant organism (MRSA, VRE)o No Have you tested positive for osteomyelitis (bone infection)o No Have you had any tests for circulation on your legso No Have you had other problems associated with your woundso Swelling  Immunizations Pneumococcal Vaccine: Received Pneumococcal Vaccination: No Family and Social History Cancer: No; Diabetes: Yes - Siblings; Heart Disease: No; Hereditary Spherocytosis: No;  Hypertension: No; Kidney Disease: No; Lung Disease: No; Seizures: No; Stroke: No; Thyroid Problems: No; Tuberculosis: No; Former smoker - quit 60 yrs ago; Marital Status - Widowed; Alcohol Use: Never; Drug Use: No History; Caffeine Use: Never; Financial Concerns: No; Food, Clothing or Shelter Needs: No; Support System Lacking: No; Transportation Concerns: No; Advanced Directives: No; Patient does not want information on Advanced Directives; Do not resuscitate: No; Living Will: No; Medical Power of Attorney: No Electronic Signature(s) Signed: 06/28/2016 4:41:59 PM By: Alric Quan Signed: 06/28/2016 4:49:17 PM By: Linton Ham MD Entered By: Alric Quan on 06/28/2016 10:33:11 [place], [name] ([id number]) -------------------------------------------------------------------------------- SuperBill Details Patient Name: [name] Date of Service: 06/28/2016 Medical Record Patient Account Number: [id number] Number: Treating RN: Ahmed Prima 10-26-1940 (75 y.o. Other Clinician: Date of Birth/Sex: Male) Treating Japhet Morgenthaler Primary Care Provider: Johny Drilling Provider/Extender: G Referring Provider: Nettie Elm in Treatment: 0 Diagnosis Coding ICD-10 Codes Code Description L89.150 Pressure ulcer of sacral region, unstageable S91.011D Laceration without foreign body, right ankle, subsequent encounter Laceration without foreign body of unspecified back wall of thorax without penetration into S21.219D thoracic cavity, subsequent encounter E11.622 Type 2 diabetes mellitus with other skin ulcer Facility Procedures CPT4: Description Modifier Quantity Code [id number] - WOUND CARE VISIT-LEV 3 EST PT 1 CPT4: [id number] 11042 - DEB SUBQ TISSUE 20 SQ CM/< 1 ICD-10 Description Diagnosis S91.011D Laceration without foreign body, right ankle, subsequent encounter S21.219D Laceration without foreign body of unspecified back wall of thorax without penetration   into thoracic cavity, subsequent encounter Physician Procedures CPT4: Description Modifier Quantity Code [id number] 99204 - WC PHYS LEVEL 4 - NEW PT 25 1 ICD-10 Description Diagnosis L89.150 Pressure ulcer of sacral region, unstageable CPT4: [id number] 11042 - WC PHYS SUBQ TISS 20 SQ CM 1 ICD-10 Description Diagnosis S91.011D Laceration without foreign body, right ankle, subsequent encounter S21.219D Laceration without foreign body of unspecified back wall of thorax without penetration  into thoracic cavity, subsequent encounter SAADIQ, POCHE ([id number]) Electronic Signature(s) Signed: 06/28/2016 4:41:59 PM By: Alric Quan Signed: 06/28/2016 4:49:17 PM By: Linton Ham MD Entered By: Alric Quan on 06/28/2016 13:43:50

## 2016-06-29 NOTE — Progress Notes (Signed)
[name], [name] ([id number]) Visit Report for 06/28/2016 Allergy List Details Patient Name: [name], [name] Date of Service: 06/28/2016 10:30 AM Medical Record Number: [id number] Patient Account Number: [id number] Date of Birth/Sex: Sep 20, 1940 (75 y.o. Male) Treating RN: Ahmed Prima Primary Care Jakeisha Stricker: [name] Other Clinician: Referring Pamila Mendibles: [name] Treating Breandan People/Extender: Ricard Dillon Weeks in Treatment: 0 Allergies Active Allergies NKDA Allergy Notes Electronic Signature(s) Signed: 06/28/2016 4:41:59 PM By: Alric Quan Entered By: Alric Quan on 06/28/2016 10:28:24 Rodeheaver, [name] ([id number]) -------------------------------------------------------------------------------- [place] Details Patient Name: [name] Date of Service: 06/28/2016 10:30 AM Medical Record Number: [id number] Patient Account Number: [id number] Date of Birth/Sex: Jun 05, 1940 (76 y.o. Male) Treating RN: Ahmed Prima Primary Care Lucielle Vokes: [name] Other Clinician: Referring Gerda Yin: [name] Treating Ethelreda Sukhu/Extender: Tito Dine in Treatment: 0 Visit Information Patient Arrived: Lyndel Pleasure Time: 10:20 Accompanied By: daughter Transfer Assistance: EasyPivot Patient Lift Patient Identification Verified: No Secondary Verification Process No Completed: Patient Requires Transmission- No Based Precautions: Patient Has Alerts: Yes Patient Alerts: DM II Electronic Signature(s) Signed: 06/28/2016 4:41:59 PM By: Alric Quan Entered By: Alric Quan on 06/28/2016 10:27:14 Chauvin, [name] ([id number]) -------------------------------------------------------------------------------- Clinic Level of Care Assessment Details Patient Name: [name] Date of Service: 06/28/2016 10:30 AM Medical Record Number: [id number] Patient Account Number: [id number] Date of Birth/Sex: 14-Feb-1940 (76 y.o.  Male) Treating RN: Ahmed Prima Primary Care Eliceo Gladu: [name] Other Clinician: Referring Sahan Pen: [name] Treating Hosanna Betley/Extender: Tito Dine in Treatment: 0 Clinic Level of Care Assessment Items TOOL 1 Quantity Score X - Use when EandM and Procedure is performed on INITIAL visit 1 0 ASSESSMENTS - Nursing Assessment / Reassessment X - General Physical Exam (combine w/ comprehensive assessment (listed just 1 20 below) when performed on new pt. evals) X - Comprehensive Assessment (HX, ROS, Risk Assessments, Wounds Hx, etc.) 1 25 ASSESSMENTS - Wound and Skin Assessment / Reassessment []  - Dermatologic / Skin Assessment (not related to wound area) 0 ASSESSMENTS - Ostomy and/or Continence Assessment and Care []  - Incontinence Assessment and Management 0 []  - Ostomy Care Assessment and Management (repouching, etc.) 0 PROCESS - Coordination of Care []  - Simple Patient / Family Education for ongoing care 0 X - Complex (extensive) Patient / Family Education for ongoing care 1 20 X - Staff obtains Programmer, systems, Records, Test Results / Process Orders 1 10 X - Staff telephones HHA, Nursing Homes / Clarify orders / etc 1 10 []  - Routine Transfer to another Facility (non-emergent condition) 0 []  - Routine Hospital Admission (non-emergent condition) 0 X - New Admissions / Biomedical engineer / Ordering NPWT, Apligraf, etc. 1 15 []  - Emergency Hospital Admission (emergent condition) 0 PROCESS - Special Needs []  - Pediatric / Minor Patient Management 0 []  - Isolation Patient Management 0 Taflinger, Charles K. ([id number]) []  - Hearing / Language / Visual special needs 0 []  - Assessment of Community assistance (transportation, D/C planning, etc.) 0 []  - Additional assistance / Altered mentation 0 []  - Support Surface(s) Assessment (bed, cushion, seat, etc.) 0 INTERVENTIONS - Miscellaneous []  - External ear exam 0 []  - Patient Transfer (multiple staff / Civil Service fast streamer /  Similar devices) 0 []  - Simple Staple / Suture removal (25 or less) 0 []  - Complex Staple / Suture removal (26 or more) 0 []  - Hypo/Hyperglycemic Management (do not check if billed separately) 0 []  - Ankle / Brachial Index (ABI) - do not check if billed separately 0 Has the patient been seen at  the hospital within the last three years: Yes Total Score: 100 Level Of Care: New/Established - Level 3 Electronic Signature(s) Signed: 06/28/2016 4:41:59 PM By: Alric Quan Entered By: Alric Quan on 06/28/2016 13:43:41 Alegria, [name] ([id number]) -------------------------------------------------------------------------------- Encounter Discharge Information Details Patient Name: [name] Date of Service: 06/28/2016 10:30 AM Medical Record Number: [id number] Patient Account Number: [id number] Date of Birth/Sex: 1940/11/05 (76 y.o. Male) Treating RN: Ahmed Prima Primary Care Harol Shabazz: [name] Other Clinician: Referring Kalin Kyler: [name] Treating Esraa Seres/Extender: Tito Dine in Treatment: 0 Encounter Discharge Information Items Discharge Pain Level: 0 Discharge Condition: Stable Ambulatory Status: Cane Discharge Destination: Home Transportation: Private Auto Accompanied By: daughter Schedule Follow-up Appointment: Yes Medication Reconciliation completed No and provided to Patient/Care Jule Schlabach: Provided on Clinical Summary of Care: 06/28/2016 Form Type Recipient Paper Patient LM Electronic Signature(s) Signed: 06/28/2016 4:41:59 PM By: Alric Quan Previous Signature: 06/28/2016 12:03:23 PM Version By: Ruthine Dose Entered By: Alric Quan on 06/28/2016 13:09:02 [place], [name] ([id number]) -------------------------------------------------------------------------------- Lower Extremity Assessment Details Patient Name: [name] Date of Service: 06/28/2016 10:30 AM Medical Record Number: [id number] Patient Account  Number: [id number] Date of Birth/Sex: [birth year] (76 y.o. Male) Treating RN: Ahmed Prima Primary Care Israel Werts: [name] Other Clinician: Referring Taccara Bushnell: [name] Treating Besnik Febus/Extender: Ricard Dillon Weeks in Treatment: 0 Electronic Signature(s) Signed: 06/28/2016 4:41:59 PM By: Alric Quan Entered By: Alric Quan on 06/28/2016 10:36:02 [place], [name] ([id number]) -------------------------------------------------------------------------------- Multi Wound Chart Details Patient Name: [name] Date of Service: 06/28/2016 10:30 AM Medical Record Number: [id number] Patient Account Number: [id number] Date of Birth/Sex: January 07, 1940 (76 y.o. Male) Treating RN: Ahmed Prima Primary Care Danise Dehne: [name] Other Clinician: Referring Clydene Burack: [name] Treating Riot Waterworth/Extender: Ricard Dillon Weeks in Treatment: 0 Vital Signs Height(in): 69 Pulse(bpm): 64 Weight(lbs): 147.5 Blood Pressure 91/46 (mmHg): Body Mass Index(BMI): 22 Temperature(F): 97.5 Respiratory Rate 16 (breaths/min): Photos: [1:No Photos] [2:No Photos] [3:No Photos] Wound Location: [1:Sacrum] [2:Back - Midline] [3:Right Back] Wounding Event: [1:Pressure Injury] [2:Trauma] [3:Trauma] Primary Etiology: [1:Pressure Ulcer] [2:Trauma, Other] [3:Trauma, Other] Date Acquired: [1:06/14/2016] [2:06/14/2016] [3:06/14/2016] Weeks of Treatment: [1:0] [2:0] [3:0] Wound Status: [1:Open] [2:Open] [3:Open] Clustered Wound: [1:No] [2:No] [3:Yes] Clustered Quantity: [1:N/A] [2:N/A] [3:3] Measurements L x W x D 5.2x9.3x0.2 [2:1.5x1.5x0.1] [3:0.4x2.3x0.1] (cm) Area (cm) : [1:37.982] [2:1.767] [3:0.723] Volume (cm) : [1:7.596] [2:0.177] [3:0.072] Classification: [1:Category/Stage II] [2:Partial Thickness] [3:Partial Thickness] Exudate Amount: [1:Large] [2:Large] [3:Large] Exudate Type: [1:Serosanguineous] [2:Serosanguineous] [3:Serosanguineous] Exudate Color: [1:red,  brown] [2:red, brown] [3:red, brown] Wound Margin: [1:Distinct, outline attached] [2:Flat and Intact] [3:Flat and Intact] Granulation Amount: [1:None Present (0%)] [2:None Present (0%)] [3:None Present (0%)] Necrotic Amount: [1:Large (67-100%)] [2:Large (67-100%)] [3:Large (67-100%)] Necrotic Tissue: [1:Eschar, Adherent Slough] [2:Adherent Slough] [3:Eschar, Adherent Slough] Epithelialization: [1:None] [2:None] [3:None] Debridement: [1:N/A] [2:Debridement (54650- 35465)] [3:Debridement (68127- 51700)] Pre-procedure [1:N/A] [2:11:22] [3:11:22] Verification/Time Out Taken: Pain Control: [1:N/A] [2:Lidocaine 4% Topical Solution] [3:Lidocaine 4% Topical Solution] Tissue Debrided: N/A Fibrin/Slough, Exudates, Fibrin/Slough, Exudates, Subcutaneous Subcutaneous Level: N/A Skin/Subcutaneous Skin/Subcutaneous Tissue Tissue Debridement Area (sq N/A 2.25 0.92 cm): Instrument: N/A Curette Curette Bleeding: N/A Minimum Minimum Hemostasis Achieved: N/A Pressure Pressure Procedural Pain: N/A 0 0 Post Procedural Pain: N/A 0 0 Debridement Treatment N/A Procedure was tolerated Procedure was tolerated Response: well well Post Debridement N/A 1.5x1.5x0.1 0.4x2.3x0.1 Measurements L x W x D (cm) Post Debridement N/A 0.177 0.072 Volume: (cm) Periwound Skin Texture: No Abnormalities Noted No Abnormalities Noted No Abnormalities Noted Periwound Skin Dry/Scaly: Yes No Abnormalities Noted No Abnormalities Noted Moisture: Periwound Skin Color:  Erythema: Yes No Abnormalities Noted No Abnormalities Noted Erythema Location: Circumferential N/A N/A Temperature: No Abnormality No Abnormality No Abnormality Tenderness on Yes Yes Yes Palpation: Wound Preparation: Ulcer Cleansing: Ulcer Cleansing: Ulcer Cleansing: Rinsed/Irrigated with Rinsed/Irrigated with Rinsed/Irrigated with Saline Saline Saline Topical Anesthetic Topical Anesthetic Topical Anesthetic Applied: Other: lidocaine Applied: Other: lidocaine  Applied: Other: lidocaine 4% 4% 4% Procedures Performed: N/A Debridement Debridement Wound Number: 6 N/A N/A Photos: No Photos N/A N/A Wound Location: Right Malleolus - Lateral N/A N/A Wounding Event: Trauma N/A N/A Primary Etiology: Trauma, Other N/A N/A Date Acquired: 06/14/2016 N/A N/A Weeks of Treatment: 0 N/A N/A Wound Status: Open N/A N/A Clustered Wound: No N/A N/A Clustered Quantity: N/A N/A N/A Measurements L x W x D 0.7x0.4x0.1 N/A N/A (cm) Area (cm) : 0.22 N/A N/A Volume (cm) : 0.022 N/A N/A Druck, Charles K. ([id number]) Classification: Partial Thickness N/A N/A Exudate Amount: Large N/A N/A Exudate Type: Serosanguineous N/A N/A Exudate Color: red, brown N/A N/A Wound Margin: Flat and Intact N/A N/A Granulation Amount: None Present (0%) N/A N/A Necrotic Amount: Large (67-100%) N/A N/A Necrotic Tissue: Eschar N/A N/A Epithelialization: None N/A N/A Debridement: Debridement (55732- N/A N/A 11047) Pre-procedure 11:22 N/A N/A Verification/Time Out Taken: Pain Control: Lidocaine 4% Topical N/A N/A Solution Tissue Debrided: Fibrin/Slough, Exudates, N/A N/A Subcutaneous Level: Skin/Subcutaneous N/A N/A Tissue Debridement Area (sq 0.28 N/A N/A cm): Instrument: Curette N/A N/A Bleeding: Minimum N/A N/A Hemostasis Achieved: Pressure N/A N/A Procedural Pain: 0 N/A N/A Post Procedural Pain: 0 N/A N/A Debridement Treatment Procedure was tolerated N/A N/A Response: well Post Debridement 0.7x0.4x0 N/A N/A Measurements L x W x D (cm) Post Debridement 0 N/A N/A Volume: (cm) Periwound Skin Texture: No Abnormalities Noted N/A N/A Periwound Skin No Abnormalities Noted N/A N/A Moisture: Periwound Skin Color: No Abnormalities Noted N/A N/A Erythema Location: N/A N/A N/A Temperature: No Abnormality N/A N/A Tenderness on Yes N/A N/A Palpation: Wound Preparation: Ulcer Cleansing: N/A N/A Rinsed/Irrigated with Saline Topical Anesthetic Applied: Other:  lidocaine 4% Kitamura, Charles K. ([id number]) Procedures Performed: Debridement N/A N/A Treatment Notes Wound #1 (Sacrum) 1. Cleansed with: Clean wound with Normal Saline 2. Anesthetic Topical Lidocaine 4% cream to wound bed prior to debridement 3. Peri-wound Care: Skin Prep 4. Dressing Applied: Santyl Ointment 5. Secondary Dressing Applied Bordered Foam Dressing Dry Gauze Wound #2 (Midline Back) 1. Cleansed with: Clean wound with Normal Saline 2. Anesthetic Topical Lidocaine 4% cream to wound bed prior to debridement 3. Peri-wound Care: Skin Prep 4. Dressing Applied: Other dressing (specify in notes) 5. Secondary Dressing Applied Bordered Foam Dressing Dry Gauze Notes Bacitracin Wound #3 (Right Back) 1. Cleansed with: Clean wound with Normal Saline 2. Anesthetic Topical Lidocaine 4% cream to wound bed prior to debridement 3. Peri-wound Care: Skin Prep 4. Dressing Applied: Other dressing (specify in notes) 5. Secondary Dressing Applied Bordered Foam Dressing Dry Gauze Notes Bacitracin Gelder, Charles K. ([id number]) Wound #6 (Right, Lateral Malleolus) 1. Cleansed with: Clean wound with Normal Saline 2. Anesthetic Topical Lidocaine 4% cream to wound bed prior to debridement 3. Peri-wound Care: Skin Prep 4. Dressing Applied: Prisma Ag 5. Secondary Dressing Applied Bordered Foam Dressing Dry Gauze Electronic Signature(s) Signed: 06/28/2016 4:41:59 PM By: Alric Quan Entered By: Alric Quan on 06/28/2016 13:10:44 [place], [name] ([id number]) -------------------------------------------------------------------------------- Multi-Disciplinary Care Plan Details Patient Name: [name] Date of Service: 06/28/2016 10:30 AM Medical Record Number: [id number] Patient Account Number: [id number] Date of Birth/Sex: 11-05-1940 (77 y.o. Male) Treating RN: Ahmed Prima Primary Care  Toivo Bordon: [name] Other Clinician: Referring Wissam Resor: [name] Treating Paolina Karwowski/Extender: Tito Dine in Treatment: 0 Active Inactive ` Abuse / Safety / Falls / Self Care Management Nursing Diagnoses: Potential for falls Goals: Patient will not experience any injury related to falls Date Initiated: 06/28/2016 Target Resolution Date: 10/08/2016 Goal Status: Active Interventions: Assess Activities of Daily Living upon admission and as needed Assess: immobility, friction, shearing, incontinence upon admission and as needed Assess impairment of mobility on admission and as needed per policy Notes: ` Nutrition Nursing Diagnoses: Imbalanced nutrition Impaired glucose control: actual or potential Potential for alteratiion in Nutrition/Potential for imbalanced nutrition Goals: Patient/caregiver agrees to and verbalizes understanding of need to use nutritional supplements and/or vitamins as prescribed Date Initiated: 06/28/2016 Target Resolution Date: 09/10/2016 Goal Status: Active Patient/caregiver verbalizes understanding of need to maintain therapeutic glucose control per primary care physician Date Initiated: 06/28/2016 Target Resolution Date: 09/10/2016 Goal Status: Active Interventions: MCADAMSMarlyn, Rabine ([id number]) Assess patient nutrition upon admission and as needed per policy Notes: ` Orientation to the Wound Care Program Nursing Diagnoses: Knowledge deficit related to the wound healing center program Goals: Patient/caregiver will verbalize understanding of the [facility] Date Initiated: 06/28/2016 Target Resolution Date: 07/09/2016 Goal Status: Active Interventions: Provide education on orientation to the wound center Notes: ` Pain, Acute or Chronic Nursing Diagnoses: Pain, acute or chronic: actual or potential Potential alteration in comfort, pain Goals: Patient/caregiver will verbalize adequate pain control between visits Date Initiated: 06/28/2016 Target Resolution Date: 10/08/2016 Goal  Status: Active Interventions: Assess comfort goal upon admission Complete pain assessment as per visit requirements Notes: ` Pressure Nursing Diagnoses: Knowledge deficit related to causes and risk factors for pressure ulcer development Knowledge deficit related to management of pressures ulcers Potential for impaired tissue integrity related to pressure, friction, moisture, and shear Goals: Patient will remain free from development of additional pressure ulcers Badia, [name] ([id number]) Date Initiated: 06/28/2016 Target Resolution Date: 10/08/2016 Goal Status: Active Interventions: Assess: immobility, friction, shearing, incontinence upon admission and as needed Assess offloading mechanisms upon admission and as needed Notes: ` Wound/Skin Impairment Nursing Diagnoses: Impaired tissue integrity Knowledge deficit related to ulceration/compromised skin integrity Goals: Ulcer/skin breakdown will have a volume reduction of 80% by week 12 Date Initiated: 06/28/2016 Target Resolution Date: 10/01/2016 Goal Status: Active Interventions: Assess patient/caregiver ability to perform ulcer/skin care regimen upon admission and as needed Assess ulceration(s) every visit Notes: Electronic Signature(s) Signed: 06/28/2016 4:41:59 PM By: Alric Quan Entered By: Alric Quan on 06/28/2016 13:10:25 [place], [name] ([id number]) -------------------------------------------------------------------------------- Pain Assessment Details Patient Name: [name] Date of Service: 06/28/2016 10:30 AM Medical Record Number: [id number] Patient Account Number: [id number] Date of Birth/Sex: 04/19/1940 (76 y.o. Male) Treating RN: Ahmed Prima Primary Care Yvana Samonte: [name] Other Clinician: Referring Jamina Macbeth: [name] Treating Angee Gupton/Extender: Ricard Dillon Weeks in Treatment: 0 Active Problems Location of Pain Severity and Description of Pain Patient Has Paino  Yes Site Locations Pain Location: Pain in Ulcers With Dressing Change: Yes Rate the pain. Current Pain Level: 5 Character of Pain Describe the Pain: Tender, Throbbing Pain Management and Medication Current Pain Management: Electronic Signature(s) Signed: 06/28/2016 4:41:59 PM By: Alric Quan Entered By: Alric Quan on 06/28/2016 10:27:17 [place]man, [name] ([id number]) -------------------------------------------------------------------------------- Patient/Caregiver Education Details Patient Name: [name] Date of Service: 06/28/2016 10:30 AM Medical Record Patient Account Number: [id number] Number: Treating RN: Ahmed Prima 10-12-1940 (75 y.o. Other Clinician: Date of Birth/Gender: Male) Treating Dellia Nims Chapman Primary Care Physician:  [name] Physician/Extender: G Referring Physician: Nettie Elm in Treatment: 0 Education Assessment Education Provided To: Patient and Caregiver Education Topics Provided Pressure: Handouts: Pressure Ulcers: Care and Offloading Methods: Explain/Verbal Responses: State content correctly Welcome To [facility] [place]: Handouts: Welcome To [facility] [place] Methods: Explain/Verbal Responses: State content correctly Wound/Skin Impairment: Handouts: Other: change dressing as ordered Methods: Demonstration, Explain/Verbal Responses: State content correctly Electronic Signature(s) Signed: 06/28/2016 4:41:59 PM By: Alric Quan Entered By: Alric Quan on 06/28/2016 13:16:02 [place]all, [name] ([id number]) -------------------------------------------------------------------------------- Wound Assessment Details Patient Name: [name] Date of Service: 06/28/2016 10:30 AM Medical Record Number: [id number] Patient Account Number: [id number] Date of Birth/Sex: [birth year] (76 y.o. Male) Treating RN: Ahmed Prima Primary Care Kiyonna Tortorelli: [name] Other Clinician: Referring  Kimbree Casanas: [name] Treating Jaidah Lomax/Extender: Ricard Dillon Weeks in Treatment: 0 Wound Status Wound Number: 1 Primary Etiology: Pressure Ulcer Wound Location: Sacrum Wound Status: Open Wounding Event: Pressure Injury Date Acquired: 06/14/2016 Weeks Of Treatment: 0 Clustered Wound: No Photos Photo Uploaded By: Alric Quan on 06/28/2016 15:51:48 Wound Measurements Length: (cm) 5.2 Width: (cm) 9.3 Depth: (cm) 0.2 Area: (cm) 37.982 Volume: (cm) 7.596 % Reduction in Area: % Reduction in Volume: Epithelialization: None Tunneling: No Undermining: No Wound Description Classification: Category/Stage II Wound Margin: Distinct, outline attached Exudate Amount: Large Exudate Type: Serosanguineous Exudate Color: red, brown Foul Odor After Cleansing: No Slough/Fibrino Yes Wound Bed Granulation Amount: None Present (0%) Necrotic Amount: Large (67-100%) Necrotic Quality: Eschar, Adherent Slough Periwound Skin Texture Bilek, Buck K. ([id number]) Texture Color No Abnormalities Noted: No No Abnormalities Noted: No Erythema: Yes Moisture Erythema Location: Circumferential No Abnormalities Noted: No Dry / Scaly: Yes Temperature / Pain Temperature: No Abnormality Tenderness on Palpation: Yes Wound Preparation Ulcer Cleansing: Rinsed/Irrigated with Saline Topical Anesthetic Applied: Other: lidocaine 4%, Treatment Notes Wound #1 (Sacrum) 1. Cleansed with: Clean wound with Normal Saline 2. Anesthetic Topical Lidocaine 4% cream to wound bed prior to debridement 3. Peri-wound Care: Skin Prep 4. Dressing Applied: Santyl Ointment 5. Secondary Dressing Applied Bordered Foam Dressing Dry Gauze Electronic Signature(s) Signed: 06/28/2016 4:41:59 PM By: Alric Quan Entered By: Alric Quan on 06/28/2016 11:07:22 Bergey, [name] ([id number]) -------------------------------------------------------------------------------- Wound Assessment  Details Patient Name: [name] Date of Service: 06/28/2016 10:30 AM Medical Record Number: [id number] Patient Account Number: [id number] Date of Birth/Sex: 23-Jun-1940 (76 y.o. Male) Treating RN: Carolyne Fiscal, [name] Primary Care Tyyonna Soucy: [name] Other Clinician: Referring Rodarius Kichline: [name] Treating Karanveer Ramakrishnan/Extender: Ricard Dillon Weeks in Treatment: 0 Wound Status Wound Number: 2 Primary Etiology: Trauma, Other Wound Location: Back - Midline Wound Status: Open Wounding Event: Trauma Date Acquired: 06/14/2016 Weeks Of Treatment: 0 Clustered Wound: No Photos Photo Uploaded By: Alric Quan on 06/28/2016 15:51:48 Wound Measurements Length: (cm) 1.5 Width: (cm) 1.5 Depth: (cm) 0.1 Area: (cm) 1.767 Volume: (cm) 0.177 % Reduction in Area: % Reduction in Volume: Epithelialization: None Tunneling: No Undermining: No Wound Description Classification: Partial Thickness Wound Margin: Flat and Intact Exudate Amount: Large Exudate Type: Serosanguineous Exudate Color: red, brown Foul Odor After Cleansing: No Slough/Fibrino Yes Wound Bed Granulation Amount: None Present (0%) Necrotic Amount: Large (67-100%) Necrotic Quality: Adherent Slough Periwound Skin Texture Coufal, Charles K. ([id number]) Texture Color No Abnormalities Noted: No No Abnormalities Noted: No Moisture Temperature / Pain No Abnormalities Noted: No Temperature: No Abnormality Tenderness on Palpation: Yes Wound Preparation Ulcer Cleansing: Rinsed/Irrigated with Saline Topical Anesthetic Applied: Other: lidocaine 4%, Treatment Notes Wound #2 (Midline Back) 1. Cleansed with: Clean wound with Normal  Saline 2. Anesthetic Topical Lidocaine 4% cream to wound bed prior to debridement 3. Peri-wound Care: Skin Prep 4. Dressing Applied: Other dressing (specify in notes) 5. Secondary Dressing Applied Bordered Foam Dressing Dry Gauze Notes Bacitracin Electronic  Signature(s) Signed: 06/28/2016 4:41:59 PM By: Alric Quan Entered By: Alric Quan on 06/28/2016 11:09:11 [place], [name] ([id number]) -------------------------------------------------------------------------------- Wound Assessment Details Patient Name: [name] Date of Service: 06/28/2016 10:30 AM Medical Record Number: [id number] Patient Account Number: [id number] Date of Birth/Sex: 21-Dec-1940 (76 y.o. Male) Treating RN: Carolyne Fiscal, [name] Primary Care Jeptha Hinnenkamp: [name] Other Clinician: Referring Dillyn Joaquin: [name] Treating Annalynne Ibanez/Extender: Ricard Dillon Weeks in Treatment: 0 Wound Status Wound Number: 3 Primary Etiology: Trauma, Other Wound Location: Right Back Wound Status: Open Wounding Event: Trauma Date Acquired: 06/14/2016 Weeks Of Treatment: 0 Clustered Wound: Yes Photos Photo Uploaded By: Alric Quan on 06/28/2016 15:52:48 Wound Measurements Length: (cm) 0.4 Width: (cm) 2.3 Depth: (cm) 0.1 Clustered Quantity: 3 Area: (cm) 0.723 Volume: (cm) 0.072 % Reduction in Area: % Reduction in Volume: Epithelialization: None Tunneling: No Undermining: No Wound Description Classification: Partial Thickness Wound Margin: Flat and Intact Exudate Amount: Large Exudate Type: Serosanguineous Exudate Color: red, brown Foul Odor After Cleansing: No Slough/Fibrino Yes Wound Bed Granulation Amount: None Present (0%) Necrotic Amount: Large (67-100%) Necrotic Quality: Eschar, Adherent [address] Charles K. ([id number]) Periwound Skin Texture Texture Color No Abnormalities Noted: No No Abnormalities Noted: No Moisture Temperature / Pain No Abnormalities Noted: No Temperature: No Abnormality Tenderness on Palpation: Yes Wound Preparation Ulcer Cleansing: Rinsed/Irrigated with Saline Topical Anesthetic Applied: Other: lidocaine 4%, Treatment Notes Wound #3 (Right Back) 1. Cleansed with: Clean wound with Normal Saline 2.  Anesthetic Topical Lidocaine 4% cream to wound bed prior to debridement 3. Peri-wound Care: Skin Prep 4. Dressing Applied: Other dressing (specify in notes) 5. Secondary Dressing Applied Bordered Foam Dressing Dry Gauze Notes Bacitracin Electronic Signature(s) Signed: 06/28/2016 4:41:59 PM By: Alric Quan Entered By: Alric Quan on 06/28/2016 11:11:30 [place], [name] ([id number]) -------------------------------------------------------------------------------- Wound Assessment Details Patient Name: [name] Date of Service: 06/28/2016 10:30 AM Medical Record Number: [id number] Patient Account Number: [id number] Date of Birth/Sex: [birth year] (76 y.o. Male) Treating RN: Carolyne Fiscal, [name] Primary Care [place]: [name] Other Clinician: Referring [name]: [name] Treating Jericho Cieslik/Extender: Ricard Dillon Weeks in Treatment: 0 Wound Status Wound Number: 6 Primary Etiology: Trauma, Other Wound Location: Right Malleolus - Lateral Wound Status: Open Wounding Event: Trauma Date Acquired: 06/14/2016 Weeks Of Treatment: 0 Clustered Wound: No Photos Photo Uploaded By: Alric Quan on 06/28/2016 15:52:49 Wound Measurements Length: (cm) 0.7 Width: (cm) 0.4 Depth: (cm) 0.1 Area: (cm) 0.22 Volume: (cm) 0.022 % Reduction in Area: % Reduction in Volume: Epithelialization: None Undermining: No Wound Description Classification: Partial Thickness Wound Margin: Flat and Intact Exudate Amount: Large Exudate Type: Serosanguineous Exudate Color: red, brown Foul Odor After Cleansing: No Slough/Fibrino Yes Wound Bed Granulation Amount: None Present (0%) Necrotic Amount: Large (67-100%) Necrotic Quality: Eschar Periwound Skin Texture Desir, Charles K. ([id number]) Texture Color No Abnormalities Noted: No No Abnormalities Noted: No Moisture Temperature / Pain No Abnormalities Noted: No Temperature: No Abnormality Tenderness on Palpation:  Yes Wound Preparation Ulcer Cleansing: Rinsed/Irrigated with Saline Topical Anesthetic Applied: Other: lidocaine 4%, Treatment Notes Wound #6 (Right, Lateral Malleolus) 1. Cleansed with: Clean wound with Normal Saline 2. Anesthetic Topical Lidocaine 4% cream to wound bed prior to debridement 3. Peri-wound Care: Skin Prep 4. Dressing Applied: Prisma Ag 5. Secondary Dressing Applied Bordered Foam Dressing Dry Gauze Electronic  Signature(s) Signed: 06/28/2016 4:41:59 PM By: Alric Quan Entered By: Alric Quan on 06/28/2016 11:15:16 [place], [name] ([id number]) -------------------------------------------------------------------------------- [place] Details Patient Name: [name] Date of Service: 06/28/2016 10:30 AM Medical Record Number: [id number] Patient Account Number: [id number] Date of Birth/Sex: 1940-09-14 (76 y.o. Male) Treating RN: Ahmed Prima Primary Care Chaise Mahabir: [name] Other Clinician: Referring Netta Fodge: [name] Treating Xiong Haidar/Extender: Tito Dine in Treatment: 0 Vital Signs Time Taken: 10:27 Temperature (F): 97.5 Height (in): 69 Pulse (bpm): 64 Source: Stated Respiratory Rate (breaths/min): 16 Weight (lbs): 147.5 Blood Pressure (mmHg): 91/46 Source: Measured Reference Range: 80 - 120 mg / dl Body Mass Index (BMI): 21.8 Electronic Signature(s) Signed: 06/28/2016 4:41:59 PM By: Alric Quan Entered By: Alric Quan on 06/28/2016 10:27:44

## 2016-07-05 ENCOUNTER — Encounter: Payer: Medicare Other | Attending: Internal Medicine | Admitting: Internal Medicine

## 2016-07-05 DIAGNOSIS — X58XXXD Exposure to other specified factors, subsequent encounter: Secondary | ICD-10-CM | POA: Insufficient documentation

## 2016-07-05 DIAGNOSIS — S91011D Laceration without foreign body, right ankle, subsequent encounter: Secondary | ICD-10-CM | POA: Diagnosis not present

## 2016-07-05 DIAGNOSIS — L89150 Pressure ulcer of sacral region, unstageable: Secondary | ICD-10-CM | POA: Diagnosis not present

## 2016-07-05 DIAGNOSIS — E11622 Type 2 diabetes mellitus with other skin ulcer: Secondary | ICD-10-CM | POA: Insufficient documentation

## 2016-07-05 DIAGNOSIS — S21219D Laceration without foreign body of unspecified back wall of thorax without penetration into thoracic cavity, subsequent encounter: Secondary | ICD-10-CM | POA: Insufficient documentation

## 2016-07-06 NOTE — Progress Notes (Signed)
[name], [name] ([id number]) Visit Report for 07/05/2016 Arrival Information Details Patient Name: [name], [name] Date of Service: 07/05/2016 11:00 AM Medical Record Number: [id number] Patient Account Number: [id number] Date of Birth/Sex: 08-28-1940 (75 y.o. Male) Treating [name]: [name] Primary Care [name]: [name] Other Clinician: Referring [name]: [name] Treating [name]/Extender: [name] in Treatment: 1 Visit Information History Since Last Visit Added or deleted any medications: No Patient Arrived: [name] Any new allergies or adverse reactions: No Arrival Time: 11:17 Had a fall or experienced change in No Accompanied By: daughter activities of daily living that may affect Transfer Assistance: None risk of falls: Patient Identification Verified: Yes Signs or symptoms of abuse/neglect since last No Secondary Verification Process Yes visito Completed: Hospitalized since last visit: No Patient Requires Transmission-Based No Has Dressing in Place as Prescribed: Yes Precautions: Pain Present Now: No Patient Has Alerts: Yes Patient Alerts: DM II Electronic Signature(s) Signed: 07/05/2016 3:55:06 PM By: [name], BSN, [name], CWS, [name], BSN Entered By: [name], BSN, [name], CWS, Charles on 07/05/2016 11:18:09 [place], [name] ([id number]) -------------------------------------------------------------------------------- Clinic Level of Care Assessment Details Patient Name: [name] Date of Service: 07/05/2016 11:00 AM Medical Record Number: [id number] Patient Account Number: [id number] Date of Birth/Sex: 03-Jul-1940 (75 y.o. Male) Treating [name]: [name] Primary Care [name]: [name] Other Clinician: Referring [name]: [name] Treating [name]/Extender: [name] in Treatment: 1 Clinic Level of Care Assessment Items TOOL 3 Quantity Score []  - Use when EandM and Procedure is performed on FOLLOW-UP visit 0 ASSESSMENTS -  Nursing Assessment / Reassessment []  - Reassessment of Co-morbidities (includes updates in patient status) 0 X - Reassessment of Adherence to Treatment Plan 1 5 ASSESSMENTS - Wound and Skin Assessment / Reassessment []  - Points for Wound Assessment can only be taken for a new wound of unknown 0 or different etiology and a procedure is NOT performed to that wound []  - Simple Wound Assessment / Reassessment - one wound 0 []  - Complex Wound Assessment / Reassessment - multiple wounds 0 []  - Dermatologic / Skin Assessment (not related to wound area) 0 ASSESSMENTS - Focused Assessment []  - Circumferential Edema Measurements - multi extremities 0 []  - Nutritional Assessment / Counseling / Intervention 0 []  - Lower Extremity Assessment (monofilament, tuning fork, pulses) 0 []  - Peripheral Arterial Disease Assessment (using hand held doppler) 0 ASSESSMENTS - Ostomy and/or Continence Assessment and Care []  - Incontinence Assessment and Management 0 []  - Ostomy Care Assessment and Management (repouching, etc.) 0 PROCESS - Coordination of Care []  - Points for Discharge Coordination can only be taken for a new wound of 0 unknown or different etiology and a procedure is NOT performed to that wound []  - Simple Patient / Family Education for ongoing care 0 []  - Complex (extensive) Patient / Family Education for ongoing care 0 [name] ([id number]) []  - Staff obtains Consents, Records, Test Results / Process Orders 0 []  - Staff telephones HHA, Nursing Homes / Clarify orders / etc 0 []  - Routine Transfer to another Facility (non-emergent condition) 0 []  - Routine Hospital Admission (non-emergent condition) 0 []  - New Admissions / Biomedical engineer / Ordering NPWT, Apligraf, etc. 0 []  - Emergency Hospital Admission (emergent condition) 0 []  - Simple Discharge Coordination 0 []  - Complex (extensive) Discharge Coordination 0 PROCESS - Special Needs []  - Pediatric / Minor Patient  Management 0 []  - Isolation Patient Management 0 []  - Hearing / Language / Visual special needs 0 []  -  Assessment of Community assistance (transportation, D/C planning, etc.) 0 []  - Additional assistance / Altered mentation 0 []  - Support Surface(s) Assessment (bed, cushion, seat, etc.) 0 INTERVENTIONS - Wound Cleansing / Measurement []  - Points for Wound Cleaning / Measurement, Wound Dressing, Specimen 0 Collection and Specimen taken to lab can only be taken for a new wound of unknown or different etiology and a procedure is NOT performed to that wound []  - Simple Wound Cleansing - one wound 0 []  - Complex Wound Cleansing - multiple wounds 0 []  - Wound Imaging (photographs - any number of wounds) 0 []  - Wound Tracing (instead of photographs) 0 []  - Simple Wound Measurement - one wound 0 []  - Complex Wound Measurement - multiple wounds 0 INTERVENTIONS - Wound Dressings []  - Small Wound Dressing one or multiple wounds 0 [name], [name] ([id number]) X - Medium Wound Dressing one or multiple wounds 2 15 []  - Large Wound Dressing one or multiple wounds 0 INTERVENTIONS - Miscellaneous []  - External ear exam 0 []  - Specimen Collection (cultures, biopsies, blood, body fluids, etc.) 0 []  - Specimen(s) / Culture(s) sent or taken to Lab for analysis 0 []  - Patient Transfer (multiple staff / Harrel Lemon Lift / Similar devices) 0 []  - Simple Staple / Suture removal (25 or less) 0 []  - Complex Staple / Suture removal (26 or more) 0 []  - Hypo / Hyperglycemic Management (close monitor of Blood Glucose) 0 []  - Ankle / Brachial Index (ABI) - do not check if billed separately 0 X - Vital Signs 1 5 Has the patient been seen at the hospital within the last three years: Yes Total Score: 40 Level Of Care: New/Established - Level 2 Electronic Signature(s) Signed: 07/05/2016 3:55:06 PM By: [name], BSN, [name], CWS, [name], BSN Entered By: [name], BSN, [name], CWS, Charles on 07/05/2016 15:31:54 [name], [name]  ([id number]) -------------------------------------------------------------------------------- Encounter Discharge Information Details Patient Name: [name] Date of Service: 07/05/2016 11:00 AM Medical Record Number: [id number] Patient Account Number: [id number] Date of Birth/Sex: 1940-09-25 (75 y.o. Male) Treating [name]: [name] Primary Care Naveyah Iacovelli: [name] Other Clinician: Referring Aamir Mclinden: [name] Treating Mckynlee Luse/Extender: [name] in Treatment: 1 Encounter Discharge Information Items Schedule Follow-up Appointment: No Medication Reconciliation completed No and provided to Patient/Care Cherilynn Schomburg: Provided on Clinical Summary of Care: 07/05/2016 Form Type Recipient Paper Patient LM Electronic Signature(s) Signed: 07/05/2016 12:17:32 PM By: Ruthine Dose Entered By: Ruthine Dose on 07/05/2016 12:17:31 Wack, [name] ([id number]) -------------------------------------------------------------------------------- Lower Extremity Assessment Details Patient Name: [name] Date of Service: 07/05/2016 11:00 AM Medical Record Number: [id number] Patient Account Number: [id number] Date of Birth/Sex: 11/27/1940 (75 y.o. Male) Treating [name]: [name] Primary Care Jailin Moomaw: [name] Other Clinician: Referring Virgie Chery: [name] Treating Bessie Boyte/Extender: Ricard Dillon Weeks in Treatment: 1 Vascular Assessment Pulses: Dorsalis Pedis Palpable: [Right:Yes] Posterior Tibial Extremity colors, hair growth, and conditions: Extremity Color: [Right:Pale] Hair Growth on Extremity: [Right:Yes] Temperature of Extremity: [Right:[name]] Capillary Refill: [Right:> 3 seconds] Dependent Rubor: [Right:No] Blanched when Elevated: [Right:No] Lipodermatosclerosis: [Right:No] Toe Nail Assessment Left: Right: Thick: No Discolored: No Deformed: No Improper Length and Hygiene: Yes Electronic Signature(s) Signed: 07/05/2016 3:55:06 PM By: [name],  BSN, [name], CWS, [name], BSN Entered By: [name], BSN, [name], CWS, Charles on 07/05/2016 11:32:30 [place], [name] ([id number]) -------------------------------------------------------------------------------- Multi Wound Chart Details Patient Name: [name] Date of Service: 07/05/2016 11:00 AM Medical Record Number: [id number] Patient Account Number: [id number] Date of Birth/Sex: Jan 23, 1940 (76 y.o. Male) Treating [name]:  [name] Primary Care Ameirah Khatoon: [name] Other Clinician: Referring Jahdiel Krol: [name] Treating Amera Banos/Extender: [name] in Treatment: 1 Vital Signs Height(in): 69 Pulse(bpm): 67 Weight(lbs): 147.5 Blood Pressure 103/36 (mmHg): Body Mass Index(BMI): 22 Temperature(F): 97.9 Respiratory Rate 16 (breaths/min): Photos: Wound Location: Sacrum Back - Midline Right Back Wounding Event: Pressure Injury Trauma Trauma Primary Etiology: Pressure Ulcer Trauma, Other Trauma, Other Date Acquired: 06/14/2016 06/14/2016 06/14/2016 Weeks of Treatment: 1 1 1  Wound Status: Open Open Open Clustered Wound: No No Yes Clustered Quantity: N/A N/A 3 Measurements L x W x D 4x6.5x0.1 2x2.5x0.1 0.3x0.8x0.1 (cm) Area (cm) : 20.42 3.927 0.188 Volume (cm) : 2.042 0.393 0.019 % Reduction in Area: 46.20% -122.20% 74.00% % Reduction in Volume: 73.10% -122.00% 73.60% Classification: Category/Stage II Partial Thickness Partial Thickness Exudate Amount: Large Large Large Exudate Type: Serosanguineous Serosanguineous Serosanguineous Exudate Color: red, brown red, brown red, brown Wound Margin: Distinct, outline attached Flat and Intact Flat and Intact Granulation Amount: None Present (0%) None Present (0%) None Present (0%) Necrotic Amount: Large (67-100%) Large (67-100%) Large (67-100%) Necrotic Tissue: Eschar, Adherent Slough Eschar Eschar Epithelialization: None None None Periwound Skin Texture: No Abnormalities Noted No Abnormalities Noted No Abnormalities  Noted Bayon, Dwaine K. ([id number]) Periwound Skin Dry/Scaly: Yes Dry/Scaly: Yes No Abnormalities Noted Moisture: Periwound Skin Color: Erythema: Yes No Abnormalities Noted No Abnormalities Noted Erythema Location: Circumferential N/A N/A Temperature: No Abnormality No Abnormality No Abnormality Tenderness on Yes Yes Yes Palpation: Wound Preparation: Ulcer Cleansing: Ulcer Cleansing: Ulcer Cleansing: Rinsed/Irrigated with Rinsed/Irrigated with Rinsed/Irrigated with Saline Saline Saline Topical Anesthetic Topical Anesthetic Topical Anesthetic Applied: Other: lidocaine Applied: Other: lidocaine Applied: Other: lidocaine 4% 4% 4% Wound Number: 6 N/A N/A Photos: N/A N/A Wound Location: Right Malleolus - Lateral N/A N/A Wounding Event: Trauma N/A N/A Primary Etiology: Trauma, Other N/A N/A Date Acquired: 06/14/2016 N/A N/A Weeks of Treatment: 1 N/A N/A Wound Status: Open N/A N/A Clustered Wound: No N/A N/A Clustered Quantity: N/A N/A N/A Measurements L x W x D 0.6x0.5x0.1 N/A N/A (cm) Area (cm) : 0.236 N/A N/A Volume (cm) : 0.024 N/A N/A % Reduction in Area: -7.30% N/A N/A % Reduction in Volume: -9.10% N/A N/A Classification: Partial Thickness N/A N/A Exudate Amount: Large N/A N/A Exudate Type: Serosanguineous N/A N/A Exudate Color: red, brown N/A N/A Wound Margin: Flat and Intact N/A N/A Granulation Amount: None Present (0%) N/A N/A Necrotic Amount: Large (67-100%) N/A N/A Necrotic Tissue: Eschar N/A N/A Epithelialization: None N/A N/A Periwound Skin Texture: No Abnormalities Noted N/A N/A Periwound Skin No Abnormalities Noted N/A N/A Moisture: Periwound Skin Color: No Abnormalities Noted N/A N/A Erythema Location: N/A N/A N/A Derner, Leavy K. ([id number]) Temperature: No Abnormality N/A N/A Tenderness on Yes N/A N/A Palpation: Wound Preparation: Ulcer Cleansing: N/A N/A Rinsed/Irrigated with Saline Topical Anesthetic Applied: Other: lidocaine 4% Treatment  Notes Electronic Signature(s) Signed: 07/05/2016 5:35:48 PM By: Linton Ham MD Entered By: Linton Ham on 07/05/2016 12:40:21 [place], [name] ([id number]) -------------------------------------------------------------------------------- Multi-Disciplinary Care Plan Details Patient Name: [name] Date of Service: 07/05/2016 11:00 AM Medical Record Number: [id number] Patient Account Number: [id number] Date of Birth/Sex: 10-11-1940 (76 y.o. Male) Treating [name]: [name] Primary Care Janece Laidlaw: [name] Other Clinician: Referring Dante Cooter: [name] Treating Flara Storti/Extender: [name] in Treatment: 1 Active Inactive ` Abuse / Safety / Falls / Self Care Management Nursing Diagnoses: Potential for falls Goals: Patient will not experience any injury related to falls Date Initiated: 06/28/2016 Target Resolution Date: 10/08/2016 Goal Status: Active  Interventions: Assess Activities of Daily Living upon admission and as needed Assess: immobility, friction, shearing, incontinence upon admission and as needed Assess impairment of mobility on admission and as needed per policy Notes: ` Nutrition Nursing Diagnoses: Imbalanced nutrition Impaired glucose control: actual or potential Potential for alteratiion in Nutrition/Potential for imbalanced nutrition Goals: Patient/caregiver agrees to and verbalizes understanding of need to use nutritional supplements and/or vitamins as prescribed Date Initiated: 06/28/2016 Target Resolution Date: 09/10/2016 Goal Status: Active Patient/caregiver verbalizes understanding of need to maintain therapeutic glucose control per primary care physician Date Initiated: 06/28/2016 Target Resolution Date: 09/10/2016 Goal Status: Active Interventions: MCADAMSOzell, Juhasz ([id number]) Assess patient nutrition upon admission and as needed per policy Notes: ` Orientation to the Wound Care Program Nursing Diagnoses: Knowledge  deficit related to the wound healing center program Goals: Patient/caregiver will verbalize understanding of the [place] Program Date Initiated: 06/28/2016 Target Resolution Date: 07/09/2016 Goal Status: Active Interventions: Provide education on orientation to the wound center Notes: ` Pain, Acute or Chronic Nursing Diagnoses: Pain, acute or chronic: actual or potential Potential alteration in comfort, pain Goals: Patient/caregiver will verbalize adequate pain control between visits Date Initiated: 06/28/2016 Target Resolution Date: 10/08/2016 Goal Status: Active Interventions: Assess comfort goal upon admission Complete pain assessment as per visit requirements Notes: ` Pressure Nursing Diagnoses: Knowledge deficit related to causes and risk factors for pressure ulcer development Knowledge deficit related to management of pressures ulcers Potential for impaired tissue integrity related to pressure, friction, moisture, and shear Goals: Patient will remain free from development of additional pressure ulcers Jowett, ERMINIO NYGARD ([id number]) Date Initiated: 06/28/2016 Target Resolution Date: 10/08/2016 Goal Status: Active Interventions: Assess: immobility, friction, shearing, incontinence upon admission and as needed Assess offloading mechanisms upon admission and as needed Notes: ` Wound/Skin Impairment Nursing Diagnoses: Impaired tissue integrity Knowledge deficit related to ulceration/compromised skin integrity Goals: Ulcer/skin breakdown will have a volume reduction of 80% by week 12 Date Initiated: 06/28/2016 Target Resolution Date: 10/01/2016 Goal Status: Active Interventions: Assess patient/caregiver ability to perform ulcer/skin care regimen upon admission and as needed Assess ulceration(s) every visit Notes: Electronic Signature(s) Signed: 07/05/2016 3:55:06 PM By: [name], BSN, [name], CWS, [name], BSN Entered By: [name], BSN, [name], CWS, Charles on 07/05/2016  11:37:49 [place], [name] ([id number]) -------------------------------------------------------------------------------- Pain Assessment Details Patient Name: [name] Date of Service: 07/05/2016 11:00 AM Medical Record Number: [id number] Patient Account Number: [id number] Date of Birth/Sex: 08-Jul-1940 (75 y.o. Male) Treating [name]: [name] Primary Care Raychelle Hudman: [name] Other Clinician: Referring Adelyna Brockman: [name] Treating Iain Sawchuk/Extender: Ricard Dillon Weeks in Treatment: 1 Active Problems Location of Pain Severity and Description of Pain Patient Has Paino No Site Locations With Dressing Change: No Pain Management and Medication Current Pain Management: Goals for Pain Management Topical or injectable lidocaine is offered to patient for acute pain when surgical debridement is performed. If needed, Patient is instructed to use over the counter pain medication for the following 24-48 hours after debridement. Wound care MDs do not prescribed pain medications. Patient has chronic pain or uncontrolled pain. Patient has been instructed to make an appointment with their Primary Care Physician for pain management. Electronic Signature(s) Signed: 07/05/2016 3:55:06 PM By: [name], BSN, [name], CWS, [name], BSN Entered By: [name], BSN, [name], CWS, Charles on 07/05/2016 11:18:49 [place], [name] ([id number]) -------------------------------------------------------------------------------- Wound Assessment Details Patient Name: [name] Date of Service: 07/05/2016 11:00 AM Medical Record Number: [id number] Patient Account Number: [id number] Date of Birth/Sex: 1940-04-17 (76 y.o. Male) Treating [name]:  [name] Primary Care Stefen Juba: [name] Other Clinician: Referring Brenlyn Beshara: [name] Treating Maude Gloor/Extender: [name] in Treatment: 1 Wound Status Wound Number: 1 Primary Etiology: Pressure Ulcer Wound Location: Sacrum Wound Status:  Open Wounding Event: Pressure Injury Date Acquired: 06/14/2016 Weeks Of Treatment: 1 Clustered Wound: No Photos Wound Measurements Length: (cm) 4 Width: (cm) 6.5 Depth: (cm) 0.1 Area: (cm) 20.42 Volume: (cm) 2.042 % Reduction in Area: 46.2% % Reduction in Volume: 73.1% Epithelialization: None Tunneling: No Undermining: No Wound Description Classification: Category/Stage II Wound Margin: Distinct, outline attached Exudate Amount: Large Exudate Type: Serosanguineous Exudate Color: red, brown Foul Odor After Cleansing: No Slough/Fibrino Yes Wound Bed Granulation Amount: None Present (0%) Necrotic Amount: Large (67-100%) Necrotic Quality: Eschar, Adherent Slough Periwound Skin Texture Texture Color No Abnormalities Noted: No No Abnormalities Noted: No Erythema: Yes Moisture Falkner, Arif K. ([id number]) No Abnormalities Noted: No Erythema Location: Circumferential Dry / Scaly: Yes Temperature / Pain Temperature: No Abnormality Tenderness on Palpation: Yes Wound Preparation Ulcer Cleansing: Rinsed/Irrigated with Saline Topical Anesthetic Applied: Other: lidocaine 4%, Electronic Signature(s) Signed: 07/05/2016 3:55:06 PM By: [name], BSN, [name], CWS, [name], BSN Entered By: [name], BSN, [name], CWS, Charles on 07/05/2016 11:33:18 [place], [name] ([id number]) -------------------------------------------------------------------------------- Wound Assessment Details Patient Name: [name] Date of Service: 07/05/2016 11:00 AM Medical Record Number: [id number] Patient Account Number: [id number] Date of Birth/Sex: 1940-05-14 (75 y.o. Male) Treating [name]: [name] Primary Care Shanyn Preisler: [name] Other Clinician: Referring Rondi Ivy: [name] Treating Khali Perella/Extender: Ricard Dillon Weeks in Treatment: 1 Wound Status Wound Number: 2 Primary Etiology: Trauma, Other Wound Location: Back - Midline Wound Status: Open Wounding Event: Trauma Date Acquired:  06/14/2016 Weeks Of Treatment: 1 Clustered Wound: No Photos Wound Measurements Length: (cm) 2 Width: (cm) 2.5 Depth: (cm) 0.1 Area: (cm) 3.927 Volume: (cm) 0.393 % Reduction in Area: -122.2% % Reduction in Volume: -122% Epithelialization: None Tunneling: No Undermining: No Wound Description Classification: Partial Thickness Wound Margin: Flat and Intact Exudate Amount: Large Exudate Type: Serosanguineous Exudate Color: red, brown Foul Odor After Cleansing: No Slough/Fibrino Yes Wound Bed Granulation Amount: None Present (0%) Necrotic Amount: Large (67-100%) Necrotic Quality: Eschar Periwound Skin Texture Texture Color No Abnormalities Noted: No No Abnormalities Noted: No Moisture Temperature / Pain Rogue, Allister K. ([id number]) No Abnormalities Noted: No Temperature: No Abnormality Dry / Scaly: Yes Tenderness on Palpation: Yes Wound Preparation Ulcer Cleansing: Rinsed/Irrigated with Saline Topical Anesthetic Applied: Other: lidocaine 4%, Electronic Signature(s) Signed: 07/05/2016 3:55:06 PM By: [name], BSN, [name], CWS, [name], BSN Entered By: [name], BSN, [name], CWS, Charles on 07/05/2016 11:37:17 [place], [name] ([id number]) -------------------------------------------------------------------------------- Wound Assessment Details Patient Name: [name] Date of Service: 07/05/2016 11:00 AM Medical Record Number: [id number] Patient Account Number: [id number] Date of Birth/Sex: Sep 19, 1940 (75 y.o. Male) Treating [name]: [name] Primary Care Shannon Balthazar: [name] Other Clinician: Referring Halo Laski: [name] Treating Sadiel Mota/Extender: Ricard Dillon Weeks in Treatment: 1 Wound Status Wound Number: 3 Primary Etiology: Trauma, Other Wound Location: Right Back Wound Status: Open Wounding Event: Trauma Date Acquired: 06/14/2016 Weeks Of Treatment: 1 Clustered Wound: Yes Photos Wound Measurements Length: (cm) 0.3 Width: (cm) 0.8 Depth: (cm)  0.1 Clustered Quantity: 3 Area: (cm) 0.188 Volume: (cm) 0.019 % Reduction in Area: 74% % Reduction in Volume: 73.6% Epithelialization: None Tunneling: No Undermining: No Wound Description Classification: Partial Thickness Wound Margin: Flat and Intact Exudate Amount: Large Exudate Type: Serosanguineous Exudate Color: red, brown Foul Odor After Cleansing: No Slough/Fibrino Yes Wound Bed Granulation Amount:  None Present (0%) Necrotic Amount: Large (67-100%) Necrotic Quality: Eschar Periwound Skin Texture Texture Color No Abnormalities Noted: No No Abnormalities Noted: No Bungert, Emrick K. ([id number]) Moisture Temperature / Pain No Abnormalities Noted: No Temperature: No Abnormality Tenderness on Palpation: Yes Wound Preparation Ulcer Cleansing: Rinsed/Irrigated with Saline Topical Anesthetic Applied: Other: lidocaine 4%, Electronic Signature(s) Signed: 07/05/2016 3:55:06 PM By: [name], BSN, [name], CWS, [name], BSN Entered By: [name], BSN, [name], CWS, Charles on 07/05/2016 11:36:05 [place], [name] ([id number]) -------------------------------------------------------------------------------- Wound Assessment Details Patient Name: [name] Date of Service: 07/05/2016 11:00 AM Medical Record Number: [id number] Patient Account Number: [id number] Date of Birth/Sex: 01/15/1940 (75 y.o. Male) Treating [name]: [name] Primary Care Anzlee Hinesley: [name] Other Clinician: Referring Fareed Fung: [name] Treating Estela Vinal/Extender: Ricard Dillon Weeks in Treatment: 1 Wound Status Wound Number: 6 Primary Etiology: Trauma, Other Wound Location: Right Malleolus - Lateral Wound Status: Open Wounding Event: Trauma Date Acquired: 06/14/2016 Weeks Of Treatment: 1 Clustered Wound: No Photos Wound Measurements Length: (cm) 0.6 Width: (cm) 0.5 Depth: (cm) 0.1 Area: (cm) 0.236 Volume: (cm) 0.024 % Reduction in Area: -7.3% % Reduction in Volume: -9.1% Epithelialization:  None Tunneling: No Undermining: No Wound Description Classification: Partial Thickness Wound Margin: Flat and Intact Exudate Amount: Large Exudate Type: Serosanguineous Exudate Color: red, brown Foul Odor After Cleansing: No Slough/Fibrino Yes Wound Bed Granulation Amount: None Present (0%) Necrotic Amount: Large (67-100%) Necrotic Quality: Eschar Periwound Skin Texture Texture Color No Abnormalities Noted: No No Abnormalities Noted: No Moisture Temperature / Pain Alkins, Tamari K. ([id number]) No Abnormalities Noted: No Temperature: No Abnormality Tenderness on Palpation: Yes Wound Preparation Ulcer Cleansing: Rinsed/Irrigated with Saline Topical Anesthetic Applied: Other: lidocaine 4%, Electronic Signature(s) Signed: 07/05/2016 3:55:06 PM By: [name], BSN, [name], CWS, [name], BSN Entered By: [name], BSN, [name], CWS, Charles on 07/05/2016 11:36:39 [place], [name] ([id number]) -------------------------------------------------------------------------------- Vitals Details Patient Name: [name] Date of Service: 07/05/2016 11:00 AM Medical Record Number: [id number] Patient Account Number: [id number] Date of Birth/Sex: 04/05/1940 (75 y.o. Male) Treating [name]: [name] Primary Care Kailea Dannemiller: [name] Other Clinician: Referring Lashonda Sonneborn: [name] Treating Jill Ruppe/Extender: [name] in Treatment: 1 Vital Signs Time Taken: 11:18 Temperature (F): 97.9 Height (in): 69 Pulse (bpm): 67 Weight (lbs): 147.5 Respiratory Rate (breaths/min): 16 Body Mass Index (BMI): 21.8 Blood Pressure (mmHg): 103/36 Reference Range: 80 - 120 mg / dl Electronic Signature(s) Signed: 07/05/2016 3:55:06 PM By: [name], BSN, [name], CWS, [name], BSN Entered By: [name], BSN, [name], CWS, Charles on 07/05/2016 11:20:54

## 2016-07-06 NOTE — Progress Notes (Signed)
BRADY, SCHILLER ([id number]) Visit Report for 07/05/2016 Chief Complaint Document Details Patient Name: [name], [name] Date of Service: 07/05/2016 11:00 AM Medical Record Patient Account Number: [id number] Number: Treating RN: Cornell Barman 1940-11-16 (75 y.o. Other Clinician: Date of Birth/Sex: Male) Treating Ilya Neely Primary Care Provider: Johny Drilling Provider/Extender: G Referring Provider: Nettie Elm in Treatment: 1 Information Obtained from: Patient Chief Complaint 06/28/16; patient is here accompanied by his daughter for review of multiple pressure ulcerations predominantly his lower sacrum/coccyx and surrounding buttock's Electronic Signature(s) Signed: 07/05/2016 5:35:48 PM By: Linton Ham MD Entered By: Linton Ham on 07/05/2016 12:40:27 [name], [name] ([id number]) -------------------------------------------------------------------------------- HPI Details Patient Name: [name] Date of Service: 07/05/2016 11:00 AM Medical Record Patient Account Number: [id number] Number: Treating RN: Cornell Barman 1940-06-28 (75 y.o. Other Clinician: Date of Birth/Sex: Male) Treating Rielly Corlett Primary Care Provider: Johny Drilling Provider/Extender: G Referring Provider: Nettie Elm in Treatment: 1 History of Present Illness HPI Description: 06/28/16; this is a 76 year old man who was admitted to hospital from 6/13 through 06/18/16 at [facility] after being found down in a closet in his home. It is not exactly clear how long he was actually there although it was probably for days. His total CK maximum was at 2582. He was admitted with delirium, possibly heat related illness, possible subendocardial MI. An MRI of the brain was negative. During this hospitalization he was noted to have areas of pressure-related injury for the time he was spent on the floor on his back he was stated to have a stage II pressure ulcer over  his buttocks. Multiple excoriations were also noted. He has advanced home care about doing physical therapy there requesting a nurse which seems reasonable. The patient is a diabetic on metformin. They're using bacitracin to all these areas. He was prescribed doxycycline which I believe he is still on. 07/05/16; patient with pressure ulcers on his right greater than left buttock surrounding his coccyx. Excoriation over the right lateral ankle an excoriation on his upper back. They did not get any Santyl or medihoney. We will take care of this today. Electronic Signature(s) Signed: 07/05/2016 5:35:48 PM By: Linton Ham MD Entered By: Linton Ham on 07/05/2016 12:41:40 [name], [name] ([id number]) -------------------------------------------------------------------------------- Physical Exam Details Patient Name: [name] Date of Service: 07/05/2016 11:00 AM Medical Record Patient Account Number: [id number] Number: Treating RN: Cornell Barman 10-26-1940 (75 y.o. Other Clinician: Date of Birth/Sex: Male) Treating Brean Carberry Primary Care Provider: Johny Drilling Provider/Extender: G Referring Provider: Nettie Elm in Treatment: 1 Constitutional Sitting or standing Blood Pressure is within target range for patient.. Pulse regular and within target range for patient.Marland Kitchen Respirations regular, non-labored and within target range.. Temperature is normal and within the target range for the patient.Marland Kitchen appears in no distress. Cardiovascular Pedal pulses palpable and strong bilaterally.. Notes Wound exam oNecrotic area on the right greater than left surrounding the sacrum and soft tissue. Unstageable wounds with dark eschar. Using a #10 blade the areas were crosshatched to allow proper penetration of Santyl or Mehdihoney. oAreas on the right lateral malleolus look a lot better. We have been using collagen here oAreas over his upper back of surface eschar but these  are looking like they're progressing towards healing Electronic Signature(s) Signed: 07/05/2016 5:35:48 PM By: Linton Ham MD Entered By: Linton Ham on 07/05/2016 12:43:00 [name], [name] ([id number]) -------------------------------------------------------------------------------- Physician Orders Details Patient Name: [name] Date of Service: 07/05/2016 11:00 AM Medical Record  Patient Account Number: [id number] Number: Treating RN: Cornell Barman 05/18/1940 (75 y.o. Other Clinician: Date of Birth/Sex: Male) Treating Ranae Casebier Primary Care Provider: Johny Drilling Provider/Extender: G Referring Provider: Nettie Elm in Treatment: 1 Verbal / Phone Orders: No Diagnosis Coding Wound Cleansing Wound #1 Sacrum o Clean wound with Normal Saline. Anesthetic Wound #1 Sacrum o Topical Lidocaine 4% cream applied to wound bed prior to debridement - for clinic use Wound #2 Midline Back o Topical Lidocaine 4% cream applied to wound bed prior to debridement - for clinic use Wound #3 Right Back o Topical Lidocaine 4% cream applied to wound bed prior to debridement - for clinic use Wound #6 Right,Lateral Malleolus o Topical Lidocaine 4% cream applied to wound bed prior to debridement - for clinic use Skin Barriers/Peri-Wound Care Wound #1 Sacrum o Skin Prep Wound #2 Midline Back o Skin Prep Wound #3 Right Back o Skin Prep Wound #6 Right,Lateral Malleolus o Skin Prep Primary Wound Dressing Wound #1 Sacrum o Santyl Ointment o Medihoney gel - Manuka Honey over the counter [name], [name] ([id number]) Wound #2 Midline Back o Other: - Bacitracin Wound #3 Right Back o Other: - Bacitracin o Other: - Bacitracin Wound #6 Right,Lateral Malleolus o Prisma Ag - moisten with saline Secondary Dressing Wound #1 Sacrum o Dry Gauze o Boardered Foam Dressing Wound #2 Midline Back o Dry Gauze o Boardered Foam  Dressing Wound #3 Right Back o Dry Gauze o Boardered Foam Dressing Wound #6 Right,Lateral Malleolus o Dry Gauze o Boardered Foam Dressing Dressing Change Frequency Wound #1 Sacrum o Change dressing every other day. Wound #2 Midline Back o Change dressing every other day. Wound #3 Right Back o Change dressing every other day. Wound #6 Right,Lateral Malleolus o Change dressing every other day. Follow-up Appointments Wound #1 Sacrum o Return Appointment in 1 week. Wound #2 Midline Back o Return Appointment in 1 week. DAMEAN, POFFENBERGER ([id number]) Wound #3 Right Back o Return Appointment in 1 week. Wound #6 Right,Lateral Malleolus o Return Appointment in 1 week. Off-Loading Wound #1 Sacrum o Mattress - HHRN to order mattress overlay o Turn and reposition every 2 hours Wound #2 Midline Back o Mattress - HHRN to order mattress overlay o Turn and reposition every 2 hours Wound #3 Right Back o Mattress - HHRN to order mattress overlay o Turn and reposition every 2 hours Wound #6 Right,Lateral Malleolus o Mattress - HHRN to order mattress overlay o Turn and reposition every 2 hours Additional Orders / Instructions Wound #1 Sacrum o Increase protein intake. Wound #2 Midline Back o Increase protein intake. Wound #3 Right Back o Increase protein intake. Wound #6 Right,Lateral Malleolus o Increase protein intake. Home Health Wound #1 [place] - Patient request to discharge [facility]. Wound #2 Midline Back o D/C Home Health Services - Patient request to discharge [place]. Wound #3 Right Back o D/C Home Health Services - Patient request to discharge [place]. Wound #6 Right,Lateral Malleolus Wire, [name] ([id number]) o D/C Home Health Services - Patient request to discharge [facility]. Medications-please add to medication list. Wound #1 Sacrum o Other: - Vitamin C, Zinc, MVI Wound #2 Midline Back o  Other: - Vitamin C, Zinc, MVI Wound #3 Right Back o Other: - Vitamin C, Zinc, MVI Wound #6 Right,Lateral Malleolus o Other: - Vitamin C, Zinc, MVI Patient Medications Allergies: NKDA Notifications Medication Indication Start End Santyl apply daily to 07/05/2016 wound DOSE topical 250 unit/gram ointment - ointment topical Electronic  Signature(s) Signed: 07/05/2016 4:19:16 PM By: Linton Ham MD Previous Signature: 07/05/2016 3:55:06 PM Version By: Gretta Cool, BSN, RN, CWS, Kim RN, BSN Entered By: Linton Ham on 07/05/2016 [name], [name] ([id number]) -------------------------------------------------------------------------------- Problem List Details Patient Name: [name] Date of Service: 07/05/2016 11:00 AM Medical Record Patient Account Number: [id number] Number: Treating RN: Cornell Barman 10/25/1940 (75 y.o. Other Clinician: Date of Birth/Sex: Male) Treating Kym Scannell Primary Care Provider: Johny Drilling Provider/Extender: G Referring Provider: Nettie Elm in Treatment: 1 Active Problems ICD-10 Encounter Code Description Active Date Diagnosis L89.150 Pressure ulcer of sacral region, unstageable 06/28/2016 Yes S91.011D Laceration without foreign body, right ankle, subsequent 06/28/2016 Yes encounter S21.219D Laceration without foreign body of unspecified back wall 06/28/2016 Yes of thorax without penetration into thoracic cavity, subsequent encounter E11.622 Type 2 diabetes mellitus with other skin ulcer 06/28/2016 Yes Inactive Problems Resolved Problems Electronic Signature(s) Signed: 07/05/2016 5:35:48 PM By: Linton Ham MD Entered By: Linton Ham on 07/05/2016 12:40:11 Smith, [name] ([id number]) -------------------------------------------------------------------------------- Progress Note Details Patient Name: [name] Date of Service: 07/05/2016 11:00 AM Medical Record Patient Account Number:  [id number] Number: Treating RN: Cornell Barman 10/12/1940 (75 y.o. Other Clinician: Date of Birth/Sex: Male) Treating Jaliyah Fotheringham Primary Care Provider: Johny Drilling Provider/Extender: G Referring Provider: Nettie Elm in Treatment: 1 Subjective Chief Complaint Information obtained from Patient 06/28/16; patient is here accompanied by his daughter for review of multiple pressure ulcerations predominantly his lower sacrum/coccyx and surrounding buttock's History of Present Illness (HPI) 06/28/16; this is a 76 year old man who was admitted to hospital from 6/13 through 06/18/16 at [facility] after being found down in a closet in his home. It is not exactly clear how long he was actually there although it was probably for days. His total CK maximum was at 2582. He was admitted with delirium, possibly heat related illness, possible subendocardial MI. An MRI of the brain was negative. During this hospitalization he was noted to have areas of pressure-related injury for the time he was spent on the floor on his back he was stated to have a stage II pressure ulcer over his buttocks. Multiple excoriations were also noted. He has advanced home care about doing physical therapy there requesting a nurse which seems reasonable. The patient is a diabetic on metformin. They're using bacitracin to all these areas. He was prescribed doxycycline which I believe he is still on. 07/05/16; patient with pressure ulcers on his right greater than left buttock surrounding his coccyx. Excoriation over the right lateral ankle an excoriation on his upper back. They did not get any Santyl or medihoney. We will take care of this today. Objective Constitutional Sitting or standing Blood Pressure is within target range for patient.. Pulse regular and within target range for patient.Marland Kitchen Respirations regular, non-labored and within target range.. Temperature is normal and within the target range  for the patient.Marland Kitchen appears in no distress. Vitals Time Taken: 11:18 AM, Height: 69 in, Weight: 147.5 lbs, BMI: 21.8, Temperature: 97.9 F, Pulse: 67 [name], Charles K. ([id number]) bpm, Respiratory Rate: 16 breaths/min, Blood Pressure: 103/36 mmHg. Cardiovascular Pedal pulses palpable and strong bilaterally.. General Notes: Wound exam Necrotic area on the right greater than left surrounding the sacrum and soft tissue. Unstageable wounds with dark eschar. Using a #10 blade the areas were crosshatched to allow proper penetration of Santyl or Mehdihoney. Areas on the right lateral malleolus look a lot better. We have been using collagen here Areas over his upper back of  surface eschar but these are looking like they're progressing towards healing Integumentary (Hair, Skin) Wound #1 status is Open. Original cause of wound was Pressure Injury. The wound is located on the Sacrum. The wound measures 4cm length x 6.5cm width x 0.1cm depth; 20.42cm^2 area and 2.042cm^3 volume. There is no tunneling or undermining noted. There is a large amount of serosanguineous drainage noted. The wound margin is distinct with the outline attached to the wound base. There is no granulation within the wound bed. There is a large (67-100%) amount of necrotic tissue within the wound bed including Eschar and Adherent Slough. The periwound skin appearance exhibited: Dry/Scaly, Erythema. The surrounding wound skin color is noted with erythema which is circumferential. Periwound temperature was noted as No Abnormality. The periwound has tenderness on palpation. Wound #2 status is Open. Original cause of wound was Trauma. The wound is located on the Midline Back. The wound measures 2cm length x 2.5cm width x 0.1cm depth; 3.927cm^2 area and 0.393cm^3 volume. There is no tunneling or undermining noted. There is a large amount of serosanguineous drainage noted. The wound margin is flat and intact. There is no granulation within  the wound bed. There is a large (67- 100%) amount of necrotic tissue within the wound bed including Eschar. The periwound skin appearance exhibited: Dry/Scaly. Periwound temperature was noted as No Abnormality. The periwound has tenderness on palpation. Wound #3 status is Open. Original cause of wound was Trauma. The wound is located on the Right Back. The wound measures 0.3cm length x 0.8cm width x 0.1cm depth; 0.188cm^2 area and 0.019cm^3 volume. There is no tunneling or undermining noted. There is a large amount of serosanguineous drainage noted. The wound margin is flat and intact. There is no granulation within the wound bed. There is a large (67- 100%) amount of necrotic tissue within the wound bed including Eschar. Periwound temperature was noted as No Abnormality. The periwound has tenderness on palpation. Wound #6 status is Open. Original cause of wound was Trauma. The wound is located on the Right,Lateral Malleolus. The wound measures 0.6cm length x 0.5cm width x 0.1cm depth; 0.236cm^2 area and 0.024cm^3 volume. There is no tunneling or undermining noted. There is a large amount of serosanguineous drainage noted. The wound margin is flat and intact. There is no granulation within the wound bed. There is a large (67-100%) amount of necrotic tissue within the wound bed including Eschar. Periwound temperature was noted as No Abnormality. The periwound has tenderness on palpation. Assessment [name], [name] ([id number]) Active Problems ICD-10 L89.150 - Pressure ulcer of sacral region, unstageable S91.011D - Laceration without foreign body, right ankle, subsequent encounter S21.219D - Laceration without foreign body of unspecified back wall of thorax without penetration into thoracic cavity, subsequent encounter E11.622 - Type 2 diabetes mellitus with other skin ulcer Plan Wound Cleansing: Wound #1 Sacrum: Clean wound with Normal Saline. Anesthetic: Wound #1 Sacrum: Topical  Lidocaine 4% cream applied to wound bed prior to debridement - for clinic use Wound #2 Midline Back: Topical Lidocaine 4% cream applied to wound bed prior to debridement - for clinic use Wound #3 Right Back: Topical Lidocaine 4% cream applied to wound bed prior to debridement - for clinic use Wound #6 Right,Lateral Malleolus: Topical Lidocaine 4% cream applied to wound bed prior to debridement - for clinic use Skin Barriers/Peri-Wound Care: Wound #1 Sacrum: Skin Prep Wound #2 Midline Back: Skin Prep Wound #3 Right Back: Skin Prep Wound #6 Right,Lateral Malleolus: Skin Prep Primary Wound Dressing: Wound #  1 Sacrum: Santyl Ointment Medihoney gel - Manuka Honey over the counter Wound #2 Midline Back: Other: - Bacitracin Wound #3 Right Back: Other: - Bacitracin Other: - Bacitracin Wound #6 Right,Lateral Malleolus: Prisma Ag - moisten with saline Secondary Dressing: Wound #1 Sacrum: [name], Charles K. ([id number]) Dry Gauze Boardered Foam Dressing Wound #2 Midline Back: Dry Gauze Boardered Foam Dressing Wound #3 Right Back: Dry Gauze Boardered Foam Dressing Wound #6 Right,Lateral Malleolus: Dry Gauze Boardered Foam Dressing Dressing Change Frequency: Wound #1 Sacrum: Change dressing every other day. Wound #2 Midline Back: Change dressing every other day. Wound #3 Right Back: Change dressing every other day. Wound #6 Right,Lateral Malleolus: Change dressing every other day. Follow-up Appointments: Wound #1 Sacrum: Return Appointment in 1 week. Wound #2 Midline Back: Return Appointment in 1 week. Wound #3 Right Back: Return Appointment in 1 week. Wound #6 Right,Lateral Malleolus: Return Appointment in 1 week. Off-Loading: Wound #1 Sacrum: Mattress - HHRN to order mattress overlay Turn and reposition every 2 hours Wound #2 Midline Back: Mattress - HHRN to order mattress overlay Turn and reposition every 2 hours Wound #3 Right Back: Mattress - HHRN to order  mattress overlay Turn and reposition every 2 hours Wound #6 Right,Lateral Malleolus: Mattress - HHRN to order mattress overlay Turn and reposition every 2 hours Additional Orders / Instructions: Wound #1 Sacrum: Increase protein intake. Wound #2 Midline Back: Increase protein intake. Wound #3 Right Back: Increase protein intake. Wound #6 Right,Lateral Malleolus: Increase protein intake. [name], [name] ([id number]) Home Health: Wound #1 Sacrum: [place] for [place] Nurse may visit PRN to address patient s wound care needs. FACE TO FACE ENCOUNTER: MEDICARE and MEDICAID PATIENTS: I certify that this patient is under my care and that I had a face-to-face encounter that meets the physician face-to-face encounter requirements with this patient on this date. The encounter with the patient was in whole or in part for the following MEDICAL CONDITION: (primary reason for [place]) MEDICAL NECESSITY: I certify, that based on my findings, NURSING services are a medically necessary home health service. HOME BOUND STATUS: I certify that my clinical findings support that this patient is homebound (i.e., Due to illness or injury, pt requires aid of supportive devices such as crutches, cane, wheelchairs, walkers, the use of special transportation or the assistance of another person to leave their place of residence. There is a normal inability to leave the home and doing so requires considerable and taxing effort. Other absences are for medical reasons / religious services and are infrequent or of short duration when for other reasons). If current dressing causes regression in wound condition, may D/C ordered dressing product/s and apply Normal Saline Moist Dressing daily until next [place] / Other MD appointment. [place] of regression in wound condition at [phone]. Please direct any NON-WOUND related issues/requests for  orders to patient's Primary Care Physician Wound #2 Midline Back: [place] for [place] Nurse may visit PRN to address patient s wound care needs. FACE TO FACE ENCOUNTER: MEDICARE and MEDICAID PATIENTS: I certify that this patient is under my care and that I had a face-to-face encounter that meets the physician face-to-face encounter requirements with this patient on this date. The encounter with the patient was in whole or in part for the following MEDICAL CONDITION: (primary reason for [place]) MEDICAL NECESSITY: I certify, that based on my findings, NURSING services are a medically necessary home health service. HOME  BOUND STATUS: I certify that my clinical findings support that this patient is homebound (i.e., Due to illness or injury, pt requires aid of supportive devices such as crutches, cane, wheelchairs, walkers, the use of special transportation or the assistance of another person to leave their place of residence. There is a normal inability to leave the home and doing so requires considerable and taxing effort. Other absences are for medical reasons / religious services and are infrequent or of short duration when for other reasons). If current dressing causes regression in wound condition, may D/C ordered dressing product/s and apply Normal Saline Moist Dressing daily until next [place] / Other MD appointment. [place] of regression in wound condition at [phone]. Please direct any NON-WOUND related issues/requests for orders to patient's Primary Care Physician Wound #3 Right Back: [place] for [place] Nurse may visit PRN to address patient s wound care needs. FACE TO FACE ENCOUNTER: MEDICARE and MEDICAID PATIENTS: I certify that this patient is under my care and that I had a face-to-face encounter that meets the physician face-to-face encounter requirements with this patient  on this date. The encounter with the patient was in whole or in part for the following MEDICAL CONDITION: (primary reason for [place]) MEDICAL NECESSITY: I certify, that based on my findings, NURSING services are a medically necessary home health service. HOME BOUND STATUS: I certify that my clinical findings support that this patient is homebound (i.e., Due to illness or injury, pt requires aid of supportive devices such as crutches, cane, wheelchairs, walkers, the use of special transportation or the assistance of another person to leave their place of residence. There is a normal inability to leave the home and doing so requires considerable and taxing effort. Other absences are for medical reasons / religious services and are infrequent or of short duration when for other reasons). If current dressing causes regression in wound condition, may D/C ordered dressing product/s and apply Normal Saline Moist Dressing daily until next [place] / Other MD appointment. [place] of regression in wound condition at [phone]. [name], [name] ([id number]) Please direct any NON-WOUND related issues/requests for orders to patient's Primary Care Physician Wound #6 Right,Lateral Malleolus: [place] for [place] Nurse may visit PRN to address patient s wound care needs. FACE TO FACE ENCOUNTER: MEDICARE and MEDICAID PATIENTS: I certify that this patient is under my care and that I had a face-to-face encounter that meets the physician face-to-face encounter requirements with this patient on this date. The encounter with the patient was in whole or in part for the following MEDICAL CONDITION: (primary reason for [place]) MEDICAL NECESSITY: I certify, that based on my findings, NURSING services are a medically necessary home health service. HOME BOUND STATUS: I certify that my clinical findings support that this patient is  homebound (i.e., Due to illness or injury, pt requires aid of supportive devices such as crutches, cane, wheelchairs, walkers, the use of special transportation or the assistance of another person to leave their place of residence. There is a normal inability to leave the home and doing so requires considerable and taxing effort. Other absences are for medical reasons / religious services and are infrequent or of short duration when for other reasons). If current dressing causes regression in wound condition, may D/C ordered dressing product/s and apply Normal Saline Moist Dressing daily until next [place] / Other MD appointment. [facility] of [facility] in wound condition at [phone]. Please direct any NON-WOUND related issues/requests for orders to patient's Primary Care Physician Medications-please add to medication list.: Wound #1 Sacrum: Other: - Vitamin C, Zinc, MVI Wound #2 Midline Back: Other: - Vitamin C, Zinc, MVI Wound #3 Right Back: Other: - Vitamin C, Zinc, MVI Wound #6 Right,Lateral Malleolus: Other: - Vitamin C, Zinc, MVI #1 either Santyl or medihoney to the sacrum and surrounding buttock's. #2 collagen to the small area over the right lateral malleolus #3 topical antibiotics and a Band-Aid to the upper back areas which I think are well under way to healing. #4 the patient didn't get any product last week either santyl or medihoney. We'll take care of this this week Electronic Signature(s) Signed: 07/05/2016 5:35:48 PM By: Linton Ham MD Entered By: Linton Ham on 07/05/2016 12:44:15 [name], [name] ([id number]) -------------------------------------------------------------------------------- SuperBill Details Patient Name: [name] Date of Service: 07/05/2016 Medical Record Patient Account Number: [id number] Number: Treating RN: Cornell Barman 1940-05-05 (75 y.o. Other Clinician: Date of Birth/Sex: Male) Treating  Maxie Slovacek Primary Care Provider: Johny Drilling Provider/Extender: G Referring Provider: Nettie Elm in Treatment: 1 Diagnosis Coding ICD-10 Codes Code Description L89.150 Pressure ulcer of sacral region, unstageable S91.011D Laceration without foreign body, right ankle, subsequent encounter Laceration without foreign body of unspecified back wall of thorax without penetration into S21.219D thoracic cavity, subsequent encounter E11.622 Type 2 diabetes mellitus with other skin ulcer Facility Procedures CPT4 Code: 84166063 Description: [phone] - WOUND CARE VISIT-LEV 2 EST PT Modifier: Quantity: 1 CPT4 Code: 09323557 Description: 32202 - DEBRIDE W/O ANES NON SELECT Modifier: Quantity: 1 Physician Procedures CPT4 Code Description: [id number] - WC PHYS LEVEL 2 - EST PT ICD-10 Description Diagnosis L89.150 Pressure ulcer of sacral region, unstageable S91.011D Laceration without foreign body, right ankle, subse Modifier: quent encounte Quantity: 1 r Electronic Signature(s) Signed: 07/05/2016 3:55:06 PM By: Gretta Cool, BSN, RN, CWS, Kim RN, BSN Signed: 07/05/2016 5:35:48 PM By: Linton Ham MD Entered By: Gretta Cool, BSN, RN, CWS, Kim on 07/05/2016 15:34:48

## 2016-07-12 ENCOUNTER — Encounter: Payer: Medicare Other | Admitting: Physician Assistant

## 2016-07-12 DIAGNOSIS — E11622 Type 2 diabetes mellitus with other skin ulcer: Secondary | ICD-10-CM | POA: Diagnosis not present

## 2016-07-13 NOTE — Progress Notes (Signed)
[name], [name] ([id number]) Visit Report for 07/12/2016 Arrival Information Details Patient Name: [name], [name] Date of Service: 07/12/2016 2:45 PM Medical Record Number: [id number] Patient Account Number: [id number] Date of Birth/Sex: 1940/09/15 (75 y.o. Male) Treating RN: Ahmed Prima Primary Care Marella Vanderpol: Johny Drilling Other Clinician: Referring Mckenzi Buonomo: Johny Drilling Treating Joven Mom/Extender: Melburn Hake, HOYT Weeks in Treatment: 2 Visit Information History Since Last Visit All ordered tests and consults were completed: No Patient Arrived: Ambulatory Added or deleted any medications: No Arrival Time: 14:50 Any new allergies or adverse reactions: No Accompanied By: daughter Had a fall or experienced change in No Transfer Assistance: None activities of daily living that may affect Patient Identification Verified: Yes risk of falls: Secondary Verification Process Yes Signs or symptoms of abuse/neglect since last No Completed: visito Patient Requires Transmission-Based No Hospitalized since last visit: No Precautions: Has Dressing in Place as Prescribed: Yes Patient Has Alerts: Yes Pain Present Now: No Patient Alerts: DM II Electronic Signature(s) Signed: 07/12/2016 4:36:33 PM By: Alric Quan Entered By: Alric Quan on 07/12/2016 14:50:47 [place], [name] ([id number]) -------------------------------------------------------------------------------- Encounter Discharge Information Details Patient Name: [name] Date of Service: 07/12/2016 2:45 PM Medical Record Number: [id number] Patient Account Number: [id number] Date of Birth/Sex: June 26, 1940 (76 y.o. Male) Treating RN: Ahmed Prima Primary Care Karenna Romanoff: Johny Drilling Other Clinician: Referring Clancy Mullarkey: Johny Drilling Treating Maeleigh Buschman/Extender: Melburn Hake, HOYT Weeks in Treatment: 2 Encounter Discharge Information Items Discharge Pain Level: 0 Discharge Condition: Stable Ambulatory  Status: Ambulatory Discharge Destination: Home Transportation: Private Auto Accompanied By: daughter Schedule Follow-up Appointment: Yes Medication Reconciliation completed and provided to Patient/Care No [name]: Provided on Clinical Summary of Care: 07/12/2016 Form Type Recipient Paper Patient LM Electronic Signature(s) Signed: 07/12/2016 3:40:43 PM By: Ruthine Dose Entered By: Ruthine Dose on 07/12/2016 15:40:42 [place], [name] ([id number]) -------------------------------------------------------------------------------- Lower Extremity Assessment Details Patient Name: [name] Date of Service: 07/12/2016 2:45 PM Medical Record Number: [id number] Patient Account Number: [id number] Date of Birth/Sex: 03/08/1940 (76 y.o. Male) Treating RN: Ahmed Prima Primary Care Hubbert Landrigan: Johny Drilling Other Clinician: Referring Karesha Trzcinski: Johny Drilling Treating Kimara Bencomo/Extender: STONE III, HOYT Weeks in Treatment: 2 Vascular Assessment Pulses: Dorsalis Pedis Palpable: [Right:Yes] Posterior Tibial Extremity colors, hair growth, and conditions: Extremity Color: [Right:Pale] Temperature of Extremity: [Right:Cool] Capillary Refill: [Right:> 3 seconds] Toe Nail Assessment Left: Right: Thick: Yes Discolored: Yes Deformed: Yes Improper Length and Hygiene: No Electronic Signature(s) Signed: 07/12/2016 4:36:33 PM By: Alric Quan Entered By: Alric Quan on 07/12/2016 15:07:58 [name], [name] ([id number]) -------------------------------------------------------------------------------- Multi Wound Chart Details Patient Name: [name] Date of Service: 07/12/2016 2:45 PM Medical Record Number: [id number] Patient Account Number: [id number] Date of Birth/Sex: 02/09/1940 (76 y.o. Male) Treating RN: Ahmed Prima Primary Care Alijah Akram: Johny Drilling Other Clinician: Referring Herbert Marken: Johny Drilling Treating Josephine Rudnick/Extender: STONE III, HOYT Weeks in  Treatment: 2 Vital Signs Height(in): 69 Pulse(bpm): 76 Weight(lbs): 147.5 Blood Pressure 121/51 (mmHg): Body Mass Index(BMI): 22 Temperature(F): 97.6 Respiratory Rate 16 (breaths/min): Photos: [1:No Photos] [2:No Photos] [3:No Photos] Wound Location: [1:Sacrum] [2:Back - Midline] [3:Right Back] Wounding Event: [1:Pressure Injury] [2:Trauma] [3:Trauma] Primary Etiology: [1:Pressure Ulcer] [2:Trauma, Other] [3:Trauma, Other] Date Acquired: [1:06/14/2016] [2:06/14/2016] [3:06/14/2016] Weeks of Treatment: [1:2] [2:2] [3:2] Wound Status: [1:Open] [2:Open] [3:Open] Clustered Wound: [1:No] [2:No] [3:Yes] Clustered Quantity: [1:N/A] [2:N/A] [3:3] Measurements L x W x D 3.7x6.1x0.1 [2:0x0x0] [3:0.2x1.2x0.1] (cm) Area (cm) : [1:17.726] [2:0] [3:0.188] Volume (cm) : [1:1.773] [2:0] [3:0.019] % Reduction in Area: [1:53.30%] [2:100.00%] [3:74.00%] % Reduction in Volume: 76.70% [2:100.00%] [3:73.60%] Classification: [1:Category/Stage II] [  2:Partial Thickness] [3:Partial Thickness] Exudate Amount: [1:Large] [2:None Present] [3:Large] Exudate Type: [1:Serosanguineous] [2:N/A] [3:Serosanguineous] Exudate Color: [1:red, brown] [2:N/A] [3:red, brown] Wound Margin: [1:Distinct, outline attached] [2:Flat and Intact] [3:Flat and Intact] Granulation Amount: [1:None Present (0%)] [2:None Present (0%)] [3:None Present (0%)] Necrotic Amount: [1:Large (67-100%)] [2:None Present (0%)] [3:Large (67-100%)] Necrotic Tissue: [1:Eschar] [2:N/A] [3:Eschar] Epithelialization: [1:None] [2:Large (67-100%)] [3:None] Periwound Skin Texture: No Abnormalities Noted [2:No Abnormalities Noted] [3:No Abnormalities Noted] Periwound Skin [1:Dry/Scaly: Yes] [2:Dry/Scaly: No] [3:No Abnormalities Noted] Moisture: Periwound Skin Color: Erythema: Yes [1:Rubor: Yes] [2:No Abnormalities Noted] [3:No Abnormalities Noted] Erythema Location: Circumferential N/A N/A Temperature: No Abnormality No Abnormality No  Abnormality Tenderness on Yes Yes Yes Palpation: Wound Preparation: Ulcer Cleansing: Ulcer Cleansing: Ulcer Cleansing: Rinsed/Irrigated with Rinsed/Irrigated with Rinsed/Irrigated with Saline Saline Saline Topical Anesthetic Topical Anesthetic Topical Anesthetic Applied: Other: lidocaine Applied: None Applied: Other: lidocaine 4% 4% Wound Number: 6 N/A N/A Photos: No Photos N/A N/A Wound Location: Right Malleolus - Lateral N/A N/A Wounding Event: Trauma N/A N/A Primary Etiology: Trauma, Other N/A N/A Date Acquired: 06/14/2016 N/A N/A Weeks of Treatment: 2 N/A N/A Wound Status: Open N/A N/A Clustered Wound: No N/A N/A Clustered Quantity: N/A N/A N/A Measurements L x W x D 0.4x0.3x0.1 N/A N/A (cm) Area (cm) : 0.094 N/A N/A Volume (cm) : 0.009 N/A N/A % Reduction in Area: 57.30% N/A N/A % Reduction in Volume: 59.10% N/A N/A Classification: Partial Thickness N/A N/A Exudate Amount: Large N/A N/A Exudate Type: Serosanguineous N/A N/A Exudate Color: red, brown N/A N/A Wound Margin: Flat and Intact N/A N/A Granulation Amount: None Present (0%) N/A N/A Necrotic Amount: Large (67-100%) N/A N/A Necrotic Tissue: Adherent Slough N/A N/A Epithelialization: None N/A N/A Periwound Skin Texture: No Abnormalities Noted N/A N/A Periwound Skin Maceration: Yes N/A N/A Moisture: Periwound Skin Color: No Abnormalities Noted N/A N/A Erythema Location: N/A N/A N/A Temperature: No Abnormality N/A N/A Tenderness on Yes N/A N/A Palpation: Wound Preparation: Ulcer Cleansing: N/A N/A Rinsed/Irrigated with Saline [name], Charles K. ([id number]) Topical Anesthetic Applied: Other: lidocaine 4% Treatment Notes Electronic Signature(s) Signed: 07/12/2016 4:36:33 PM By: Alric Quan Entered By: Alric Quan on 07/12/2016 15:11:08 [place], [name] ([id number]) -------------------------------------------------------------------------------- [place] Details Patient  Name: [name] Date of Service: 07/12/2016 2:45 PM Medical Record Number: [id number] Patient Account Number: [id number] Date of Birth/Sex: 02-22-1940 (76 y.o. Male) Treating RN: Ahmed Prima Primary Care Jonise Weightman: Johny Drilling Other Clinician: Referring Trinidy Masterson: Johny Drilling Treating Tomesha Sargent/Extender: Melburn Hake, HOYT Weeks in Treatment: 2 Active Inactive ` Abuse / Safety / Falls / Self Care Management Nursing Diagnoses: Potential for falls Goals: Patient will not experience any injury related to falls Date Initiated: 06/28/2016 Target Resolution Date: 10/08/2016 Goal Status: Active Interventions: Assess Activities of Daily Living upon admission and as needed Assess: immobility, friction, shearing, incontinence upon admission and as needed Assess impairment of mobility on admission and as needed per policy Notes: ` Nutrition Nursing Diagnoses: Imbalanced nutrition Impaired glucose control: actual or potential Potential for alteratiion in Nutrition/Potential for imbalanced nutrition Goals: Patient/caregiver agrees to and verbalizes understanding of need to use nutritional supplements and/or vitamins as prescribed Date Initiated: 06/28/2016 Target Resolution Date: 09/10/2016 Goal Status: Active Patient/caregiver verbalizes understanding of need to maintain therapeutic glucose control per primary care physician Date Initiated: 06/28/2016 Target Resolution Date: 09/10/2016 Goal Status: Active Interventions: MCADAMSJacaden, Forbush ([id number]) Assess patient nutrition upon admission and as needed per policy Notes: ` Orientation to the Wound Care Program Nursing Diagnoses: Knowledge deficit related to the wound  healing center program Goals: Patient/caregiver will verbalize understanding of the [name] Date Initiated: 06/28/2016 Target Resolution Date: 07/09/2016 Goal Status: Active Interventions: Provide education on orientation to the wound  center Notes: ` Pain, Acute or Chronic Nursing Diagnoses: Pain, acute or chronic: actual or potential Potential alteration in comfort, pain Goals: Patient/caregiver will verbalize adequate pain control between visits Date Initiated: 06/28/2016 Target Resolution Date: 10/08/2016 Goal Status: Active Interventions: Assess comfort goal upon admission Complete pain assessment as per visit requirements Notes: ` Pressure Nursing Diagnoses: Knowledge deficit related to causes and risk factors for pressure ulcer development Knowledge deficit related to management of pressures ulcers Potential for impaired tissue integrity related to pressure, friction, moisture, and shear Goals: Patient will remain free from development of additional pressure ulcers [name], [name] ([id number]) Date Initiated: 06/28/2016 Target Resolution Date: 10/08/2016 Goal Status: Active Interventions: Assess: immobility, friction, shearing, incontinence upon admission and as needed Assess offloading mechanisms upon admission and as needed Notes: ` Wound/Skin Impairment Nursing Diagnoses: Impaired tissue integrity Knowledge deficit related to ulceration/compromised skin integrity Goals: Ulcer/skin breakdown will have a volume reduction of 80% by week 12 Date Initiated: 06/28/2016 Target Resolution Date: 10/01/2016 Goal Status: Active Interventions: Assess patient/caregiver ability to perform ulcer/skin care regimen upon admission and as needed Assess ulceration(s) every visit Notes: Electronic Signature(s) Signed: 07/12/2016 4:36:33 PM By: Alric Quan Entered By: Alric Quan on 07/12/2016 15:10:55 [place], [name] ([id number]) -------------------------------------------------------------------------------- Pain Assessment Details Patient Name: [name] Date of Service: 07/12/2016 2:45 PM Medical Record Number: [id number] Patient Account Number: [id number] Date of Birth/Sex: 09-20-1940 (76  y.o. Male) Treating RN: Ahmed Prima Primary Care Kielyn Kardell: Johny Drilling Other Clinician: Referring Juliet Vasbinder: Johny Drilling Treating Sydnie Sigmund/Extender: Melburn Hake, HOYT Weeks in Treatment: 2 Active Problems Location of Pain Severity and Description of Pain Patient Has Paino No Site Locations With Dressing Change: No Pain Management and Medication Current Pain Management: Electronic Signature(s) Signed: 07/12/2016 4:36:33 PM By: Alric Quan Entered By: Alric Quan on 07/12/2016 14:50:53 [name], [name] ([id number]) -------------------------------------------------------------------------------- Patient/Caregiver Education Details Patient Name: [name] Date of Service: 07/12/2016 2:45 PM Medical Record Number: [id number] Patient Account Number: [id number] Date of Birth/Gender: 07/25/1940 (76 y.o. Male) Treating RN: Ahmed Prima Primary Care Physician: Johny Drilling Other Clinician: Referring Physician: Johny Drilling Treating Physician/Extender: Melburn Hake, HOYT Weeks in Treatment: 2 Education Assessment Education Provided To: Patient Education Topics Provided Wound/Skin Impairment: Handouts: Other: change dressing as ordered Methods: Demonstration, Explain/Verbal Responses: State content correctly Electronic Signature(s) Signed: 07/12/2016 4:36:33 PM By: Alric Quan Entered By: Alric Quan on 07/12/2016 15:11:57 [name], [name] ([id number]) -------------------------------------------------------------------------------- Wound Assessment Details Patient Name: [name] Date of Service: 07/12/2016 2:45 PM Medical Record Number: [id number] Patient Account Number: [id number] Date of Birth/Sex: Feb 17, 1940 (76 y.o. Male) Treating RN: Carolyne Fiscal, Debi Primary Care Ashya Nicolaisen: Johny Drilling Other Clinician: Referring Rubin Dais: Johny Drilling Treating Lai Hendriks/Extender: STONE III, HOYT Weeks in Treatment: 2 Wound Status Wound Number:  1 Primary Etiology: Pressure Ulcer Wound Location: Sacrum Wound Status: Open Wounding Event: Pressure Injury Date Acquired: 06/14/2016 Weeks Of Treatment: 2 Clustered Wound: No Photos Photo Uploaded By: Alric Quan on 07/12/2016 15:47:42 Wound Measurements Length: (cm) 3.7 Width: (cm) 6.1 Depth: (cm) 0.1 Area: (cm) 17.726 Volume: (cm) 1.773 % Reduction in Area: 53.3% % Reduction in Volume: 76.7% Epithelialization: None Tunneling: No Undermining: No Wound Description Classification: Category/Stage II Foul Odor Afte Wound Margin: Distinct, outline attached Slough/Fibrino Exudate Amount: Large Exudate Type: Serosanguineous Exudate Color: red, brown r Cleansing: No Yes Wound  Bed Granulation Amount: None Present (0%) Necrotic Amount: Large (67-100%) Necrotic Quality: Eschar Periwound Skin Texture Mancha, Yue K. ([id number]) Texture Color No Abnormalities Noted: No No Abnormalities Noted: No Erythema: Yes Moisture Erythema Location: Circumferential No Abnormalities Noted: No Rubor: Yes Dry / Scaly: Yes Temperature / Pain Temperature: No Abnormality Tenderness on Palpation: Yes Wound Preparation Ulcer Cleansing: Rinsed/Irrigated with Saline Topical Anesthetic Applied: Other: lidocaine 4%, Treatment Notes Wound #1 (Sacrum) 1. Cleansed with: Clean wound with Normal Saline 2. Anesthetic Topical Lidocaine 4% cream to wound bed prior to debridement 3. Peri-wound Care: Skin Prep 4. Dressing Applied: Santyl Ointment 5. Secondary Dressing Applied Bordered Foam Dressing Dry Gauze Electronic Signature(s) Signed: 07/12/2016 4:36:33 PM By: Alric Quan Entered By: Alric Quan on 07/12/2016 15:09:32 [name], [name] ([id number]) -------------------------------------------------------------------------------- Wound Assessment Details Patient Name: [name] Date of Service: 07/12/2016 2:45 PM Medical Record Number: [id number] Patient Account  Number: [id number] Date of Birth/Sex: Feb 01, 1940 (76 y.o. Male) Treating RN: Carolyne Fiscal, Debi Primary Care Eveline Sauve: Johny Drilling Other Clinician: Referring Docie Abramovich: Johny Drilling Treating Mahlon Gabrielle/Extender: Melburn Hake, HOYT Weeks in Treatment: 2 Wound Status Wound Number: 2 Primary Etiology: Trauma, Other Wound Location: Back - Midline Wound Status: Open Wounding Event: Trauma Date Acquired: 06/14/2016 Weeks Of Treatment: 2 Clustered Wound: No Photos Photo Uploaded By: Alric Quan on 07/12/2016 15:47:43 Wound Measurements Length: (cm) 0 % Reduction in Width: (cm) 0 % Reduction in Depth: (cm) 0 Epithelializat Area: (cm) 0 Tunneling: Volume: (cm) 0 Undermining: Area: 100% Volume: 100% ion: Large (67-100%) No No Wound Description Classification: Partial Thickness Wound Margin: Flat and Intact Exudate Amount: None Present Foul Odor After Cleansing: No Slough/Fibrino No Wound Bed Granulation Amount: None Present (0%) Necrotic Amount: None Present (0%) Periwound Skin Texture Texture Color No Abnormalities Noted: No No Abnormalities Noted: No Moisture Temperature / Pain Scheu, Kel K. ([id number]) No Abnormalities Noted: No Temperature: No Abnormality Dry / Scaly: No Tenderness on Palpation: Yes Wound Preparation Ulcer Cleansing: Rinsed/Irrigated with Saline Topical Anesthetic Applied: None Electronic Signature(s) Signed: 07/12/2016 4:36:33 PM By: Alric Quan Entered By: Alric Quan on 07/12/2016 15:09:50 [place], [name] ([id number]) -------------------------------------------------------------------------------- Wound Assessment Details Patient Name: [name] Date of Service: 07/12/2016 2:45 PM Medical Record Number: [id number] Patient Account Number: [id number] Date of Birth/Sex: May 06, 1940 (76 y.o. Male) Treating RN: Carolyne Fiscal, Debi Primary Care Rosea Dory: Johny Drilling Other Clinician: Referring Marissia Blackham: Johny Drilling Treating Jatorian Renault/Extender: STONE III, HOYT Weeks in Treatment: 2 Wound Status Wound Number: 3 Primary Etiology: Trauma, Other Wound Location: Right Back Wound Status: Open Wounding Event: Trauma Date Acquired: 06/14/2016 Weeks Of Treatment: 2 Clustered Wound: Yes Photos Photo Uploaded By: Alric Quan on 07/12/2016 15:48:30 Wound Measurements Length: (cm) 0.2 Width: (cm) 1.2 Depth: (cm) 0.1 Clustered Quantity: 3 Area: (cm) 0.188 Volume: (cm) 0.019 % Reduction in Area: 74% % Reduction in Volume: 73.6% Epithelialization: None Tunneling: No Undermining: No Wound Description Classification: Partial Thickness Wound Margin: Flat and Intact Exudate Amount: Large Exudate Type: Serosanguineous Exudate Color: red, brown Foul Odor After Cleansing: No Slough/Fibrino Yes Wound Bed Granulation Amount: None Present (0%) Necrotic Amount: Large (67-100%) Necrotic Quality: Eschar Zulauf, Omar K. ([id number]) Periwound Skin Texture Texture Color No Abnormalities Noted: No No Abnormalities Noted: No Moisture Temperature / Pain No Abnormalities Noted: No Temperature: No Abnormality Tenderness on Palpation: Yes Wound Preparation Ulcer Cleansing: Rinsed/Irrigated with Saline Topical Anesthetic Applied: Other: lidocaine 4%, Treatment Notes Wound #3 (Right Back) 1. Cleansed with: Clean wound with Normal Saline 2. Anesthetic Topical Lidocaine 4% cream to wound bed  prior to debridement 4. Dressing Applied: Other dressing (specify in notes) 5. Secondary Dressing Applied Dry Gauze Notes Bacitracin, coverlet Electronic Signature(s) Signed: 07/12/2016 4:36:33 PM By: Alric Quan Entered By: Alric Quan on 07/12/2016 15:10:17 [place]ase, [name] ([id number]) -------------------------------------------------------------------------------- Wound Assessment Details Patient Name: [name] Date of Service: 07/12/2016 2:45 PM Medical Record Number:  [id number] Patient Account Number: [id number] Date of Birth/Sex: 23-Jun-1940 (76 y.o. Male) Treating RN: Carolyne Fiscal, Debi Primary Care Hardie Veltre: Johny Drilling Other Clinician: Referring Creta Dorame: Johny Drilling Treating Jawad Wiacek/Extender: Melburn Hake, HOYT Weeks in Treatment: 2 Wound Status Wound Number: 6 Primary Etiology: Trauma, Other Wound Location: Right Malleolus - Lateral Wound Status: Open Wounding Event: Trauma Date Acquired: 06/14/2016 Weeks Of Treatment: 2 Clustered Wound: No Photos Photo Uploaded By: Alric Quan on 07/12/2016 15:48:31 Wound Measurements Length: (cm) 0.4 Width: (cm) 0.3 Depth: (cm) 0.1 Area: (cm) 0.094 Volume: (cm) 0.009 % Reduction in Area: 57.3% % Reduction in Volume: 59.1% Epithelialization: None Tunneling: No Undermining: No Wound Description Classification: Partial Thickness Wound Margin: Flat and Intact Exudate Amount: Large Exudate Type: Serosanguineous Exudate Color: red, brown Foul Odor After Cleansing: No Slough/Fibrino Yes Wound Bed Granulation Amount: None Present (0%) Necrotic Amount: Large (67-100%) Necrotic Quality: Adherent Slough Periwound Skin Texture Paluch, Rawleigh K. ([id number]) Texture Color No Abnormalities Noted: No No Abnormalities Noted: No Moisture Temperature / Pain No Abnormalities Noted: No Temperature: No Abnormality Maceration: Yes Tenderness on Palpation: Yes Wound Preparation Ulcer Cleansing: Rinsed/Irrigated with Saline Topical Anesthetic Applied: Other: lidocaine 4%, Treatment Notes Wound #6 (Right, Lateral Malleolus) 1. Cleansed with: Clean wound with Normal Saline 2. Anesthetic Topical Lidocaine 4% cream to wound bed prior to debridement 4. Dressing Applied: Prisma Ag 5. Secondary Dressing Applied Bordered Foam Dressing Dry Gauze Electronic Signature(s) Signed: 07/12/2016 4:36:33 PM By: Alric Quan Entered By: Alric Quan on 07/12/2016 15:10:43 [place], [name]  ([id number]) -------------------------------------------------------------------------------- [place] Details Patient Name: [name] Date of Service: 07/12/2016 2:45 PM Medical Record Number: [id number] Patient Account Number: [id number] Date of Birth/Sex: 1940/10/04 (76 y.o. Male) Treating RN: Ahmed Prima Primary Care Newel Oien: Johny Drilling Other Clinician: Referring Taji Barretto: Johny Drilling Treating Yaira Bernardi/Extender: Melburn Hake, HOYT Weeks in Treatment: 2 Vital Signs Time Taken: 14:50 Temperature (F): 97.6 Height (in): 69 Pulse (bpm): 76 Weight (lbs): 147.5 Respiratory Rate (breaths/min): 16 Body Mass Index (BMI): 21.8 Blood Pressure (mmHg): 121/51 Reference Range: 80 - 120 mg / dl Electronic Signature(s) Signed: 07/12/2016 4:36:33 PM By: Alric Quan Entered By: Alric Quan on 07/12/2016 14:52:25

## 2016-07-13 NOTE — Progress Notes (Signed)
ERNIE, KASLER ([id number]) Visit Report for 07/12/2016 Chief Complaint Document Details Patient Name: [name], [name] Date of Service: 07/12/2016 2:45 PM Medical Record Number: [id number] Patient Account Number: [id number] Date of Birth/Sex: 01-02-1941 (75 y.o. Male) Treating RN: Ahmed Prima Primary Care Provider: Johny Drilling Other Clinician: Referring Provider: Johny Drilling Treating Provider/Extender: Melburn Hake, Cristianna Cyr Weeks in Treatment: 2 Information Obtained from: Patient Chief Complaint 06/28/16; patient is here accompanied by his daughter for review of multiple pressure ulcerations predominantly his lower sacrum/coccyx and surrounding buttock's Electronic Signature(s) Signed: 07/12/2016 7:35:55 PM By: Worthy Keeler PA-C Entered By: Worthy Keeler on 07/12/2016 15:19:14 [place], [name] ([id number]) -------------------------------------------------------------------------------- Debridement Details Patient Name: [name] Date of Service: 07/12/2016 2:45 PM Medical Record Number: [id number] Patient Account Number: [id number] Date of Birth/Sex: [birth year] (76 y.o. Male) Treating RN: Ahmed Prima Primary Care Provider: Johny Drilling Other Clinician: Referring Provider: Johny Drilling Treating Provider/Extender: Melburn Hake, Honora Searson Weeks in Treatment: 2 Debridement Performed for Wound #1 Sacrum Assessment: Performed By: Physician STONE III, Astrid Vides E., PA-C Debridement: Debridement Pre-procedure Verification/Time Out Yes - 15:23 Taken: Start Time: 15:24 Pain Control: Lidocaine 4% Topical Solution Level: Skin/Subcutaneous Tissue Total Area Debrided (L x 3.7 (cm) x 6.1 (cm) = 22.57 (cm) W): Tissue and other Viable, Non-Viable, Exudate, Fibrin/Slough, Subcutaneous material debrided: Instrument: Curette Bleeding: Minimum Hemostasis Achieved: Pressure End Time: 15:30 Procedural Pain: 0 Post Procedural Pain: 0 Response to Treatment: Procedure was  tolerated well Post Debridement Measurements of Total Wound Length: (cm) 3.7 Stage: Category/Stage II Width: (cm) 6.1 Depth: (cm) 0.1 Volume: (cm) 1.773 Character of Wound/Ulcer Post Requires Further Debridement: Debridement Post Procedure Diagnosis Same as Pre-procedure Electronic Signature(s) Signed: 07/12/2016 4:36:33 PM By: Alric Quan Signed: 07/12/2016 7:35:55 PM By: Worthy Keeler PA-C Entered By: Alric Quan on 07/12/2016 15:31:00 [name], [name] ([id number]) -------------------------------------------------------------------------------- HPI Details Patient Name: [name] Date of Service: 07/12/2016 2:45 PM Medical Record Number: [id number] Patient Account Number: [id number] Date of Birth/Sex: 1940-11-19 (76 y.o. Male) Treating RN: Ahmed Prima Primary Care Provider: Johny Drilling Other Clinician: Referring Provider: Johny Drilling Treating Provider/Extender: Melburn Hake, Shaynna Husby Weeks in Treatment: 2 History of Present Illness HPI Description: 06/28/16; this is a 76 year old man who was admitted to hospital from 6/13 through 06/18/16 at [facility] regional after being found down in a closet in his home. It is not exactly clear how long he was actually there although it was probably for days. His total CK maximum was at 2582. He was admitted with delirium, possibly heat related illness, possible subendocardial MI. An MRI of the brain was negative. During this hospitalization he was noted to have areas of pressure-related injury for the time he was spent on the floor on his back he was stated to have a stage II pressure ulcer over his buttocks. Multiple excoriations were also noted. He has advanced home care about doing physical therapy there requesting a nurse which seems reasonable. The patient is a diabetic on metformin. They're using bacitracin to all these areas. He was prescribed doxycycline which I believe he is still on. 07/05/16; patient with pressure  ulcers on his right greater than left buttock surrounding his coccyx. Excoriation over the right lateral ankle an excoriation on his upper back. They did not get any Santyl or medihoney. We will take care of this today. 07/12/16 unfortunately patient's wounds appeared to be doing better across the board and the eschar that is overlying the sacral wound has loosened up to where I could debride somewhat  today. There's no evidence of Electronic Signature(s) Signed: 07/12/2016 7:35:55 PM By: Worthy Keeler PA-C Entered By: Worthy Keeler on 07/12/2016 15:52:01 [name], [name] ([id number]) -------------------------------------------------------------------------------- Physical Exam Details Patient Name: [name] Date of Service: 07/12/2016 2:45 PM Medical Record Number: [id number] Patient Account Number: [id number] Date of Birth/Sex: 04-Oct-1940 (76 y.o. Male) Treating RN: Ahmed Prima Primary Care Provider: Johny Drilling Other Clinician: Referring Provider: Johny Drilling Treating Provider/Extender: STONE III, Blen Ransome Weeks in Treatment: 2 Constitutional Thin and well-hydrated in no acute distress. Respiratory normal breathing without difficulty. Psychiatric this patient is able to make decisions and demonstrates good insight into disease process. Alert and Oriented x 3. pleasant and cooperative. Notes In the region specifically patient sacral I was able to perform debridement today and in the left sacral region I was able to expose a fairly good granular bed although there were still some areas of slough noted in regard to the right second region I was able to remove a significant layer of stick eschar/necrotic tissue that had previously been crosshatched though there is still a complete layer of slough covering the wound that was inherent and I was not able to remove through sharp debridement. Electronic Signature(s) Signed: 07/12/2016 7:35:55 PM By: Worthy Keeler  PA-C Entered By: Worthy Keeler on 07/12/2016 15:53:06 [name], [name] ([id number]) -------------------------------------------------------------------------------- Physician Orders Details Patient Name: [name] Date of Service: 07/12/2016 2:45 PM Medical Record Number: [id number] Patient Account Number: [id number] Date of Birth/Sex: 1940/02/15 (76 y.o. Male) Treating RN: Carolyne Fiscal, Debi Primary Care Provider: Johny Drilling Other Clinician: Referring Provider: Johny Drilling Treating Provider/Extender: Melburn Hake, Sanah Kraska Weeks in Treatment: 2 Verbal / Phone Orders: Yes ClinicianCarolyne Fiscal, Debi Read Back and Verified: Yes Diagnosis Coding ICD-10 Coding Code Description L89.150 Pressure ulcer of sacral region, unstageable S91.011D Laceration without foreign body, right ankle, subsequent encounter Laceration without foreign body of unspecified back wall of thorax without penetration into S21.219D thoracic cavity, subsequent encounter E11.622 Type 2 diabetes mellitus with other skin ulcer Wound Cleansing Wound #1 Sacrum o Clean wound with Normal Saline. Anesthetic Wound #1 Sacrum o Topical Lidocaine 4% cream applied to wound bed prior to debridement - for clinic use Wound #3 Right Back o Topical Lidocaine 4% cream applied to wound bed prior to debridement - for clinic use Wound #6 Right,Lateral Malleolus o Topical Lidocaine 4% cream applied to wound bed prior to debridement - for clinic use Skin Barriers/Peri-Wound Care Wound #1 Sacrum o Skin Prep Wound #3 Right Back o Skin Prep Wound #6 Right,Lateral Malleolus o Skin Prep Primary Wound Dressing Wound #1 Sacrum o Santyl Ointment o Medihoney gel - Manuka Honey over the counter [place], Dennies K. ([id number]) Wound #3 Right Back o Other: - Bacitracin o Other: - Bacitracin Wound #6 Right,Lateral Malleolus o Prisma Ag - moisten with saline Secondary Dressing Wound #1 Sacrum o Dry  Gauze o Boardered Foam Dressing Wound #3 Right Back o Dry Gauze o Boardered Foam Dressing Wound #6 Right,Lateral Malleolus o Dry Gauze o Boardered Foam Dressing Dressing Change Frequency Wound #1 Sacrum o Change dressing every other day. Wound #3 Right Back o Change dressing every other day. Wound #6 Right,Lateral Malleolus o Change dressing every other day. Follow-up Appointments Wound #1 Sacrum o Return Appointment in 1 week. Wound #3 Right Back o Return Appointment in 1 week. Wound #6 Right,Lateral Malleolus o Return Appointment in 1 week. Off-Loading Wound #1 Sacrum o Mattress - Order mattress overlay o Turn and reposition every 2 hours  Wound #3 Right Back o Mattress - Order mattress overlay [name], Charles K. ([id number]) o Turn and reposition every 2 hours Wound #6 Right,Lateral Malleolus o Mattress - Order mattress overlay o Turn and reposition every 2 hours Additional Orders / Instructions Wound #1 Sacrum o Increase protein intake. Wound #3 Right Back o Increase protein intake. Wound #6 Right,Lateral Malleolus o Increase protein intake. Medications-please add to medication list. Wound #1 Sacrum o Other: - Vitamin C, Zinc, MVI Wound #3 Right Back o Other: - Vitamin C, Zinc, MVI Wound #6 Right,Lateral Malleolus o Other: - Vitamin C, Zinc, MVI Notes Continue with the current wound care orders per above and specifically I will continue the Santyl in regard to the sacral wound for the next week. Hopefully we will be able to get to a better granular bed on the right sacral region as well as hopefully this does not extend to bone. If anything worsens patient or family will contact our office for additional recommendations. Electronic Signature(s) Signed: 07/12/2016 4:36:33 PM By: Alric Quan Signed: 07/12/2016 7:35:55 PM By: Worthy Keeler PA-C Entered By: Alric Quan on 07/12/2016 16:00:46 [name], [name]  ([id number]) -------------------------------------------------------------------------------- Problem List Details Patient Name: [name] Date of Service: 07/12/2016 2:45 PM Medical Record Number: [id number] Patient Account Number: [id number] Date of Birth/Sex: 02-17-1940 (76 y.o. Male) Treating RN: Ahmed Prima Primary Care Provider: Johny Drilling Other Clinician: Referring Provider: Johny Drilling Treating Provider/Extender: Melburn Hake, Shariya Gaster Weeks in Treatment: 2 Active Problems ICD-10 Encounter Code Description Active Date Diagnosis L89.150 Pressure ulcer of sacral region, unstageable 06/28/2016 Yes S91.011D Laceration without foreign body, right ankle, subsequent 06/28/2016 Yes encounter S21.219D Laceration without foreign body of unspecified back wall 06/28/2016 Yes of thorax without penetration into thoracic cavity, subsequent encounter E11.622 Type 2 diabetes mellitus with other skin ulcer 06/28/2016 Yes Inactive Problems Resolved Problems Electronic Signature(s) Signed: 07/12/2016 7:35:55 PM By: Worthy Keeler PA-C Entered By: Worthy Keeler on 07/12/2016 15:18:51 [name], [name] ([id number]) -------------------------------------------------------------------------------- Progress Note Details Patient Name: [name] Date of Service: 07/12/2016 2:45 PM Medical Record Number: [id number] Patient Account Number: [id number] Date of Birth/Sex: 1940-02-08 (76 y.o. Male) Treating RN: Ahmed Prima Primary Care Provider: Johny Drilling Other Clinician: Referring Provider: Johny Drilling Treating Provider/Extender: Melburn Hake, Tuwanda Vokes Weeks in Treatment: 2 Subjective Chief Complaint Information obtained from Patient 06/28/16; patient is here accompanied by his daughter for review of multiple pressure ulcerations predominantly his lower sacrum/coccyx and surrounding buttock's History of Present Illness (HPI) 06/28/16; this is a 76 year old man who was admitted to  hospital from 6/13 through 06/18/16 at [facility] after being found down in a closet in his home. It is not exactly clear how long he was actually there although it was probably for days. His total CK maximum was at 2582. He was admitted with delirium, possibly heat related illness, possible subendocardial MI. An MRI of the brain was negative. During this hospitalization he was noted to have areas of pressure-related injury for the time he was spent on the floor on his back he was stated to have a stage II pressure ulcer over his buttocks. Multiple excoriations were also noted. He has advanced home care about doing physical therapy there requesting a nurse which seems reasonable. The patient is a diabetic on metformin. They're using bacitracin to all these areas. He was prescribed doxycycline which I believe he is still on. 07/05/16; patient with pressure ulcers on his right greater than left buttock surrounding his coccyx. Excoriation over the  right lateral ankle an excoriation on his upper back. They did not get any Santyl or medihoney. We will take care of this today. 07/12/16 unfortunately patient's wounds appeared to be doing better across the board and the eschar that is overlying the sacral wound has loosened up to where I could debride somewhat today. There's no evidence of Objective Constitutional Thin and well-hydrated in no acute distress. Vitals Time Taken: 2:50 PM, Height: 69 in, Weight: 147.5 lbs, BMI: 21.8, Temperature: 97.6 F, Pulse: 76 [name], Charles K. ([id number]) bpm, Respiratory Rate: 16 breaths/min, Blood Pressure: 121/51 mmHg. Respiratory normal breathing without difficulty. Psychiatric this patient is able to make decisions and demonstrates good insight into disease process. Alert and Oriented x 3. pleasant and cooperative. General Notes: In the region specifically patient sacral I was able to perform debridement today and in the left sacral region I was able to  expose a fairly good granular bed although there were still some areas of slough noted in regard to the right second region I was able to remove a significant layer of stick eschar/necrotic tissue that had previously been crosshatched though there is still a complete layer of slough covering the wound that was inherent and I was not able to remove through sharp debridement. Integumentary (Hair, Skin) Wound #1 status is Open. Original cause of wound was Pressure Injury. The wound is located on the Sacrum. The wound measures 3.7cm length x 6.1cm width x 0.1cm depth; 17.726cm^2 area and 1.773cm^3 volume. There is no tunneling or undermining noted. There is a large amount of serosanguineous drainage noted. The wound margin is distinct with the outline attached to the wound base. There is no granulation within the wound bed. There is a large (67-100%) amount of necrotic tissue within the wound bed including Eschar. The periwound skin appearance exhibited: Dry/Scaly, Rubor, Erythema. The surrounding wound skin color is noted with erythema which is circumferential. Periwound temperature was noted as No Abnormality. The periwound has tenderness on palpation. Wound #2 status is Open. Original cause of wound was Trauma. The wound is located on the Midline Back. The wound measures 0cm length x 0cm width x 0cm depth; 0cm^2 area and 0cm^3 volume. There is no tunneling or undermining noted. There is a none present amount of drainage noted. The wound margin is flat and intact. There is no granulation within the wound bed. There is no necrotic tissue within the wound bed. The periwound skin appearance did not exhibit: Dry/Scaly. Periwound temperature was noted as No Abnormality. The periwound has tenderness on palpation. Wound #3 status is Open. Original cause of wound was Trauma. The wound is located on the Right Back. The wound measures 0.2cm length x 1.2cm width x 0.1cm depth; 0.188cm^2 area and 0.019cm^3  volume. There is no tunneling or undermining noted. There is a large amount of serosanguineous drainage noted. The wound margin is flat and intact. There is no granulation within the wound bed. There is a large (67- 100%) amount of necrotic tissue within the wound bed including Eschar. Periwound temperature was noted as No Abnormality. The periwound has tenderness on palpation. Wound #6 status is Open. Original cause of wound was Trauma. The wound is located on the Right,Lateral Malleolus. The wound measures 0.4cm length x 0.3cm width x 0.1cm depth; 0.094cm^2 area and 0.009cm^3 volume. There is no tunneling or undermining noted. There is a large amount of serosanguineous drainage noted. The wound margin is flat and intact. There is no granulation within the wound bed. There is  a large (67-100%) amount of necrotic tissue within the wound bed including Adherent Slough. The periwound skin appearance exhibited: Maceration. Periwound temperature was noted as No Abnormality. The periwound has tenderness on palpation. [name], [name] ([id number]) Assessment Active Problems ICD-10 L89.150 - Pressure ulcer of sacral region, unstageable S91.011D - Laceration without foreign body, right ankle, subsequent encounter S21.219D - Laceration without foreign body of unspecified back wall of thorax without penetration into thoracic cavity, subsequent encounter E11.622 - Type 2 diabetes mellitus with other skin ulcer Procedures Wound #1 Pre-procedure diagnosis of Wound #1 is a Pressure Ulcer located on the Sacrum . There was a Skin/Subcutaneous Tissue Debridement ([id number]) debridement with total area of 22.57 sq cm performed by STONE III, Travonta Gill E., PA-C. with the following instrument(s): Curette to remove Viable and Non-Viable tissue/material including Exudate, Fibrin/Slough, and Subcutaneous after achieving pain control using Lidocaine 4% Topical Solution. A time out was conducted at 15:23, prior to the  start of the procedure. A Minimum amount of bleeding was controlled with Pressure. The procedure was tolerated well with a pain level of 0 throughout and a pain level of 0 following the procedure. Post Debridement Measurements: 3.7cm length x 6.1cm width x 0.1cm depth; 1.773cm^3 volume. Post debridement Stage noted as Category/Stage II. Character of Wound/Ulcer Post Debridement requires further debridement. Post procedure Diagnosis Wound #1: Same as Pre-Procedure Plan Wound Cleansing: Wound #1 Sacrum: Clean wound with Normal Saline. Anesthetic: Wound #1 Sacrum: Topical Lidocaine 4% cream applied to wound bed prior to debridement - for clinic use Wound #3 Right Back: Topical Lidocaine 4% cream applied to wound bed prior to debridement - for clinic use Wound #6 Right,Lateral Malleolus: [name], [name]. ([id number]) Topical Lidocaine 4% cream applied to wound bed prior to debridement - for clinic use Skin Barriers/Peri-Wound Care: Wound #1 Sacrum: Skin Prep Wound #3 Right Back: Skin Prep Wound #6 Right,Lateral Malleolus: Skin Prep Primary Wound Dressing: Wound #1 Sacrum: Santyl Ointment Medihoney gel - Manuka Honey over the counter Wound #3 Right Back: Other: - Bacitracin Other: - Bacitracin Wound #6 Right,Lateral Malleolus: Prisma Ag - moisten with saline Secondary Dressing: Wound #1 Sacrum: Dry Gauze Boardered Foam Dressing Wound #3 Right Back: Dry Gauze Boardered Foam Dressing Wound #6 Right,Lateral Malleolus: Dry Gauze Boardered Foam Dressing Dressing Change Frequency: Wound #1 Sacrum: Change dressing every other day. Wound #3 Right Back: Change dressing every other day. Wound #6 Right,Lateral Malleolus: Change dressing every other day. Follow-up Appointments: Wound #1 Sacrum: Return Appointment in 1 week. Wound #3 Right Back: Return Appointment in 1 week. Wound #6 Right,Lateral Malleolus: Return Appointment in 1 week. Off-Loading: Wound #1  Sacrum: Mattress - HHRN to order mattress overlay Turn and reposition every 2 hours Wound #3 Right Back: Mattress - HHRN to order mattress overlay Turn and reposition every 2 hours Wound #6 Right,Lateral Malleolus: Mattress - HHRN to order mattress overlay Turn and reposition every 2 hours [name], Charles K. ([id number]) Additional Orders / Instructions: Wound #1 Sacrum: Increase protein intake. Wound #3 Right Back: Increase protein intake. Wound #6 Right,Lateral Malleolus: Increase protein intake. Medications-please add to medication list.: Wound #1 Sacrum: Other: - Vitamin C, Zinc, MVI Wound #3 Right Back: Other: - Vitamin C, Zinc, MVI Wound #6 Right,Lateral Malleolus: Other: - Vitamin C, Zinc, MVI General Notes: Continue with the current wound care orders per above and specifically I will continue the Santyl in regard to the sacral wound for the next week. Hopefully we will be able to get to a better granular bed  on the right sacral region as well as hopefully this does not extend to bone. If anything worsens patient or family will contact our office for additional recommendations. Electronic Signature(s) Signed: 07/12/2016 7:35:55 PM By: Worthy Keeler PA-C Entered By: Worthy Keeler on 07/12/2016 15:53:57 Imhof, [name] ([id number]) -------------------------------------------------------------------------------- SuperBill Details Patient Name: [name] Date of Service: 07/12/2016 Medical Record Number: [id number] Patient Account Number: [id number] Date of Birth/Sex: 08-04-1940 (75 y.o. Male) Treating RN: Carolyne Fiscal, Debi Primary Care Provider: Johny Drilling Other Clinician: Referring Provider: Johny Drilling Treating Provider/Extender: Melburn Hake, [place] Duerst Weeks in Treatment: 2 Diagnosis Coding ICD-10 Codes Code Description L89.150 Pressure ulcer of sacral region, unstageable S91.011D Laceration without foreign body, right ankle, subsequent encounter Laceration  without foreign body of unspecified back wall of thorax without penetration into S21.219D thoracic cavity, subsequent encounter E11.622 Type 2 diabetes mellitus with other skin ulcer Facility Procedures CPT4 Code: [id number] Description: 96789 - DEB SUBQ TISSUE 20 SQ CM/< ICD-10 Description Diagnosis L89.150 Pressure ulcer of sacral region, unstageable Modifier: Quantity: 1 CPT4 Code: [id number] Description: 02585 - DEB SUBQ TISS EA ADDL 20CM ICD-10 Description Diagnosis L89.150 Pressure ulcer of sacral region, unstageable Modifier: Quantity: 1 Physician Procedures CPT4 Code: [id number] Description: 35361 - WC PHYS SUBQ TISS 20 SQ CM ICD-10 Description Diagnosis L89.150 Pressure ulcer of sacral region, unstageable Modifier: Quantity: 1 CPT4 Code: 4431540 Description: 08676 - WC PHYS SUBQ TISS EA ADDL 20 CM ICD-10 Description Diagnosis L89.150 Pressure ulcer of sacral region, unstageable Modifier: Quantity: 1 Electronic Signature(s) WEYLYN, RICCIUTI ([id number]) Signed: 07/12/2016 7:35:55 PM By: Worthy Keeler PA-C Entered By: Worthy Keeler on 07/12/2016 15:54:15

## 2016-07-20 ENCOUNTER — Encounter: Payer: Medicare Other | Admitting: Internal Medicine

## 2016-07-20 ENCOUNTER — Other Ambulatory Visit
Admission: RE | Admit: 2016-07-20 | Discharge: 2016-07-20 | Disposition: A | Discharge status: Home / Self Care | Payer: Medicare Other | Source: Ambulatory Visit | Attending: Family Medicine | Admitting: Family Medicine

## 2016-07-20 DIAGNOSIS — B998 Other infectious disease: Secondary | ICD-10-CM | POA: Insufficient documentation

## 2016-07-20 DIAGNOSIS — E11622 Type 2 diabetes mellitus with other skin ulcer: Secondary | ICD-10-CM | POA: Diagnosis not present

## 2016-07-21 NOTE — Progress Notes (Signed)
[name], [name] ([id number]) Visit Report for 07/20/2016 Arrival Information Details Patient Name: [name], [name] Date of Service: 07/20/2016 12:30 PM Medical Record Number: [id number] Patient Account Number: [id number] Date of Birth/Sex: [birth year] (76 y.o. Male) Treating RN: Montey Hora Primary Care Elyce Zollinger: Johny Drilling Other Clinician: Referring Darshan Solanki: Johny Drilling Treating Raeden Schippers/Extender: Tito Dine in Treatment: 3 Visit Information History Since Last Visit Added or deleted any medications: No Patient Arrived: [name] Any new allergies or adverse reactions: No Arrival Time: 12:42 Had a fall or experienced change in No Accompanied By: dtr activities of daily living that may affect Transfer Assistance: None risk of falls: Patient Identification Verified: Yes Signs or symptoms of abuse/neglect since last No Secondary Verification Process Completed: Yes visito Patient Requires Transmission-Based No Hospitalized since last visit: No Precautions: Has Dressing in Place as Prescribed: Yes Patient Has Alerts: Yes Pain Present Now: No Patient Alerts: DM II Electronic Signature(s) Signed: 07/20/2016 5:06:31 PM By: Montey Hora Entered By: Montey Hora on 07/20/2016 12:43:17 [name], [name] ([id number]) -------------------------------------------------------------------------------- Encounter Discharge Information Details Patient Name: [name] Date of Service: 07/20/2016 12:30 PM Medical Record Number: [id number] Patient Account Number: [id number] Date of Birth/Sex: [birth year] (75 y.o. Male) Treating RN: Montey Hora Primary Care Avalynn Bowe: Johny Drilling Other Clinician: Referring Imojean Yoshino: Johny Drilling Treating Leinaala Catanese/Extender: Tito Dine in Treatment: 3 Encounter Discharge Information Items Discharge Pain Level: 0 Discharge Condition: Stable Ambulatory Status: Cane Discharge Destination: Home Transportation: Private  Auto Accompanied By: dtr Schedule Follow-up Appointment: Yes Medication Reconciliation completed No and provided to Patient/Care Gloris Shiroma: Provided on Clinical Summary of Care: 07/20/2016 Form Type Recipient Paper Patient LM Electronic Signature(s) Signed: 07/20/2016 1:28:37 PM By: Ruthine Dose Entered By: Ruthine Dose on 07/20/2016 13:28:36 [name], [name] ([id number]) -------------------------------------------------------------------------------- Multi Wound Chart Details Patient Name: [name] Date of Service: 07/20/2016 12:30 PM Medical Record Number: [id number] Patient Account Number: [id number] Date of Birth/Sex: [birth year] (76 y.o. Male) Treating RN: Montey Hora Primary Care Jaydon Soroka: Johny Drilling Other Clinician: Referring Rushil Kimbrell: Johny Drilling Treating [name] [name]/Extender: Ricard Dillon Weeks in Treatment: 3 Vital Signs Height(in): 69 Pulse(bpm): 82 Weight(lbs): 147.5 Blood Pressure 102/50 (mmHg): Body Mass Index(BMI): 22 Temperature(F): 98.5 Respiratory Rate 16 (breaths/min): Photos: Wound Location: Sacrum Right Back Right, Lateral Malleolus Wounding Event: Pressure Injury Trauma Trauma Primary Etiology: Pressure Ulcer Trauma, Other Trauma, Other Date Acquired: 06/14/2016 06/14/2016 06/14/2016 Weeks of Treatment: 3 3 3  Wound Status: Open Open Open Clustered Wound: No Yes No Measurements L x W x D 3.7x5.9x0.1 0x0x0 0.1x0.1x0.1 (cm) Area (cm) : 17.145 0 0.008 Volume (cm) : 1.715 0 0.001 % Reduction in Area: 54.90% 100.00% 96.40% % Reduction in Volume: 77.40% 100.00% 95.50% Classification: Category/Stage IV Partial Thickness Partial Thickness Exudate Amount: Large N/A N/A Exudate Type: Serosanguineous N/A N/A Exudate Color: red, brown N/A N/A Wound Margin: Distinct, outline attached N/A N/A Granulation Amount: None Present (0%) N/A N/A Necrotic Amount: Large (67-100%) N/A N/A Necrotic Tissue: Eschar N/A N/A Exposed Structures: N/A  N/A [name], [name]. ([id number]) Fat Layer (Subcutaneous Tissue) Exposed: Yes Muscle: Yes Fascia: No Tendon: No Joint: No Bone: No Epithelialization: None N/A N/A Debridement: Debridement (35361- N/A N/A 11047) Pre-procedure 13:08 N/A N/A Verification/Time Out Taken: Pain Control: Lidocaine 4% Topical N/A N/A Solution Tissue Debrided: Necrotic/Eschar, N/A N/A Fibrin/Slough, Muscle, Subcutaneous Level: Skin/Subcutaneous N/A N/A Tissue Debridement Area (sq 21.83 N/A N/A cm): Instrument: Blade, Curette, Forceps N/A N/A Specimen: Swab N/A N/A Number of Specimens 1 N/A N/A Taken: Bleeding: Minimum N/A N/A Hemostasis Achieved:  Pressure N/A N/A Procedural Pain: 0 N/A N/A Post Procedural Pain: 0 N/A N/A Debridement Treatment Procedure was tolerated N/A N/A Response: well Post Debridement 3.7x5.9x0.4 N/A N/A Measurements L x W x D (cm) Post Debridement 6.858 N/A N/A Volume: (cm) Post Debridement Category/Stage IV N/A N/A Stage: Periwound Skin Texture: No Abnormalities Noted No Abnormalities Noted No Abnormalities Noted Periwound Skin Dry/Scaly: Yes No Abnormalities Noted No Abnormalities Noted Moisture: Periwound Skin Color: Erythema: Yes No Abnormalities Noted No Abnormalities Noted Rubor: Yes Erythema Location: Circumferential N/A N/A Temperature: No Abnormality N/A N/A Tenderness on Yes No No Palpation: Wound Preparation: Ulcer Cleansing: N/A N/A Rinsed/Irrigated with Messmer, Diaz K. ([id number]) Saline Topical Anesthetic Applied: Other: lidocaine 4% Procedures Performed: Debridement N/A N/A Treatment Notes Wound #1 (Sacrum) 1. Cleansed with: Clean wound with Normal Saline 2. Anesthetic Topical Lidocaine 4% cream to wound bed prior to debridement 4. Dressing Applied: Santyl Ointment 5. Secondary Dressing Applied Bordered Foam Dressing Dry Gauze Wound #6 (Right, Lateral Malleolus) 1. Cleansed with: Clean wound with Normal Saline 2.  Anesthetic Topical Lidocaine 4% cream to wound bed prior to debridement 4. Dressing Applied: Other dressing (specify in notes) Notes coverlet Electronic Signature(s) Signed: 07/20/2016 4:47:30 PM By: Linton Ham MD Entered By: Linton Ham on 07/20/2016 15:06:34 [place], [name] ([id number]) -------------------------------------------------------------------------------- Multi-Disciplinary Care Plan Details Patient Name: [name] Date of Service: 07/20/2016 12:30 PM Medical Record Number: [id number] Patient Account Number: [id number] Date of Birth/Sex: [birth year] (76 y.o. Male) Treating RN: Montey Hora Primary Care Kayde Warehime: Johny Drilling Other Clinician: Referring Mumtaz Lovins: Johny Drilling Treating Lavere Stork/Extender: Tito Dine in Treatment: 3 Active Inactive ` Abuse / Safety / Falls / Self Care Management Nursing Diagnoses: Potential for falls Goals: Patient will not experience any injury related to falls Date Initiated: 06/28/2016 Target Resolution Date: 10/08/2016 Goal Status: Active Interventions: Assess Activities of Daily Living upon admission and as needed Assess: immobility, friction, shearing, incontinence upon admission and as needed Assess impairment of mobility on admission and as needed per policy Notes: ` Nutrition Nursing Diagnoses: Imbalanced nutrition Impaired glucose control: actual or potential Potential for alteratiion in Nutrition/Potential for imbalanced nutrition Goals: Patient/caregiver agrees to and verbalizes understanding of need to use nutritional supplements and/or vitamins as prescribed Date Initiated: 06/28/2016 Target Resolution Date: 09/10/2016 Goal Status: Active Patient/caregiver verbalizes understanding of need to maintain therapeutic glucose control per primary care physician Date Initiated: 06/28/2016 Target Resolution Date: 09/10/2016 Goal Status: Active Interventions: MCADAMSAbdo, [name]  ([id number]) Assess patient nutrition upon admission and as needed per policy Notes: ` Orientation to the Wound Care Program Nursing Diagnoses: Knowledge deficit related to the wound healing center program Goals: Patient/caregiver will verbalize understanding of the [place] Date Initiated: 06/28/2016 Target Resolution Date: 07/09/2016 Goal Status: Active Interventions: Provide education on orientation to the wound center Notes: ` Pain, Acute or Chronic Nursing Diagnoses: Pain, acute or chronic: actual or potential Potential alteration in comfort, pain Goals: Patient/caregiver will verbalize adequate pain control between visits Date Initiated: 06/28/2016 Target Resolution Date: 10/08/2016 Goal Status: Active Interventions: Assess comfort goal upon admission Complete pain assessment as per visit requirements Notes: ` Pressure Nursing Diagnoses: Knowledge deficit related to causes and risk factors for pressure ulcer development Knowledge deficit related to management of pressures ulcers Potential for impaired tissue integrity related to pressure, friction, moisture, and shear Goals: Patient will remain free from development of additional pressure ulcers [name], [name] ([id number]) Date Initiated: 06/28/2016 Target Resolution Date: 10/08/2016 Goal Status: Active Interventions: Assess: immobility, friction,  shearing, incontinence upon admission and as needed Assess offloading mechanisms upon admission and as needed Notes: ` Wound/Skin Impairment Nursing Diagnoses: Impaired tissue integrity Knowledge deficit related to ulceration/compromised skin integrity Goals: Ulcer/skin breakdown will have a volume reduction of 80% by week 12 Date Initiated: 06/28/2016 Target Resolution Date: 10/01/2016 Goal Status: Active Interventions: Assess patient/caregiver ability to perform ulcer/skin care regimen upon admission and as needed Assess ulceration(s) every  visit Notes: Electronic Signature(s) Signed: 07/20/2016 5:06:31 PM By: Montey Hora Entered By: Montey Hora on 07/20/2016 13:08:51 [name], [name] ([id number]) -------------------------------------------------------------------------------- Pain Assessment Details Patient Name: [name] Date of Service: 07/20/2016 12:30 PM Medical Record Number: [id number] Patient Account Number: [id number] Date of Birth/Sex: 11/26/1940 (77 y.o. Male) Treating RN: Montey Hora Primary Care Konya Fauble: Johny Drilling Other Clinician: Referring Melizza Kanode: Johny Drilling Treating Alsie Younes/Extender: Ricard Dillon Weeks in Treatment: 3 Active Problems Location of Pain Severity and Description of Pain Patient Has Paino No Site Locations Pain Management and Medication Current Pain Management: Electronic Signature(s) Signed: 07/20/2016 5:06:31 PM By: Montey Hora Entered By: Montey Hora on 07/20/2016 12:43:23 [name], [name] ([id number]) -------------------------------------------------------------------------------- Patient/Caregiver Education Details Patient Name: [name] Date of Service: 07/20/2016 12:30 PM Medical Record Patient Account Number: [id number] Number: Treating RN: Montey Hora August 09, 1940 (75 y.o. Other Clinician: Date of Birth/Gender: Male) Treating ROBSON, [place] Primary Care Physician: Johny Drilling Physician/Extender: G Referring Physician: Nettie Elm in Treatment: 3 Education Assessment Education Provided To: Patient and Caregiver Education Topics Provided Wound/Skin Impairment: Handouts: Other: wound care as ordered Methods: Demonstration, Explain/Verbal Responses: State content correctly Electronic Signature(s) Signed: 07/20/2016 5:06:31 PM By: Montey Hora Entered By: Montey Hora on 07/20/2016 12:46:32 [place], [name]  ([id number]) -------------------------------------------------------------------------------- Wound Assessment Details Patient Name: [name] Date of Service: 07/20/2016 12:30 PM Medical Record Number: [id number] Patient Account Number: [id number] Date of Birth/Sex: [birth year] (76 y.o. Male) Treating RN: Montey Hora Primary Care Ronya Gilcrest: Johny Drilling Other Clinician: Referring Donaven Criswell: Johny Drilling Treating Shanikqua Zarzycki/Extender: Ricard Dillon Weeks in Treatment: 3 Wound Status Wound Number: 1 Primary Etiology: Pressure Ulcer Wound Location: Sacrum Wound Status: Open Wounding Event: Pressure Injury Date Acquired: 06/14/2016 Weeks Of Treatment: 3 Clustered Wound: No Photos Photo Uploaded By: Montey Hora on 07/20/2016 14:06:02 Wound Measurements Length: (cm) 3.7 Width: (cm) 5.9 Depth: (cm) 0.1 Area: (cm) 17.145 Volume: (cm) 1.715 % Reduction in Area: 54.9% % Reduction in Volume: 77.4% Epithelialization: None Tunneling: No Undermining: No Wound Description Classification: Category/Stage IV Wound Margin: Distinct, outline attached Exudate Amount: Large Exudate Type: Serosanguineous Exudate Color: red, brown Foul Odor After Cleansing: No Slough/Fibrino Yes Wound Bed Granulation Amount: None Present (0%) Exposed Structure Necrotic Amount: Large (67-100%) Fascia Exposed: No Necrotic Quality: Eschar Fat Layer (Subcutaneous Tissue) Exposed: Yes Tendon Exposed: No [name], Charles K. ([id number]) Muscle Exposed: Yes Necrosis of Muscle: Yes Joint Exposed: No Bone Exposed: No Periwound Skin Texture Texture Color No Abnormalities Noted: No No Abnormalities Noted: No Erythema: Yes Moisture Erythema Location: Circumferential No Abnormalities Noted: No Rubor: Yes Dry / Scaly: Yes Temperature / Pain Temperature: No Abnormality Tenderness on Palpation: Yes Wound Preparation Ulcer Cleansing: Rinsed/Irrigated with Saline Topical Anesthetic  Applied: Other: lidocaine 4%, Treatment Notes Wound #1 (Sacrum) 1. Cleansed with: Clean wound with Normal Saline 2. Anesthetic Topical Lidocaine 4% cream to wound bed prior to debridement 4. Dressing Applied: Santyl Ointment 5. Secondary Dressing Applied Bordered Foam Dressing Dry Gauze Electronic Signature(s) Signed: 07/20/2016 5:06:31 PM By: Montey Hora Entered By: Montey Hora on 07/20/2016 13:14:01 Sferrazza, Arrie K. (  [id number]) -------------------------------------------------------------------------------- Wound Assessment Details Patient Name: [name], [name] Date of Service: 07/20/2016 12:30 PM Medical Record Number: [id number] Patient Account Number: [id number] Date of Birth/Sex: August 01, 1940 (76 y.o. Male) Treating RN: Montey Hora Primary Care Artrell Lawless: Johny Drilling Other Clinician: Referring Adeeb Konecny: Johny Drilling Treating Aryelle Figg/Extender: Tito Dine in Treatment: 3 Wound Status Wound Number: 3 Primary Etiology: Trauma, Other Wound Location: Right Back Wound Status: Open Wounding Event: Trauma Date Acquired: 06/14/2016 Weeks Of Treatment: 3 Clustered Wound: Yes Photos Photo Uploaded By: Montey Hora on 07/20/2016 14:06:02 Wound Measurements Length: (cm) 0 % Reduction in Width: (cm) 0 % Reduction in Depth: (cm) 0 Area: (cm) 0 Volume: (cm) 0 Area: 100% Volume: 100% Wound Description Classification: Partial Thickness Periwound Skin Texture Texture Color No Abnormalities Noted: No No Abnormalities Noted: No Moisture No Abnormalities Noted: No Electronic Signature(s) Signed: 07/20/2016 5:06:31 PM By: Joellyn Rued, [name] ([id number]) Entered By: Montey Hora on 07/20/2016 12:52:33 [place], [name] ([id number]) -------------------------------------------------------------------------------- Wound Assessment Details Patient Name: [name] Date of Service: 07/20/2016 12:30 PM Medical Record Number:  [id number] Patient Account Number: [id number] Date of Birth/Sex: [birth year] (76 y.o. Male) Treating RN: Montey Hora Primary Care Teliah Buffalo: Johny Drilling Other Clinician: Referring Babe Anthis: Johny Drilling Treating Kahlani Graber/Extender: Ricard Dillon Weeks in Treatment: 3 Wound Status Wound Number: 6 Primary Etiology: Trauma, Other Wound Location: Right, Lateral Malleolus Wound Status: Open Wounding Event: Trauma Date Acquired: 06/14/2016 Weeks Of Treatment: 3 Clustered Wound: No Photos Photo Uploaded By: Montey Hora on 07/20/2016 14:06:19 Wound Measurements Length: (cm) 0.1 Width: (cm) 0.1 Depth: (cm) 0.1 Area: (cm) 0.008 Volume: (cm) 0.001 % Reduction in Area: 96.4% % Reduction in Volume: 95.5% Wound Description Classification: Partial Thickness Periwound Skin Texture Texture Color No Abnormalities Noted: No No Abnormalities Noted: No Moisture No Abnormalities Noted: No Treatment Notes Wound #6 (Right, Lateral Malleolus) 1. Cleansed with: Grills, Carver K. ([id number]) Clean wound with Normal Saline 2. Anesthetic Topical Lidocaine 4% cream to wound bed prior to debridement 4. Dressing Applied: Other dressing (specify in notes) Notes coverlet Electronic Signature(s) Signed: 07/20/2016 5:06:31 PM By: Montey Hora Entered By: Montey Hora on 07/20/2016 12:52:33 [place], [name] ([id number]) -------------------------------------------------------------------------------- Vitals Details Patient Name: [name] Date of Service: 07/20/2016 12:30 PM Medical Record Number: [id number] Patient Account Number: [id number] Date of Birth/Sex: Mar 19, 1940 (76 y.o. Male) Treating RN: Montey Hora Primary Care Alexandar Weisenberger: Johny Drilling Other Clinician: Referring Brinkley Peet: Johny Drilling Treating Amiya Escamilla/Extender: Tito Dine in Treatment: 3 Vital Signs Time Taken: 12:44 Temperature (F): 98.5 Height (in): 69 Pulse (bpm): 82 Weight (lbs):  147.5 Respiratory Rate (breaths/min): 16 Body Mass Index (BMI): 21.8 Blood Pressure (mmHg): 102/50 Reference Range: 80 - 120 mg / dl Electronic Signature(s) Signed: 07/20/2016 5:06:31 PM By: Montey Hora Entered By: Montey Hora on 07/20/2016 12:45:29

## 2016-07-21 NOTE — Progress Notes (Signed)
[name], [name] ([id number]) Visit Report for 07/20/2016 Chief Complaint Document Details Patient Name: [name], [name] Date of Service: 07/20/2016 12:30 PM Medical Record Patient Account Number: [id number] Number: Treating RN: Montey Hora [birth year] (76 y.o. Other Clinician: Date of Birth/Sex: Male) Treating Cas Tracz Primary Care Provider: Johny Drilling Provider/Extender: G Referring Provider: Nettie Elm in Treatment: 3 Information Obtained from: Patient Chief Complaint 06/28/16; patient is here accompanied by his daughter for review of multiple pressure ulcerations predominantly his lower sacrum/coccyx and surrounding buttock's Electronic Signature(s) Signed: 07/20/2016 4:47:30 PM By: Linton Ham MD Entered By: Linton Ham on 07/20/2016 15:08:23 [name], [name] ([id number]) -------------------------------------------------------------------------------- Debridement Details Patient Name: [name] Date of Service: 07/20/2016 12:30 PM Medical Record Patient Account Number: [id number] Number: Treating RN: Montey Hora 02/02/1940 (76 y.o. Other Clinician: Date of Birth/Sex: Male) Treating Ia Leeb Primary Care Provider: Johny Drilling Provider/Extender: G Referring Provider: Nettie Elm in Treatment: 3 Debridement Performed for Wound #1 Sacrum Assessment: Performed By: Physician Ricard Dillon, MD Debridement: Debridement Pre-procedure Verification/Time Out Yes - 13:08 Taken: Start Time: 13:08 Pain Control: Lidocaine 4% Topical Solution Level: Skin/Subcutaneous Tissue/Muscle Total Area Debrided (L x 3.7 (cm) x 5.9 (cm) = 21.83 (cm) W): Tissue and other Viable, Non-Viable, Eschar, Fibrin/Slough, Muscle, Subcutaneous material debrided: Instrument: Blade, Curette, Forceps Specimen: Swab Number of Specimens 1 Taken: Bleeding: Minimum Hemostasis Achieved: Pressure End Time:  13:12 Procedural Pain: 0 Post Procedural Pain: 0 Response to Treatment: Procedure was tolerated well Post Debridement Measurements of Total Wound Length: (cm) 3.7 Stage: Category/Stage IV Width: (cm) 5.9 Depth: (cm) 0.4 Volume: (cm) 6.858 Character of Wound/Ulcer Post Improved Debridement: Post Procedure Diagnosis Same as Pre-procedure Electronic Signature(s) [name], [name] ([id number]) Signed: 07/20/2016 4:47:30 PM By: Linton Ham MD Signed: 07/20/2016 5:06:31 PM By: Montey Hora Entered By: Linton Ham on 07/20/2016 15:08:11 [name], [name] ([id number]) -------------------------------------------------------------------------------- HPI Details Patient Name: [name] Date of Service: 07/20/2016 12:30 PM Medical Record Patient Account Number: [id number] Number: Treating RN: Montey Hora 1940-06-14 (76 y.o. Other Clinician: Date of Birth/Sex: Male) Treating Bentlie Withem Primary Care Provider: Johny Drilling Provider/Extender: G Referring Provider: Nettie Elm in Treatment: 3 History of Present Illness HPI Description: 06/28/16; this is a 76 year old man who was admitted to hospital from 6/13 through 06/18/16 at [facility] after being found down in a closet in his home. It is not exactly clear how long he was actually there although it was probably for days. His total CK maximum was at 2582. He was admitted with delirium, possibly heat related illness, possible subendocardial MI. An MRI of the brain was negative. During this hospitalization he was noted to have areas of pressure-related injury for the time he was spent on the floor on his back he was stated to have a stage II pressure ulcer over his buttocks. Multiple excoriations were also noted. He has advanced home care about doing physical therapy there requesting a nurse which seems reasonable. The patient is a diabetic on metformin. They're using bacitracin to all these  areas. He was prescribed doxycycline which I believe he is still on. 07/05/16; patient with pressure ulcers on his right greater than left buttock surrounding his coccyx. Excoriation over the right lateral ankle an excoriation on his upper back. They did not get any Santyl or medihoney. We will take care of this today. 07/12/16 unfortunately patient's wounds appeared to be doing better across the board and the eschar that is overlying the sacral wound has  loosened up to where I could debride somewhat today. There's no evidence of 07/20/16; butterfly shaped wound across the sacrum and the surrounding skin and soft tissue. The area on the right is much larger than the left. The area on the left looks healthy on the right still and necrotic nonviable surface. Using [employer]) Signed: 07/20/2016 4:47:30 PM By: Linton Ham MD Entered By: Linton Ham on 07/20/2016 15:10:36 [name], [name] ([id number]) -------------------------------------------------------------------------------- Physical Exam Details Patient Name: [name] Date of Service: 07/20/2016 12:30 PM Medical Record Patient Account Number: [id number] Number: Treating RN: Montey Hora 03/10/1940 (76 y.o. Other Clinician: Date of Birth/Sex: Male) Treating Legrand Lasser Primary Care Provider: Johny Drilling Provider/Extender: G Referring Provider: Nettie Elm in Treatment: 3 Constitutional Sitting or standing Blood Pressure is within target range for patient.. Pulse regular and within target range for patient.Marland Kitchen Respirations regular, non-labored and within target range.. Temperature is normal and within the target range for the patient.Marland Kitchen appears in no distress. Eyes Conjunctivae clear. No discharge. Notes Wound exam; the area on the left side of this wound looks healthy and small. The area on the right is malodorous deep and necrotic. Using a #5 curet I remove surface necrotic  material and then when I could open this site used pickups and scalpel and removed and more copious amounts of necrotic material. Because of the odor a culture was done. Even after reasonably extensive debridement in this area there is no viable surface here. Electronic Signature(s) Signed: 07/20/2016 4:47:30 PM By: Linton Ham MD Entered By: Linton Ham on 07/20/2016 15:12:33 [name], [name] ([id number]) -------------------------------------------------------------------------------- Physician Orders Details Patient Name: [name] Date of Service: 07/20/2016 12:30 PM Medical Record Patient Account Number: [id number] Number: Treating RN: Montey Hora 1940-04-07 (75 y.o. Other Clinician: Date of Birth/Sex: Male) Treating [place] Dod[place] Primary Care Provider: Johny Drilling Provider/Extender: G Referring Provider: Nettie Elm in Treatment: 3 Verbal / Phone Orders: No Diagnosis Coding Wound Cleansing Wound #1 Sacrum o Clean wound with Normal Saline. Anesthetic Wound #1 Sacrum o Topical Lidocaine 4% cream applied to wound bed prior to debridement - for clinic use Wound #6 Right,Lateral Malleolus o Topical Lidocaine 4% cream applied to wound bed prior to debridement - for clinic use Skin Barriers/Peri-Wound Care Wound #1 Sacrum o Skin Prep Wound #6 Right,Lateral Malleolus o Skin Prep Primary Wound Dressing Wound #1 Sacrum o Santyl Ointment Secondary Dressing Wound #1 Sacrum o Dry Gauze o Boardered Foam Dressing Wound #6 Right,Lateral Malleolus o Other - bandaid Dressing Change Frequency Wound #1 Sacrum o Change dressing every other day. [name], Charles K. ([id number]) Wound #6 Right,Lateral Malleolus o Change dressing every other day. Follow-up Appointments Wound #1 Sacrum o Return Appointment in 1 week. Wound #6 Right,Lateral Malleolus o Return Appointment in 1 week. Off-Loading Wound #1 Sacrum o  Mattress - Order mattress overlay o Turn and reposition every 2 hours Wound #6 Right,Lateral Malleolus o Mattress - Order mattress overlay o Turn and reposition every 2 hours Additional Orders / Instructions Wound #1 Sacrum o Increase protein intake. Wound #6 Right,Lateral Malleolus o Increase protein intake. Medications-please add to medication list. Wound #1 Sacrum o Other: - Vitamin C, Zinc, MVI Wound #6 Right,Lateral Malleolus o Other: - Vitamin C, Zinc, MVI Laboratory o Bacteria identified in Wound by Culture (MICRO) - sacrum oooo LOINC Code: 5809-9 oooo Convenience Name: Wound culture routine Electronic Signature(s) Signed: 07/20/2016 4:47:30 PM By: Linton Ham MD Signed: 07/20/2016 5:06:31 PM By: Montey Hora Entered  By: Montey Hora on 07/20/2016 13:33:33 Paglia, [name] ([id number]) [name], [name] ([id number]) -------------------------------------------------------------------------------- Problem List Details Patient Name: [name] Date of Service: 07/20/2016 12:30 PM Medical Record Patient Account Number: [id number] Number: Treating RN: Montey Hora [birth year] (75 y.o. Other Clinician: Date of Birth/Sex: Male) Treating Adrieanna Boteler Primary Care Provider: Johny Drilling Provider/Extender: G Referring Provider: Nettie Elm in Treatment: 3 Active Problems ICD-10 Encounter Code Description Active Date Diagnosis L89.150 Pressure ulcer of sacral region, unstageable 06/28/2016 Yes S91.011D Laceration without foreign body, right ankle, subsequent 06/28/2016 Yes encounter S21.219D Laceration without foreign body of unspecified back wall 06/28/2016 Yes of thorax without penetration into thoracic cavity, subsequent encounter E11.622 Type 2 diabetes mellitus with other skin ulcer 06/28/2016 Yes Inactive Problems Resolved Problems Electronic Signature(s) Signed: 07/20/2016 4:47:30 PM By: Linton Ham MD Entered  By: Linton Ham on 07/20/2016 15:06:23 [place], [name] ([id number]) -------------------------------------------------------------------------------- Progress Note Details Patient Name: [name] Date of Service: 07/20/2016 12:30 PM Medical Record Patient Account Number: [id number] Number: Treating RN: Montey Hora 1940/12/16 (75 y.o. Other Clinician: Date of Birth/Sex: Male) Treating Reesha Debes Primary Care Provider: Johny Drilling Provider/Extender: G Referring Provider: Nettie Elm in Treatment: 3 Subjective Chief Complaint Information obtained from Patient 06/28/16; patient is here accompanied by his daughter for review of multiple pressure ulcerations predominantly his lower sacrum/coccyx and surrounding buttock's History of Present Illness (HPI) 06/28/16; this is a 76 year old man who was admitted to hospital from 6/13 through 06/18/16 at [facility] after being found down in a closet in his home. It is not exactly clear how long he was actually there although it was probably for days. His total CK maximum was at 2582. He was admitted with delirium, possibly heat related illness, possible subendocardial MI. An MRI of the brain was negative. During this hospitalization he was noted to have areas of pressure-related injury for the time he was spent on the floor on his back he was stated to have a stage II pressure ulcer over his buttocks. Multiple excoriations were also noted. He has advanced home care about doing physical therapy there requesting a nurse which seems reasonable. The patient is a diabetic on metformin. They're using bacitracin to all these areas. He was prescribed doxycycline which I believe he is still on. 07/05/16; patient with pressure ulcers on his right greater than left buttock surrounding his coccyx. Excoriation over the right lateral ankle an excoriation on his upper back. They did not get any Santyl or medihoney.  We will take care of this today. 07/12/16 unfortunately patient's wounds appeared to be doing better across the board and the eschar that is overlying the sacral wound has loosened up to where I could debride somewhat today. There's no evidence of 07/20/16; butterfly shaped wound across the sacrum and the surrounding skin and soft tissue. The area on the right is much larger than the left. The area on the left looks healthy on the right still and necrotic nonviable surface. Using Santyl Objective [name], Myson K. ([id number]) Constitutional Sitting or standing Blood Pressure is within target range for patient.. Pulse regular and within target range for patient.Marland Kitchen Respirations regular, non-labored and within target range.. Temperature is normal and within the target range for the patient.Marland Kitchen appears in no distress. Vitals Time Taken: 12:44 PM, Height: 69 in, Weight: 147.5 lbs, BMI: 21.8, Temperature: 98.5 F, Pulse: 82 bpm, Respiratory Rate: 16 breaths/min, Blood Pressure: 102/50 mmHg. Eyes Conjunctivae clear. No discharge. General Notes: Wound exam; the area  on the left side of this wound looks healthy and small. The area on the right is malodorous deep and necrotic. Using a #5 curet I remove surface necrotic material and then when I could open this site used pickups and scalpel and removed and more copious amounts of necrotic material. Because of the odor a culture was done. Even after reasonably extensive debridement in this area there is no viable surface here. Integumentary (Hair, Skin) Wound #1 status is Open. Original cause of wound was Pressure Injury. The wound is located on the Sacrum. The wound measures 3.7cm length x 5.9cm width x 0.1cm depth; 17.145cm^2 area and 1.715cm^3 volume. There is muscle and Fat Layer (Subcutaneous Tissue) Exposed exposed. There is no tunneling or undermining noted. There is a large amount of serosanguineous drainage noted. The wound margin is distinct  with the outline attached to the wound base. There is no granulation within the wound bed. There is a large (67-100%) amount of necrotic tissue within the wound bed including Eschar and Necrosis of Muscle. The periwound skin appearance exhibited: Dry/Scaly, Rubor, Erythema. The surrounding wound skin color is noted with erythema which is circumferential. Periwound temperature was noted as No Abnormality. The periwound has tenderness on palpation. Wound #3 status is Open. Original cause of wound was Trauma. The wound is located on the Right Back. The wound measures 0cm length x 0cm width x 0cm depth; 0cm^2 area and 0cm^3 volume. Wound #6 status is Open. Original cause of wound was Trauma. The wound is located on the Right,Lateral Malleolus. The wound measures 0.1cm length x 0.1cm width x 0.1cm depth; 0.008cm^2 area and 0.001cm^3 volume. Assessment Active Problems ICD-10 L89.150 - Pressure ulcer of sacral region, unstageable S91.011D - Laceration without foreign body, right ankle, subsequent encounter S21.219D - Laceration without foreign body of unspecified back wall of thorax without penetration into thoracic cavity, subsequent encounter [name], Charles K. ([id number]) E11.622 - Type 2 diabetes mellitus with other skin ulcer Procedures Wound #1 Pre-procedure diagnosis of Wound #1 is a Pressure Ulcer located on the Sacrum . There was a Skin/Subcutaneous Tissue/Muscle Debridement ([id number]) debridement with total area of 21.83 sq cm performed by Ricard Dillon, MD. with the following instrument(s): Blade, Curette, and Forceps to remove Viable and Non-Viable tissue/material including Fibrin/Slough, Muscle, Eschar, and Subcutaneous after achieving pain control using Lidocaine 4% Topical Solution. 1 Specimen was taken by a Swab and sent to the lab per facility protocol.A time out was conducted at 13:08, prior to the start of the procedure. A Minimum amount of bleeding was controlled with  Pressure. The procedure was tolerated well with a pain level of 0 throughout and a pain level of 0 following the procedure. Post Debridement Measurements: 3.7cm length x 5.9cm width x 0.4cm depth; 6.858cm^3 volume. Post debridement Stage noted as Category/Stage IV. Character of Wound/Ulcer Post Debridement is improved. Post procedure Diagnosis Wound #1: Same as Pre-Procedure Plan Wound Cleansing: Wound #1 Sacrum: Clean wound with Normal Saline. Anesthetic: Wound #1 Sacrum: Topical Lidocaine 4% cream applied to wound bed prior to debridement - for clinic use Wound #6 Right,Lateral Malleolus: Topical Lidocaine 4% cream applied to wound bed prior to debridement - for clinic use Skin Barriers/Peri-Wound Care: Wound #1 Sacrum: Skin Prep Wound #6 Right,Lateral Malleolus: Skin Prep Primary Wound Dressing: Wound #1 Sacrum: Santyl Ointment Secondary Dressing: Wound #1 Sacrum: Dry Gauze Boardered Foam Dressing [name], Charles K. ([id number]) Wound #6 Right,Lateral Malleolus: Other - bandaid Dressing Change Frequency: Wound #1 Sacrum: Change dressing every other day.  Wound #6 Right,Lateral Malleolus: Change dressing every other day. Follow-up Appointments: Wound #1 Sacrum: Return Appointment in 1 week. Wound #6 Right,Lateral Malleolus: Return Appointment in 1 week. Off-Loading: Wound #1 Sacrum: Mattress - Order mattress overlay Turn and reposition every 2 hours Wound #6 Right,Lateral Malleolus: Mattress - Order mattress overlay Turn and reposition every 2 hours Additional Orders / Instructions: Wound #1 Sacrum: Increase protein intake. Wound #6 Right,Lateral Malleolus: Increase protein intake. Medications-please add to medication list.: Wound #1 Sacrum: Other: - Vitamin C, Zinc, MVI Wound #6 Right,Lateral Malleolus: Other: - Vitamin C, Zinc, MVI Laboratory ordered were: Wound culture routine - sacrum o #1 after an extensive debridement there is still a necrotic surface  here. This is an unstageable wound in this area but I wouldn't be surprised if this is a stage IV wound. #2 after removing several layers of necrotic material I did a culture of this but I did not provide empiric antibiotics at this point #3 emphasized pressure relief to the patient SHARIEF, [name] ([id number]) #4 continue the Santyl based dressings Electronic Signature(s) Signed: 07/20/2016 4:47:30 PM By: Linton Ham MD Entered By: Linton Ham on 07/20/2016 15:14:35 Roddy, [name] ([id number]) -------------------------------------------------------------------------------- SuperBill Details Patient Name: [name] Date of Service: 07/20/2016 Medical Record Patient Account Number: [id number] Number: Treating RN: Montey Hora Jun 23, 1940 (75 y.o. Other Clinician: Date of Birth/Sex: Male) Treating Anysha Frappier Primary Care Provider: Johny Drilling Provider/Extender: G Referring Provider: Nettie Elm in Treatment: 3 Diagnosis Coding ICD-10 Codes Code Description L89.150 Pressure ulcer of sacral region, unstageable S91.011D Laceration without foreign body, right ankle, subsequent encounter Laceration without foreign body of unspecified back wall of thorax without penetration into S21.219D thoracic cavity, subsequent encounter E11.622 Type 2 diabetes mellitus with other skin ulcer Facility Procedures CPT4 Code: [id number] Description: 16384 - DEB MUSC/FASCIA 20 SQ CM/< ICD-10 Description Diagnosis L89.150 Pressure ulcer of sacral region, unstageable E11.622 Type 2 diabetes mellitus with other skin ulcer Modifier: Quantity: 1 CPT4 Code: [id number] Description: 11046 - DEB MUSC/FASCIA EA ADDL 20 CM ICD-10 Description Diagnosis L89.150 Pressure ulcer of sacral region, unstageable Modifier: Quantity: 1 Physician Procedures CPT4 Code: [id number] Description: 11043 - WC PHYS DEBR MUSCLE/FASCIA 20 SQ CM ICD-10 Description Diagnosis L89.150 Pressure  ulcer of sacral region, unstageable E11.622 Type 2 diabetes mellitus with other skin ulcer Modifier: Quantity: 1 CPT4 Code: [id number] [name], Charles Description: 11046 - WC PHYS DEB MUSC/FASC EA ADDL 20 CM ICD-10 Description Diagnosis L89.150 Pressure ulcer of sacral region, [name] ([id number]) Modifier: Quantity: 1 Electronic Signature(s) Signed: 07/20/2016 4:47:30 PM By: Linton Ham MD Entered By: Linton Ham on 07/20/2016 15:15:23

## 2016-07-23 LAB — AEROBIC CULTURE  (SUPERFICIAL SPECIMEN)

## 2016-07-23 LAB — AEROBIC CULTURE W GRAM STAIN (SUPERFICIAL SPECIMEN)

## 2016-07-26 ENCOUNTER — Encounter: Payer: Medicare Other | Admitting: Internal Medicine

## 2016-07-26 DIAGNOSIS — E11622 Type 2 diabetes mellitus with other skin ulcer: Secondary | ICD-10-CM | POA: Diagnosis not present

## 2016-07-27 NOTE — Progress Notes (Signed)
BECKEM, TOMBERLIN ([id number]) Visit Report for 07/26/2016 Arrival Information Details Patient Name: [name], [name] Date of Service: 07/26/2016 3:30 PM Medical Record Number: [id number] Patient Account Number: [id number] Date of Birth/Sex: 10/17/1940 (75 y.o. Male) Treating RN: Cornell Barman Primary Care Kenroy Timberman: Johny Drilling Other Clinician: Referring Sherin Murdoch: Johny Drilling Treating Kyndal Gloster/Extender: Tito Dine in Treatment: 4 Visit Information History Since Last Visit Added or deleted any medications: No Patient Arrived: [name] Any new allergies or adverse reactions: No Arrival Time: 15:09 Had a fall or experienced change in No Accompanied By: daughter activities of daily living that may affect Transfer Assistance: None risk of falls: Patient Identification Verified: Yes Signs or symptoms of abuse/neglect since last No Secondary Verification Process Yes visito Completed: Hospitalized since last visit: No Patient Requires Transmission-Based No Has Dressing in Place as Prescribed: Yes Precautions: Pain Present Now: No Patient Has Alerts: Yes Patient Alerts: DM II Electronic Signature(s) Signed: 07/26/2016 4:25:40 PM By: Gretta Cool, BSN, RN, CWS, Kim RN, BSN Entered By: Gretta Cool, BSN, RN, CWS, Kim on 07/26/2016 15:09:58 [place], [name] ([id number]) -------------------------------------------------------------------------------- Encounter Discharge Information Details Patient Name: [name] Date of Service: 07/26/2016 3:30 PM Medical Record Number: [id number] Patient Account Number: [id number] Date of Birth/Sex: February 02, 1940 (75 y.o. Male) Treating RN: Cornell Barman Primary Care Hanna Aultman: Johny Drilling Other Clinician: Referring Dayln Tugwell: Johny Drilling Treating Jenya Putz/Extender: Tito Dine in Treatment: 4 Encounter Discharge Information Items Discharge Pain Level: 0 Discharge Condition: Stable Ambulatory Status: Ambulatory Discharge Destination:  Home Transportation: Private Auto Accompanied By: daughter Schedule Follow-up Appointment: Yes Medication Reconciliation completed and provided to Patient/Care Yes Namira Rosekrans: Provided on Clinical Summary of Care: 07/26/2016 Form Type Recipient Paper Patient LM Electronic Signature(s) Signed: 07/26/2016 4:25:40 PM By: Gretta Cool, BSN, RN, CWS, Kim RN, BSN Previous Signature: 07/26/2016 3:58:24 PM Version By: Ruthine Dose Entered By: Gretta Cool, BSN, RN, CWS, Kim on 07/26/2016 15:59:17 [place], [name] ([id number]) -------------------------------------------------------------------------------- Lower Extremity Assessment Details Patient Name: [name] Date of Service: 07/26/2016 3:30 PM Medical Record Number: [id number] Patient Account Number: [id number] Date of Birth/Sex: 1940-08-13 (75 y.o. Male) Treating RN: Cornell Barman Primary Care Rayan Dyal: Johny Drilling Other Clinician: Referring Tisheena Maguire: Johny Drilling Treating Tracy Gerken/Extender: Ricard Dillon Weeks in Treatment: 4 Vascular Assessment Pulses: Dorsalis Pedis Palpable: [Right:Yes] Posterior Tibial Extremity colors, hair growth, and conditions: Extremity Color: [Right:Normal] Hair Growth on Extremity: [Right:Yes] Temperature of Extremity: [Right:Warm] Capillary Refill: [Right:< 3 seconds] Electronic Signature(s) Signed: 07/26/2016 4:25:40 PM By: Gretta Cool, BSN, RN, CWS, Kim RN, BSN Entered By: Gretta Cool, BSN, RN, CWS, Kim on 07/26/2016 15:37:13 [place], [name] ([id number]) -------------------------------------------------------------------------------- Multi Wound Chart Details Patient Name: [name] Date of Service: 07/26/2016 3:30 PM Medical Record Number: [id number] Patient Account Number: [id number] Date of Birth/Sex: 10/20/1940 (76 y.o. Male) Treating RN: Cornell Barman Primary Care Sherie Dobrowolski: Johny Drilling Other Clinician: Referring Jannely Henthorn: Johny Drilling Treating Chrislyn Seedorf/Extender: Ricard Dillon Weeks in  Treatment: 4 Vital Signs Height(in): 69 Pulse(bpm): 78 Weight(lbs): 147.5 Blood Pressure 120/68 (mmHg): Body Mass Index(BMI): 22 Temperature(F): 98.1 Respiratory Rate 16 (breaths/min): Photos: [1:No Photos] [6:No Photos] [7:No Photos] Wound Location: [1:Sacrum] [6:Right, Lateral Malleolus] [7:Left Sacrum] Wounding Event: [1:Pressure Injury] [6:Trauma] [7:Pressure Injury] Primary Etiology: [1:Pressure Ulcer] [6:Trauma, Other] [7:Pressure Ulcer] Date Acquired: [1:06/14/2016] [6:06/14/2016] [7:07/03/2016] Weeks of Treatment: [1:4] [6:4] [7:0] Wound Status: [1:Open] [6:Healed - Epithelialized] [7:Open] Measurements L x W x D 3x2.8x1 [6:0x0x0] [7:1x1x0.1] (cm) Area (cm) : [1:6.597] [6:0] [7:0.785] Volume (cm) : [1:6.597] [6:0] [7:0.079] % Reduction in Area: [1:82.60%] [6:100.00%] [7:N/A] % Reduction in  Volume: 13.20% [6:100.00%] [7:N/A] Position 1 (o'clock): 3 Maximum Distance 1 1.8 (cm): Tunneling: [1:Yes] [6:N/A] [7:No] Classification: [1:Category/Stage IV] [6:Partial Thickness] [7:Category/Stage II] Exudate Amount: [1:Large] [6:N/A] [7:Small] Exudate Type: [1:Serosanguineous] [6:N/A] [7:Serous] Exudate Color: [1:red, brown] [6:N/A] [7:amber] Wound Margin: [1:Distinct, outline attached] [6:N/A] [7:Flat and Intact] Granulation Amount: [1:None Present (0%)] [6:N/A] [7:Small (1-33%)] Granulation Quality: [1:N/A] [6:N/A] [7:Red] Necrotic Amount: [1:Large (67-100%)] [6:N/A] [7:Small (1-33%)] Necrotic Tissue: [1:Eschar] [6:N/A] [7:N/A] Exposed Structures: [1:Fat Layer (Subcutaneous Tissue) Exposed: Yes Muscle: Yes] [6:N/A] [7:Fascia: No Fat Layer (Subcutaneous Tissue) Exposed: No] Fascia: No Tendon: No Tendon: No Muscle: No Joint: No Joint: No Bone: No Bone: No Limited to Skin Breakdown Epithelialization: None N/A None Debridement: Debridement ([id number]- N/A N/A 11047) Pre-procedure 15:40 N/A N/A Verification/Time Out Taken: Pain Control: Other N/A N/A Tissue Debrided:  Fibrin/Slough, Muscle N/A N/A Level: Skin/Subcutaneous N/A N/A Tissue/Muscle Debridement Area (sq 8.4 N/A N/A cm): Instrument: Curette N/A N/A Bleeding: Moderate N/A N/A Hemostasis Achieved: Pressure N/A N/A Procedural Pain: 2 N/A N/A Post Procedural Pain: 1 N/A N/A Debridement Treatment Procedure was tolerated N/A N/A Response: well Post Debridement 3x3.8x1.2 N/A N/A Measurements L x W x D (cm) Post Debridement 10.744 N/A N/A Volume: (cm) Post Debridement Category/Stage IV N/A N/A Stage: Periwound Skin Texture: Induration: Yes No Abnormalities Noted Induration: Yes Excoriation: No Callus: No Crepitus: No Rash: No Scarring: No Periwound Skin Dry/Scaly: Yes No Abnormalities Noted Maceration: No Moisture: Dry/Scaly: No Periwound Skin Color: Erythema: Yes No Abnormalities Noted Erythema: Yes Rubor: Yes Atrophie Blanche: No Cyanosis: No Ecchymosis: No Hemosiderin Staining: No Mottled: No Pallor: No Rubor: No Erythema Location: Circumferential N/A Circumferential Temperature: No Abnormality N/A N/A Watters, Charles K. ([id number]) Tenderness on Yes No No Palpation: Wound Preparation: Ulcer Cleansing: N/A Ulcer Cleansing: Rinsed/Irrigated with Rinsed/Irrigated with Saline Saline Topical Anesthetic Topical Anesthetic Applied: Other: lidocaine Applied: Other: lidociane 4% 4% Procedures Performed: Debridement N/A N/A Treatment Notes Wound #1 (Sacrum) 1. Cleansed with: Clean wound with Normal Saline 2. Anesthetic Topical Lidocaine 4% cream to wound bed prior to debridement 4. Dressing Applied: Santyl Ointment 5. Secondary Dressing Applied Bordered Foam Dressing Aquacel Ag Wound #7 (Left Sacrum) 1. Cleansed with: Clean wound with Normal Saline 2. Anesthetic Topical Lidocaine 4% cream to wound bed prior to debridement 4. Dressing Applied: Santyl Ointment 5. Secondary Dressing Applied Bordered Foam Dressing Aquacel Ag Electronic Signature(s) Signed:  07/26/2016 5:23:04 PM By: Linton Ham MD Entered By: Linton Ham on 07/26/2016 16:01:21 [place], [name] ([id number]) -------------------------------------------------------------------------------- Multi-Disciplinary Care Plan Details Patient Name: [name] Date of Service: 07/26/2016 3:30 PM Medical Record Number: [id number] Patient Account Number: [id number] Date of Birth/Sex: 1940/09/02 (76 y.o. Male) Treating RN: Cornell Barman Primary Care Jamaris Theard: Johny Drilling Other Clinician: Referring Geraldine Tesar: Johny Drilling Treating Timarion Agcaoili/Extender: Tito Dine in Treatment: 4 Active Inactive ` Abuse / Safety / Falls / Self Care Management Nursing Diagnoses: Potential for falls Goals: Patient will not experience any injury related to falls Date Initiated: 06/28/2016 Target Resolution Date: 10/08/2016 Goal Status: Active Interventions: Assess Activities of Daily Living upon admission and as needed Assess: immobility, friction, shearing, incontinence upon admission and as needed Assess impairment of mobility on admission and as needed per policy Notes: ` Nutrition Nursing Diagnoses: Imbalanced nutrition Impaired glucose control: actual or potential Potential for alteratiion in Nutrition/Potential for imbalanced nutrition Goals: Patient/caregiver agrees to and verbalizes understanding of need to use nutritional supplements and/or vitamins as prescribed Date Initiated: 06/28/2016 Target Resolution Date: 09/10/2016 Goal Status: Active Patient/caregiver verbalizes understanding of need to  maintain therapeutic glucose control per primary care physician Date Initiated: 06/28/2016 Target Resolution Date: 09/10/2016 Goal Status: Active Interventions: MCADAMSRahn, [place] ([id number]) Assess patient nutrition upon admission and as needed per policy Notes: ` Orientation to the Wound Care Program Nursing Diagnoses: Knowledge deficit related to the wound healing center  program Goals: Patient/caregiver will verbalize understanding of the [facility] Date Initiated: 06/28/2016 Target Resolution Date: 07/09/2016 Goal Status: Active Interventions: Provide education on orientation to the wound center Notes: ` Pain, Acute or Chronic Nursing Diagnoses: Pain, acute or chronic: actual or potential Potential alteration in comfort, pain Goals: Patient/caregiver will verbalize adequate pain control between visits Date Initiated: 06/28/2016 Target Resolution Date: 10/08/2016 Goal Status: Active Interventions: Assess comfort goal upon admission Complete pain assessment as per visit requirements Notes: ` Pressure Nursing Diagnoses: Knowledge deficit related to causes and risk factors for pressure ulcer development Knowledge deficit related to management of pressures ulcers Potential for impaired tissue integrity related to pressure, friction, moisture, and shear Goals: Patient will remain free from development of additional pressure ulcers Heemstra, [name] ([id number]) Date Initiated: 06/28/2016 Target Resolution Date: 10/08/2016 Goal Status: Active Interventions: Assess: immobility, friction, shearing, incontinence upon admission and as needed Assess offloading mechanisms upon admission and as needed Notes: ` Wound/Skin Impairment Nursing Diagnoses: Impaired tissue integrity Knowledge deficit related to ulceration/compromised skin integrity Goals: Ulcer/skin breakdown will have a volume reduction of 80% by week 12 Date Initiated: 06/28/2016 Target Resolution Date: 10/01/2016 Goal Status: Active Interventions: Assess patient/caregiver ability to perform ulcer/skin care regimen upon admission and as needed Assess ulceration(s) every visit Notes: Electronic Signature(s) Signed: 07/26/2016 4:25:40 PM By: Gretta Cool, BSN, RN, CWS, Kim RN, BSN Entered By: Gretta Cool, BSN, RN, CWS, Kim on 07/26/2016 15:49:56 [place], [name]  ([id number]) -------------------------------------------------------------------------------- Pain Assessment Details Patient Name: [name] Date of Service: 07/26/2016 3:30 PM Medical Record Number: [id number] Patient Account Number: [id number] Date of Birth/Sex: Oct 27, 1940 (75 y.o. Male) Treating RN: Cornell Barman Primary Care Annelyse Rey: Johny Drilling Other Clinician: Referring Jakaylee Sasaki: Johny Drilling Treating Denine Brotz/Extender: Ricard Dillon Weeks in Treatment: 4 Active Problems Location of Pain Severity and Description of Pain Patient Has Paino No Site Locations With Dressing Change: No Pain Management and Medication Current Pain Management: Electronic Signature(s) Signed: 07/26/2016 4:25:40 PM By: Gretta Cool, BSN, RN, CWS, Kim RN, BSN Entered By: Gretta Cool, BSN, RN, CWS, Kim on 07/26/2016 15:10:14 Capo[place], [name] ([id number]) -------------------------------------------------------------------------------- Patient/Caregiver Education Details Patient Name: [name] Date of Service: 07/26/2016 3:30 PM Medical Record Patient Account Number: [id number] Number: Treating RN: Cornell Barman 30-Jun-1940 (75 y.o. Other Clinician: Date of Birth/Gender: Male) Treating [name], [name] Primary Care Physician: Johny Drilling Physician/Extender: G Referring Physician: Nettie Elm in Treatment: 4 Education Assessment Education Provided To: Patient Education Topics Provided Offloading: Handouts: Other: keep pressure off of sacral area Methods: Demonstration, Explain/Verbal Responses: State content correctly Wound/Skin Impairment: Handouts: Caring for Your Ulcer, Other: daily dressing changes as prescribed Methods: Demonstration Responses: State content correctly Electronic Signature(s) Signed: 07/26/2016 4:25:40 PM By: Gretta Cool, BSN, RN, CWS, Kim RN, BSN Entered By: Gretta Cool, BSN, RN, CWS, Kim on 07/26/2016 16:00:05 [place], [name]  ([id number]) -------------------------------------------------------------------------------- Wound Assessment Details Patient Name: [name] Date of Service: 07/26/2016 3:30 PM Medical Record Number: [id number] Patient Account Number: [id number] Date of Birth/Sex: 1940-11-08 (76 y.o. Male) Treating RN: Cornell Barman Primary Care Deandre Stansel: Johny Drilling Other Clinician: Referring Zoua Caporaso: Johny Drilling Treating Nanci Lakatos/Extender: Ricard Dillon Weeks in Treatment: 4 Wound Status Wound Number: 1 Primary Etiology:  Pressure Ulcer Wound Location: Sacrum Wound Status: Open Wounding Event: Pressure Injury Date Acquired: 06/14/2016 Weeks Of Treatment: 4 Clustered Wound: No Photos Photo Uploaded By: Gretta Cool, BSN, RN, CWS, Kim on 07/26/2016 16:08:22 Wound Measurements Length: (cm) 3 Width: (cm) 2.8 Depth: (cm) 1 Area: (cm) 6.597 Volume: (cm) 6.597 % Reduction in Area: 82.6% % Reduction in Volume: 13.2% Epithelialization: None Tunneling: Yes Position (o'clock): 3 Maximum Distance: (cm) 1.8 Wound Description Classification: Category/Stage IV Wound Margin: Distinct, outline attached Exudate Amount: Large Exudate Type: Serosanguineous Exudate Color: red, brown Foul Odor After Cleansing: No Slough/Fibrino Yes Wound Bed Granulation Amount: None Present (0%) Exposed Structure Necrotic Amount: Large (67-100%) Fascia Exposed: No Necrotic Quality: Eschar Fat Layer (Subcutaneous Tissue) Exposed: Yes Tendon Exposed: No Muscle Exposed: Yes Walkowski, Charles K. ([id number]) Necrosis of Muscle: Yes Joint Exposed: No Bone Exposed: No Periwound Skin Texture Texture Color No Abnormalities Noted: No No Abnormalities Noted: No Induration: Yes Erythema: Yes Erythema Location: Circumferential Moisture Rubor: Yes No Abnormalities Noted: No Dry / Scaly: Yes Temperature / Pain Temperature: No Abnormality Tenderness on Palpation: Yes Wound Preparation Ulcer Cleansing:  Rinsed/Irrigated with Saline Topical Anesthetic Applied: Other: lidocaine 4%, Electronic Signature(s) Signed: 07/26/2016 4:25:40 PM By: Gretta Cool, BSN, RN, CWS, Kim RN, BSN Entered By: Gretta Cool, BSN, RN, CWS, Kim on 07/26/2016 15:39:26 [place], [name] ([id number]) -------------------------------------------------------------------------------- Wound Assessment Details Patient Name: [name] Date of Service: 07/26/2016 3:30 PM Medical Record Number: [id number] Patient Account Number: [id number] Date of Birth/Sex: 24-Aug-1940 (75 y.o. Male) Treating RN: Cornell Barman Primary Care Colton Tassin: Johny Drilling Other Clinician: Referring Celestial Barnfield: Johny Drilling Treating Abdelaziz Westenberger/Extender: Tito Dine in Treatment: 4 Wound Status Wound Number: 6 Primary Etiology: Trauma, Other Wound Location: Right, Lateral Malleolus Wound Status: Healed - Epithelialized Wounding Event: Trauma Date Acquired: 06/14/2016 Weeks Of Treatment: 4 Clustered Wound: No Photos Photo Uploaded By: Gretta Cool, BSN, RN, CWS, Kim on 07/26/2016 16:08:22 Wound Measurements Length: (cm) 0 % Reduction i Width: (cm) 0 % Reduction i Depth: (cm) 0 Area: (cm) 0 Volume: (cm) 0 n Area: 100% n Volume: 100% Wound Description Classification: Partial Thickness Periwound Skin Texture Texture Color No Abnormalities Noted: No No Abnormalities Noted: No Moisture No Abnormalities Noted: No Electronic Signature(s) Signed: 07/26/2016 4:25:40 PM By: Gretta Cool, BSN, RN, CWS, Kim RN, BSN Entered By: Gretta Cool, BSN, RN, CWS, Kim on 07/26/2016 15:38:58 [place], [name] ([id number]) -------------------------------------------------------------------------------- Wound Assessment Details Patient Name: [name] Date of Service: 07/26/2016 3:30 PM Medical Record Number: [id number] Patient Account Number: [id number] Date of Birth/Sex: Sep 10, 1940 (75 y.o. Male) Treating RN: Cornell Barman Primary Care Bridie Colquhoun: Johny Drilling Other  Clinician: Referring Letisia Schwalb: Johny Drilling Treating Maisen Schmit/Extender: Ricard Dillon Weeks in Treatment: 4 Wound Status Wound Number: 7 Primary Etiology: Pressure Ulcer Wound Location: Left Sacrum Wound Status: Open Wounding Event: Pressure Injury Date Acquired: 07/03/2016 Weeks Of Treatment: 0 Clustered Wound: No Photos Wound Measurements Length: (cm) 1 Width: (cm) 1 Depth: (cm) 0.1 Area: (cm) 0.785 Volume: (cm) 0.079 % Reduction in Area: 0% % Reduction in Volume: 0% Epithelialization: None Tunneling: No Undermining: No Wound Description Classification: Category/Stage II Wound Margin: Flat and Intact Exudate Amount: Medium Exudate Type: Serosanguineous Exudate Color: red, brown Foul Odor After Cleansing: No Slough/Fibrino No Wound Bed Granulation Amount: Small (1-33%) Exposed Structure Granulation Quality: Red Fascia Exposed: No Necrotic Amount: Small (1-33%) Fat Layer (Subcutaneous Tissue) Exposed: No Necrotic Quality: Adherent Slough Tendon Exposed: No Muscle Exposed: No Joint Exposed: No Bone Exposed: No Savarino, Charles K. ([id number]) Limited to Skin Breakdown Periwound  Skin Texture Texture Color No Abnormalities Noted: No No Abnormalities Noted: No Callus: No Atrophie Blanche: No Crepitus: No Cyanosis: No Excoriation: Yes Ecchymosis: No Induration: No Erythema: Yes Rash: No Erythema Location: Circumferential Scarring: No Hemosiderin Staining: No Mottled: No Moisture Pallor: No No Abnormalities Noted: No Rubor: No Dry / Scaly: No Maceration: No Wound Preparation Ulcer Cleansing: Rinsed/Irrigated with Saline Topical Anesthetic Applied: Other: lidociane 4%, Treatment Notes Wound #7 (Left Sacrum) 1. Cleansed with: Clean wound with Normal Saline 2. Anesthetic Topical Lidocaine 4% cream to wound bed prior to debridement 4. Dressing Applied: Santyl Ointment 5. Secondary Dressing Applied Bordered Foam Dressing Aquacel  Ag Electronic Signature(s) Signed: 07/26/2016 4:25:40 PM By: Gretta Cool, BSN, RN, CWS, Kim RN, BSN Entered By: Gretta Cool, BSN, RN, CWS, Kim on 07/26/2016 16:05:01 [name], [name] ([id number]) -------------------------------------------------------------------------------- [place] Details Patient Name: [name] Date of Service: 07/26/2016 3:30 PM Medical Record Number: [id number] Patient Account Number: [id number] Date of Birth/Sex: 1940-07-12 (75 y.o. Male) Treating RN: Cornell Barman Primary Care Justinn Welter: Johny Drilling Other Clinician: Referring Gaven Eugene: Johny Drilling Treating Kathe Wirick/Extender: Tito Dine in Treatment: 4 Vital Signs Time Taken: 15:10 Temperature (F): 98.1 Height (in): 69 Pulse (bpm): 78 Weight (lbs): 147.5 Respiratory Rate (breaths/min): 16 Body Mass Index (BMI): 21.8 Blood Pressure (mmHg): 120/68 Reference Range: 80 - 120 mg / dl Electronic Signature(s) Signed: 07/26/2016 4:25:40 PM By: Gretta Cool, BSN, RN, CWS, Kim RN, BSN Entered By: Gretta Cool, BSN, RN, CWS, Kim on 07/26/2016 15:10:43

## 2016-07-27 NOTE — Progress Notes (Signed)
[name], [name] ([id number]) Visit Report for 07/26/2016 Debridement Details Patient Name: [name], [name] Date of Service: 07/26/2016 3:30 PM Medical Record Patient Account Number: [id number] Number: Treating RN: Cornell Barman Feb 22, 1940 (75 y.o. Other Clinician: Date of Birth/Sex: Male) Treating Neilah Fulwider Primary Care Provider: Johny Drilling Provider/Extender: G Referring Provider: Nettie Elm in Treatment: 4 Debridement Performed for Wound #1 Sacrum Assessment: Performed By: Physician Ricard Dillon, MD Debridement: Debridement Pre-procedure Verification/Time Out Yes - 15:40 Taken: Start Time: 15:41 Pain Control: Other : lidocaine 4% Level: Skin/Subcutaneous Tissue/Muscle Total Area Debrided (L x 3 (cm) x 2.8 (cm) = 8.4 (cm) W): Tissue and other Viable, Non-Viable, Fibrin/Slough, Muscle material debrided: Instrument: Curette Bleeding: Moderate Hemostasis Achieved: Pressure End Time: 15:44 Procedural Pain: 2 Post Procedural Pain: 1 Response to Treatment: Procedure was tolerated well Post Debridement Measurements of Total Wound Length: (cm) 3 Stage: Category/Stage IV Width: (cm) 3.8 Depth: (cm) 1.2 Volume: (cm) 10.744 Character of Wound/Ulcer Post Requires Further Debridement: Debridement Post Procedure Diagnosis Same as Pre-procedure Electronic Signature(s) VERNOR, MONNIG ([id number]) Signed: 07/26/2016 4:25:40 PM By: Gretta Cool, BSN, RN, CWS, Kim RN, BSN Signed: 07/26/2016 5:23:04 PM By: Linton Ham MD Entered By: Linton Ham on 07/26/2016 16:01:53 [name], [name] ([id number]) -------------------------------------------------------------------------------- HPI Details Patient Name: [name] Date of Service: 07/26/2016 3:30 PM Medical Record Patient Account Number: [id number] Number: Treating RN: Cornell Barman 1940/12/17 (75 y.o. Other Clinician: Date of Birth/Sex: Male) Treating Bethann Qualley,  Kalem Rockwell Primary Care Provider: Johny Drilling Provider/Extender: G Referring Provider: Nettie Elm in Treatment: 4 History of Present Illness HPI Description: 06/28/16; this is a 76 year old man who was admitted to hospital from 6/13 through 06/18/16 at [facility] after being found down in a closet in his home. It is not exactly clear how long he was actually there although it was probably for days. His total CK maximum was at 2582. He was admitted with delirium, possibly heat related illness, possible subendocardial MI. An MRI of the brain was negative. During this hospitalization he was noted to have areas of pressure-related injury for the time he was spent on the floor on his back he was stated to have a stage II pressure ulcer over his buttocks. Multiple excoriations were also noted. He has advanced home care about doing physical therapy there requesting a nurse which seems reasonable. The patient is a diabetic on metformin. They're using bacitracin to all these areas. He was prescribed doxycycline which I believe he is still on. 07/05/16; patient with pressure ulcers on his right greater than left buttock surrounding his coccyx. Excoriation over the right lateral ankle an excoriation on his upper back. They did not get any Santyl or medihoney. We will take care of this today. 07/12/16 unfortunately patient's wounds appeared to be doing better across the board and the eschar that is overlying the sacral wound has loosened up to where I could debride somewhat today. There's no evidence of 07/20/16; butterfly shaped wound across the sacrum and the surrounding skin and soft tissue. The area on the right is much larger than the left. The area on the left looks healthy on the right still and necrotic nonviable surface. Using Santyl 07/26/16; butterfly shaped wound across the sacrum and surrounding skin and soft tissue. The area on the right much larger than the left. Extensive  debridement on the right last week post-debridement culture showed enterococcus faecalis. This is ampicillin sensitive and he'll definitely need Augmentin. We have been using Santyl.Kermit Balo improvement this  week Electronic Signature(s) Signed: 07/26/2016 5:23:04 PM By: Linton Ham MD Entered By: Linton Ham on 07/26/2016 16:04:07 [name], [name] ([id number]) -------------------------------------------------------------------------------- Physical Exam Details Patient Name: [name] Date of Service: 07/26/2016 3:30 PM Medical Record Patient Account Number: [id number] Number: Treating RN: Cornell Barman March 27, 1940 (75 y.o. Other Clinician: Date of Birth/Sex: Male) Treating Kimley Apsey Primary Care Provider: Johny Drilling Provider/Extender: G Referring Provider: Nettie Elm in Treatment: 4 Notes Wound exam; the area on the left side is small and healthy. The area on the right much better this week. Still an extensive debridement with a #5 curet removing surface slough and still nonviable muscle. There is tunneling from 12 to 2:00 perhaps 1 cm. Overall that wound bed is firmly defined now as being down to muscle. I had feared that this would go down to bone Electronic Signature(s) Signed: 07/26/2016 5:23:04 PM By: Linton Ham MD Entered By: Linton Ham on 07/26/2016 16:06:20 [name], [name] ([id number]) -------------------------------------------------------------------------------- Physician Orders Details Patient Name: [name] Date of Service: 07/26/2016 3:30 PM Medical Record Patient Account Number: [id number] Number: Treating RN: Cornell Barman [birth year] (75 y.o. Other Clinician: Date of Birth/Sex: Male) Treating Kelce Bouton Primary Care Provider: Johny Drilling Provider/Extender: G Referring Provider: Nettie Elm in Treatment: 4 Verbal / Phone Orders: No Diagnosis Coding Wound Cleansing Wound #1  Sacrum o Cleanse wound with mild soap and water Wound #7 Left Sacrum o Cleanse wound with mild soap and water Anesthetic Wound #1 Sacrum o Topical Lidocaine 4% cream applied to wound bed prior to debridement Wound #7 Left Sacrum o Topical Lidocaine 4% cream applied to wound bed prior to debridement Primary Wound Dressing Wound #1 Sacrum o Santyl Ointment Wound #7 Left Sacrum o Santyl Ointment Secondary Dressing Wound #1 Sacrum o Aquacel Ag o Boardered Foam Dressing Wound #7 Left Sacrum o Aquacel Ag o Boardered Foam Dressing Dressing Change Frequency Wound #1 Sacrum o Change dressing every day. Ruis, Maanav K. ([id number]) Wound #7 Left Sacrum o Change dressing every day. Follow-up Appointments Wound #1 Sacrum o Return Appointment in 1 week. Wound #7 Left Sacrum o Return Appointment in 1 week. Off-Loading Wound #1 Sacrum o Turn and reposition every 2 hours Wound #7 Left Sacrum o Turn and reposition every 2 hours Patient Medications Allergies: NKDA Notifications Medication Indication Start End Augmentin wound infection 07/26/2016 DOSE oral 875 mg-125 mg tablet - 1 tablet oral bid Electronic Signature(s) Signed: 07/26/2016 4:25:40 PM By: Gretta Cool, BSN, RN, CWS, Kim RN, BSN Signed: 07/26/2016 5:23:04 PM By: Linton Ham MD Previous Signature: 07/26/2016 3:48:37 PM Version By: Linton Ham MD Entered By: Gretta Cool, BSN, RN, CWS, Kim on 07/26/2016 15:57:42 [name], [name] ([id number]) -------------------------------------------------------------------------------- Problem List Details Patient Name: [name] Date of Service: 07/26/2016 3:30 PM Medical Record Patient Account Number: [id number] Number: Treating RN: Cornell Barman 05/15/1940 (75 y.o. Other Clinician: Date of Birth/Sex: Male) Treating Abrahan Fulmore Primary Care Provider: Johny Drilling Provider/Extender: G Referring Provider: Nettie Elm in  Treatment: 4 Active Problems ICD-10 Encounter Code Description Active Date Diagnosis L89.150 Pressure ulcer of sacral region, unstageable 06/28/2016 Yes S91.011D Laceration without foreign body, right ankle, subsequent 06/28/2016 Yes encounter S21.219D Laceration without foreign body of unspecified back wall 06/28/2016 Yes of thorax without penetration into thoracic cavity, subsequent encounter E11.622 Type 2 diabetes mellitus with other skin ulcer 06/28/2016 Yes Inactive Problems Resolved Problems Electronic Signature(s) Signed: 07/26/2016 5:23:04 PM By: Linton Ham MD Entered By: Linton Ham on 07/26/2016 16:01:06 [name], [name]  Raliegh Ip ([id number]) -------------------------------------------------------------------------------- Progress Note Details Patient Name: JACY, HOWAT Date of Service: 07/26/2016 3:30 PM Medical Record Patient Account Number: [id number] Number: Treating RN: Cornell Barman 1940/03/17 (75 y.o. Other Clinician: Date of Birth/Sex: Male) Treating Jeremian Whitby Primary Care Provider: Johny Drilling Provider/Extender: G Referring Provider: Nettie Elm in Treatment: 4 Subjective History of Present Illness (HPI) 06/28/16; this is a 76 year old man who was admitted to hospital from 6/13 through 06/18/16 at [facility] after being found down in a closet in his home. It is not exactly clear how long he was actually there although it was probably for days. His total CK maximum was at 2582. He was admitted with delirium, possibly heat related illness, possible subendocardial MI. An MRI of the brain was negative. During this hospitalization he was noted to have areas of pressure-related injury for the time he was spent on the floor on his back he was stated to have a stage II pressure ulcer over his buttocks. Multiple excoriations were also noted. He has advanced home care about doing physical therapy there requesting a nurse which seems  reasonable. The patient is a diabetic on metformin. They're using bacitracin to all these areas. He was prescribed doxycycline which I believe he is still on. 07/05/16; patient with pressure ulcers on his right greater than left buttock surrounding his coccyx. Excoriation over the right lateral ankle an excoriation on his upper back. They did not get any Santyl or medihoney. We will take care of this today. 07/12/16 unfortunately patient's wounds appeared to be doing better across the board and the eschar that is overlying the sacral wound has loosened up to where I could debride somewhat today. There's no evidence of 07/20/16; butterfly shaped wound across the sacrum and the surrounding skin and soft tissue. The area on the right is much larger than the left. The area on the left looks healthy on the right still and necrotic nonviable surface. Using Santyl 07/26/16; butterfly shaped wound across the sacrum and surrounding skin and soft tissue. The area on the right much larger than the left. Extensive debridement on the right last week post-debridement culture showed enterococcus faecalis. This is ampicillin sensitive and he'll definitely need Augmentin. We have been using Santyl.Kermit Balo improvement this week Objective Constitutional [name], Charles K. ([id number]) Vitals Time Taken: 3:10 PM, Height: 69 in, Weight: 147.5 lbs, BMI: 21.8, Temperature: 98.1 F, Pulse: 78 bpm, Respiratory Rate: 16 breaths/min, Blood Pressure: 120/68 mmHg. Integumentary (Hair, Skin) Wound #1 status is Open. Original cause of wound was Pressure Injury. The wound is located on the Sacrum. The wound measures 3cm length x 2.8cm width x 1cm depth; 6.597cm^2 area and 6.597cm^3 volume. There is muscle and Fat Layer (Subcutaneous Tissue) Exposed exposed. There is tunneling at 3:00 with a maximum distance of 1.8cm. There is a large amount of serosanguineous drainage noted. The wound margin is distinct with the outline attached  to the wound base. There is no granulation within the wound bed. There is a large (67-100%) amount of necrotic tissue within the wound bed including Eschar and Necrosis of Muscle. The periwound skin appearance exhibited: Induration, Dry/Scaly, Rubor, Erythema. The surrounding wound skin color is noted with erythema which is circumferential. Periwound temperature was noted as No Abnormality. The periwound has tenderness on palpation. Wound #6 status is Healed - Epithelialized. Original cause of wound was Trauma. The wound is located on the Right,Lateral Malleolus. The wound measures 0cm length x 0cm width x 0cm depth; 0cm^2 area and  0cm^3 volume. Wound #7 status is Open. Original cause of wound was Pressure Injury. The wound is located on the Left Sacrum. The wound measures 1cm length x 1cm width x 0.1cm depth; 0.785cm^2 area and 0.079cm^3 volume. The wound is limited to skin breakdown. There is no tunneling or undermining noted. There is a medium amount of serosanguineous drainage noted. The wound margin is flat and intact. There is small (1-33%) red granulation within the wound bed. There is a small (1-33%) amount of necrotic tissue within the wound bed including Adherent Slough. The periwound skin appearance exhibited: Excoriation, Erythema. The periwound skin appearance did not exhibit: Callus, Crepitus, Induration, Rash, Scarring, Dry/Scaly, Maceration, Atrophie Blanche, Cyanosis, Ecchymosis, Hemosiderin Staining, Mottled, Pallor, Rubor. The surrounding wound skin color is noted with erythema which is circumferential. Assessment Active Problems ICD-10 L89.150 - Pressure ulcer of sacral region, unstageable S91.011D - Laceration without foreign body, right ankle, subsequent encounter S21.219D - Laceration without foreign body of unspecified back wall of thorax without penetration into thoracic cavity, subsequent encounter E11.622 - Type 2 diabetes mellitus with other skin  ulcer Procedures [name], Charles K. ([id number]) Wound #1 Pre-procedure diagnosis of Wound #1 is a Pressure Ulcer located on the Sacrum . There was a Skin/Subcutaneous Tissue/Muscle Debridement ([id number]) debridement with total area of 8.4 sq cm performed by Ricard Dillon, MD. with the following instrument(s): Curette to remove Viable and Non-Viable tissue/material including Fibrin/Slough and Muscle after achieving pain control using Other (lidocaine 4%). A time out was conducted at 15:40, prior to the start of the procedure. A Moderate amount of bleeding was controlled with Pressure. The procedure was tolerated well with a pain level of 2 throughout and a pain level of 1 following the procedure. Post Debridement Measurements: 3cm length x 3.8cm width x 1.2cm depth; 10.744cm^3 volume. Post debridement Stage noted as Category/Stage IV. Character of Wound/Ulcer Post Debridement requires further debridement. Post procedure Diagnosis Wound #1: Same as Pre-Procedure Plan Wound Cleansing: Wound #1 Sacrum: Cleanse wound with mild soap and water Wound #7 Left Sacrum: Cleanse wound with mild soap and water Anesthetic: Wound #1 Sacrum: Topical Lidocaine 4% cream applied to wound bed prior to debridement Wound #7 Left Sacrum: Topical Lidocaine 4% cream applied to wound bed prior to debridement Primary Wound Dressing: Wound #1 Sacrum: Santyl Ointment Wound #7 Left Sacrum: Santyl Ointment Secondary Dressing: Wound #1 Sacrum: Aquacel Ag Boardered Foam Dressing Wound #7 Left Sacrum: Aquacel Ag Boardered Foam Dressing Dressing Change Frequency: Wound #1 Sacrum: Change dressing every day. Wound #7 Left Sacrum: Change dressing every day. Follow-up Appointments: Wound #1 Sacrum: Return Appointment in 1 week. [name], [name] ([id number]) Wound #7 Left Sacrum: Return Appointment in 1 week. Off-Loading: Wound #1 Sacrum: Turn and reposition every 2 hours Wound #7 Left Sacrum: Turn  and reposition every 2 hours The following medication(s) was prescribed: Augmentin oral 875 mg-125 mg tablet 1 tablet oral bid for wound infection starting 07/26/2016 #1 Augmentin 875 twice a day for 7 days- escribed #2 continue Santyl to both wound areas for another week #3 excoriations on the right lateral leg appear healed #4 consider changing to college in next week. I think the area on the right will fill in without a wound VAC but that also remains to be seen Electronic Signature(s) Signed: 07/26/2016 5:23:04 PM By: Linton Ham MD Entered By: Linton Ham on 07/26/2016 16:08:29 [place], [name] ([id number]) -------------------------------------------------------------------------------- SuperBill Details Patient Name: [name] Date of Service: 07/26/2016 Medical Record Patient Account Number: [id number]  Number: Treating RN: Cornell Barman 1940/06/18 (75 y.o. Other Clinician: Date of Birth/Sex: Male) Treating Zamiah Tollett Primary Care Provider: Johny Drilling Provider/Extender: G Referring Provider: Nettie Elm in Treatment: 4 Diagnosis Coding ICD-10 Codes Code Description L89.150 Pressure ulcer of sacral region, unstageable S91.011D Laceration without foreign body, right ankle, subsequent encounter Laceration without foreign body of unspecified back wall of thorax without penetration into S21.219D thoracic cavity, subsequent encounter E11.622 Type 2 diabetes mellitus with other skin ulcer Facility Procedures CPT4 Code: [id number] Description: 47207 - DEB MUSC/FASCIA 20 SQ CM/< ICD-10 Description Diagnosis L89.150 Pressure ulcer of sacral region, unstageable Modifier: Quantity: 1 Physician Procedures CPT4 Code: [id number] Description: 37445 - WC PHYS DEBR MUSCLE/FASCIA 20 SQ CM ICD-10 Description Diagnosis L89.150 Pressure ulcer of sacral region, unstageable Modifier: Quantity: 1 Electronic Signature(s) Signed: 07/26/2016 5:23:04 PM By:  Linton Ham MD Entered By: Linton Ham on 07/26/2016 16:09:08

## 2016-08-02 ENCOUNTER — Encounter: Payer: Medicare Other | Admitting: Internal Medicine

## 2016-08-02 DIAGNOSIS — E11622 Type 2 diabetes mellitus with other skin ulcer: Secondary | ICD-10-CM | POA: Diagnosis not present

## 2016-08-03 NOTE — Progress Notes (Signed)
OWYNN, MOSQUEDA ([id number]) Visit Report for 08/02/2016 Arrival Information Details Patient Name: [name], [name] Date of Service: 08/02/2016 12:30 PM Medical Record Number: [id number] Patient Account Number: [id number] Date of Birth/Sex: 1940/05/08 (75 y.o. Male) Treating RN: Ahmed Prima Primary Care Patsy Zaragoza: Johny Drilling Other Clinician: Referring Evangaline Jou: Johny Drilling Treating Marquiz Sotelo/Extender: Tito Dine in Treatment: 5 Visit Information History Since Last Visit All ordered tests and consults were completed: No Patient Arrived: [name] Added or deleted any medications: No Arrival Time: 12:42 Any new allergies or adverse reactions: No Accompanied By: daughter Had a fall or experienced change in No Transfer Assistance: None activities of daily living that may affect Patient Identification Verified: Yes risk of falls: Secondary Verification Process Yes Signs or symptoms of abuse/neglect since last No Completed: visito Patient Requires Transmission-Based No Hospitalized since last visit: No Precautions: Has Dressing in Place as Prescribed: Yes Patient Has Alerts: Yes Pain Present Now: No Patient Alerts: DM II Electronic Signature(s) Signed: 08/02/2016 5:31:33 PM By: Alric Quan Entered By: Alric Quan on 08/02/2016 12:43:13 Adriano, [name] ([id number]) -------------------------------------------------------------------------------- Encounter Discharge Information Details Patient Name: [name] Date of Service: 08/02/2016 12:30 PM Medical Record Number: [id number] Patient Account Number: [id number] Date of Birth/Sex: Jul 14, 1940 (75 y.o. Male) Treating RN: Ahmed Prima Primary Care Jayleena Stille: Johny Drilling Other Clinician: Referring Roben Tatsch: Johny Drilling Treating Jester Klingberg/Extender: Tito Dine in Treatment: 5 Encounter Discharge Information Items Discharge Pain Level: 0 Discharge Condition: Stable Ambulatory  Status: Cane Discharge Destination: Home Transportation: Private Auto Accompanied By: daughter Schedule Follow-up Appointment: Yes Medication Reconciliation completed No and provided to Patient/Care Dejanay Wamboldt: Provided on Clinical Summary of Care: 08/02/2016 Form Type Recipient Paper Patient LM Electronic Signature(s) Signed: 08/02/2016 1:35:03 PM By: Ruthine Dose Entered By: Ruthine Dose on 08/02/2016 13:35:03 [place], [name] ([id number]) -------------------------------------------------------------------------------- Lower Extremity Assessment Details Patient Name: [name] Date of Service: 08/02/2016 12:30 PM Medical Record Number: [id number] Patient Account Number: [id number] Date of Birth/Sex: Sep 24, 1940 (76 y.o. Male) Treating RN: Ahmed Prima Primary Care Armida Vickroy: Johny Drilling Other Clinician: Referring Abdoulie Tierce: Johny Drilling Treating Tierre Netto/Extender: Ricard Dillon Weeks in Treatment: 5 Electronic Signature(s) Signed: 08/02/2016 5:31:33 PM By: Alric Quan Entered By: Alric Quan on 08/02/2016 12:54:42 [place], [name] ([id number]) -------------------------------------------------------------------------------- Multi Wound Chart Details Patient Name: [name] Date of Service: 08/02/2016 12:30 PM Medical Record Number: [id number] Patient Account Number: [id number] Date of Birth/Sex: [birth year] (76 y.o. Male) Treating RN: Ahmed Prima Primary Care Rashaad Hallstrom: Johny Drilling Other Clinician: Referring Dealva Lafoy: Johny Drilling Treating Isabele Lollar/Extender: Ricard Dillon Weeks in Treatment: 5 Vital Signs Height(in): 69 Pulse(bpm): 58 Weight(lbs): 147.5 Blood Pressure 119/60 (mmHg): Body Mass Index(BMI): 22 Temperature(F): 98.1 Respiratory Rate 16 (breaths/min): Photos: [1:No Photos] [7:No Photos] [N/A:N/A] Wound Location: [1:Right Sacrum] [7:Left Sacrum] [N/A:N/A] Wounding Event: [1:Pressure Injury] [7:Pressure Injury]  [N/A:N/A] Primary Etiology: [1:Pressure Ulcer] [7:Pressure Ulcer] [N/A:N/A] Date Acquired: [1:06/14/2016] [7:07/03/2016] [N/A:N/A] Weeks of Treatment: [1:5] [7:1] [N/A:N/A] Wound Status: [1:Open] [7:Open] [N/A:N/A] Measurements L x W x D 3x2.7x1 [7:0.2x0.2x0.1] [N/A:N/A] (cm) Area (cm) : [1:6.362] [7:0.031] [N/A:N/A] Volume (cm) : [1:6.362] [7:0.003] [N/A:N/A] % Reduction in Area: [1:83.20%] [7:96.10%] [N/A:N/A] % Reduction in Volume: 16.20% [7:96.20%] [N/A:N/A] Classification: [1:Category/Stage IV] [7:Category/Stage II] [N/A:N/A] Exudate Amount: [1:Large] [7:Medium] [N/A:N/A] Exudate Type: [1:Serosanguineous] [7:Serous] [N/A:N/A] Exudate Color: [1:red, brown] [7:amber] [N/A:N/A] Wound Margin: [1:Distinct, outline attached] [7:Flat and Intact] [N/A:N/A] Granulation Amount: [1:None Present (0%)] [7:None Present (0%)] [N/A:N/A] Necrotic Amount: [1:Large (67-100%)] [7:Large (67-100%)] [N/A:N/A] Necrotic Tissue: [1:Eschar, Adherent Slough] [7:Adherent Slough] [N/A:N/A] Exposed Structures: [1:Fat  Layer (Subcutaneous Tissue) Exposed: Yes Muscle: Yes Fascia: No Tendon: No Joint: No Bone: No] [7:Fascia: No Fat Layer (Subcutaneous Tissue) Exposed: No Tendon: No Muscle: No Joint: No Bone: No Limited to Skin Breakdown] [N/A:N/A] Epithelialization: None Large (67-100%) N/A Periwound Skin Texture: Induration: Yes Excoriation: Yes N/A Induration: No Callus: No Crepitus: No Rash: No Scarring: No Periwound Skin Dry/Scaly: Yes Maceration: No N/A Moisture: Dry/Scaly: No Periwound Skin Color: Erythema: Yes Erythema: Yes N/A Rubor: Yes Atrophie Blanche: No Cyanosis: No Ecchymosis: No Hemosiderin Staining: No Mottled: No Pallor: No Rubor: No Erythema Location: Circumferential Circumferential N/A Temperature: No Abnormality N/A N/A Tenderness on Yes No N/A Palpation: Wound Preparation: Ulcer Cleansing: Ulcer Cleansing: N/A Rinsed/Irrigated with Rinsed/Irrigated with Saline  Saline Topical Anesthetic Topical Anesthetic Applied: Other: lidocaine Applied: Other: lidociane 4% 4% Treatment Notes Electronic Signature(s) Signed: 08/02/2016 6:05:23 PM By: Linton Ham MD Entered By: Linton Ham on 08/02/2016 13:16:53 [place], [name] ([id number]) -------------------------------------------------------------------------------- [place] Details Patient Name: [name] Date of Service: 08/02/2016 12:30 PM Medical Record Number: [id number] Patient Account Number: [id number] Date of Birth/Sex: 16-Oct-1940 (76 y.o. Male) Treating RN: Ahmed Prima Primary Care Mersadez Linden: Johny Drilling Other Clinician: Referring Fanchon Papania: Johny Drilling Treating Xaden Kaufman/Extender: Tito Dine in Treatment: 5 Active Inactive ` Abuse / Safety / Falls / Self Care Management Nursing Diagnoses: Potential for falls Goals: Patient will not experience any injury related to falls Date Initiated: 06/28/2016 Target Resolution Date: 10/08/2016 Goal Status: Active Interventions: Assess Activities of Daily Living upon admission and as needed Assess: immobility, friction, shearing, incontinence upon admission and as needed Assess impairment of mobility on admission and as needed per policy Notes: ` Nutrition Nursing Diagnoses: Imbalanced nutrition Impaired glucose control: actual or potential Potential for alteratiion in Nutrition/Potential for imbalanced nutrition Goals: Patient/caregiver agrees to and verbalizes understanding of need to use nutritional supplements and/or vitamins as prescribed Date Initiated: 06/28/2016 Target Resolution Date: 09/10/2016 Goal Status: Active Patient/caregiver verbalizes understanding of need to maintain therapeutic glucose control per primary care physician Date Initiated: 06/28/2016 Target Resolution Date: 09/10/2016 Goal Status: Active Interventions: MCADAMSAikam, Vinje ([id number]) Assess patient nutrition  upon admission and as needed per policy Notes: ` Orientation to the Wound Care Program Nursing Diagnoses: Knowledge deficit related to the wound healing center program Goals: Patient/caregiver will verbalize understanding of the [place] Date Initiated: 06/28/2016 Target Resolution Date: 07/09/2016 Goal Status: Active Interventions: Provide education on orientation to the wound center Notes: ` Pain, Acute or Chronic Nursing Diagnoses: Pain, acute or chronic: actual or potential Potential alteration in comfort, pain Goals: Patient/caregiver will verbalize adequate pain control between visits Date Initiated: 06/28/2016 Target Resolution Date: 10/08/2016 Goal Status: Active Interventions: Assess comfort goal upon admission Complete pain assessment as per visit requirements Notes: ` Pressure Nursing Diagnoses: Knowledge deficit related to causes and risk factors for pressure ulcer development Knowledge deficit related to management of pressures ulcers Potential for impaired tissue integrity related to pressure, friction, moisture, and shear Goals: Patient will remain free from development of additional pressure ulcers Mulka, CASIMER RUSSETT ([id number]) Date Initiated: 06/28/2016 Target Resolution Date: 10/08/2016 Goal Status: Active Interventions: Assess: immobility, friction, shearing, incontinence upon admission and as needed Assess offloading mechanisms upon admission and as needed Notes: ` Wound/Skin Impairment Nursing Diagnoses: Impaired tissue integrity Knowledge deficit related to ulceration/compromised skin integrity Goals: Ulcer/skin breakdown will have a volume reduction of 80% by week 12 Date Initiated: 06/28/2016 Target Resolution Date: 10/01/2016 Goal Status: Active Interventions: Assess patient/caregiver ability to perform ulcer/skin  care regimen upon admission and as needed Assess ulceration(s) every visit Notes: Electronic  Signature(s) Signed: 08/02/2016 5:31:33 PM By: Alric Quan Entered By: Alric Quan on 08/02/2016 12:54:47 [place], [name] ([id number]) -------------------------------------------------------------------------------- Pain Assessment Details Patient Name: [name] Date of Service: 08/02/2016 12:30 PM Medical Record Number: [id number] Patient Account Number: [id number] Date of Birth/Sex: Aug 12, 1940 (76 y.o. Male) Treating RN: Ahmed Prima Primary Care Amarrah Meinhart: Johny Drilling Other Clinician: Referring Christelle Igoe: Johny Drilling Treating Adara Kittle/Extender: Tito Dine in Treatment: 5 Active Problems Location of Pain Severity and Description of Pain Patient Has Paino No Site Locations With Dressing Change: No Pain Management and Medication Current Pain Management: Electronic Signature(s) Signed: 08/02/2016 5:31:33 PM By: Alric Quan Entered By: Alric Quan on 08/02/2016 12:43:21 Pawling, [name] ([id number]) -------------------------------------------------------------------------------- Patient/Caregiver Education Details Patient Name: [name] Date of Service: 08/02/2016 12:30 PM Medical Record Patient Account Number: [id number] Number: Treating RN: Ahmed Prima [birth year] (75 y.o. Other Clinician: Date of Birth/Gender: Male) Treating ROBSON, MICHAEL Primary Care Physician: Johny Drilling Physician/Extender: G Referring Physician: Nettie Elm in Treatment: 5 Education Assessment Education Provided To: Patient Education Topics Provided Wound/Skin Impairment: Handouts: Other: change dressing as ordered Methods: Demonstration, Explain/Verbal Responses: State content correctly Electronic Signature(s) Signed: 08/02/2016 5:31:33 PM By: Alric Quan Entered By: Alric Quan on 08/02/2016 13:53:59 [place], [name]  ([id number]) -------------------------------------------------------------------------------- Wound Assessment Details Patient Name: [name] Date of Service: 08/02/2016 12:30 PM Medical Record Number: [id number] Patient Account Number: [id number] Date of Birth/Sex: 1940/06/06 (76 y.o. Male) Treating RN: Carolyne Fiscal, Debi Primary Care Sherronda Sweigert: Johny Drilling Other Clinician: Referring Elide Stalzer: Johny Drilling Treating Cinde Ebert/Extender: Ricard Dillon Weeks in Treatment: 5 Wound Status Wound Number: 1 Primary Etiology: Pressure Ulcer Wound Location: Right Sacrum Wound Status: Open Wounding Event: Pressure Injury Date Acquired: 06/14/2016 Weeks Of Treatment: 5 Clustered Wound: No Photos Photo Uploaded By: Alric Quan on 08/02/2016 17:08:58 Wound Measurements Length: (cm) 3 Width: (cm) 2.7 Depth: (cm) 1 Area: (cm) 6.362 Volume: (cm) 6.362 % Reduction in Area: 83.2% % Reduction in Volume: 16.2% Epithelialization: None Tunneling: No Undermining: No Wound Description Classification: Category/Stage IV Wound Margin: Distinct, outline attached Exudate Amount: Large Exudate Type: Serosanguineous Exudate Color: red, brown Foul Odor After Cleansing: No Slough/Fibrino Yes Wound Bed Granulation Amount: None Present (0%) Exposed Structure Necrotic Amount: Large (67-100%) Fascia Exposed: No Necrotic Quality: Eschar, Adherent Slough Fat Layer (Subcutaneous Tissue) Exposed: Yes Tendon Exposed: No Nole, Faizon K. ([id number]) Muscle Exposed: Yes Necrosis of Muscle: Yes Joint Exposed: No Bone Exposed: No Periwound Skin Texture Texture Color No Abnormalities Noted: No No Abnormalities Noted: No Induration: Yes Erythema: Yes Erythema Location: Circumferential Moisture Rubor: Yes No Abnormalities Noted: No Dry / Scaly: Yes Temperature / Pain Temperature: No Abnormality Tenderness on Palpation: Yes Wound Preparation Ulcer Cleansing: Rinsed/Irrigated with  Saline Topical Anesthetic Applied: Other: lidocaine 4%, Treatment Notes Wound #1 (Right Sacrum) 1. Cleansed with: Clean wound with Normal Saline 2. Anesthetic Topical Lidocaine 4% cream to wound bed prior to debridement 3. Peri-wound Care: Skin Prep 4. Dressing Applied: Santyl Ointment 5. Secondary Dressing Applied Bordered Foam Dressing Dry Gauze Saline moistened guaze Electronic Signature(s) Signed: 08/02/2016 5:31:33 PM By: Alric Quan Entered By: Alric Quan on 08/02/2016 12:54:08 [place], [name] ([id number]) -------------------------------------------------------------------------------- Wound Assessment Details Patient Name: [name] Date of Service: 08/02/2016 12:30 PM Medical Record Number: [id number] Patient Account Number: [id number] Date of Birth/Sex: [birth year] (76 y.o. Male) Treating RN: Ahmed Prima Primary Care Rhylei Mcquaig: Johny Drilling Other Clinician: Referring Sincere Berlanga: Kym Groom  Mario Treating Arena Lindahl/Extender: Ricard Dillon Weeks in Treatment: 5 Wound Status Wound Number: 7 Primary Etiology: Pressure Ulcer Wound Location: Left Sacrum Wound Status: Open Wounding Event: Pressure Injury Date Acquired: 07/03/2016 Weeks Of Treatment: 1 Clustered Wound: No Photos Photo Uploaded By: Alric Quan on 08/02/2016 17:13:00 Wound Measurements Length: (cm) 0.2 Width: (cm) 0.2 Depth: (cm) 0.1 Area: (cm) 0.031 Volume: (cm) 0.003 % Reduction in Area: 96.1% % Reduction in Volume: 96.2% Epithelialization: Large (67-100%) Tunneling: No Undermining: No Wound Description Classification: Category/Stage II Wound Margin: Flat and Intact Exudate Amount: Medium Exudate Type: Serous Exudate Color: amber Foul Odor After Cleansing: No Slough/Fibrino Yes Wound Bed Granulation Amount: None Present (0%) Exposed Structure Necrotic Amount: Large (67-100%) Fascia Exposed: No Necrotic Quality: Adherent Slough Fat Layer (Subcutaneous  Tissue) Exposed: No Tendon Exposed: No Raffel, Nickalus K. ([id number]) Muscle Exposed: No Joint Exposed: No Bone Exposed: No Limited to Skin Breakdown Periwound Skin Texture Texture Color No Abnormalities Noted: No No Abnormalities Noted: No Callus: No Atrophie Blanche: No Crepitus: No Cyanosis: No Excoriation: Yes Ecchymosis: No Induration: No Erythema: Yes Rash: No Erythema Location: Circumferential Scarring: No Hemosiderin Staining: No Mottled: No Moisture Pallor: No No Abnormalities Noted: No Rubor: No Dry / Scaly: No Maceration: No Wound Preparation Ulcer Cleansing: Rinsed/Irrigated with Saline Topical Anesthetic Applied: Other: lidociane 4%, Treatment Notes Wound #7 (Left Sacrum) 1. Cleansed with: Clean wound with Normal Saline 2. Anesthetic Topical Lidocaine 4% cream to wound bed prior to debridement 3. Peri-wound Care: Skin Prep 4. Dressing Applied: Santyl Ointment 5. Secondary Dressing Applied Bordered Foam Dressing Dry Gauze Saline moistened guaze Electronic Signature(s) Signed: 08/02/2016 5:31:33 PM By: Alric Quan Entered By: Alric Quan on 08/02/2016 12:54:31 [place], [name] ([id number]) -------------------------------------------------------------------------------- [place] Details Patient Name: [name] Date of Service: 08/02/2016 12:30 PM Medical Record Number: [id number] Patient Account Number: [id number] Date of Birth/Sex: November 30, 1940 (76 y.o. Male) Treating RN: Ahmed Prima Primary Care Mike Hamre: Johny Drilling Other Clinician: Referring Carnita Golob: Johny Drilling Treating Eyad Rochford/Extender: Tito Dine in Treatment: 5 Vital Signs Time Taken: 12:45 Temperature (F): 98.1 Height (in): 69 Pulse (bpm): 58 Weight (lbs): 147.5 Respiratory Rate (breaths/min): 16 Body Mass Index (BMI): 21.8 Blood Pressure (mmHg): 119/60 Reference Range: 80 - 120 mg / dl Electronic Signature(s) Signed: 08/02/2016 5:31:33 PM  By: Alric Quan Entered By: Alric Quan on 08/02/2016 12:46:09

## 2016-08-03 NOTE — Progress Notes (Addendum)
Charles, [name] ([id number]) Visit Report for 08/02/2016 Debridement Details Patient Name: [name], [name] Date of Service: 08/02/2016 12:30 PM Medical Record Patient Account Number: [id number] Number: Treating RN: Ahmed Prima 1940/05/18 (76 y.o. Other Clinician: Date of Birth/Sex: Male) Treating ,  Primary Care Provider: Johny Drilling Provider/Extender: G Referring Provider: Nettie Elm in Treatment: 5 Debridement Performed for Wound #1 Right Sacrum Assessment: Performed By: Physician Ricard Dillon, MD Debridement: Debridement Pre-procedure Verification/Time Out Yes - 13:09 Taken: Start Time: 13:10 Pain Control: Lidocaine 4% Topical Solution Level: Skin/Subcutaneous Tissue/Muscle Total Area Debrided (L x 3 (cm) x 2.7 (cm) = 8.1 (cm) W): Tissue and other Exudate, Fibrin/Slough, Muscle, Subcutaneous material debrided: Instrument: Curette Bleeding: Minimum Hemostasis Achieved: Pressure End Time: 13:13 Procedural Pain: 0 Post Procedural Pain: 0 Response to Treatment: Procedure was tolerated well Post Debridement Measurements of Total Wound Length: (cm) 3 Stage: Category/Stage IV Width: (cm) 2.8 Depth: (cm) 1.2 Volume: (cm) 7.917 Character of Wound/Ulcer Post Requires Further Debridement: Debridement Post Procedure Diagnosis Same as Pre-procedure Electronic Signature(s) BRANSON, KRANZ ([id number]) Signed: 08/04/2016 3:50:17 PM By: Linton Ham MD Signed: 08/04/2016 4:42:53 PM By: Alric Quan Previous Signature: 08/02/2016 5:31:33 PM Version By: Alric Quan Previous Signature: 08/02/2016 6:05:23 PM Version By: Linton Ham MD Entered By: Alric Quan on 08/04/2016 14:10:15 [place], [name] ([id number]) -------------------------------------------------------------------------------- HPI Details Patient Name: [name] Date of Service: 08/02/2016 12:30 PM Medical Record Patient Account Number:  [id number] Number: Treating RN: Ahmed Prima 1940/10/18 (76 y.o. Other Clinician: Date of Birth/Sex: Male) Treating ,  Primary Care Provider: Johny Drilling Provider/Extender: G Referring Provider: Nettie Elm in Treatment: 5 History of Present Illness HPI Description: 06/28/16; this is a 76 year old man who was admitted to hospital from 6/13 through 06/18/16 at [facility] after being found down in a closet in his home. It is not exactly clear how long he was actually there although it was probably for days. His total CK maximum was at 2582. He was admitted with delirium, possibly heat related illness, possible subendocardial MI. An MRI of the brain was negative. During this hospitalization he was noted to have areas of pressure-related injury for the time he was spent on the floor on his back he was stated to have a stage II pressure ulcer over his buttocks. Multiple excoriations were also noted. He has advanced home care about doing physical therapy there requesting a nurse which seems reasonable. The patient is a diabetic on metformin. They're using bacitracin to all these areas. He was prescribed doxycycline which I believe he is still on. 07/05/16; patient with pressure ulcers on his right greater than left buttock surrounding his coccyx. Excoriation over the right lateral ankle an excoriation on his upper back. They did not get any Santyl or medihoney. We will take care of this today. 07/12/16 unfortunately patient's wounds appeared to be doing better across the board and the eschar that is overlying the sacral wound has loosened up to where I could debride somewhat today. There's no evidence of 07/20/16; butterfly shaped wound across the sacrum and the surrounding skin and soft tissue. The area on the right is much larger than the left. The area on the left looks healthy on the right still and necrotic nonviable surface. Using Santyl 07/26/16;  butterfly shaped wound across the sacrum and surrounding skin and soft tissue. The area on the right much larger than the left. Extensive debridement on the right last week post-debridement culture showed enterococcus faecalis. This is  ampicillin sensitive and he'll definitely need Augmentin. We have been using Santyl.Kermit Balo improvement this week 08/02/16; butterfly shaped wound across the sacrum and surrounding skin and soft tissue. The area on the left is just about closed the area on the right has settled into a stage III wound initially unstageable on arrival. This will slow bleed ongoing debridement both mechanically and enzymatically although we are making good progress each week. Using Santyl/moist gauze/border foam Electronic Signature(s) Signed: 08/02/2016 6:05:23 PM By: Linton Ham MD Entered By: Linton Ham on 08/02/2016 13:18:12 [place], [name] ([id number]) -------------------------------------------------------------------------------- Physical Exam Details Patient Name: [name] Date of Service: 08/02/2016 12:30 PM Medical Record Patient Account Number: [id number] Number: Treating RN: Ahmed Prima Mar 21, 1940 (76 y.o. Other Clinician: Date of Birth/Sex: Male) Treating ,  Primary Care Provider: Johny Drilling Provider/Extender: G Referring Provider: Nettie Elm in Treatment: 5 Constitutional Sitting or standing Blood Pressure is within target range for patient.. Pulse regular and within target range for patient.Marland Kitchen Respirations regular, non-labored and within target range.. Temperature is normal and within the target range for the patient.Marland Kitchen appears in no distress. Notes Wound exam the area on the left side is small and healthy. On the right he continues to require aggressive debridement with a #5 curet removing necrotic surface material, nonviable subcutaneous tissue and necrotic muscle. This is continuing to clean up quite  nicely every week. Hemostasis with direct pressure dressing. He tolerates this well there is no evidence of surrounding infection no soft tissue erythema or crepitus Electronic Signature(s) Signed: 08/02/2016 6:05:23 PM By: Linton Ham MD Entered By: Linton Ham on 08/02/2016 13:19:57 Nevins, [name] ([id number]) -------------------------------------------------------------------------------- Physician Orders Details Patient Name: [name] Date of Service: 08/02/2016 12:30 PM Medical Record Patient Account Number: [id number] Number: Treating RN: Ahmed Prima 16-Jul-1940 (75 y.o. Other Clinician: Date of Birth/Sex: Male) Treating ,  Primary Care Provider: Johny Drilling Provider/Extender: G Referring Provider: Nettie Elm in Treatment: 5 Verbal / Phone Orders: Yes Clinician: Carolyne Fiscal, Debi Read Back and Verified: Yes Diagnosis Coding ICD-10 Coding Code Description L89.150 Pressure ulcer of sacral region, unstageable S91.011D Laceration without foreign body, right ankle, subsequent encounter Laceration without foreign body of unspecified back wall of thorax without penetration into S21.219D thoracic cavity, subsequent encounter E11.622 Type 2 diabetes mellitus with other skin ulcer Wound Cleansing Wound #1 Right Sacrum o Cleanse wound with mild soap and water Wound #7 Left Sacrum o Cleanse wound with mild soap and water Anesthetic Wound #1 Right Sacrum o Topical Lidocaine 4% cream applied to wound bed prior to debridement Wound #7 Left Sacrum o Topical Lidocaine 4% cream applied to wound bed prior to debridement Primary Wound Dressing Wound #1 Right Sacrum o Santyl Ointment Wound #7 Left Sacrum o Santyl Ointment Secondary Dressing Wound #1 Right Sacrum o Dry Gauze o Saline moistened gauze o Boardered Foam Dressing Hartt, Ricco K. ([id number]) Wound #7 Left Sacrum o Saline moistened gauze o  Boardered Foam Dressing Dressing Change Frequency Wound #1 Right Sacrum o Change dressing every day. Wound #7 Left Sacrum o Change dressing every day. Follow-up Appointments Wound #1 Right Sacrum o Return Appointment in 1 week. Wound #7 Left Sacrum o Return Appointment in 1 week. Off-Loading Wound #1 Right Sacrum o Turn and reposition every 2 hours Wound #7 Left Sacrum o Turn and reposition every 2 hours Patient Medications Allergies: NKDA Notifications Medication Indication Start End Santyl pressure ulcer. 08/02/2016 Ongoing rx DOSE topical 250 unit/gram ointment - ointment topical wiht wound  change Electronic Signature(s) Signed: 08/02/2016 5:31:33 PM By: Alric Quan Signed: 08/02/2016 6:05:23 PM By: Linton Ham MD Previous Signature: 08/02/2016 1:26:16 PM Version By: Linton Ham MD Entered By: Alric Quan on 08/02/2016 13:54:27 [place], [name] ([id number]) -------------------------------------------------------------------------------- Problem List Details Patient Name: [name] Date of Service: 08/02/2016 12:30 PM Medical Record Patient Account Number: [id number] Number: Treating RN: Ahmed Prima 1940/03/21 (75 y.o. Other Clinician: Date of Birth/Sex: Male) Treating ,  Primary Care Provider: Johny Drilling Provider/Extender: G Referring Provider: Nettie Elm in Treatment: 5 Active Problems ICD-10 Encounter Code Description Active Date Diagnosis L89.150 Pressure ulcer of sacral region, unstageable 06/28/2016 Yes S91.011D Laceration without foreign body, right ankle, subsequent 06/28/2016 Yes encounter S21.219D Laceration without foreign body of unspecified back wall 06/28/2016 Yes of thorax without penetration into thoracic cavity, subsequent encounter E11.622 Type 2 diabetes mellitus with other skin ulcer 06/28/2016 Yes Inactive Problems Resolved Problems Electronic Signature(s) Signed:  08/02/2016 6:05:23 PM By: Linton Ham MD Entered By: Linton Ham on 08/02/2016 13:16:30 [place], [name] ([id number]) -------------------------------------------------------------------------------- Progress Note Details Patient Name: [name] Date of Service: 08/02/2016 12:30 PM Medical Record Patient Account Number: [id number] Number: Treating RN: Ahmed Prima Jan 12, 1940 (75 y.o. Other Clinician: Date of Birth/Sex: Male) Treating ,  Primary Care Provider: Johny Drilling Provider/Extender: G Referring Provider: Nettie Elm in Treatment: 5 Subjective History of Present Illness (HPI) 06/28/16; this is a 76 year old man who was admitted to hospital from 6/13 through 06/18/16 at [facility] after being found down in a closet in his home. It is not exactly clear how long he was actually there although it was probably for days. His total CK maximum was at 2582. He was admitted with delirium, possibly heat related illness, possible subendocardial MI. An MRI of the brain was negative. During this hospitalization he was noted to have areas of pressure-related injury for the time he was spent on the floor on his back he was stated to have a stage II pressure ulcer over his buttocks. Multiple excoriations were also noted. He has advanced home care about doing physical therapy there requesting a nurse which seems reasonable. The patient is a diabetic on metformin. They're using bacitracin to all these areas. He was prescribed doxycycline which I believe he is still on. 07/05/16; patient with pressure ulcers on his right greater than left buttock surrounding his coccyx. Excoriation over the right lateral ankle an excoriation on his upper back. They did not get any Santyl or medihoney. We will take care of this today. 07/12/16 unfortunately patient's wounds appeared to be doing better across the board and the eschar that is overlying the sacral  wound has loosened up to where I could debride somewhat today. There's no evidence of 07/20/16; butterfly shaped wound across the sacrum and the surrounding skin and soft tissue. The area on the right is much larger than the left. The area on the left looks healthy on the right still and necrotic nonviable surface. Using Santyl 07/26/16; butterfly shaped wound across the sacrum and surrounding skin and soft tissue. The area on the right much larger than the left. Extensive debridement on the right last week post-debridement culture showed enterococcus faecalis. This is ampicillin sensitive and he'll definitely need Augmentin. We have been using Santyl.Kermit Balo improvement this week 08/02/16; butterfly shaped wound across the sacrum and surrounding skin and soft tissue. The area on the left is just about closed the area on the right has settled into a stage III wound  initially unstageable on arrival. This will slow bleed ongoing debridement both mechanically and enzymatically although we are making good progress each week. Using Santyl/moist gauze/border foam Labrum, Maliq K. ([id number]) Objective Constitutional Sitting or standing Blood Pressure is within target range for patient.. Pulse regular and within target range for patient.Marland Kitchen Respirations regular, non-labored and within target range.. Temperature is normal and within the target range for the patient.Marland Kitchen appears in no distress. Vitals Time Taken: 12:45 PM, Height: 69 in, Weight: 147.5 lbs, BMI: 21.8, Temperature: 98.1 F, Pulse: 58 bpm, Respiratory Rate: 16 breaths/min, Blood Pressure: 119/60 mmHg. General Notes: Wound exam the area on the left side is small and healthy. On the right he continues to require aggressive debridement with a #5 curet removing necrotic surface material, nonviable subcutaneous tissue and necrotic muscle. This is continuing to clean up quite nicely every week. Hemostasis with direct pressure dressing. He tolerates  this well there is no evidence of surrounding infection no soft tissue erythema or crepitus Integumentary (Hair, Skin) Wound #1 status is Open. Original cause of wound was Pressure Injury. The wound is located on the Right Sacrum. The wound measures 3cm length x 2.7cm width x 1cm depth; 6.362cm^2 area and 6.362cm^3 volume. There is muscle and Fat Layer (Subcutaneous Tissue) Exposed exposed. There is no tunneling or undermining noted. There is a large amount of serosanguineous drainage noted. The wound margin is distinct with the outline attached to the wound base. There is no granulation within the wound bed. There is a large (67-100%) amount of necrotic tissue within the wound bed including Eschar, Adherent Slough and Necrosis of Muscle. The periwound skin appearance exhibited: Induration, Dry/Scaly, Rubor, Erythema. The surrounding wound skin color is noted with erythema which is circumferential. Periwound temperature was noted as No Abnormality. The periwound has tenderness on palpation. Wound #7 status is Open. Original cause of wound was Pressure Injury. The wound is located on the Left Sacrum. The wound measures 0.2cm length x 0.2cm width x 0.1cm depth; 0.031cm^2 area and 0.003cm^3 volume. The wound is limited to skin breakdown. There is no tunneling or undermining noted. There is a medium amount of serous drainage noted. The wound margin is flat and intact. There is no granulation within the wound bed. There is a large (67-100%) amount of necrotic tissue within the wound bed including Adherent Slough. The periwound skin appearance exhibited: Excoriation, Erythema. The periwound skin appearance did not exhibit: Callus, Crepitus, Induration, Rash, Scarring, Dry/Scaly, Maceration, Atrophie Blanche, Cyanosis, Ecchymosis, Hemosiderin Staining, Mottled, Pallor, Rubor. The surrounding wound skin color is noted with erythema which is circumferential. Assessment Active Problems ICD-10 L89.150 -  Pressure ulcer of sacral region, unstageable S91.011D - Laceration without foreign body, right ankle, subsequent encounter Scahill, Courtez K. ([id number]) S21.219D - Laceration without foreign body of unspecified back wall of thorax without penetration into thoracic cavity, subsequent encounter E11.622 - Type 2 diabetes mellitus with other skin ulcer Procedures Wound #1 Pre-procedure diagnosis of Wound #1 is a Pressure Ulcer located on the Right Sacrum . There was a Skin/Subcutaneous Tissue/Muscle Debridement ([id number]) debridement with total area of 8.1 sq cm performed by Ricard Dillon, MD. with the following instrument(s): Curette including Exudate, Fibrin/Slough, and Subcutaneous after achieving pain control using Lidocaine 4% Topical Solution. A time out was conducted at 13:09, prior to the start of the procedure. A Minimum amount of bleeding was controlled with Pressure. The procedure was tolerated well with a pain level of 0 throughout and a pain level of 0 following the  procedure. Post Debridement Measurements: 3cm length x 2.8cm width x 1.2cm depth; 7.917cm^3 volume. Post debridement Stage noted as Category/Stage IV. Character of Wound/Ulcer Post Debridement requires further debridement. Post procedure Diagnosis Wound #1: Same as Pre-Procedure Plan Wound Cleansing: Wound #1 Right Sacrum: Cleanse wound with mild soap and water Wound #7 Left Sacrum: Cleanse wound with mild soap and water Anesthetic: Wound #1 Right Sacrum: Topical Lidocaine 4% cream applied to wound bed prior to debridement Wound #7 Left Sacrum: Topical Lidocaine 4% cream applied to wound bed prior to debridement Primary Wound Dressing: Wound #1 Right Sacrum: Santyl Ointment Wound #7 Left Sacrum: Santyl Ointment Secondary Dressing: Wound #1 Right Sacrum: Saline moistened gauze Boardered Foam Dressing Wound #7 Left Sacrum: Flaum, Kavontae K. ([id number]) Saline moistened gauze Boardered Foam  Dressing Dressing Change Frequency: Wound #1 Right Sacrum: Change dressing every day. Wound #7 Left Sacrum: Change dressing every day. Follow-up Appointments: Wound #1 Right Sacrum: Return Appointment in 1 week. Wound #7 Left Sacrum: Return Appointment in 1 week. Off-Loading: Wound #1 Right Sacrum: Turn and reposition every 2 hours Wound #7 Left Sacrum: Turn and reposition every 2 hours The following medication(s) was prescribed: Santyl topical 250 unit/gram ointment ointment topical wiht wound change for pressure ulcer. Ongoing rx starting 08/02/2016 #1 continue with Santyl/moist gauze/border foam change every 2 days #2 I suspect he is going to require Santyl for about another 2-3 weeks then we can start to look at options to close this in. #3 there is undermining from about 9 to 3:00 this might be something that would be amenable to a VAC Electronic Signature(s) Signed: 08/02/2016 1:26:45 PM By: Linton Ham MD Entered By: Linton Ham on 08/02/2016 13:26:45 Gonser, [name] ([id number]) -------------------------------------------------------------------------------- SuperBill Details Patient Name: [name] Date of Service: 08/02/2016 Medical Record Patient Account Number: [id number] Number: Treating RN: Ahmed Prima Aug 03, 1940 (75 y.o. Other Clinician: Date of Birth/Sex: Male) Treating ,  Primary Care Provider: Johny Drilling Provider/Extender: G Referring Provider: Nettie Elm in Treatment: 5 Diagnosis Coding ICD-10 Codes Code Description L89.150 Pressure ulcer of sacral region, unstageable S91.011D Laceration without foreign body, right ankle, subsequent encounter Laceration without foreign body of unspecified back wall of thorax without penetration into S21.219D thoracic cavity, subsequent encounter E11.622 Type 2 diabetes mellitus with other skin ulcer Facility Procedures CPT4 Code: [id number] Description: 59563 -  DEB MUSC/FASCIA 20 SQ CM/< ICD-10 Description Diagnosis L89.150 Pressure ulcer of sacral region, unstageable Modifier: Quantity: 1 Physician Procedures CPT4 Code: [id number] Description: 29518 - WC PHYS DEBR MUSCLE/FASCIA 20 SQ CM ICD-10 Description Diagnosis L89.150 Pressure ulcer of sacral region, unstageable Modifier: Quantity: 1 Electronic Signature(s) Signed: 08/02/2016 6:05:23 PM By: Linton Ham MD Entered By: Linton Ham on 08/02/2016 13:23:24

## 2016-08-09 ENCOUNTER — Encounter: Payer: Medicare Other | Attending: Physician Assistant | Admitting: Physician Assistant

## 2016-08-09 DIAGNOSIS — S21219D Laceration without foreign body of unspecified back wall of thorax without penetration into thoracic cavity, subsequent encounter: Secondary | ICD-10-CM | POA: Diagnosis not present

## 2016-08-09 DIAGNOSIS — L89150 Pressure ulcer of sacral region, unstageable: Secondary | ICD-10-CM | POA: Insufficient documentation

## 2016-08-09 DIAGNOSIS — X58XXXD Exposure to other specified factors, subsequent encounter: Secondary | ICD-10-CM | POA: Insufficient documentation

## 2016-08-09 DIAGNOSIS — S91011D Laceration without foreign body, right ankle, subsequent encounter: Secondary | ICD-10-CM | POA: Diagnosis not present

## 2016-08-09 DIAGNOSIS — E11622 Type 2 diabetes mellitus with other skin ulcer: Secondary | ICD-10-CM | POA: Insufficient documentation

## 2016-08-10 NOTE — Progress Notes (Signed)
ZALMAN, HULL ([id number]) Visit Report for 08/09/2016 Arrival Information Details Patient Name: [name], [name] Date of Service: 08/09/2016 11:00 AM Medical Record Number: [id number] Patient Account Number: [id number] Date of Birth/Sex: Aug 02, 1940 (75 y.o. Male) Treating RN: [name] Primary Care [name]: [name] Other Clinician: Referring [name]: [name] Treating [name]/Extender: [name], [name] in Treatment: 6 Visit Information History Since Last Visit All ordered tests and consults were completed: No Patient Arrived: [name] Added or deleted any medications: No Arrival Time: 11:10 Any new allergies or adverse reactions: No Accompanied By: daughter Had a fall or experienced change in No Transfer Assistance: EasyPivot activities of daily living that may affect Patient Lift risk of falls: Patient Identification Verified: Yes Signs or symptoms of abuse/neglect since last No Secondary Verification Process Yes visito Completed: Hospitalized since last visit: No Patient Requires Transmission- No Has Dressing in Place as Prescribed: Yes Based Precautions: Pain Present Now: No Patient Has Alerts: Yes Patient Alerts: DM II Electronic Signature(s) Signed: 08/09/2016 5:39:54 PM By: [name] Entered By: [name] on 08/09/2016 11:11:01 [place], [name] ([id number]) -------------------------------------------------------------------------------- Encounter Discharge Information Details Patient Name: [name] Date of Service: 08/09/2016 11:00 AM Medical Record Number: [id number] Patient Account Number: [id number] Date of Birth/Sex: October 07, 1940 (75 y.o. Male) Treating RN: [name] Primary Care [name]: [name] Other Clinician: Referring [name]: [name] Treating [name]/Extender: [name], [name] in Treatment: 6 Encounter Discharge Information Items Discharge Pain Level: 0 Discharge Condition:  Stable Ambulatory Status: Cane Discharge Destination: Home Transportation: Private Auto Accompanied By: daughter Schedule Follow-up Appointment: Yes Medication Reconciliation completed No and provided to Patient/Care Kadra Kohan: Provided on Clinical Summary of Care: 08/09/2016 Form Type Recipient Paper Patient LM Electronic Signature(s) Signed: 08/09/2016 5:39:54 PM By: [name] Previous Signature: 08/09/2016 12:00:21 PM Version By: [name] Entered By: [name] on 08/09/2016 17:15:17 [place], [name] ([id number]) -------------------------------------------------------------------------------- Lower Extremity Assessment Details Patient Name: [name] Date of Service: 08/09/2016 11:00 AM Medical Record Number: [id number] Patient Account Number: [id number] Date of Birth/Sex: [birth year] (76 y.o. Male) Treating RN: [name] Primary Care Leelan Rajewski: [name] Other Clinician: Referring [name]: [name] Treating Fuad Forget/Extender: [name], [name] in Treatment: 6 Electronic Signature(s) Signed: 08/09/2016 5:39:54 PM By: [name] Entered By: [name] on 08/09/2016 17:14:08 [place], [name] ([id number]) -------------------------------------------------------------------------------- Multi Wound Chart Details Patient Name: [name] Date of Service: 08/09/2016 11:00 AM Medical Record Number: [id number] Patient Account Number: [id number] Date of Birth/Sex: 30-Nov-1940 (76 y.o. Male) Treating RN: [name] Primary Care Antuane Eastridge: [name] Other Clinician: Referring Eldra Word: [name] Treating Shearon Clonch/Extender: [name], [name] in Treatment: 6 Vital Signs Height(in): 69 Pulse(bpm): 53 Weight(lbs): 147.5 Blood Pressure 111/86 (mmHg): Body Mass Index(BMI): 22 Temperature(F): 97.8 Respiratory Rate 16 (breaths/min): Photos: [1:No Photos] [7:No Photos] [N/A:N/A] Wound Location: [1:Right Sacrum]  [7:Left Sacrum] [N/A:N/A] Wounding Event: [1:Pressure Injury] [7:Pressure Injury] [N/A:N/A] Primary Etiology: [1:Pressure Ulcer] [7:Pressure Ulcer] [N/A:N/A] Date Acquired: [1:06/14/2016] [7:07/03/2016] [N/A:N/A] [name] of Treatment: [1:6] [7:2] [N/A:N/A] Wound Status: [1:Open] [7:Healed - Epithelialized] [N/A:N/A] Measurements L x W x D 2.7x2.4x0.9 [7:0x0x0] [N/A:N/A] (cm) Area (cm) : [1:5.089] [7:0] [N/A:N/A] Volume (cm) : [1:4.58] [7:0] [N/A:N/A] % Reduction in Area: [1:86.60%] [7:100.00%] [N/A:N/A] % Reduction in Volume: 39.70% [7:100.00%] [N/A:N/A] Classification: [1:Category/Stage IV] [7:Category/Stage II] [N/A:N/A] Exudate Amount: [1:Large] [7:N/A] [N/A:N/A] Exudate Type: [1:Serous] [7:N/A] [N/A:N/A] Exudate Color: [1:amber] [7:N/A] [N/A:N/A] Wound Margin: [1:Distinct, outline attached] [7:N/A] [N/A:N/A] Granulation Amount: [1:None Present (0%)] [7:N/A] [N/A:N/A] Necrotic Amount: [1:Large (67-100%)] [7:N/A] [N/A:N/A] Exposed Structures: [1:Fat  Layer (Subcutaneous Tissue) Exposed: Yes Muscle: Yes Fascia: No Tendon: No Joint: No Bone: No] [7:N/A] [N/A:N/A] Epithelialization: [1:None] [7:N/A] [N/A:N/A] Periwound Skin Texture: Induration: Yes [1:Dry/Scaly: Yes] [7:No Abnormalities Noted No Abnormalities Noted] [N/A:N/A N/A] Periwound Skin Moisture: Periwound Skin Color: Erythema: Yes No Abnormalities Noted N/A Rubor: Yes Erythema Location: Circumferential N/A N/A Temperature: No Abnormality N/A N/A Tenderness on Yes No N/A Palpation: Wound Preparation: Ulcer Cleansing: N/A N/A Rinsed/Irrigated with Saline Topical Anesthetic Applied: Other: lidocaine 4% Treatment Notes Electronic Signature(s) Signed: 08/09/2016 5:39:54 PM By: [name] Entered By: [name] on 08/09/2016 11:44:42 [place], [name] ([id number]) -------------------------------------------------------------------------------- [place] Details Patient Name: [name] Date of Service: 08/09/2016 11:00 AM Medical Record Number: [id number] Patient Account Number: [id number] Date of Birth/Sex: 1940-01-28 (76 y.o. Male) Treating RN: [name] Primary Care Demiyah Fischbach: [name] Other Clinician: Referring [name]: [name] Treating Margurite Duffy/Extender: [name], [name] in Treatment: 6 Active Inactive ` Abuse / Safety / Falls / Self Care Management Nursing Diagnoses: Potential for falls Goals: Patient will not experience any injury related to falls Date Initiated: 06/28/2016 Target Resolution Date: 10/08/2016 Goal Status: Active Interventions: Assess Activities of Daily Living upon admission and as needed Assess: immobility, friction, shearing, incontinence upon admission and as needed Assess impairment of mobility on admission and as needed per policy Notes: ` Nutrition Nursing Diagnoses: Imbalanced nutrition Impaired glucose control: actual or potential Potential for alteratiion in Nutrition/Potential for imbalanced nutrition Goals: Patient/caregiver agrees to and verbalizes understanding of need to use nutritional supplements and/or vitamins as prescribed Date Initiated: 06/28/2016 Target Resolution Date: 09/10/2016 Goal Status: Active Patient/caregiver verbalizes understanding of need to maintain therapeutic glucose control per primary care physician Date Initiated: 06/28/2016 Target Resolution Date: 09/10/2016 Goal Status: Active Interventions: MCADAMSDjuan, [place] ([id number]) Assess patient nutrition upon admission and as needed per policy Notes: ` Orientation to the Wound Care Program Nursing Diagnoses: Knowledge deficit related to the wound healing center program Goals: Patient/caregiver will verbalize understanding of the [place] Program Date Initiated: 06/28/2016 Target Resolution Date: 07/09/2016 Goal Status: Active Interventions: Provide education on orientation to the wound center Notes: ` Pain,  Acute or Chronic Nursing Diagnoses: Pain, acute or chronic: actual or potential Potential alteration in comfort, pain Goals: Patient/caregiver will verbalize adequate pain control between visits Date Initiated: 06/28/2016 Target Resolution Date: 10/08/2016 Goal Status: Active Interventions: Assess comfort goal upon admission Complete pain assessment as per visit requirements Notes: ` Pressure Nursing Diagnoses: Knowledge deficit related to causes and risk factors for pressure ulcer development Knowledge deficit related to management of pressures ulcers Potential for impaired tissue integrity related to pressure, friction, moisture, and shear Goals: Patient will remain free from development of additional pressure ulcers [place], JAEVIAN SHEAN ([id number]) Date Initiated: 06/28/2016 Target Resolution Date: 10/08/2016 Goal Status: Active Interventions: Assess: immobility, friction, shearing, incontinence upon admission and as needed Assess offloading mechanisms upon admission and as needed Notes: ` Wound/Skin Impairment Nursing Diagnoses: Impaired tissue integrity Knowledge deficit related to ulceration/compromised skin integrity Goals: Ulcer/skin breakdown will have a volume reduction of 80% by week 12 Date Initiated: 06/28/2016 Target Resolution Date: 10/01/2016 Goal Status: Active Interventions: Assess patient/caregiver ability to perform ulcer/skin care regimen upon admission and as needed Assess ulceration(s) every visit Notes: Electronic Signature(s) Signed: 08/09/2016 5:39:54 PM By: [name] Entered By: [name] on 08/09/2016 11:44:31 [place], [name] ([id number]) -------------------------------------------------------------------------------- Pain Assessment Details Patient Name: [name] Date of Service: 08/09/2016 11:00 AM Medical Record Number: [id number] Patient Account Number: [id number] Date  of Birth/Sex: [birth year] (76 y.o. Male) Treating RN:  Carolyne Fiscal, Debi Primary Care Pawan Knechtel: [name] Other Clinician: Referring Logen Fowle: [name] Treating Bayleigh Loflin/Extender: [name], [name] in Treatment: 6 Active Problems Location of Pain Severity and Description of Pain Patient Has Paino No Site Locations With Dressing Change: No Pain Management and Medication Current Pain Management: Electronic Signature(s) Signed: 08/09/2016 5:39:54 PM By: [name] Entered By: [name] on 08/09/2016 11:11:09 [place], [name] ([id number]) -------------------------------------------------------------------------------- Patient/Caregiver Education Details Patient Name: [name] Date of Service: 08/09/2016 11:00 AM Medical Record Number: [id number] Patient Account Number: [id number] Date of Birth/Gender: 04/22/1940 (76 y.o. Male) Treating RN: [name] Primary Care Physician: [name] Other Clinician: Referring Physician: Johny [name] Treating Physician/Extender: [name], [name] in Treatment: 6 Education Assessment Education Provided To: Patient and Caregiver daughter Education Topics Provided Wound/Skin Impairment: Handouts: Other: change dressing as ordered Methods: Demonstration, Explain/Verbal Responses: State content correctly Electronic Signature(s) Signed: 08/09/2016 5:39:54 PM By: [name] Entered By: [name] on 08/09/2016 17:15:34 [place], [name] ([id number]) -------------------------------------------------------------------------------- Wound Assessment Details Patient Name: [name] Date of Service: 08/09/2016 11:00 AM Medical Record Number: [id number] Patient Account Number: [id number] Date of Birth/Sex: June 25, 1940 (76 y.o. Male) Treating RN: Carolyne Fiscal, Debi Primary Care Edina Winningham: [name] Other Clinician: Referring Broxton Broady: [name] Treating Deionte Spivack/Extender: [name], [name] in Treatment: 6 Wound Status Wound Number:  1 Primary Etiology: Pressure Ulcer Wound Location: Right Sacrum Wound Status: Open Wounding Event: Pressure Injury Date Acquired: 06/14/2016 [name] Of Treatment: 6 Clustered Wound: No Photos Photo Uploaded By: [name] on 08/09/2016 17:29:52 Wound Measurements Length: (cm) 2.7 Width: (cm) 2.4 Depth: (cm) 0.9 Area: (cm) 5.089 Volume: (cm) 4.58 % Reduction in Area: 86.6% % Reduction in Volume: 39.7% Epithelialization: None Tunneling: No Undermining: No Wound Description Classification: Category/Stage IV Foul Odor Afte Wound Margin: Distinct, outline attached Slough/Fibrino Exudate Amount: Large Exudate Type: Serous Exudate Color: amber r Cleansing: No Yes Wound Bed Granulation Amount: None Present (0%) Exposed Structure Necrotic Amount: Large (67-100%) Fascia Exposed: No Necrotic Quality: Adherent Slough Fat Layer (Subcutaneous Tissue) Exposed: Yes Tendon Exposed: No Ciullo, Ignazio K. ([id number]) Muscle Exposed: Yes Necrosis of Muscle: Yes Joint Exposed: No Bone Exposed: No Periwound Skin Texture Texture Color No Abnormalities Noted: No No Abnormalities Noted: No Induration: Yes Erythema: Yes Erythema Location: Circumferential Moisture Rubor: Yes No Abnormalities Noted: No Dry / Scaly: Yes Temperature / Pain Temperature: No Abnormality Tenderness on Palpation: Yes Wound Preparation Ulcer Cleansing: Rinsed/Irrigated with Saline Topical Anesthetic Applied: Other: lidocaine 4%, Treatment Notes Wound #1 (Right Sacrum) 1. Cleansed with: Clean wound with Normal Saline 2. Anesthetic Topical Lidocaine 4% cream to wound bed prior to debridement 3. Peri-wound Care: Skin Prep 4. Dressing Applied: Santyl Ointment Saline moistened guaze 5. Secondary Dressing Applied Bordered Foam Dressing Dry Gauze Electronic Signature(s) Signed: 08/09/2016 5:39:54 PM By: [name] Entered By: [name] on 08/09/2016 11:23:06 [place], [name]  ([id number]) -------------------------------------------------------------------------------- Wound Assessment Details Patient Name: [name] Date of Service: 08/09/2016 11:00 AM Medical Record Number: [id number] Patient Account Number: [id number] Date of Birth/Sex: 1940-03-28 (76 y.o. Male) Treating RN: [name] Primary Care [place]: [name] Other Clinician: Referring Freeman Borba: [name] Treating Yasemin Rabon/Extender: [name], [name] in Treatment: 6 Wound Status Wound Number: 7 Primary Etiology: Pressure Ulcer Wound Location: Left Sacrum Wound Status: Healed - Epithelialized Wounding Event: Pressure Injury Date Acquired: 07/03/2016 [name] Of Treatment: 2 Clustered Wound: No Photos Photo Uploaded By: [name] on 08/09/2016 17:29:52 Wound Measurements Length: (  cm) 0 % Reduction in Width: (cm) 0 % Reduction in Depth: (cm) 0 Area: (cm) 0 Volume: (cm) 0 Area: 100% Volume: 100% Wound Description Classification: Category/Stage II Periwound Skin Texture Texture Color No Abnormalities Noted: No No Abnormalities Noted: No Moisture No Abnormalities Noted: No Electronic Signature(s) Signed: 08/09/2016 5:39:54 PM By: Olean Ree, [name] ([id number]) Entered By: [name] on 08/09/2016 11:42:51 [place], [name] ([id number]) -------------------------------------------------------------------------------- Vitals Details Patient Name: [name] Date of Service: 08/09/2016 11:00 AM Medical Record Number: [id number] Patient Account Number: [id number] Date of Birth/Sex: 1940/05/12 (76 y.o. Male) Treating RN: [name] Primary Care Loleta Frommelt: [name] Other Clinician: Referring Chantay Whitelock: [name] Treating Evvie Behrmann/Extender: [name], [name] in Treatment: 6 Vital Signs Time Taken: 11:11 Temperature (F): 97.8 Height (in): 69 Pulse (bpm): 53 Weight (lbs): 147.5 Respiratory Rate (breaths/min):  16 Body Mass Index (BMI): 21.8 Blood Pressure (mmHg): 111/86 Reference Range: 80 - 120 mg / dl Electronic Signature(s) Signed: 08/09/2016 5:39:54 PM By: [name] Entered By: [name] on 08/09/2016 11:11:36

## 2016-08-10 NOTE — Progress Notes (Signed)
KAEO, JACOME ([id number]) Visit Report for 08/09/2016 Chief Complaint Document Details Patient Name: [name], [name] Date of Service: 08/09/2016 11:00 AM Medical Record Number: [id number] Patient Account Number: [id number] Date of Birth/Sex: 1940/07/22 (75 y.o. Male) Treating RN: Montey Hora Primary Care Provider: Johny Drilling Other Clinician: Referring Provider: Johny Drilling Treating Provider/Extender: Melburn Hake, HOYT Weeks in Treatment: 6 Information Obtained from: Patient Chief Complaint 06/28/16; patient is here accompanied by his daughter for review of multiple pressure ulcerations predominantly his lower sacrum/coccyx and surrounding buttock's Electronic Signature(s) Signed: 08/09/2016 5:26:05 PM By: Worthy Keeler PA-C Entered By: Worthy Keeler on 08/09/2016 13:36:21 [place], [name] ([id number]) -------------------------------------------------------------------------------- Debridement Details Patient Name: [name] Date of Service: 08/09/2016 11:00 AM Medical Record Number: [id number] Patient Account Number: [id number] Date of Birth/Sex: 1940-04-24 (76 y.o. Male) Treating RN: Ahmed Prima Primary Care Provider: Johny Drilling Other Clinician: Referring Provider: Johny Drilling Treating Provider/Extender: Melburn Hake, HOYT Weeks in Treatment: 6 Debridement Performed for Wound #1 Right Sacrum Assessment: Performed By: Physician STONE III, HOYT E., PA-C Debridement: Debridement Pre-procedure Verification/Time Out Yes - 11:43 Taken: Start Time: 11:43 Pain Control: Lidocaine 4% Topical Solution Level: Skin/Subcutaneous Tissue Total Area Debrided (L x 2.7 (cm) x 2.4 (cm) = 6.48 (cm) W): Tissue and other Viable, Non-Viable, Eschar, Fibrin/Slough, Subcutaneous material debrided: Instrument: Curette Bleeding: Minimum Hemostasis Achieved: Pressure End Time: 11:50 Procedural Pain: 0 Post Procedural Pain: 0 Response to Treatment: Procedure was  tolerated well Post Debridement Measurements of Total Wound Length: (cm) 2.7 Stage: Category/Stage IV Width: (cm) 2.4 Depth: (cm) 1 Volume: (cm) 5.089 Character of Wound/Ulcer Post Improved Debridement: Post Procedure Diagnosis Same as Pre-procedure Electronic Signature(s) Signed: 08/09/2016 5:26:05 PM By: Worthy Keeler PA-C Signed: 08/09/2016 5:39:54 PM By: Alric Quan Entered By: Alric Quan on 08/09/2016 11:50:39 [place], [name] ([id number]) -------------------------------------------------------------------------------- HPI Details Patient Name: [name] Date of Service: 08/09/2016 11:00 AM Medical Record Number: [id number] Patient Account Number: [id number] Date of Birth/Sex: Aug 08, 1940 (76 y.o. Male) Treating RN: Montey Hora Primary Care Provider: Johny Drilling Other Clinician: Referring Provider: Johny Drilling Treating Provider/Extender: Melburn Hake, HOYT Weeks in Treatment: 6 History of Present Illness HPI Description: 06/28/16; this is a 76 year old man who was admitted to hospital from 6/13 through 06/18/16 at [facility] regional after being found down in a closet in his home. It is not exactly clear how long he was actually there although it was probably for days. His total CK maximum was at 2582. He was admitted with delirium, possibly heat related illness, possible subendocardial MI. An MRI of the brain was negative. During this hospitalization he was noted to have areas of pressure-related injury for the time he was spent on the floor on his back he was stated to have a stage II pressure ulcer over his buttocks. Multiple excoriations were also noted. He has advanced home care about doing physical therapy there requesting a nurse which seems reasonable. The patient is a diabetic on metformin. They're using bacitracin to all these areas. He was prescribed doxycycline which I believe he is still on. 07/05/16; patient with pressure ulcers on his right  greater than left buttock surrounding his coccyx. Excoriation over the right lateral ankle an excoriation on his upper back. They did not get any Santyl or medihoney. We [name] take care of this today. 07/12/16 unfortunately patient's wounds appeared to be doing better across the board and the eschar that is overlying the sacral wound has loosened up to where I could debride somewhat today.  There's no evidence of 07/20/16; butterfly shaped wound across the sacrum and the surrounding skin and soft tissue. The area on the right is much larger than the left. The area on the left looks healthy on the right still and necrotic nonviable surface. Using Santyl 07/26/16; butterfly shaped wound across the sacrum and surrounding skin and soft tissue. The area on the right much larger than the left. Extensive debridement on the right last [name] post-debridement culture showed enterococcus faecalis. This is ampicillin sensitive and he'll definitely need Augmentin. We have been using Santyl.[name] 08/02/16; butterfly shaped wound across the sacrum and surrounding skin and soft tissue. The area on the left is just about closed the area on the right has settled into a stage III wound initially unstageable on arrival. This [name] slow bleed ongoing debridement both mechanically and enzymatically although we are making good progress each [name]. Using Santyl/moist gauze/border foam 08/09/16 on evaluation today patient presents with a slough covered sacral wound but he tells me he experiences some mild discomfort regarding. This pain seems to be worse mainly because cleansing and he rates this to be a 2 out of 10. With that being said he does seem to be unfortunately after last [name]'s debridement she states that he had a snack a lot of bleeding upon arrival at home making some progress with the Santyl at this point. His daughter actually does the dressing changes for him daily. Wish they had not noted up  until that point. Otherwise things have been going well in regard to treatment. Patient has no nausea, vomiting, or diarrhea as well as no fever or chills. Electronic Signature(s) Signed: 08/09/2016 5:26:05 PM By: Irean Hong Sleeth, Mikal K. ([id number]) Entered By: Worthy Keeler on 08/09/2016 13:38:43 [name], [name] ([id number]) -------------------------------------------------------------------------------- Physical Exam Details Patient Name: [name] Date of Service: 08/09/2016 11:00 AM Medical Record Number: [id number] Patient Account Number: [id number] Date of Birth/Sex: March 28, 1940 (75 y.o. Male) Treating RN: Montey Hora Primary Care Provider: Johny Drilling Other Clinician: Referring Provider: Johny Drilling Treating Provider/Extender: Melburn Hake, HOYT Weeks in Treatment: 6 Constitutional Well-nourished and well-hydrated in no acute distress. Respiratory normal breathing without difficulty. Psychiatric this patient is able to make decisions and demonstrates good insight into disease process. Alert and Oriented x 3. pleasant and cooperative. Notes Patient's wound on evaluation today was slough covered with some granular tissue at the 6 o'clock location starting to show through. This did require sharp debridement and Slough was removed along with subcutaneous tissue and this was debride it down to a fairly good wound bed over a majority of the wound though there is some Slough still noted centrally and at the 12 o'clock location that was not able to be easily removed during debridement. Patient tolerated debridement well. Electronic Signature(s) Signed: 08/09/2016 5:26:05 PM By: Worthy Keeler PA-C Entered By: Worthy Keeler on 08/09/2016 13:43:54 [name], [name] ([id number]) -------------------------------------------------------------------------------- Physician Orders Details Patient Name: [name] Date of Service: 08/09/2016 11:00 AM Medical  Record Number: [id number] Patient Account Number: [id number] Date of Birth/Sex: September 17, 1940 (76 y.o. Male) Treating RN: Ahmed Prima Primary Care Provider: Johny Drilling Other Clinician: Referring Provider: Johny Drilling Treating Provider/Extender: Melburn Hake, HOYT Weeks in Treatment: 6 Verbal / Phone Orders: No Diagnosis Coding ICD-10 Coding Code Description L89.150 Pressure ulcer of sacral region, unstageable S91.011D Laceration without foreign body, right ankle, subsequent encounter Laceration without foreign body of unspecified back wall of thorax without penetration into S21.219D  thoracic cavity, subsequent encounter E11.622 Type 2 diabetes mellitus with other skin ulcer Wound Cleansing Wound #1 Right Sacrum o Cleanse wound with mild soap and water Anesthetic Wound #1 Right Sacrum o Topical Lidocaine 4% cream applied to wound bed prior to debridement Primary Wound Dressing Wound #1 Right Sacrum o Santyl Ointment Secondary Dressing Wound #1 Right Sacrum o Dry Gauze o Saline moistened gauze o Boardered Foam Dressing Dressing Change Frequency Wound #1 Right Sacrum o Change dressing every day. Follow-up Appointments Wound #1 Right Sacrum o Return Appointment in 1 [name]. [name], [name] ([id number]) Off-Loading Wound #1 Right Sacrum o Turn and reposition every 2 hours Notes I'm going to recommend that we continue with the Santyl per the above wound care orders for the next [name]. We [name] see him for reevaluation at that point. If anything worsens in the interim patient [name] contact our office for additional recommendations. Otherwise hopefully he [name] continue to show signs of improvement [name] by [name]. If anything worsens in the interim patient [name] contact our office for additional recommendations otherwise we [name] see him for reevaluation in one [name]. Electronic Signature(s) Signed: 08/09/2016 5:26:05 PM By: Worthy Keeler PA-C Entered By: Worthy Keeler on 08/09/2016 13:45:09 [name], [name] ([id number]) -------------------------------------------------------------------------------- Problem List Details Patient Name: [name] Date of Service: 08/09/2016 11:00 AM Medical Record Number: [id number] Patient Account Number: [id number] Date of Birth/Sex: 07/22/1940 (76 y.o. Male) Treating RN: Montey Hora Primary Care Provider: Johny Drilling Other Clinician: Referring Provider: Johny Drilling Treating Provider/Extender: Melburn Hake, HOYT Weeks in Treatment: 6 Active Problems ICD-10 Encounter Code Description Active Date Diagnosis L89.150 Pressure ulcer of sacral region, unstageable 06/28/2016 Yes S91.011D Laceration without foreign body, right ankle, subsequent 06/28/2016 Yes encounter S21.219D Laceration without foreign body of unspecified back wall 06/28/2016 Yes of thorax without penetration into thoracic cavity, subsequent encounter E11.622 Type 2 diabetes mellitus with other skin ulcer 06/28/2016 Yes Inactive Problems Resolved Problems Electronic Signature(s) Signed: 08/09/2016 5:26:05 PM By: Worthy Keeler PA-C Entered By: Worthy Keeler on 08/09/2016 11:41:14 [name], [name] ([id number]) -------------------------------------------------------------------------------- Progress Note Details Patient Name: [name] Date of Service: 08/09/2016 11:00 AM Medical Record Number: [id number] Patient Account Number: [id number] Date of Birth/Sex: 01/15/1940 (76 y.o. Male) Treating RN: Montey Hora Primary Care Provider: Johny Drilling Other Clinician: Referring Provider: Johny Drilling Treating Provider/Extender: Melburn Hake, HOYT Weeks in Treatment: 6 Subjective Chief Complaint Information obtained from Patient 06/28/16; patient is here accompanied by his daughter for review of multiple pressure ulcerations predominantly his lower sacrum/coccyx and surrounding buttock's History of Present Illness  (HPI) 06/28/16; this is a 76 year old man who was admitted to hospital from 6/13 through 06/18/16 at [facility] regional after being found down in a closet in his home. It is not exactly clear how long he was actually there although it was probably for days. His total CK maximum was at 2582. He was admitted with delirium, possibly heat related illness, possible subendocardial MI. An MRI of the brain was negative. During this hospitalization he was noted to have areas of pressure-related injury for the time he was spent on the floor on his back he was stated to have a stage II pressure ulcer over his buttocks. Multiple excoriations were also noted. He has advanced home care about doing physical therapy there requesting a nurse which seems reasonable. The patient is a diabetic on metformin. They're using bacitracin to all these areas. He was prescribed doxycycline which I believe he is still on. 07/05/16;  patient with pressure ulcers on his right greater than left buttock surrounding his coccyx. Excoriation over the right lateral ankle an excoriation on his upper back. They did not get any Santyl or medihoney. We [name] take care of this today. 07/12/16 unfortunately patient's wounds appeared to be doing better across the board and the eschar that is overlying the sacral wound has loosened up to where I could debride somewhat today. There's no evidence of 07/20/16; butterfly shaped wound across the sacrum and the surrounding skin and soft tissue. The area on the right is much larger than the left. The area on the left looks healthy on the right still and necrotic nonviable surface. Using Santyl 07/26/16; butterfly shaped wound across the sacrum and surrounding skin and soft tissue. The area on the right much larger than the left. Extensive debridement on the right last [name] post-debridement culture showed enterococcus faecalis. This is ampicillin sensitive and he'll definitely need Augmentin. We have been  using Santyl.[name] 08/02/16; butterfly shaped wound across the sacrum and surrounding skin and soft tissue. The area on the left is just about closed the area on the right has settled into a stage III wound initially unstageable on arrival. This [name] slow bleed ongoing debridement both mechanically and enzymatically although we are making good progress each [name]. Using Santyl/moist gauze/border foam 08/09/16 on evaluation today patient presents with a slough covered sacral wound but he tells me he experiences some mild discomfort regarding. This pain seems to be worse mainly because cleansing and he rates this to be a 2 out of 10. With that being said he does seem to be unfortunately after last [name]'s debridement she states that he had a snack a lot of bleeding upon arrival at home making some progress [name], Charles K. ([id number]) with the Santyl at this point. His daughter actually does the dressing changes for him daily. Wish they had not noted up until that point. Otherwise things have been going well in regard to treatment. Patient has no nausea, vomiting, or diarrhea as well as no fever or chills. Objective Constitutional Well-nourished and well-hydrated in no acute distress. Vitals Time Taken: 11:11 AM, Height: 69 in, Weight: 147.5 lbs, BMI: 21.8, Temperature: 97.8 F, Pulse: 53 bpm, Respiratory Rate: 16 breaths/min, Blood Pressure: 111/86 mmHg. Respiratory normal breathing without difficulty. Psychiatric this patient is able to make decisions and demonstrates good insight into disease process. Alert and Oriented x 3. pleasant and cooperative. General Notes: Patient's wound on evaluation today was slough covered with some granular tissue at the 6 o'clock location starting to show through. This did require sharp debridement and Slough was removed along with subcutaneous tissue and this was debride it down to a fairly good wound bed over a majority of the wound  though there is some Slough still noted centrally and at the 12 o'clock location that was not able to be easily removed during debridement. Patient tolerated debridement well. Integumentary (Hair, Skin) Wound #1 status is Open. Original cause of wound was Pressure Injury. The wound is located on the Right Sacrum. The wound measures 2.7cm length x 2.4cm width x 0.9cm depth; 5.089cm^2 area and 4.58cm^3 volume. There is muscle and Fat Layer (Subcutaneous Tissue) Exposed exposed. There is no tunneling or undermining noted. There is a large amount of serous drainage noted. The wound margin is distinct with the outline attached to the wound base. There is no granulation within the wound bed. There is a large (67- 100%) amount of  necrotic tissue within the wound bed including Adherent Slough and Necrosis of Muscle. The periwound skin appearance exhibited: Induration, Dry/Scaly, Rubor, Erythema. The surrounding wound skin color is noted with erythema which is circumferential. Periwound temperature was noted as No Abnormality. The periwound has tenderness on palpation. Wound #7 status is Healed - Epithelialized. Original cause of wound was Pressure Injury. The wound is located on the Left Sacrum. The wound measures 0cm length x 0cm width x 0cm depth; 0cm^2 area and 0cm^3 volume. [name], [name] ([id number]) Assessment Active Problems ICD-10 L89.150 - Pressure ulcer of sacral region, unstageable S91.011D - Laceration without foreign body, right ankle, subsequent encounter S21.219D - Laceration without foreign body of unspecified back wall of thorax without penetration into thoracic cavity, subsequent encounter E11.622 - Type 2 diabetes mellitus with other skin ulcer Procedures Wound #1 Pre-procedure diagnosis of Wound #1 is a Pressure Ulcer located on the Right Sacrum . There was a Skin/Subcutaneous Tissue Debridement ([id number]) debridement with total area of 6.48 sq cm performed by STONE III,  HOYT E., PA-C. with the following instrument(s): Curette to remove Viable and Non-Viable tissue/material including Fibrin/Slough, Eschar, and Subcutaneous after achieving pain control using Lidocaine 4% Topical Solution. A time out was conducted at 11:43, prior to the start of the procedure. A Minimum amount of bleeding was controlled with Pressure. The procedure was tolerated well with a pain level of 0 throughout and a pain level of 0 following the procedure. Post Debridement Measurements: 2.7cm length x 2.4cm width x 1cm depth; 5.089cm^3 volume. Post debridement Stage noted as Category/Stage IV. Character of Wound/Ulcer Post Debridement is improved. Post procedure Diagnosis Wound #1: Same as Pre-Procedure Plan Wound Cleansing: Wound #1 Right Sacrum: Cleanse wound with mild soap and water Anesthetic: Wound #1 Right Sacrum: Topical Lidocaine 4% cream applied to wound bed prior to debridement Primary Wound Dressing: Wound #1 Right Sacrum: Santyl Ointment Sween, Caswell K. ([id number]) Secondary Dressing: Wound #1 Right Sacrum: Dry Gauze Saline moistened gauze Boardered Foam Dressing Dressing Change Frequency: Wound #1 Right Sacrum: Change dressing every day. Follow-up Appointments: Wound #1 Right Sacrum: Return Appointment in 1 [name]. Off-Loading: Wound #1 Right Sacrum: Turn and reposition every 2 hours General Notes: I'm going to recommend that we continue with the Santyl per the above wound care orders for the next [name]. We [name] see him for reevaluation at that point. If anything worsens in the interim patient [name] contact our office for additional recommendations. Otherwise hopefully he [name] continue to show signs of improvement [name] by [name]. If anything worsens in the interim patient [name] contact our office for additional recommendations otherwise we [name] see him for reevaluation in one [name]. Electronic Signature(s) Signed: 08/09/2016 5:26:05 PM By: Worthy Keeler  PA-C Entered By: Worthy Keeler on 08/09/2016 13:45:26 [name], [name] ([id number]) -------------------------------------------------------------------------------- SuperBill Details Patient Name: [name] Date of Service: 08/09/2016 Medical Record Number: [id number] Patient Account Number: [id number] Date of Birth/Sex: 16-Nov-1940 (75 y.o. Male) Treating RN: Montey Hora Primary Care Provider: Johny Drilling Other Clinician: Referring Provider: Johny Drilling Treating Provider/Extender: Melburn Hake, HOYT Weeks in Treatment: 6 Diagnosis Coding ICD-10 Codes Code Description L89.150 Pressure ulcer of sacral region, unstageable S91.011D Laceration without foreign body, right ankle, subsequent encounter Laceration without foreign body of unspecified back wall of thorax without penetration into S21.219D thoracic cavity, subsequent encounter E11.622 Type 2 diabetes mellitus with other skin ulcer Facility Procedures CPT4 Code: [id number] Description: 56812 - DEB SUBQ TISSUE 20 SQ CM/< ICD-10 Description Diagnosis L89.150 Pressure  ulcer of sacral region, unstageable Modifier: Quantity: 1 Physician Procedures CPT4 Code: [id number] Description: 77412 - WC PHYS SUBQ TISS 20 SQ CM ICD-10 Description Diagnosis L89.150 Pressure ulcer of sacral region, unstageable Modifier: Quantity: 1 Electronic Signature(s) Signed: 08/09/2016 5:26:05 PM By: Worthy Keeler PA-C Entered By: Worthy Keeler on 08/09/2016 13:45:36

## 2016-08-16 ENCOUNTER — Encounter: Payer: Medicare Other | Admitting: Physician Assistant

## 2016-08-16 DIAGNOSIS — E11622 Type 2 diabetes mellitus with other skin ulcer: Secondary | ICD-10-CM | POA: Diagnosis not present

## 2016-08-18 NOTE — Progress Notes (Signed)
GRAESON, NOURI ([id number]) Visit Report for 08/16/2016 Chief Complaint Document Details Patient Name: BEJAMIN, HACKBART Date of Service: 08/16/2016 2:30 PM Medical Record Number: [id number] Patient Account Number: [id number] Date of Birth/Sex: June 17, 1940 (76 y.o. Male) Treating RN: Ahmed Prima Primary Care Provider: Johny Drilling Other Clinician: Referring Provider: Johny Drilling Treating Provider/Extender: Melburn Hake, HOYT Weeks in Treatment: 7 Information Obtained from: Patient Chief Complaint 06/28/16; patient is here accompanied by his daughter for review of multiple pressure ulcerations predominantly his lower sacrum/coccyx and surrounding buttock's Electronic Signature(s) Signed: 08/17/2016 1:25:22 AM By: Worthy Keeler PA-C Entered By: Worthy Keeler on 08/16/2016 14:25:06 Bowers, [name] ([id number]) -------------------------------------------------------------------------------- Debridement Details Patient Name: [name] Date of Service: 08/16/2016 2:30 PM Medical Record Number: [id number] Patient Account Number: [id number] Date of Birth/Sex: 02-01-1940 (76 y.o. Male) Treating RN: Ahmed Prima Primary Care Provider: Johny Drilling Other Clinician: Referring Provider: Johny Drilling Treating Provider/Extender: Melburn Hake, HOYT Weeks in Treatment: 7 Debridement Performed for Wound #1 Right Sacrum Assessment: Performed By: Physician STONE III, HOYT E., PA-C Debridement: Debridement Pre-procedure Verification/Time Out Yes - 14:35 Taken: Start Time: 14:36 Pain Control: Lidocaine 4% Topical Solution Level: Skin/Subcutaneous Tissue Total Area Debrided (L x 2 (cm) x 2.8 (cm) = 5.6 (cm) W): Tissue and other Viable, Non-Viable, Exudate, Fibrin/Slough, Subcutaneous material debrided: Instrument: Curette Bleeding: Minimum Hemostasis Achieved: Pressure End Time: 14:40 Procedural Pain: 0 Post Procedural Pain: 0 Response to Treatment: Procedure was  tolerated well Post Debridement Measurements of Total Wound Length: (cm) 2 Stage: Category/Stage IV Width: (cm) 2.8 Depth: (cm) 0.9 Volume: (cm) 3.958 Character of Wound/Ulcer Post Requires Further Debridement: Debridement Post Procedure Diagnosis Same as Pre-procedure Electronic Signature(s) Signed: 08/16/2016 4:46:49 PM By: Alric Quan Signed: 08/17/2016 1:25:22 AM By: Worthy Keeler PA-C Entered By: Alric Quan on 08/16/2016 14:40:14 [place], [name] ([id number]) -------------------------------------------------------------------------------- HPI Details Patient Name: [name] Date of Service: 08/16/2016 2:30 PM Medical Record Number: [id number] Patient Account Number: [id number] Date of Birth/Sex: [birth year] (76 y.o. Male) Treating RN: Ahmed Prima Primary Care Provider: Johny Drilling Other Clinician: Referring Provider: Johny Drilling Treating Provider/Extender: Melburn Hake, HOYT Weeks in Treatment: 7 History of Present Illness HPI Description: 06/28/16; this is a 77 year old man who was admitted to hospital from 6/13 through 06/18/16 at [facility] after being found down in a closet in his home. It is not exactly clear how long he was actually there although it was probably for days. His total CK maximum was at 2582. He was admitted with delirium, possibly heat related illness, possible subendocardial MI. An MRI of the brain was negative. During this hospitalization he was noted to have areas of pressure-related injury for the time he was spent on the floor on his back he was stated to have a stage II pressure ulcer over his buttocks. Multiple excoriations were also noted. He has advanced home care about doing physical therapy there requesting a nurse which seems reasonable. The patient is a diabetic on metformin. They're using bacitracin to all these areas. He was prescribed doxycycline which I believe he is still on. 07/05/16; patient with pressure  ulcers on his right greater than left buttock surrounding his coccyx. Excoriation over the right lateral ankle an excoriation on his upper back. They did not get any Santyl or medihoney. We will take care of this today. 07/12/16 unfortunately patient's wounds appeared to be doing better across the board and the eschar that is overlying the sacral wound has loosened up to where I could debride  somewhat today. There's no evidence of 07/20/16; butterfly shaped wound across the sacrum and the surrounding skin and soft tissue. The area on the right is much larger than the left. The area on the left looks healthy on the right still and necrotic nonviable surface. Using Santyl 07/26/16; butterfly shaped wound across the sacrum and surrounding skin and soft tissue. The area on the right much larger than the left. Extensive debridement on the right last week post-debridement culture showed enterococcus faecalis. This is ampicillin sensitive and he'll definitely need Augmentin. We have been using Santyl.Kermit Balo improvement this week 08/02/16; butterfly shaped wound across the sacrum and surrounding skin and soft tissue. The area on the left is just about closed the area on the right has settled into a stage III wound initially unstageable on arrival. This will slow bleed ongoing debridement both mechanically and enzymatically although we are making good progress each week. Using Santyl/moist gauze/border foam 08/09/16 on evaluation today patient presents with a slough covered sacral wound but he tells me he experiences some mild discomfort regarding. This pain seems to be worse mainly because cleansing and he rates this to be a 2 out of 10. With that being said he does seem to be unfortunately after last week's debridement she states that he had a snack a lot of bleeding upon arrival at home making some progress with the Santyl at this point. His daughter actually does the dressing changes for him daily. Wish  they had not noted up until that point. Otherwise things have been going well in regard to treatment. Patient has no nausea, vomiting, or diarrhea as well as no fever or chills. 08/16/16 on evaluation today patient appears to be doing well in regard to his sacral ulcer. He is the dressing changes. The only concern that has been made and brought up by his daughter at this point is that the dressings are being sent from the supply company only have a 2 x 2 pad and the wound is larger than 2 x 2 in dimension. She therefore seven difficulty getting the dressing to seal appropriately without leaking or Telfair, Eligha K. ([id number]) causing other issues. Obviously this has been frustrating. Nonetheless otherwise his wounds seems to be doing well. No fevers, chills, nausea, or vomiting noted at this time. Electronic Signature(s) Signed: 08/17/2016 1:25:22 AM By: Worthy Keeler PA-C Entered By: Worthy Keeler on 08/16/2016 15:17:22 Keep, [name] ([id number]) -------------------------------------------------------------------------------- Physical Exam Details Patient Name: [name] Date of Service: 08/16/2016 2:30 PM Medical Record Number: [id number] Patient Account Number: [id number] Date of Birth/Sex: 1940/05/24 (76 y.o. Male) Treating RN: Ahmed Prima Primary Care Provider: Johny Drilling Other Clinician: Referring Provider: Johny Drilling Treating Provider/Extender: STONE III, HOYT Weeks in Treatment: 7 Constitutional Well-nourished and well-hydrated in no acute distress. Respiratory normal breathing without difficulty. Psychiatric this patient is able to make decisions and demonstrates good insight into disease process. Alert and Oriented x 3. pleasant and cooperative. Notes This wound is slough covered that revealed no evidence of erythema in the surrounding periwound. He is making some progress in regard to the wound bed there is some granular tissue starting to really  fill in around the edges and not as much [facility] noted. Nonetheless this did require sharp debridement today which he tolerated without significant pain. Electronic Signature(s) Signed: 08/17/2016 1:25:22 AM By: Worthy Keeler PA-C Entered By: Worthy Keeler on 08/16/2016 15:17:58 Degan, [name] ([id number]) -------------------------------------------------------------------------------- Physician Orders Details Patient Name: [name].  Date of Service: 08/16/2016 2:30 PM Medical Record Number: [id number] Patient Account Number: [id number] Date of Birth/Sex: 04-10-1940 (76 y.o. Male) Treating RN: Ahmed Prima Primary Care Provider: Johny Drilling Other Clinician: Referring Provider: Johny Drilling Treating Provider/Extender: Melburn Hake, HOYT Weeks in Treatment: 7 Verbal / Phone Orders: Yes Clinician: Carolyne Fiscal, Debi Read Back and Verified: Yes Diagnosis Coding ICD-10 Coding Code Description L89.150 Pressure ulcer of sacral region, unstageable S91.011D Laceration without foreign body, right ankle, subsequent encounter Laceration without foreign body of unspecified back wall of thorax without penetration into S21.219D thoracic cavity, subsequent encounter E11.622 Type 2 diabetes mellitus with other skin ulcer Wound Cleansing Wound #1 Right Sacrum o Cleanse wound with mild soap and water Anesthetic Wound #1 Right Sacrum o Topical Lidocaine 4% cream applied to wound bed prior to debridement Primary Wound Dressing Wound #1 Right Sacrum o Santyl Ointment Secondary Dressing Wound #1 Right Sacrum o Dry Gauze o Saline moistened gauze o Boardered Foam Dressing Dressing Change Frequency Wound #1 Right Sacrum o Change dressing every day. Follow-up Appointments Wound #1 Right Sacrum o Return Appointment in 1 week. DEMARRION, MEIKLEJOHN ([id number]) Off-Loading Wound #1 Right Sacrum o Turn and reposition every 2 hours Electronic Signature(s) Signed:  08/16/2016 4:46:49 PM By: Alric Quan Signed: 08/17/2016 1:25:22 AM By: Worthy Keeler PA-C Entered By: Alric Quan on 08/16/2016 14:30:30 Pauls, [name] ([id number]) -------------------------------------------------------------------------------- Problem List Details Patient Name: [name] Date of Service: 08/16/2016 2:30 PM Medical Record Number: [id number] Patient Account Number: [id number] Date of Birth/Sex: 1940-05-22 (76 y.o. Male) Treating RN: Ahmed Prima Primary Care Provider: Johny Drilling Other Clinician: Referring Provider: Johny Drilling Treating Provider/Extender: Melburn Hake, HOYT Weeks in Treatment: 7 Active Problems ICD-10 Encounter Code Description Active Date Diagnosis L89.150 Pressure ulcer of sacral region, unstageable 06/28/2016 Yes S91.011D Laceration without foreign body, right ankle, subsequent 06/28/2016 Yes encounter S21.219D Laceration without foreign body of unspecified back wall 06/28/2016 Yes of thorax without penetration into thoracic cavity, subsequent encounter E11.622 Type 2 diabetes mellitus with other skin ulcer 06/28/2016 Yes Inactive Problems Resolved Problems Electronic Signature(s) Signed: 08/17/2016 1:25:22 AM By: Worthy Keeler PA-C Entered By: Worthy Keeler on 08/16/2016 14:24:47 Mandley, [name] ([id number]) -------------------------------------------------------------------------------- Progress Note Details Patient Name: [name] Date of Service: 08/16/2016 2:30 PM Medical Record Number: [id number] Patient Account Number: [id number] Date of Birth/Sex: 02-03-1940 (76 y.o. Male) Treating RN: Ahmed Prima Primary Care Provider: Johny Drilling Other Clinician: Referring Provider: Johny Drilling Treating Provider/Extender: Melburn Hake, HOYT Weeks in Treatment: 7 Subjective Chief Complaint Information obtained from Patient 06/28/16; patient is here accompanied by his daughter for review of multiple pressure  ulcerations predominantly his lower sacrum/coccyx and surrounding buttock's History of Present Illness (HPI) 06/28/16; this is a 76 year old man who was admitted to hospital from 6/13 through 06/18/16 at [facility] after being found down in a closet in his home. It is not exactly clear how long he was actually there although it was probably for days. His total CK maximum was at 2582. He was admitted with delirium, possibly heat related illness, possible subendocardial MI. An MRI of the brain was negative. During this hospitalization he was noted to have areas of pressure-related injury for the time he was spent on the floor on his back he was stated to have a stage II pressure ulcer over his buttocks. Multiple excoriations were also noted. He has advanced home care about doing physical therapy there requesting a nurse which seems reasonable. The patient is a diabetic on metformin.  They're using bacitracin to all these areas. He was prescribed doxycycline which I believe he is still on. 07/05/16; patient with pressure ulcers on his right greater than left buttock surrounding his coccyx. Excoriation over the right lateral ankle an excoriation on his upper back. They did not get any Santyl or medihoney. We will take care of this today. 07/12/16 unfortunately patient's wounds appeared to be doing better across the board and the eschar that is overlying the sacral wound has loosened up to where I could debride somewhat today. There's no evidence of 07/20/16; butterfly shaped wound across the sacrum and the surrounding skin and soft tissue. The area on the right is much larger than the left. The area on the left looks healthy on the right still and necrotic nonviable surface. Using Santyl 07/26/16; butterfly shaped wound across the sacrum and surrounding skin and soft tissue. The area on the right much larger than the left. Extensive debridement on the right last week post-debridement culture showed  enterococcus faecalis. This is ampicillin sensitive and he'll definitely need Augmentin. We have been using Santyl.Kermit Balo improvement this week 08/02/16; butterfly shaped wound across the sacrum and surrounding skin and soft tissue. The area on the left is just about closed the area on the right has settled into a stage III wound initially unstageable on arrival. This will slow bleed ongoing debridement both mechanically and enzymatically although we are making good progress each week. Using Santyl/moist gauze/border foam 08/09/16 on evaluation today patient presents with a slough covered sacral wound but he tells me he experiences some mild discomfort regarding. This pain seems to be worse mainly because cleansing and he rates this to be a 2 out of 10. With that being said he does seem to be unfortunately after last week's debridement she states that he had a snack a lot of bleeding upon arrival at home making some progress Linsley, Johnnathan K. ([id number]) with the Santyl at this point. His daughter actually does the dressing changes for him daily. Wish they had not noted up until that point. Otherwise things have been going well in regard to treatment. Patient has no nausea, vomiting, or diarrhea as well as no fever or chills. 08/16/16 on evaluation today patient appears to be doing well in regard to his sacral ulcer. He is the dressing changes. The only concern that has been made and brought up by his daughter at this point is that the dressings are being sent from the supply company only have a 2 x 2 pad and the wound is larger than 2 x 2 in dimension. She therefore seven difficulty getting the dressing to seal appropriately without leaking or causing other issues. Obviously this has been frustrating. Nonetheless otherwise his wounds seems to be doing well. No fevers, chills, nausea, or vomiting noted at this time. Objective Constitutional Well-nourished and well-hydrated in no acute  distress. Vitals Time Taken: 2:06 PM, Height: 69 in, Weight: 147.5 lbs, BMI: 21.8, Temperature: 98.1 F, Pulse: 69 bpm, Respiratory Rate: 16 breaths/min, Blood Pressure: 131/54 mmHg. Respiratory normal breathing without difficulty. Psychiatric this patient is able to make decisions and demonstrates good insight into disease process. Alert and Oriented x 3. pleasant and cooperative. General Notes: This wound is slough covered that revealed no evidence of erythema in the surrounding periwound. He is making some progress in regard to the wound bed there is some granular tissue starting to really fill in around the edges and not as much [facility] noted. Nonetheless this did require  sharp debridement today which he tolerated without significant pain. Integumentary (Hair, Skin) Wound #1 status is Open. Original cause of wound was Pressure Injury. The wound is located on the Right Sacrum. The wound measures 2cm length x 2.8cm width x 0.9cm depth; 4.398cm^2 area and 3.958cm^3 volume. There is muscle and Fat Layer (Subcutaneous Tissue) Exposed exposed. There is no tunneling or undermining noted. There is a large amount of serous drainage noted. The wound margin is distinct with the outline attached to the wound base. There is medium (34-66%) granulation within the wound bed. There is a medium (34-66%) amount of necrotic tissue within the wound bed including Adherent Slough and Necrosis of Muscle. The periwound skin appearance exhibited: Induration, Dry/Scaly, Rubor, Erythema. The surrounding wound skin color is noted with erythema which is circumferential. Periwound temperature Laws, Naksh K. ([id number]) was noted as No Abnormality. The periwound has tenderness on palpation. Assessment Active Problems ICD-10 L89.150 - Pressure ulcer of sacral region, unstageable S91.011D - Laceration without foreign body, right ankle, subsequent encounter S21.219D - Laceration without foreign body of unspecified  back wall of thorax without penetration into thoracic cavity, subsequent encounter E11.622 - Type 2 diabetes mellitus with other skin ulcer Procedures Wound #1 Pre-procedure diagnosis of Wound #1 is a Pressure Ulcer located on the Right Sacrum . There was a Skin/Subcutaneous Tissue Debridement ([id number]) debridement with total area of 5.6 sq cm performed by STONE III, HOYT E., PA-C. with the following instrument(s): Curette to remove Viable and Non-Viable tissue/material including Exudate, Fibrin/Slough, and Subcutaneous after achieving pain control using Lidocaine 4% Topical Solution. A time out was conducted at 14:35, prior to the start of the procedure. A Minimum amount of bleeding was controlled with Pressure. The procedure was tolerated well with a pain level of 0 throughout and a pain level of 0 following the procedure. Post Debridement Measurements: 2cm length x 2.8cm width x 0.9cm depth; 3.958cm^3 volume. Post debridement Stage noted as Category/Stage IV. Character of Wound/Ulcer Post Debridement requires further debridement. Post procedure Diagnosis Wound #1: Same as Pre-Procedure Plan Wound Cleansing: Wound #1 Right Sacrum: Cleanse wound with mild soap and water Anesthetic: Wound #1 Right Sacrum: Topical Lidocaine 4% cream applied to wound bed prior to debridement Dejaynes, Aki K. ([id number]) Primary Wound Dressing: Wound #1 Right Sacrum: Santyl Ointment Secondary Dressing: Wound #1 Right Sacrum: Dry Gauze Saline moistened gauze Boardered Foam Dressing Dressing Change Frequency: Wound #1 Right Sacrum: Change dressing every day. Follow-up Appointments: Wound #1 Right Sacrum: Return Appointment in 1 week. Off-Loading: Wound #1 Right Sacrum: Turn and reposition every 2 hours I'm going to recommend that we continue with the Current wound care measures per above. We did make a note that he does need the larger 4 x 4 size Allevyn for his sacral wound due to the wound  dimensions. We will see were things stand in one weeks time with reevaluation. Anything worsens in the interim patient will contact our office for additional recommendations. Otherwise please see above for specific wound care orders. Electronic Signature(s) Signed: 08/17/2016 1:25:22 AM By: Worthy Keeler PA-C Entered By: Worthy Keeler on 08/16/2016 15:18:40 Goelz, [name] ([id number]) -------------------------------------------------------------------------------- SuperBill Details Patient Name: [name] Date of Service: 08/16/2016 Medical Record Number: [id number] Patient Account Number: [id number] Date of Birth/Sex: November 06, 1940 (76 y.o. Male) Treating RN: Ahmed Prima Primary Care Provider: Johny Drilling Other Clinician: Referring Provider: Johny Drilling Treating Provider/Extender: Melburn Hake, HOYT Weeks in Treatment: 7 Diagnosis Coding ICD-10 Codes Code Description L89.150 Pressure ulcer of  sacral region, unstageable S91.011D Laceration without foreign body, right ankle, subsequent encounter Laceration without foreign body of unspecified back wall of thorax without penetration into S21.219D thoracic cavity, subsequent encounter E11.622 Type 2 diabetes mellitus with other skin ulcer Facility Procedures CPT4 Code: [id number] Description: 03754 - DEB SUBQ TISSUE 20 SQ CM/< ICD-10 Description Diagnosis L89.150 Pressure ulcer of sacral region, unstageable Modifier: Quantity: 1 Physician Procedures CPT4 Code: 3606770 Description: 34035 - WC PHYS SUBQ TISS 20 SQ CM ICD-10 Description Diagnosis L89.150 Pressure ulcer of sacral region, unstageable Modifier: Quantity: 1 Electronic Signature(s) Signed: 08/17/2016 1:25:22 AM By: Worthy Keeler PA-C Entered By: Worthy Keeler on 08/16/2016 15:18:50

## 2016-08-19 NOTE — Progress Notes (Signed)
[name] ([id number]) Visit Report for 08/16/2016 Arrival Information Details Patient Name: [name], [name] Date of Service: 08/16/2016 2:30 PM Medical Record Number: [id number] Patient Account Number: [id number] Date of Birth/Sex: [birth year] (76 y.o. Male) Treating RN: Ahmed Prima Primary Care Yamaira Spinner: Johny Drilling Other Clinician: Referring Clementina Mareno: Johny Drilling Treating Aerabella Galasso/Extender: Melburn Hake, HOYT Weeks in Treatment: 7 Visit Information History Since Last Visit All ordered tests and consults were completed: No Patient Arrived: [name] Added or deleted any medications: No Arrival Time: 14:06 Any new allergies or adverse reactions: No Accompanied By: daughter Had a fall or experienced change in No Transfer Assistance: EasyPivot activities of daily living that may affect Patient Lift risk of falls: Patient Identification Verified: Yes Signs or symptoms of abuse/neglect since last No Secondary Verification Process Yes visito Completed: Hospitalized since last visit: No Patient Requires Transmission- No Has Dressing in Place as Prescribed: Yes Based Precautions: Pain Present Now: No Patient Has Alerts: Yes Patient Alerts: DM II Electronic Signature(s) Signed: 08/16/2016 4:46:49 PM By: Alric Quan Entered By: Alric Quan on 08/16/2016 14:06:33 [place], [name] ([id number]) -------------------------------------------------------------------------------- Encounter Discharge Information Details Patient Name: [name] Date of Service: 08/16/2016 2:30 PM Medical Record Number: [id number] Patient Account Number: [id number] Date of Birth/Sex: 02-Mar-1940 (76 y.o. Male) Treating RN: Ahmed Prima Primary Care Kynzli Rease: Johny Drilling Other Clinician: Referring Tyrene Nader: Johny Drilling Treating Nivedita Mirabella/Extender: Melburn Hake, HOYT Weeks in Treatment: 7 Encounter Discharge Information Items Discharge Pain Level: 0 Discharge Condition:  Stable Ambulatory Status: Cane Discharge Destination: Home Transportation: Private Auto Accompanied By: daughters Schedule Follow-up Appointment: Yes Medication Reconciliation completed No and provided to Patient/Care Angeliyah Kirkey: Provided on Clinical Summary of Care: 08/16/2016 Form Type Recipient Paper Patient LM Electronic Signature(s) Signed: 08/18/2016 10:14:57 AM By: Ruthine Dose Entered By: Ruthine Dose on 08/16/2016 14:54:32 [place], [name] ([id number]) -------------------------------------------------------------------------------- Lower Extremity Assessment Details Patient Name: [name] Date of Service: 08/16/2016 2:30 PM Medical Record Number: [id number] Patient Account Number: [id number] Date of Birth/Sex: 1940-06-11 (76 y.o. Male) Treating RN: Ahmed Prima Primary Care Hailynn Slovacek: Johny Drilling Other Clinician: Referring Emilly Lavey: Johny Drilling Treating Zhion Pevehouse/Extender: Melburn Hake, HOYT Weeks in Treatment: 7 Electronic Signature(s) Signed: 08/16/2016 4:46:49 PM By: Alric Quan Entered By: Alric Quan on 08/16/2016 14:20:14 [place], [name] ([id number]) -------------------------------------------------------------------------------- Multi Wound Chart Details Patient Name: [name] Date of Service: 08/16/2016 2:30 PM Medical Record Number: [id number] Patient Account Number: [id number] Date of Birth/Sex: [birth year] (76 y.o. Male) Treating RN: Ahmed Prima Primary Care Diyari Cherne: Johny Drilling Other Clinician: Referring Sem Mccaughey: Johny Drilling Treating Raynesha Tiedt/Extender: STONE III, HOYT Weeks in Treatment: 7 Vital Signs Height(in): 69 Pulse(bpm): 69 Weight(lbs): 147.5 Blood Pressure 131/54 (mmHg): Body Mass Index(BMI): 22 Temperature(F): 98.1 Respiratory Rate 16 (breaths/min): Photos: [1:No Photos] [N/A:N/A] Wound Location: [1:Right Sacrum] [N/A:N/A] Wounding Event: [1:Pressure Injury] [N/A:N/A] Primary Etiology:  [1:Pressure Ulcer] [N/A:N/A] Date Acquired: [1:06/14/2016] [N/A:N/A] Weeks of Treatment: [1:7] [N/A:N/A] Wound Status: [1:Open] [N/A:N/A] Measurements L x W x D 2x2.8x0.9 [N/A:N/A] (cm) Area (cm) : [1:4.398] [N/A:N/A] Volume (cm) : [1:3.958] [N/A:N/A] % Reduction in Area: [1:88.40%] [N/A:N/A] % Reduction in Volume: 47.90% [N/A:N/A] Classification: [1:Category/Stage IV] [N/A:N/A] Exudate Amount: [1:Large] [N/A:N/A] Exudate Type: [1:Serous] [N/A:N/A] Exudate Color: [1:amber] [N/A:N/A] Wound Margin: [1:Distinct, outline attached] [N/A:N/A] Granulation Amount: [1:Medium (34-66%)] [N/A:N/A] Necrotic Amount: [1:Medium (34-66%)] [N/A:N/A] Exposed Structures: [1:Fat Layer (Subcutaneous Tissue) Exposed: Yes Muscle: Yes Fascia: No Tendon: No Joint: No Bone: No] [N/A:N/A] Epithelialization: [1:Small (1-33%)] [N/A:N/A] Periwound Skin Texture: Induration: Yes [1:Dry/Scaly: Yes] [N/A:N/A N/A] Periwound Skin Moisture: Periwound Skin Color: Erythema:  Yes N/A N/A Rubor: Yes Erythema Location: Circumferential N/A N/A Temperature: No Abnormality N/A N/A Tenderness on Yes N/A N/A Palpation: Wound Preparation: Ulcer Cleansing: N/A N/A Rinsed/Irrigated with Saline Topical Anesthetic Applied: Other: lidocaine 4% Treatment Notes Electronic Signature(s) Signed: 08/16/2016 4:46:49 PM By: Alric Quan Entered By: Alric Quan on 08/16/2016 14:20:33 [place], [name] ([id number]) -------------------------------------------------------------------------------- Multi-Disciplinary Care Plan Details Patient Name: [name] Date of Service: 08/16/2016 2:30 PM Medical Record Number: [id number] Patient Account Number: [id number] Date of Birth/Sex: 09/20/1940 (76 y.o. Male) Treating RN: Ahmed Prima Primary Care Addisyn Leclaire: Johny Drilling Other Clinician: Referring Timarie Labell: Johny Drilling Treating Deaaron Fulghum/Extender: Melburn Hake, HOYT Weeks in Treatment: 7 Active Inactive ` Abuse /  Safety / Falls / Self Care Management Nursing Diagnoses: Potential for falls Goals: Patient will not experience any injury related to falls Date Initiated: 06/28/2016 Target Resolution Date: 10/08/2016 Goal Status: Active Interventions: Assess Activities of Daily Living upon admission and as needed Assess: immobility, friction, shearing, incontinence upon admission and as needed Assess impairment of mobility on admission and as needed per policy Notes: ` Nutrition Nursing Diagnoses: Imbalanced nutrition Impaired glucose control: actual or potential Potential for alteratiion in Nutrition/Potential for imbalanced nutrition Goals: Patient/caregiver agrees to and verbalizes understanding of need to use nutritional supplements and/or vitamins as prescribed Date Initiated: 06/28/2016 Target Resolution Date: 09/10/2016 Goal Status: Active Patient/caregiver verbalizes understanding of need to maintain therapeutic glucose control per primary care physician Date Initiated: 06/28/2016 Target Resolution Date: 09/10/2016 Goal Status: Active Interventions: MCADAMSSteve, [place] ([id number]) Assess patient nutrition upon admission and as needed per policy Notes: ` Orientation to the Wound Care Program Nursing Diagnoses: Knowledge deficit related to the wound healing center program Goals: Patient/caregiver will verbalize understanding of the [place] Program Date Initiated: 06/28/2016 Target Resolution Date: 07/09/2016 Goal Status: Active Interventions: Provide education on orientation to the wound center Notes: ` Pain, Acute or Chronic Nursing Diagnoses: Pain, acute or chronic: actual or potential Potential alteration in comfort, pain Goals: Patient/caregiver will verbalize adequate pain control between visits Date Initiated: 06/28/2016 Target Resolution Date: 10/08/2016 Goal Status: Active Interventions: Assess comfort goal upon admission Complete pain assessment as per visit  requirements Notes: ` Pressure Nursing Diagnoses: Knowledge deficit related to causes and risk factors for pressure ulcer development Knowledge deficit related to management of pressures ulcers Potential for impaired tissue integrity related to pressure, friction, moisture, and shear Goals: Patient will remain free from development of additional pressure ulcers [place], SHEPARD KELTZ ([id number]) Date Initiated: 06/28/2016 Target Resolution Date: 10/08/2016 Goal Status: Active Interventions: Assess: immobility, friction, shearing, incontinence upon admission and as needed Assess offloading mechanisms upon admission and as needed Notes: ` Wound/Skin Impairment Nursing Diagnoses: Impaired tissue integrity Knowledge deficit related to ulceration/compromised skin integrity Goals: Ulcer/skin breakdown will have a volume reduction of 80% by week 12 Date Initiated: 06/28/2016 Target Resolution Date: 10/01/2016 Goal Status: Active Interventions: Assess patient/caregiver ability to perform ulcer/skin care regimen upon admission and as needed Assess ulceration(s) every visit Notes: Electronic Signature(s) Signed: 08/16/2016 4:46:49 PM By: Alric Quan Entered By: Alric Quan on 08/16/2016 14:20:21 [place], [name] ([id number]) -------------------------------------------------------------------------------- Pain Assessment Details Patient Name: [name] Date of Service: 08/16/2016 2:30 PM Medical Record Number: [id number] Patient Account Number: [id number] Date of Birth/Sex: 01-Apr-1940 (76 y.o. Male) Treating RN: Ahmed Prima Primary Care Deasiah Hagberg: Johny Drilling Other Clinician: Referring Karra Pink: Johny Drilling Treating Johnell Landowski/Extender: Melburn Hake, HOYT Weeks in Treatment: 7 Active Problems Location of Pain Severity and Description of Pain Patient Has Paino No Site  Locations With Dressing Change: No Pain Management and Medication Current Pain  Management: Electronic Signature(s) Signed: 08/16/2016 4:46:49 PM By: Alric Quan Entered By: Alric Quan on 08/16/2016 14:06:39 [place], [name] ([id number]) -------------------------------------------------------------------------------- Patient/Caregiver Education Details Patient Name: [name] Date of Service: 08/16/2016 2:30 PM Medical Record Number: [id number] Patient Account Number: [id number] Date of Birth/Gender: [birth year] (76 y.o. Male) Treating RN: Ahmed Prima Primary Care Physician: Johny Drilling Other Clinician: Referring Physician: Johny Drilling Treating Physician/Extender: Sharalyn Ink in Treatment: 7 Education Assessment Education Provided To: Patient Education Topics Provided Wound/Skin Impairment: Handouts: Other: change dressing as ordered Methods: Demonstration, Explain/Verbal Responses: State content correctly Electronic Signature(s) Signed: 08/16/2016 4:46:49 PM By: Alric Quan Entered By: Alric Quan on 08/16/2016 14:50:04 [place], [name] ([id number]) -------------------------------------------------------------------------------- Wound Assessment Details Patient Name: [name] Date of Service: 08/16/2016 2:30 PM Medical Record Number: [id number] Patient Account Number: [id number] Date of Birth/Sex: March 11, 1940 (76 y.o. Male) Treating RN: Ahmed Prima Primary Care [place]: Johny Drilling Other Clinician: Referring Diana Armijo: Johny Drilling Treating Keeana Pieratt/Extender: Melburn Hake, HOYT Weeks in Treatment: 7 Wound Status Wound Number: 1 Primary Etiology: Pressure Ulcer Wound Location: Right Sacrum Wound Status: Open Wounding Event: Pressure Injury Date Acquired: 06/14/2016 Weeks Of Treatment: 7 Clustered Wound: No Photos Photo Uploaded By: Alric Quan on 08/16/2016 16:14:13 Wound Measurements Length: (cm) 2 Width: (cm) 2.8 Depth: (cm) 0.9 Area: (cm) 4.398 Volume: (cm) 3.958 % Reduction  in Area: 88.4% % Reduction in Volume: 47.9% Epithelialization: Small (1-33%) Tunneling: No Undermining: No Wound Description Classification: Category/Stage IV Foul Odor Afte Wound Margin: Distinct, outline attached Slough/Fibrino Exudate Amount: Large Exudate Type: Serous Exudate Color: amber r Cleansing: No Yes Wound Bed Granulation Amount: Medium (34-66%) Exposed Structure Necrotic Amount: Medium (34-66%) Fascia Exposed: No Necrotic Quality: Adherent Slough Fat Layer (Subcutaneous Tissue) Exposed: Yes Tendon Exposed: No Maenza, Remigio K. ([id number]) Muscle Exposed: Yes Necrosis of Muscle: Yes Joint Exposed: No Bone Exposed: No Periwound Skin Texture Texture Color No Abnormalities Noted: No No Abnormalities Noted: No Induration: Yes Erythema: Yes Erythema Location: Circumferential Moisture Rubor: Yes No Abnormalities Noted: No Dry / Scaly: Yes Temperature / Pain Temperature: No Abnormality Tenderness on Palpation: Yes Wound Preparation Ulcer Cleansing: Rinsed/Irrigated with Saline Topical Anesthetic Applied: Other: lidocaine 4%, Treatment Notes Wound #1 (Right Sacrum) 1. Cleansed with: Clean wound with Normal Saline 2. Anesthetic Topical Lidocaine 4% cream to wound bed prior to debridement 3. Peri-wound Care: Skin Prep 4. Dressing Applied: Santyl Ointment Saline moistened guaze 5. Secondary Dressing Applied Bordered Foam Dressing Dry Gauze Electronic Signature(s) Signed: 08/16/2016 4:46:49 PM By: Alric Quan Entered By: Alric Quan on 08/16/2016 14:19:59 [place], [name] ([id number]) -------------------------------------------------------------------------------- [place] Details Patient Name: [name] Date of Service: 08/16/2016 2:30 PM Medical Record Number: [id number] Patient Account Number: [id number] Date of Birth/Sex: 1940-07-23 (76 y.o. Male) Treating RN: Ahmed Prima Primary Care Icie Kuznicki: Johny Drilling Other  Clinician: Referring Naleigha Raimondi: Johny Drilling Treating Garth Diffley/Extender: Melburn Hake, HOYT Weeks in Treatment: 7 Vital Signs Time Taken: 14:06 Temperature (F): 98.1 Height (in): 69 Pulse (bpm): 69 Weight (lbs): 147.5 Respiratory Rate (breaths/min): 16 Body Mass Index (BMI): 21.8 Blood Pressure (mmHg): 131/54 Reference Range: 80 - 120 mg / dl Electronic Signature(s) Signed: 08/16/2016 4:46:49 PM By: Alric Quan Entered By: Alric Quan on 08/16/2016 14:08:33

## 2016-08-23 ENCOUNTER — Encounter: Payer: Medicare Other | Admitting: Internal Medicine

## 2016-08-23 DIAGNOSIS — E11622 Type 2 diabetes mellitus with other skin ulcer: Secondary | ICD-10-CM | POA: Diagnosis not present

## 2016-08-24 NOTE — Progress Notes (Signed)
KILE, KABLER ([id number]) Visit Report for 08/23/2016 HPI Details Patient Name: [name], [name] Date of Service: 08/23/2016 12:30 PM Medical Record Patient Account Number: [id number] Number: Treating RN: Montey Hora 11-Nov-1940 (76 y.o. Other Clinician: Date of Birth/Sex: Male) Treating Erendira Crabtree Primary Care Provider: Johny Drilling Provider/Extender: G Referring Provider: Nettie Elm in Treatment: 8 History of Present Illness HPI Description: 06/28/16; this is a 76 year old man who was admitted to hospital from 6/13 through 06/18/16 at [facility] after being found down in a closet in his home. It is not exactly clear how long he was actually there although it was probably for days. His total CK maximum was at 2582. He was admitted with delirium, possibly heat related illness, possible subendocardial MI. An MRI of the brain was negative. During this hospitalization he was noted to have areas of pressure-related injury for the time he was spent on the floor on his back he was stated to have a stage II pressure ulcer over his buttocks. Multiple excoriations were also noted. He has advanced home care about doing physical therapy there requesting a nurse which seems reasonable. The patient is a diabetic on metformin. They're using bacitracin to all these areas. He was prescribed doxycycline which I believe he is still on. 07/05/16; patient with pressure ulcers on his right greater than left buttock surrounding his coccyx. Excoriation over the right lateral ankle an excoriation on his upper back. They did not get any Santyl or medihoney. We [name] take care of this today. 07/12/16 unfortunately patient's wounds appeared to be doing better across the board and the eschar that is overlying the sacral wound has loosened up to where I could debride somewhat today. There's no evidence of 07/20/16; butterfly shaped wound across the sacrum and the surrounding skin and  soft tissue. The area on the right is much larger than the left. The area on the left looks healthy on the right still and necrotic nonviable surface. Using Santyl 07/26/16; butterfly shaped wound across the sacrum and surrounding skin and soft tissue. The area on the right much larger than the left. Extensive debridement on the right last week post-debridement culture showed enterococcus faecalis. This is ampicillin sensitive and he'll definitely need Augmentin. We have been using Santyl.[name] improvement this week 08/02/16; butterfly shaped wound across the sacrum and surrounding skin and soft tissue. The area on the left is just about closed the area on the right has settled into a stage III wound initially unstageable on arrival. This [name] slow bleed ongoing debridement both mechanically and enzymatically although we are making good progress each week. Using Santyl/moist gauze/border foam 08/09/16 on evaluation today patient presents with a slough covered sacral wound but he tells me he experiences some mild discomfort regarding. This pain seems to be worse mainly because cleansing and he rates this to be a 2 out of 10. With that being said he does seem to be unfortunately after last week's debridement she states that he had a snack a lot of bleeding upon arrival at home making some progress with the Santyl at this point. His daughter actually does the dressing changes for him daily. Wish they had not noted up until that point. Otherwise things have been going well in regard to treatment. Patient has no [name], Charles K. ([id number]) nausea, vomiting, or diarrhea as well as no fever or chills. 08/16/16 on evaluation today patient appears to be doing well in regard to his sacral ulcer. He is the dressing  changes. The only concern that has been made and brought up by his daughter at this point is that the dressings are being sent from the supply company only have a 2 x 2 pad and the wound is larger  than 2 x 2 in dimension. She therefore seven difficulty getting the dressing to seal appropriately without leaking or causing other issues. Obviously this has been frustrating. Nonetheless otherwise his wounds seems to be doing well. No fevers, chills, nausea, or vomiting noted at this time. 08/23/16; the patient continues with Santyl as the primary dressing and in the 3 weeks since I have seen this he has made remarkable improvement. His daughter is changing the dressing. Electronic Signature(s) Signed: 08/23/2016 5:34:31 PM By: Linton Ham MD Entered By: Linton Ham on 08/23/2016 13:07:03 [name], [name] ([id number]) -------------------------------------------------------------------------------- Physical Exam Details Patient Name: [name] Date of Service: 08/23/2016 12:30 PM Medical Record Patient Account Number: [id number] Number: Treating RN: Montey Hora 02-Sep-1940 (76 y.o. Other Clinician: Date of Birth/Sex: Male) Treating Lafaye Mcelmurry Primary Care Provider: Johny Drilling Provider/Extender: G Referring Provider: Nettie Elm in Treatment: 8 Constitutional Sitting or standing Blood Pressure is within target range for patient.. Pulse regular and within target range for patient.Marland Kitchen Respirations regular, non-labored and within target range.. Temperature is normal and within the target range for the patient.Marland Kitchen appears in no distress. Notes Wound exam; the original butterfly shaped wound has healed on the left side of his sacrum. The area on the right which was a deep stage III wound at the end of an extensive number of mechanical debridements as a healthy granulated base. No debridement is required today. There is no evidence of surrounding infection no erythema and no crepitus Electronic Signature(s) Signed: 08/23/2016 5:34:31 PM By: Linton Ham MD Entered By: Linton Ham on 08/23/2016 13:08:14 [name], [name]  ([id number]) -------------------------------------------------------------------------------- Physician Orders Details Patient Name: [name] Date of Service: 08/23/2016 12:30 PM Medical Record Patient Account Number: [id number] Number: Treating RN: Montey Hora 08/09/1940 (75 y.o. Other Clinician: Date of Birth/Sex: Male) Treating Ethel Veronica Primary Care Provider: Johny Drilling Provider/Extender: G Referring Provider: Nettie Elm in Treatment: 8 Verbal / Phone Orders: No Diagnosis Coding Wound Cleansing Wound #1 Right Sacrum o Cleanse wound with mild soap and water Anesthetic Wound #1 Right Sacrum o Topical Lidocaine 4% cream applied to wound bed prior to debridement Primary Wound Dressing Wound #1 Right Sacrum o Santyl Ointment Secondary Dressing Wound #1 Right Sacrum o Dry Gauze o Saline moistened gauze o Boardered Foam Dressing Dressing Change Frequency Wound #1 Right Sacrum o Change dressing every day. Follow-up Appointments Wound #1 Right Sacrum o Return Appointment in 1 week. Off-Loading Wound #1 Right Sacrum o Turn and reposition every 2 hours Electronic Signature(s) Signed: 08/23/2016 5:04:14 PM By: Mickel Duhamel ([id number]) Signed: 08/23/2016 5:34:31 PM By: Linton Ham MD Entered By: Montey Hora on 08/23/2016 12:52:39 [name], [name] ([id number]) -------------------------------------------------------------------------------- Problem List Details Patient Name: [name] Date of Service: 08/23/2016 12:30 PM Medical Record Patient Account Number: [id number] Number: Treating RN: Montey Hora 16-Jul-1940 (75 y.o. Other Clinician: Date of Birth/Sex: Male) Treating Donnamae Muilenburg Primary Care Provider: Johny Drilling Provider/Extender: G Referring Provider: Nettie Elm in Treatment: 8 Active Problems ICD-10 Encounter Code Description Active  Date Diagnosis L89.150 Pressure ulcer of sacral region, unstageable 06/28/2016 Yes S91.011D Laceration without foreign body, right ankle, subsequent 06/28/2016 Yes encounter S21.219D Laceration without foreign body of unspecified back wall 06/28/2016 Yes  of thorax without penetration into thoracic cavity, subsequent encounter E11.622 Type 2 diabetes mellitus with other skin ulcer 06/28/2016 Yes Inactive Problems Resolved Problems Electronic Signature(s) Signed: 08/23/2016 5:34:31 PM By: Linton Ham MD Entered By: Linton Ham on 08/23/2016 13:05:51 [name], [name] ([id number]) -------------------------------------------------------------------------------- Progress Note Details Patient Name: [name] Date of Service: 08/23/2016 12:30 PM Medical Record Patient Account Number: [id number] Number: Treating RN: Montey Hora November 02, 1940 (75 y.o. Other Clinician: Date of Birth/Sex: Male) Treating Tisheena Maguire Primary Care Provider: Johny Drilling Provider/Extender: G Referring Provider: Nettie Elm in Treatment: 8 Subjective History of Present Illness (HPI) 06/28/16; this is a 76 year old man who was admitted to hospital from 6/13 through 06/18/16 at [facility] after being found down in a closet in his home. It is not exactly clear how long he was actually there although it was probably for days. His total CK maximum was at 2582. He was admitted with delirium, possibly heat related illness, possible subendocardial MI. An MRI of the brain was negative. During this hospitalization he was noted to have areas of pressure-related injury for the time he was spent on the floor on his back he was stated to have a stage II pressure ulcer over his buttocks. Multiple excoriations were also noted. He has advanced home care about doing physical therapy there requesting a nurse which seems reasonable. The patient is a diabetic on metformin. They're using  bacitracin to all these areas. He was prescribed doxycycline which I believe he is still on. 07/05/16; patient with pressure ulcers on his right greater than left buttock surrounding his coccyx. Excoriation over the right lateral ankle an excoriation on his upper back. They did not get any Santyl or medihoney. We [name] take care of this today. 07/12/16 unfortunately patient's wounds appeared to be doing better across the board and the eschar that is overlying the sacral wound has loosened up to where I could debride somewhat today. There's no evidence of 07/20/16; butterfly shaped wound across the sacrum and the surrounding skin and soft tissue. The area on the right is much larger than the left. The area on the left looks healthy on the right still and necrotic nonviable surface. Using Santyl 07/26/16; butterfly shaped wound across the sacrum and surrounding skin and soft tissue. The area on the right much larger than the left. Extensive debridement on the right last week post-debridement culture showed enterococcus faecalis. This is ampicillin sensitive and he'll definitely need Augmentin. We have been using Santyl.[name] improvement this week 08/02/16; butterfly shaped wound across the sacrum and surrounding skin and soft tissue. The area on the left is just about closed the area on the right has settled into a stage III wound initially unstageable on arrival. This [name] slow bleed ongoing debridement both mechanically and enzymatically although we are making good progress each week. Using Santyl/moist gauze/border foam 08/09/16 on evaluation today patient presents with a slough covered sacral wound but he tells me he experiences some mild discomfort regarding. This pain seems to be worse mainly because cleansing and he rates this to be a 2 out of 10. With that being said he does seem to be unfortunately after last week's debridement she states that he had a snack a lot of bleeding upon arrival at  home making some progress with the Santyl at this point. His daughter actually does the dressing changes for him daily. Wish they had not noted up until that point. Otherwise things have been going well in regard  to treatment. Patient has no nausea, vomiting, or diarrhea as well as no fever or chills. 08/16/16 on evaluation today patient appears to be doing well in regard to his sacral ulcer. He is the dressing [name], Charles K. ([id number]) changes. The only concern that has been made and brought up by his daughter at this point is that the dressings are being sent from the supply company only have a 2 x 2 pad and the wound is larger than 2 x 2 in dimension. She therefore seven difficulty getting the dressing to seal appropriately without leaking or causing other issues. Obviously this has been frustrating. Nonetheless otherwise his wounds seems to be doing well. No fevers, chills, nausea, or vomiting noted at this time. 08/23/16; the patient continues with Santyl as the primary dressing and in the 3 weeks since I have seen this he has made remarkable improvement. His daughter is changing the dressing. Objective Constitutional Sitting or standing Blood Pressure is within target range for patient.. Pulse regular and within target range for patient.Marland Kitchen Respirations regular, non-labored and within target range.. Temperature is normal and within the target range for the patient.Marland Kitchen appears in no distress. Vitals Time Taken: 12:42 PM, Height: 69 in, Weight: 147.5 lbs, BMI: 21.8, Temperature: 97.8 F, Pulse: 55 bpm, Respiratory Rate: 16 breaths/min, Blood Pressure: 100/45 mmHg. General Notes: Wound exam; the original butterfly shaped wound has healed on the left side of his sacrum. The area on the right which was a deep stage III wound at the end of an extensive number of mechanical debridements as a healthy granulated base. No debridement is required today. There is no evidence of surrounding infection  no erythema and no crepitus Integumentary (Hair, Skin) Wound #1 status is Open. Original cause of wound was Pressure Injury. The wound is located on the Right Sacrum. The wound measures 1.9cm length x 2.1cm width x 0.8cm depth; 3.134cm^2 area and 2.507cm^3 volume. There is muscle and Fat Layer (Subcutaneous Tissue) Exposed exposed. There is no tunneling or undermining noted. There is a large amount of serous drainage noted. The wound margin is distinct with the outline attached to the wound base. There is large (67-100%) red granulation within the wound bed. There is a small (1-33%) amount of necrotic tissue within the wound bed including Adherent Slough and Necrosis of Muscle. The periwound skin appearance exhibited: Induration, Dry/Scaly, Rubor, Erythema. The surrounding wound skin color is noted with erythema which is circumferential. Periwound temperature was noted as No Abnormality. The periwound has tenderness on palpation. Assessment [name], [name] ([id number]) Active Problems ICD-10 L89.150 - Pressure ulcer of sacral region, unstageable S91.011D - Laceration without foreign body, right ankle, subsequent encounter S21.219D - Laceration without foreign body of unspecified back wall of thorax without penetration into thoracic cavity, subsequent encounter E11.622 - Type 2 diabetes mellitus with other skin ulcer Plan Wound Cleansing: Wound #1 Right Sacrum: Cleanse wound with mild soap and water Anesthetic: Wound #1 Right Sacrum: Topical Lidocaine 4% cream applied to wound bed prior to debridement Primary Wound Dressing: Wound #1 Right Sacrum: Santyl Ointment Secondary Dressing: Wound #1 Right Sacrum: Dry Gauze Saline moistened gauze Boardered Foam Dressing Dressing Change Frequency: Wound #1 Right Sacrum: Change dressing every day. Follow-up Appointments: Wound #1 Right Sacrum: Return Appointment in 1 week. Off-Loading: Wound #1 Right Sacrum: Turn and reposition every  2 hours #1 although the wound bed looks really quite healthy and satisfactory, I decided to continue with the Santyl as he appears to be making such good  progress. The option was silver collagen although I elected to continue with the Santyl for another week. Truly remarkable improvement since I last saw this [name], [name] ([id number]) #2 I had also thought that this wound would require a wound VAC however the improvement has made this superfluous Electronic Signature(s) Signed: 08/23/2016 5:34:31 PM By: Linton Ham MD Entered By: Linton Ham on 08/23/2016 13:11:12 [name], [name] ([id number]) -------------------------------------------------------------------------------- SuperBill Details Patient Name: [name] Date of Service: 08/23/2016 Medical Record Patient Account Number: [id number] Number: Treating RN: Montey Hora [birth year] (75 y.o. Other Clinician: Date of Birth/Sex: Male) Treating Aastha Dayley Primary Care Provider: Johny Drilling Provider/Extender: G Referring Provider: Nettie Elm in Treatment: 8 Diagnosis Coding ICD-10 Codes Code Description L89.150 Pressure ulcer of sacral region, unstageable S91.011D Laceration without foreign body, right ankle, subsequent encounter Laceration without foreign body of unspecified back wall of thorax without penetration into S21.219D thoracic cavity, subsequent encounter E11.622 Type 2 diabetes mellitus with other skin ulcer Facility Procedures CPT4 Code: [id number] Description: 99213 - WOUND CARE VISIT-LEV 3 EST PT Modifier: Quantity: 1 Physician Procedures CPT4 Code: [id number] Description: 08022 - WC PHYS LEVEL 2 - EST PT ICD-10 Description Diagnosis L89.150 Pressure ulcer of sacral region, unstageable Modifier: Quantity: 1 Electronic Signature(s) Signed: 08/23/2016 1:12:35 PM By: Montey Hora Signed: 08/23/2016 5:34:31 PM By: Linton Ham MD Entered By: Montey Hora on  08/23/2016 13:12:34

## 2016-08-25 NOTE — Progress Notes (Signed)
[name] ([id number]) Visit Report for 08/23/2016 Arrival Information Details Patient Name: [name] Date of Service: 08/23/2016 12:30 PM Medical Record Number: [id number] Patient Account Number: [id number] Date of Birth/Sex: December 17, 1940 (75 y.o. Male) Treating RN: [name] Primary Care Daneille Desilva: [name] Other Clinician: Referring [name]: [name] Treating [name]/Extender: [name] in Treatment: 8 Visit Information History Since Last Visit Added or deleted any medications: No Patient Arrived: Ambulatory Any new allergies or adverse reactions: No Arrival Time: 12:41 Had a fall or experienced change in No Accompanied By: self activities of daily living that may affect Transfer Assistance: None risk of falls: Patient Identification Verified: Yes Signs or symptoms of abuse/neglect since last No Secondary Verification Process Yes visito Completed: Hospitalized since last visit: No Patient Requires Transmission-Based No Has Dressing in Place as Prescribed: Yes Precautions: Pain Present Now: No Patient Has Alerts: Yes Patient Alerts: DM II Electronic Signature(s) Signed: 08/23/2016 5:04:14 PM By: [name] Entered By: [name] on 08/23/2016 12:42:10 [name], [name] ([id number]) -------------------------------------------------------------------------------- Clinic Level of Care Assessment Details Patient Name: [name] Date of Service: 08/23/2016 12:30 PM Medical Record Number: [id number] Patient Account Number: [id number] Date of Birth/Sex: [birth year] (75 y.o. Male) Treating RN: [name] Primary Care [name]: [name] Other Clinician: Referring [name]: [name] Treating [name]/Extender: [name] in Treatment: 8 Clinic Level of Care Assessment Items TOOL 4 Quantity Score []  - Use when only an EandM is performed on FOLLOW-UP visit 0 ASSESSMENTS - Nursing Assessment /  Reassessment X - Reassessment of Co-morbidities (includes updates in patient status) 1 10 X - Reassessment of Adherence to Treatment Plan 1 5 ASSESSMENTS - Wound and Skin Assessment / Reassessment X - Simple Wound Assessment / Reassessment - one wound 1 5 []  - Complex Wound Assessment / Reassessment - multiple wounds 0 []  - Dermatologic / Skin Assessment (not related to wound area) 0 ASSESSMENTS - Focused Assessment []  - Circumferential Edema Measurements - multi extremities 0 []  - Nutritional Assessment / Counseling / Intervention 0 []  - Lower Extremity Assessment (monofilament, tuning fork, pulses) 0 []  - Peripheral Arterial Disease Assessment (using hand held doppler) 0 ASSESSMENTS - Ostomy and/or Continence Assessment and Care []  - Incontinence Assessment and Management 0 []  - Ostomy Care Assessment and Management (repouching, etc.) 0 PROCESS - Coordination of Care X - Simple Patient / Family Education for ongoing care 1 15 []  - Complex (extensive) Patient / Family Education for ongoing care 0 []  - Staff obtains Programmer, systems, Records, Test Results / Process Orders 0 []  - Staff telephones HHA, Nursing Homes / Clarify orders / etc 0 []  - Routine Transfer to another Facility (non-emergent condition) 0 [name], Charles K. ([id number]) []  - Routine Hospital Admission (non-emergent condition) 0 []  - New Admissions / Biomedical engineer / Ordering NPWT, Apligraf, etc. 0 []  - Emergency Hospital Admission (emergent condition) 0 X - Simple Discharge Coordination 1 10 []  - Complex (extensive) Discharge Coordination 0 PROCESS - Special Needs []  - Pediatric / Minor Patient Management 0 []  - Isolation Patient Management 0 []  - Hearing / Language / Visual special needs 0 []  - Assessment of Community assistance (transportation, D/C planning, etc.) 0 []  - Additional assistance / Altered mentation 0 []  - Support Surface(s) Assessment (bed, cushion, seat, etc.) 0 INTERVENTIONS - Wound Cleansing /  Measurement X - Simple Wound Cleansing - one wound 1 5 []  - Complex Wound Cleansing - multiple wounds 0 X - Wound Imaging (photographs - any number of  wounds) 1 5 []  - Wound Tracing (instead of photographs) 0 X - Simple Wound Measurement - one wound 1 5 []  - Complex Wound Measurement - multiple wounds 0 INTERVENTIONS - Wound Dressings X - Small Wound Dressing one or multiple wounds 1 10 []  - Medium Wound Dressing one or multiple wounds 0 []  - Large Wound Dressing one or multiple wounds 0 X - Application of Medications - topical 1 5 []  - Application of Medications - injection 0 INTERVENTIONS - Miscellaneous []  - External ear exam 0 [name], Charles K. ([id number]) []  - Specimen Collection (cultures, biopsies, blood, body fluids, etc.) 0 []  - Specimen(s) / Culture(s) sent or taken to Lab for analysis 0 []  - Patient Transfer (multiple staff / Harrel Lemon Lift / Similar devices) 0 []  - Simple Staple / Suture removal (25 or less) 0 []  - Complex Staple / Suture removal (26 or more) 0 []  - Hypo / Hyperglycemic Management (close monitor of Blood Glucose) 0 []  - Ankle / Brachial Index (ABI) - do not check if billed separately 0 X - Vital Signs 1 5 Has the patient been seen at the hospital within the last three years: Yes Total Score: 80 Level Of Care: New/Established - Level 3 Electronic Signature(s) Signed: 08/23/2016 5:04:14 PM By: [name] Entered By: [name] on 08/23/2016 13:12:25 [name], [name] ([id number]) -------------------------------------------------------------------------------- Encounter Discharge Information Details Patient Name: [name] Date of Service: 08/23/2016 12:30 PM Medical Record Number: [id number] Patient Account Number: [id number] Date of Birth/Sex: May 22, 1940 (76 y.o. Male) Treating RN: [name] Primary Care Merridy Pascoe: [name] Other Clinician: Referring Kelcy Laible: [name] Treating [name]/Extender: [name] in  Treatment: 8 Encounter Discharge Information Items Discharge Pain Level: 0 Discharge Condition: Stable Ambulatory Status: Cane Discharge Destination: Home Transportation: Private Auto Accompanied By: self Schedule Follow-up Appointment: Yes Medication Reconciliation completed No and provided to Patient/Care Bertine Schlottman: Provided on Clinical Summary of Care: 08/23/2016 Form Type Recipient Paper Patient LM Electronic Signature(s) Signed: 08/24/2016 8:53:21 AM By: Ruthine Dose Entered By: Ruthine Dose on 08/23/2016 13:00:23 Lemme, [name] ([id number]) -------------------------------------------------------------------------------- Multi Wound Chart Details Patient Name: [name] Date of Service: 08/23/2016 12:30 PM Medical Record Number: [id number] Patient Account Number: [id number] Date of Birth/Sex: Dec 15, 1940 (76 y.o. Male) Treating RN: [name] Primary Care Delora Gravatt: [name] Other Clinician: Referring Haliey Romberg: [name] Treating Aniqa Hare/Extender: Ricard Dillon Weeks in Treatment: 8 Vital Signs Height(in): 69 Pulse(bpm): 55 Weight(lbs): 147.5 Blood Pressure 100/45 (mmHg): Body Mass Index(BMI): 22 Temperature(F): 97.8 Respiratory Rate 16 (breaths/min): Photos: [1:No Photos] [N/A:N/A] Wound Location: [1:Right Sacrum] [N/A:N/A] Wounding Event: [1:Pressure Injury] [N/A:N/A] Primary Etiology: [1:Pressure Ulcer] [N/A:N/A] Date Acquired: [1:06/14/2016] [N/A:N/A] Weeks of Treatment: [1:8] [N/A:N/A] Wound Status: [1:Open] [N/A:N/A] Measurements L x W x D 1.9x2.1x0.8 [N/A:N/A] (cm) Area (cm) : [1:3.134] [N/A:N/A] Volume (cm) : [1:2.507] [N/A:N/A] % Reduction in Area: [1:91.70%] [N/A:N/A] % Reduction in Volume: 67.00% [N/A:N/A] Classification: [1:Category/Stage IV] [N/A:N/A] Exudate Amount: [1:Large] [N/A:N/A] Exudate Type: [1:Serous] [N/A:N/A] Exudate Color: [1:amber] [N/A:N/A] Wound Margin: [1:Distinct, outline attached]  [N/A:N/A] Granulation Amount: [1:Large (67-100%)] [N/A:N/A] Granulation Quality: [1:Red] [N/A:N/A] Necrotic Amount: [1:Small (1-33%)] [N/A:N/A] Exposed Structures: [1:Fat Layer (Subcutaneous Tissue) Exposed: Yes Muscle: Yes Fascia: No Tendon: No Joint: No Bone: No] [N/A:N/A] Epithelialization: [1:Small (1-33%)] [N/A:N/A] Periwound Skin Texture: Induration: Yes [N/A:N/A] Periwound Skin Dry/Scaly: Yes N/A N/A Moisture: Periwound Skin Color: Erythema: Yes N/A N/A Rubor: Yes Erythema Location: Circumferential N/A N/A Temperature: No Abnormality N/A N/A Tenderness on Yes N/A N/A Palpation: Wound Preparation: Ulcer Cleansing: N/A N/A  Rinsed/Irrigated with Saline Topical Anesthetic Applied: Other: lidocaine 4% Treatment Notes Electronic Signature(s) Signed: 08/23/2016 5:34:31 PM By: Linton Ham MD Entered By: Linton Ham on 08/23/2016 13:06:00 [place]ford, [name] ([id number]) -------------------------------------------------------------------------------- Multi-Disciplinary Care Plan Details Patient Name: [name] Date of Service: 08/23/2016 12:30 PM Medical Record Number: [id number] Patient Account Number: [id number] Date of Birth/Sex: 11/15/1940 (76 y.o. Male) Treating RN: [name] Primary Care Jovontae Banko: [name] Other Clinician: Referring Consuello Lassalle: [name] Treating Wilfredo Canterbury/Extender: [name] in Treatment: 8 Active Inactive ` Abuse / Safety / Falls / Self Care Management Nursing Diagnoses: Potential for falls Goals: Patient will not experience any injury related to falls Date Initiated: 06/28/2016 Target Resolution Date: 10/08/2016 Goal Status: Active Interventions: Assess Activities of Daily Living upon admission and as needed Assess: immobility, friction, shearing, incontinence upon admission and as needed Assess impairment of mobility on admission and as needed per policy Notes: ` Nutrition Nursing  Diagnoses: Imbalanced nutrition Impaired glucose control: actual or potential Potential for alteratiion in Nutrition/Potential for imbalanced nutrition Goals: Patient/caregiver agrees to and verbalizes understanding of need to use nutritional supplements and/or vitamins as prescribed Date Initiated: 06/28/2016 Target Resolution Date: 09/10/2016 Goal Status: Active Patient/caregiver verbalizes understanding of need to maintain therapeutic glucose control per primary care physician Date Initiated: 06/28/2016 Target Resolution Date: 09/10/2016 Goal Status: Active Interventions: MCADAMSRhyse, Loux ([id number]) Assess patient nutrition upon admission and as needed per policy Notes: ` Orientation to the Wound Care Program Nursing Diagnoses: Knowledge deficit related to the wound healing center program Goals: Patient/caregiver will verbalize understanding of the [facility] Date Initiated: 06/28/2016 Target Resolution Date: 07/09/2016 Goal Status: Active Interventions: Provide education on orientation to the wound center Notes: ` Pain, Acute or Chronic Nursing Diagnoses: Pain, acute or chronic: actual or potential Potential alteration in comfort, pain Goals: Patient/caregiver will verbalize adequate pain control between visits Date Initiated: 06/28/2016 Target Resolution Date: 10/08/2016 Goal Status: Active Interventions: Assess comfort goal upon admission Complete pain assessment as per visit requirements Notes: ` Pressure Nursing Diagnoses: Knowledge deficit related to causes and risk factors for pressure ulcer development Knowledge deficit related to management of pressures ulcers Potential for impaired tissue integrity related to pressure, friction, moisture, and shear Goals: Patient will remain free from development of additional pressure ulcers Kirkendall, JUAQUIN LUDINGTON ([id number]) Date Initiated: 06/28/2016 Target Resolution Date: 10/08/2016 Goal Status:  Active Interventions: Assess: immobility, friction, shearing, incontinence upon admission and as needed Assess offloading mechanisms upon admission and as needed Notes: ` Wound/Skin Impairment Nursing Diagnoses: Impaired tissue integrity Knowledge deficit related to ulceration/compromised skin integrity Goals: Ulcer/skin breakdown will have a volume reduction of 80% by week 12 Date Initiated: 06/28/2016 Target Resolution Date: 10/01/2016 Goal Status: Active Interventions: Assess patient/caregiver ability to perform ulcer/skin care regimen upon admission and as needed Assess ulceration(s) every visit Notes: Electronic Signature(s) Signed: 08/23/2016 5:04:14 PM By: [name] Entered By: [name] on 08/23/2016 12:49:25 Jalomo, [name] ([id number]) -------------------------------------------------------------------------------- Pain Assessment Details Patient Name: [name] Date of Service: 08/23/2016 12:30 PM Medical Record Number: [id number] Patient Account Number: [id number] Date of Birth/Sex: [birth year] (76 y.o. Male) Treating RN: [name] Primary Care Charlcie Prisco: [name] Other Clinician: Referring Laresha Bacorn: [name] Treating Cayden Granholm/Extender: Ricard Dillon Weeks in Treatment: 8 Active Problems Location of Pain Severity and Description of Pain Patient Has Paino No Site Locations Pain Management and Medication Current Pain Management: Electronic Signature(s) Signed: 08/23/2016 5:04:14 PM By: [name] Entered By: [name] on 08/23/2016 12:42:18 Moodie, Fritz Pickerel  Raliegh Ip ([id number]) -------------------------------------------------------------------------------- Patient/Caregiver Education Details Patient Name: [name] Date of Service: 08/23/2016 12:30 PM Medical Record Patient Account Number: [id number] Number: Treating RN: [name] 22-Jul-1940 (75 y.o. Other Clinician: Date of Birth/Gender: Male)  Treating ROBSON, Yoak[place] Primary Care Physician: [name] Physician/Extender: G Referring Physician: Nettie Elm in Treatment: 8 Education Assessment Education Provided To: Patient Education Topics Provided Wound/Skin Impairment: Handouts: Other: wound care as ordered Methods: Demonstration, Explain/Verbal Responses: State content correctly Electronic Signature(s) Signed: 08/23/2016 5:04:14 PM By: [name] Entered By: [name] on 08/23/2016 12:50:37 Wivell, [name] ([id number]) -------------------------------------------------------------------------------- Wound Assessment Details Patient Name: [name] Date of Service: 08/23/2016 12:30 PM Medical Record Number: [id number] Patient Account Number: [id number] Date of Birth/Sex: 11/18/1940 (76 y.o. Male) Treating RN: [name] Primary Care Mayo Owczarzak: [name] Other Clinician: Referring Xavi Tomasik: [name] Treating Shawnte Demarest/Extender: Ricard Dillon Weeks in Treatment: 8 Wound Status Wound Number: 1 Primary Etiology: Pressure Ulcer Wound Location: Right Sacrum Wound Status: Open Wounding Event: Pressure Injury Date Acquired: 06/14/2016 Weeks Of Treatment: 8 Clustered Wound: No Photos Photo Uploaded By: [name] on 08/23/2016 13:16:04 Wound Measurements Length: (cm) 1.9 Width: (cm) 2.1 Depth: (cm) 0.8 Area: (cm) 3.134 Volume: (cm) 2.507 % Reduction in Area: 91.7% % Reduction in Volume: 67% Epithelialization: Small (1-33%) Tunneling: No Undermining: No Wound Description Classification: Category/Stage IV Wound Margin: Distinct, outline attached Exudate Amount: Large Exudate Type: Serous Exudate Color: amber Foul Odor After Cleansing: No Slough/Fibrino Yes Wound Bed Granulation Amount: Large (67-100%) Exposed Structure Granulation Quality: Red Fascia Exposed: No Necrotic Amount: Small (1-33%) Fat Layer (Subcutaneous Tissue) Exposed: Yes Necrotic  Quality: Adherent Slough Tendon Exposed: No Lagares, Loudon K. ([id number]) Muscle Exposed: Yes Necrosis of Muscle: Yes Joint Exposed: No Bone Exposed: No Periwound Skin Texture Texture Color No Abnormalities Noted: No No Abnormalities Noted: No Induration: Yes Erythema: Yes Erythema Location: Circumferential Moisture Rubor: Yes No Abnormalities Noted: No Dry / Scaly: Yes Temperature / Pain Temperature: No Abnormality Tenderness on Palpation: Yes Wound Preparation Ulcer Cleansing: Rinsed/Irrigated with Saline Topical Anesthetic Applied: Other: lidocaine 4%, Treatment Notes Wound #1 (Right Sacrum) 1. Cleansed with: Clean wound with Normal Saline 2. Anesthetic Topical Lidocaine 4% cream to wound bed prior to debridement 4. Dressing Applied: Santyl Ointment 5. Secondary Dressing Applied Bordered Foam Dressing Dry Gauze Electronic Signature(s) Signed: 08/23/2016 5:04:14 PM By: [name] Entered By: [name] on 08/23/2016 12:47:51 DeQuincy, [name] ([id number]) -------------------------------------------------------------------------------- Amesti Details Patient Name: [name] Date of Service: 08/23/2016 12:30 PM Medical Record Number: [id number] Patient Account Number: [id number] Date of Birth/Sex: [birth year] (76 y.o. Male) Treating RN: [name] Primary Care Nikka Hakimian: [name] Other Clinician: Referring Davanta Meuser: [name] Treating Delmont Prosch/Extender: [name] in Treatment: 8 Vital Signs Time Taken: 12:42 Temperature (F): 97.8 Height (in): 69 Pulse (bpm): 55 Weight (lbs): 147.5 Respiratory Rate (breaths/min): 16 Body Mass Index (BMI): 21.8 Blood Pressure (mmHg): 100/45 Reference Range: 80 - 120 mg / dl Electronic Signature(s) Signed: 08/23/2016 5:04:14 PM By: [name] Entered By: [name] on 08/23/2016 12:42:41

## 2016-08-30 ENCOUNTER — Encounter: Payer: Medicare Other | Admitting: Internal Medicine

## 2016-08-30 DIAGNOSIS — E11622 Type 2 diabetes mellitus with other skin ulcer: Secondary | ICD-10-CM | POA: Diagnosis not present

## 2016-09-01 NOTE — Progress Notes (Signed)
[name], [name] ([id number]) Visit Report for 08/30/2016 Arrival Information Details Patient Name: [name], [name] Date of Service: 08/30/2016 11:30 AM Medical Record Number: [id number] Patient Account Number: [id number] Date of Birth/Sex: [birth year] (75 y.o. Male) Treating RN: Cornell Barman Primary Care Kawana Hegel: Johny Drilling Other Clinician: Referring Essence Merle: Johny Drilling Treating Ella Guillotte/Extender: Tito Dine in Treatment: 9 Visit Information History Since Last Visit Added or deleted any medications: No Patient Arrived: Ambulatory Any new allergies or adverse reactions: No Arrival Time: 11:30 Had a fall or experienced change in No Accompanied By: self activities of daily living that may affect Transfer Assistance: None risk of falls: Patient Identification Verified: Yes Signs or symptoms of abuse/neglect since last No Secondary Verification Process Yes visito Completed: Hospitalized since last visit: No Patient Requires Transmission-Based No Has Dressing in Place as Prescribed: Yes Precautions: Pain Present Now: No Patient Has Alerts: Yes Patient Alerts: DM II Electronic Signature(s) Signed: 08/30/2016 5:02:20 PM By: Gretta Cool, BSN, RN, CWS, Kim RN, BSN Entered By: Gretta Cool, BSN, RN, CWS, Kim on 08/30/2016 11:31:13 [place], [name] ([id number]) -------------------------------------------------------------------------------- Encounter Discharge Information Details Patient Name: [name] Date of Service: 08/30/2016 11:30 AM Medical Record Number: [id number] Patient Account Number: [id number] Date of Birth/Sex: [birth year] (76 y.o. Male) Treating RN: Cornell Barman Primary Care Mariesa Grieder: Johny Drilling Other Clinician: Referring Zakaiya Lares: Johny Drilling Treating Yerania Chamorro/Extender: Tito Dine in Treatment: 9 Encounter Discharge Information Items Discharge Pain Level: 0 Discharge Condition: Stable Ambulatory Status: Cane Discharge Destination:  Home Private Transportation: Auto Accompanied By: self Schedule Follow-up Appointment: Yes Medication Reconciliation completed and Yes provided to Patient/Care Edom Schmuhl: Patient Clinical Summary of Care: Declined Electronic Signature(s) Signed: 08/30/2016 5:02:20 PM By: Gretta Cool, BSN, RN, CWS, Kim RN, BSN Entered By: Gretta Cool, BSN, RN, CWS, Kim on 08/30/2016 11:57:38 [place], [name] ([id number]) -------------------------------------------------------------------------------- Multi Wound Chart Details Patient Name: [name] Date of Service: 08/30/2016 11:30 AM Medical Record Number: [id number] Patient Account Number: [id number] Date of Birth/Sex: [birth year] (76 y.o. Male) Treating RN: Cornell Barman Primary Care Jozelynn Danielson: Johny Drilling Other Clinician: Referring Jarae Panas: Johny Drilling Treating Diarra Kos/Extender: Ricard Dillon Weeks in Treatment: 9 Vital Signs Height(in): 69 Pulse(bpm): 88 Weight(lbs): 147.5 Blood Pressure 148/70 (mmHg): Body Mass Index(BMI): 22 Temperature(F): 97.8 Respiratory Rate 16 (breaths/min): Photos: [1:No Photos] [N/A:N/A] Wound Location: [1:Right Sacrum] [N/A:N/A] Wounding Event: [1:Pressure Injury] [N/A:N/A] Primary Etiology: [1:Pressure Ulcer] [N/A:N/A] Date Acquired: [1:06/14/2016] [N/A:N/A] Weeks of Treatment: [1:9] [N/A:N/A] Wound Status: [1:Open] [N/A:N/A] Measurements L x W x D 1.5x1.5x0.8 [N/A:N/A] (cm) Area (cm) : [1:1.767] [N/A:N/A] Volume (cm) : [1:1.414] [N/A:N/A] % Reduction in Area: [1:95.30%] [N/A:N/A] % Reduction in Volume: 81.40% [N/A:N/A] Classification: [1:Category/Stage IV] [N/A:N/A] Exudate Amount: [1:Large] [N/A:N/A] Exudate Type: [1:Serous] [N/A:N/A] Exudate Color: [1:amber] [N/A:N/A] Wound Margin: [1:Distinct, outline attached] [N/A:N/A] Granulation Amount: [1:Large (67-100%)] [N/A:N/A] Granulation Quality: [1:Red] [N/A:N/A] Necrotic Amount: [1:Small (1-33%)] [N/A:N/A] Exposed Structures: [1:Fat Layer  (Subcutaneous Tissue) Exposed: Yes Muscle: Yes Fascia: No Tendon: No Joint: No Bone: No] [N/A:N/A] Epithelialization: [1:Small (1-33%)] [N/A:N/A] Debridement: [N/A:N/A] Chemical/enzymatic - Non-Selective Pre-procedure 11:30 N/A N/A Verification/Time Out Taken: Procedural Pain: 0 N/A N/A Post Procedural Pain: 0 N/A N/A Debridement Treatment Procedure was tolerated N/A N/A Response: well Post Debridement 1.5x1.5x0.8 N/A N/A Measurements L x W x D (cm) Post Debridement 1.414 N/A N/A Volume: (cm) Post Debridement Category/Stage IV N/A N/A Stage: Periwound Skin Texture: Induration: Yes N/A N/A Periwound Skin Dry/Scaly: Yes N/A N/A Moisture: Periwound Skin Color: Erythema: Yes N/A N/A Rubor: Yes Erythema Location: Circumferential N/A N/A Temperature: No Abnormality  N/A N/A Tenderness on Yes N/A N/A Palpation: Wound Preparation: Ulcer Cleansing: N/A N/A Rinsed/Irrigated with Saline Topical Anesthetic Applied: Other: lidocaine 4% Procedures Performed: Debridement N/A N/A Treatment Notes Wound #1 (Right Sacrum) 1. Cleansed with: Clean wound with Normal Saline 4. Dressing Applied: Santyl Ointment 5. Secondary Dressing Applied Bordered Foam Dressing Dry Gauze Electronic Signature(s) Signed: 08/31/2016 7:49:24 AM By: Linton Ham MD Entered By: Linton Ham on 08/30/2016 12:25:17 [place], [name] ([id number]) [place], [name] ([id number]) -------------------------------------------------------------------------------- Multi-Disciplinary Care Plan Details Patient Name: [name] Date of Service: 08/30/2016 11:30 AM Medical Record Number: [id number] Patient Account Number: [id number] Date of Birth/Sex: [birth year] (76 y.o. Male) Treating RN: Cornell Barman Primary Care Reagann Dolce: Johny Drilling Other Clinician: Referring Merilyn Pagan: Johny Drilling Treating Alysiah Suppa/Extender: Tito Dine in Treatment: 9 Active Inactive ` Abuse / Safety / Falls / Self  Care Management Nursing Diagnoses: Potential for falls Goals: Patient will not experience any injury related to falls Date Initiated: 06/28/2016 Target Resolution Date: 10/08/2016 Goal Status: Active Interventions: Assess Activities of Daily Living upon admission and as needed Assess: immobility, friction, shearing, incontinence upon admission and as needed Assess impairment of mobility on admission and as needed per policy Notes: ` Nutrition Nursing Diagnoses: Imbalanced nutrition Impaired glucose control: actual or potential Potential for alteratiion in Nutrition/Potential for imbalanced nutrition Goals: Patient/caregiver agrees to and verbalizes understanding of need to use nutritional supplements and/or vitamins as prescribed Date Initiated: 06/28/2016 Target Resolution Date: 09/10/2016 Goal Status: Active Patient/caregiver verbalizes understanding of need to maintain therapeutic glucose control per primary care physician Date Initiated: 06/28/2016 Target Resolution Date: 09/10/2016 Goal Status: Active Interventions: MCADAMSNaithen, [place] ([id number]) Assess patient nutrition upon admission and as needed per policy Notes: ` Orientation to the Wound Care Program Nursing Diagnoses: Knowledge deficit related to the wound healing center program Goals: Patient/caregiver will verbalize understanding of the [facility] Date Initiated: 06/28/2016 Target Resolution Date: 07/09/2016 Goal Status: Active Interventions: Provide education on orientation to the wound center Notes: ` Pain, Acute or Chronic Nursing Diagnoses: Pain, acute or chronic: actual or potential Potential alteration in comfort, pain Goals: Patient/caregiver will verbalize adequate pain control between visits Date Initiated: 06/28/2016 Target Resolution Date: 10/08/2016 Goal Status: Active Interventions: Assess comfort goal upon admission Complete pain assessment as per visit  requirements Notes: ` Pressure Nursing Diagnoses: Knowledge deficit related to causes and risk factors for pressure ulcer development Knowledge deficit related to management of pressures ulcers Potential for impaired tissue integrity related to pressure, friction, moisture, and shear Goals: Patient will remain free from development of additional pressure ulcers [name], [name] ([id number]) Date Initiated: 06/28/2016 Target Resolution Date: 10/08/2016 Goal Status: Active Interventions: Assess: immobility, friction, shearing, incontinence upon admission and as needed Assess offloading mechanisms upon admission and as needed Notes: ` Wound/Skin Impairment Nursing Diagnoses: Impaired tissue integrity Knowledge deficit related to ulceration/compromised skin integrity Goals: Ulcer/skin breakdown will have a volume reduction of 80% by week 12 Date Initiated: 06/28/2016 Target Resolution Date: 10/01/2016 Goal Status: Active Interventions: Assess patient/caregiver ability to perform ulcer/skin care regimen upon admission and as needed Assess ulceration(s) every visit Notes: Electronic Signature(s) Signed: 08/30/2016 5:02:20 PM By: Gretta Cool, BSN, RN, CWS, Kim RN, BSN Entered By: Gretta Cool, BSN, RN, CWS, Kim on 08/30/2016 11:41:31 [name], [name] ([id number]) -------------------------------------------------------------------------------- Pain Assessment Details Patient Name: [name] Date of Service: 08/30/2016 11:30 AM Medical Record Number: [id number] Patient Account Number: [id number] Date of Birth/Sex: Nov 10, 1940 (76 y.o. Male) Treating RN: Cornell Barman Primary Care Columbia Pandey: [name],  Freida Busman Other Clinician: Referring Kazi Reppond: Johny Drilling Treating Javarie Crisp/Extender: Tito Dine in Treatment: 9 Active Problems Location of Pain Severity and Description of Pain Patient Has Paino No Site Locations With Dressing Change: No Pain Management and Medication Current Pain  Management: Goals for Pain Management Topical or injectable lidocaine is offered to patient for acute pain when surgical debridement is performed. If needed, Patient is instructed to use over the counter pain medication for the following 24-48 hours after debridement. Wound care MDs do not prescribed pain medications. Patient has chronic pain or uncontrolled pain. Patient has been instructed to make an appointment with their Primary Care Physician for pain management. Electronic Signature(s) Signed: 08/30/2016 5:02:20 PM By: Gretta Cool, BSN, RN, CWS, Kim RN, BSN Entered By: Gretta Cool, BSN, RN, CWS, Kim on 08/30/2016 11:31:26 [name], [name] ([id number]) -------------------------------------------------------------------------------- Patient/Caregiver Education Details Patient Name: [name] Date of Service: 08/30/2016 11:30 AM Medical Record Patient Account Number: [id number] Number: Treating RN: Cornell Barman December 09, 1940 (75 y.o. Other Clinician: Date of Birth/Gender: Male) Treating ROBSON, [name] Primary Care Physician: Johny Drilling Physician/Extender: G Referring Physician: Nettie Elm in Treatment: 9 Education Assessment Education Provided To: Patient Education Topics Provided Pressure: Handouts: Other: keep pressure off while driving; get out and walk every hour Methods: Demonstration, Explain/Verbal Responses: State content correctly Electronic Signature(s) Signed: 08/30/2016 5:02:20 PM By: Gretta Cool, BSN, RN, CWS, Kim RN, BSN Entered By: Gretta Cool, BSN, RN, CWS, Kim on 08/30/2016 11:58:10 [name], [name] ([id number]) -------------------------------------------------------------------------------- Wound Assessment Details Patient Name: [name] Date of Service: 08/30/2016 11:30 AM Medical Record Number: [id number] Patient Account Number: [id number] Date of Birth/Sex: 1940/12/13 (76 y.o. Male) Treating RN: Cornell Barman Primary Care Maksim Peregoy: Johny Drilling Other Clinician: Referring Geanette Buonocore: Johny Drilling Treating Bonnie Roig/Extender: Ricard Dillon Weeks in Treatment: 9 Wound Status Wound Number: 1 Primary Etiology: Pressure Ulcer Wound Location: Right Sacrum Wound Status: Open Wounding Event: Pressure Injury Date Acquired: 06/14/2016 Weeks Of Treatment: 9 Clustered Wound: No Photos Photo Uploaded By: Gretta Cool, BSN, RN, CWS, Kim on 08/30/2016 17:03:46 Wound Measurements Length: (cm) 1.5 Width: (cm) 1.5 Depth: (cm) 0.8 Area: (cm) 1.767 Volume: (cm) 1.414 % Reduction in Area: 95.3% % Reduction in Volume: 81.4% Epithelialization: Small (1-33%) Tunneling: No Undermining: No Wound Description Classification: Category/Stage IV Wound Margin: Distinct, outline attached Exudate Amount: Large Exudate Type: Serous Exudate Color: amber Foul Odor After Cleansing: No Slough/Fibrino Yes Wound Bed Granulation Amount: Large (67-100%) Exposed Structure Granulation Quality: Red Fascia Exposed: No Necrotic Amount: Small (1-33%) Fat Layer (Subcutaneous Tissue) Exposed: Yes Necrotic Quality: Adherent Slough Tendon Exposed: No Muscle Exposed: Yes Necrosis of Muscle: Yes Joint Exposed: No [place], Charles K. ([id number]) Bone Exposed: No Periwound Skin Texture Texture Color No Abnormalities Noted: No No Abnormalities Noted: No Induration: Yes Erythema: Yes Erythema Location: Circumferential Moisture Rubor: Yes No Abnormalities Noted: No Dry / Scaly: Yes Temperature / Pain Temperature: No Abnormality Tenderness on Palpation: Yes Wound Preparation Ulcer Cleansing: Rinsed/Irrigated with Saline Topical Anesthetic Applied: Other: lidocaine 4%, Treatment Notes Wound #1 (Right Sacrum) 1. Cleansed with: Clean wound with Normal Saline 4. Dressing Applied: Santyl Ointment 5. Secondary Dressing Applied Bordered Foam Dressing Dry Gauze Electronic Signature(s) Signed: 08/30/2016 5:02:20 PM By: Gretta Cool, BSN, RN, CWS, Kim RN,  BSN Entered By: Gretta Cool, BSN, RN, CWS, Kim on 08/30/2016 11:41:19 [name], [name] ([id number]) -------------------------------------------------------------------------------- [place] Details Patient Name: [name] Date of Service: 08/30/2016 11:30 AM Medical Record Number: [id number] Patient Account Number: [id number] Date of Birth/Sex: [birth year] (76 y.o. Male) Treating  RN: Cornell Barman Primary Care Keylen Uzelac: Johny Drilling Other Clinician: Referring Nakaila Freeze: Johny Drilling Treating Ajahni Nay/Extender: Tito Dine in Treatment: 9 Vital Signs Time Taken: 11:31 Temperature (F): 97.8 Height (in): 69 Pulse (bpm): 88 Weight (lbs): 147.5 Respiratory Rate (breaths/min): 16 Body Mass Index (BMI): 21.8 Blood Pressure (mmHg): 148/70 Reference Range: 80 - 120 mg / dl Electronic Signature(s) Signed: 08/30/2016 5:02:20 PM By: Gretta Cool, BSN, RN, CWS, Kim RN, BSN Entered By: Gretta Cool, BSN, RN, CWS, Kim on 08/30/2016 11:31:57

## 2016-09-01 NOTE — Progress Notes (Signed)
[name] ([id number]) Visit Report for 08/30/2016 Debridement Details Patient Name: [name], [name] Date of Service: 08/30/2016 11:30 AM Medical Record Patient Account Number: [id number] Number: Treating RN: Cornell Barman 1940/04/11 (76 y.o. Other Clinician: Date of Birth/Sex: Male) Treating ROBSON, MICHAEL Primary Care Provider: Johny Drilling Provider/Extender: G Referring Provider: Nettie Elm in Treatment: 9 Debridement Performed for Wound #1 Right Sacrum Assessment: Performed By: Physician Ricard Dillon, MD Debridement: Chemical/enzymatic Debridement Non-Selective Description: Pre-procedure Verification/Time Out Yes - 11:30 Taken: Start Time: 11:30 End Time: 11:32 Procedural Pain: 0 Post Procedural Pain: 0 Response to Treatment: Procedure was tolerated well Post Debridement Measurements of Total Wound Length: (cm) 1.5 Stage: Category/Stage IV Width: (cm) 1.5 Depth: (cm) 0.8 Volume: (cm) 1.414 Character of Wound/Ulcer Post Stable Debridement: Post Procedure Diagnosis Same as Pre-procedure Electronic Signature(s) Signed: 08/30/2016 5:02:20 PM By: Gretta Cool, BSN, RN, CWS, Kim RN, BSN Signed: 08/31/2016 7:49:24 AM By: Linton Ham MD Entered By: Linton Ham on 08/30/2016 12:25:32 Schank, [name] ([id number]) -------------------------------------------------------------------------------- HPI Details Patient Name: [name] Date of Service: 08/30/2016 11:30 AM Medical Record Patient Account Number: [id number] Number: Treating RN: Cornell Barman 1940-01-09 (76 y.o. Other Clinician: Date of Birth/Sex: Male) Treating ROBSON, MICHAEL Primary Care Provider: Johny Drilling Provider/Extender: G Referring Provider: Nettie Elm in Treatment: 9 History of Present Illness HPI Description: 06/28/16; this is a 76 year old man who was admitted to hospital from 6/13 through 06/18/16 at [facility] [place] [facility] after being found  down in a closet in his home. It is not exactly clear how long he was actually there although it was probably for days. His total CK maximum was at 2582. He was admitted with delirium, possibly heat related illness, possible subendocardial MI. An MRI of the brain was negative. During this hospitalization he was noted to have areas of pressure-related injury for the time he was spent on the floor on his back he was stated to have a stage II pressure ulcer over his buttocks. Multiple excoriations were also noted. He has advanced home care about doing physical therapy there requesting a nurse which seems reasonable. The patient is a diabetic on metformin. They're using bacitracin to all these areas. He was prescribed doxycycline which I believe he is still on. 07/05/16; patient with pressure ulcers on his right greater than left buttock surrounding his coccyx. Excoriation over the right lateral ankle an excoriation on his upper back. They did not get any Santyl or medihoney. We will take care of this today. 07/12/16 unfortunately patient's wounds appeared to be doing better across the board and the eschar that is overlying the sacral wound has loosened up to where I could debride somewhat today. There's no evidence of 07/20/16; butterfly shaped wound across the sacrum and the surrounding skin and soft tissue. The area on the right is much larger than the left. The area on the left looks healthy on the right still and necrotic nonviable surface. Using Santyl 07/26/16; butterfly shaped wound across the sacrum and surrounding skin and soft tissue. The area on the right much larger than the left. Extensive debridement on the right last week post-debridement culture showed enterococcus faecalis. This is ampicillin sensitive and he'll definitely need Augmentin. We have been using Santyl.Kermit Balo improvement this week 08/02/16; butterfly shaped wound across the sacrum and surrounding skin and soft tissue. The  area on the left is just about closed the area on the right has settled into a stage III wound initially unstageable on arrival. This will  slow bleed ongoing debridement both mechanically and enzymatically although we are making good progress each week. Using Santyl/moist gauze/border foam 08/09/16 on evaluation today patient presents with a slough covered sacral wound but he tells me he experiences some mild discomfort regarding. This pain seems to be worse mainly because cleansing and he rates this to be a 2 out of 10. With that being said he does seem to be unfortunately after last week's debridement she states that he had a snack a lot of bleeding upon arrival at home making some progress with the Santyl at this point. His daughter actually does the dressing changes for him daily. Wish they had not noted up until that point. Otherwise things have been going well in regard to treatment. Patient has no nausea, vomiting, or diarrhea as well as no fever or chills. 08/16/16 on evaluation today patient appears to be doing well in regard to his sacral ulcer. He is the dressing changes. The only concern that has been made and brought up by his daughter at this point is that the [facility]. ([id number]) dressings are being sent from the supply company only have a 2 x 2 pad and the wound is larger than 2 x 2 in dimension. She therefore seven difficulty getting the dressing to seal appropriately without leaking or causing other issues. Obviously this has been frustrating. Nonetheless otherwise his wounds seems to be doing well. No fevers, chills, nausea, or vomiting noted at this time. 08/23/16; the patient continues with Santyl as the primary dressing and in the 3 weeks since I have seen this he has made remarkable improvement. His daughter is changing the dressing. 08/30/16; patient continues to make good improvement with Santyl. Wound is smaller. This is on the right buttock side of what was  originally a butterfly shaped wound. Electronic Signature(s) Signed: 08/31/2016 7:49:24 AM By: Linton Ham MD Entered By: Linton Ham on 08/30/2016 12:26:09 [name] ([id number]) -------------------------------------------------------------------------------- Physical Exam Details Patient Name: [name] Date of Service: 08/30/2016 11:30 AM Medical Record Patient Account Number: [id number] Number: Treating RN: Cornell Barman 1940-09-05 (75 y.o. Other Clinician: Date of Birth/Sex: Male) Treating ROBSON, MICHAEL Primary Care Provider: Johny Drilling Provider/Extender: G Referring Provider: Nettie Elm in Treatment: 9 Constitutional Patient is hypertensive.. Pulse regular and within target range for patient.Marland Kitchen Respirations regular, non-labored and within target range.. Temperature is normal and within the target range for the patient.Marland Kitchen appears in no distress. Notes Wound exam; the original butterfly shaped wound has healed on the left side of his sacrum. The area on the right which was a deep stage III wound is now closing and quite nicely. There is a healthy granulated base. No debridement is necessary. There is no need to change the primary dressing at this point which is Environmental health practitioner) Signed: 08/31/2016 7:49:24 AM By: Linton Ham MD Entered By: Linton Ham on 08/30/2016 12:28:02 Early, [name] ([id number]) -------------------------------------------------------------------------------- Physician Orders Details Patient Name: [name] Date of Service: 08/30/2016 11:30 AM Medical Record Patient Account Number: [id number] Number: Treating RN: Cornell Barman Jan 23, 1940 (75 y.o. Other Clinician: Date of Birth/Sex: Male) Treating ROBSON, MICHAEL Primary Care Provider: Johny Drilling Provider/Extender: G Referring Provider: Nettie Elm in Treatment: 9 Verbal / Phone Orders: No Diagnosis  Coding Wound Cleansing Wound #1 Right Sacrum o Cleanse wound with mild soap and water Primary Wound Dressing Wound #1 Right Sacrum o Santyl Ointment Secondary Dressing Wound #1 Right Sacrum o Dry Gauze o Boardered Foam  Dressing Dressing Change Frequency Wound #1 Right Sacrum o Change dressing every day. Follow-up Appointments Wound #1 Right Sacrum o Return Appointment in 1 week. Off-Loading Wound #1 Right Sacrum o Turn and reposition every 2 hours Electronic Signature(s) Signed: 08/30/2016 5:02:20 PM By: Gretta Cool, BSN, RN, CWS, Kim RN, BSN Signed: 08/31/2016 7:49:24 AM By: Linton Ham MD Entered By: Gretta Cool, BSN, RN, CWS, Kim on 08/30/2016 11:42:28 Twist, [name] -------------------------------------------------------------------------------- Problem List Details Patient Name: [name] Date of Service: 08/30/2016 11:30 AM Medical Record Patient Account Number: [id number] Number: Treating RN: Cornell Barman 17-Dec-1940 (75 y.o. Other Clinician: Date of Birth/Sex: Male) Treating ROBSON, MICHAEL Primary Care Provider: Johny Drilling Provider/Extender: G Referring Provider: Nettie Elm in Treatment: 9 Active Problems ICD-10 Encounter Code Description Active Date Diagnosis L89.150 Pressure ulcer of sacral region, unstageable 06/28/2016 Yes S91.011D Laceration without foreign body, right ankle, subsequent 06/28/2016 Yes encounter S21.219D Laceration without foreign body of unspecified back wall 06/28/2016 Yes of thorax without penetration into thoracic cavity, subsequent encounter E11.622 Type 2 diabetes mellitus with other skin ulcer 06/28/2016 Yes Inactive Problems Resolved Problems Electronic Signature(s) Signed: 08/31/2016 7:49:24 AM By: Linton Ham MD Entered By: Linton Ham on 08/30/2016 12:25:11 Stuhr, [name]  ([id number]) -------------------------------------------------------------------------------- Progress Note Details Patient Name: [name] Date of Service: 08/30/2016 11:30 AM Medical Record Patient Account Number: [id number] Number: Treating RN: Cornell Barman [birth year] (75 y.o. Other Clinician: Date of Birth/Sex: Male) Treating ROBSON, MICHAEL Primary Care Provider: Johny Drilling Provider/Extender: G Referring Provider: Nettie Elm in Treatment: 9 Subjective History of Present Illness (HPI) 06/28/16; this is a 76 year old man who was admitted to hospital from 6/13 through 06/18/16 at [facility] after being found down in a closet in his home. It is not exactly clear how long he was actually there although it was probably for days. His total CK maximum was at 2582. He was admitted with delirium, possibly heat related illness, possible subendocardial MI. An MRI of the brain was negative. During this hospitalization he was noted to have areas of pressure-related injury for the time he was spent on the floor on his back he was stated to have a stage II pressure ulcer over his buttocks. Multiple excoriations were also noted. He has advanced home care about doing physical therapy there requesting a nurse which seems reasonable. The patient is a diabetic on metformin. They're using bacitracin to all these areas. He was prescribed doxycycline which I believe he is still on. 07/05/16; patient with pressure ulcers on his right greater than left buttock surrounding his coccyx. Excoriation over the right lateral ankle an excoriation on his upper back. They did not get any Santyl or medihoney. We will take care of this today. 07/12/16 unfortunately patient's wounds appeared to be doing better across the board and the eschar that is overlying the sacral wound has loosened up to where I could debride somewhat today. There's no evidence of 07/20/16; butterfly shaped wound  across the sacrum and the surrounding skin and soft tissue. The area on the right is much larger than the left. The area on the left looks healthy on the right still and necrotic nonviable surface. Using Santyl 07/26/16; butterfly shaped wound across the sacrum and surrounding skin and soft tissue. The area on the right much larger than the left. Extensive debridement on the right last week post-debridement culture showed enterococcus faecalis. This is ampicillin sensitive and he'll definitely need Augmentin. We have been using Santyl.Kermit Balo improvement this week  08/02/16; butterfly shaped wound across the sacrum and surrounding skin and soft tissue. The area on the left is just about closed the area on the right has settled into a stage III wound initially unstageable on arrival. This will slow bleed ongoing debridement both mechanically and enzymatically although we are making good progress each week. Using Santyl/moist gauze/border foam 08/09/16 on evaluation today patient presents with a slough covered sacral wound but he tells me he experiences some mild discomfort regarding. This pain seems to be worse mainly because cleansing and he rates this to be a 2 out of 10. With that being said he does seem to be unfortunately after last week's debridement she states that he had a snack a lot of bleeding upon arrival at home making some progress with the Santyl at this point. His daughter actually does the dressing changes for him daily. Wish they had not noted up until that point. Otherwise things have been going well in regard to treatment. Patient has no nausea, vomiting, or diarrhea as well as no fever or chills. 08/16/16 on evaluation today patient appears to be doing well in regard to his sacral ulcer. He is the dressing Mccadden, Vuk K. ([id number]) changes. The only concern that has been made and brought up by his daughter at this point is that the dressings are being sent from the supply  company only have a 2 x 2 pad and the wound is larger than 2 x 2 in dimension. She therefore seven difficulty getting the dressing to seal appropriately without leaking or causing other issues. Obviously this has been frustrating. Nonetheless otherwise his wounds seems to be doing well. No fevers, chills, nausea, or vomiting noted at this time. 08/23/16; the patient continues with Santyl as the primary dressing and in the 3 weeks since I have seen this he has made remarkable improvement. His daughter is changing the dressing. 08/30/16; patient continues to make good improvement with Santyl. Wound is smaller. This is on the right buttock side of what was originally a butterfly shaped wound. Objective Constitutional Patient is hypertensive.. Pulse regular and within target range for patient.Marland Kitchen Respirations regular, non-labored and within target range.. Temperature is normal and within the target range for the patient.Marland Kitchen appears in no distress. Vitals Time Taken: 11:31 AM, Height: 69 in, Weight: 147.5 lbs, BMI: 21.8, Temperature: 97.8 F, Pulse: 88 bpm, Respiratory Rate: 16 breaths/min, Blood Pressure: 148/70 mmHg. General Notes: Wound exam; the original butterfly shaped wound has healed on the left side of his sacrum. The area on the right which was a deep stage III wound is now closing and quite nicely. There is a healthy granulated base. No debridement is necessary. There is no need to change the primary dressing at this point which is Santyl Integumentary (Hair, Skin) Wound #1 status is Open. Original cause of wound was Pressure Injury. The wound is located on the Right Sacrum. The wound measures 1.5cm length x 1.5cm width x 0.8cm depth; 1.767cm^2 area and 1.414cm^3 volume. There is muscle and Fat Layer (Subcutaneous Tissue) Exposed exposed. There is no tunneling or undermining noted. There is a large amount of serous drainage noted. The wound margin is distinct with the outline attached to the  wound base. There is large (67-100%) red granulation within the wound bed. There is a small (1-33%) amount of necrotic tissue within the wound bed including Adherent Slough and Necrosis of Muscle. The periwound skin appearance exhibited: Induration, Dry/Scaly, Rubor, Erythema. The surrounding wound skin color is noted  with erythema which is circumferential. Periwound temperature was noted as No Abnormality. The periwound has tenderness on palpation. ZACHRY, HOPFENSPERGER ([id number]) Assessment Active Problems ICD-10 L89.150 - Pressure ulcer of sacral region, unstageable S91.011D - Laceration without foreign body, right ankle, subsequent encounter S21.219D - Laceration without foreign body of unspecified back wall of thorax without penetration into thoracic cavity, subsequent encounter E11.622 - Type 2 diabetes mellitus with other skin ulcer Procedures Wound #1 Pre-procedure diagnosis of Wound #1 is a Pressure Ulcer located on the Right Sacrum . There was a Non- Selective Chemical/enzymatic debridement (non-viable tissue was removed) performed by Ricard Dillon, MD.. A time out was conducted at 11:30, prior to the start of the procedure. The procedure was tolerated well with a pain level of 0 throughout and a pain level of 0 following the procedure. Post Debridement Measurements: 1.5cm length x 1.5cm width x 0.8cm depth; 1.414cm^3 volume. Post debridement Stage noted as Category/Stage IV. Character of Wound/Ulcer Post Debridement is stable. Post procedure Diagnosis Wound #1: Same as Pre-Procedure Plan Wound Cleansing: Wound #1 Right Sacrum: Cleanse wound with mild soap and water Primary Wound Dressing: Wound #1 Right Sacrum: Santyl Ointment Secondary Dressing: Wound #1 Right Sacrum: Dry Gauze Boardered Foam Dressing Dressing Change Frequency: Wound #1 Right Sacrum: Change dressing every day. Follow-up Appointments: EATHON, VALADE ([id number]) Wound #1 Right Sacrum: Return  Appointment in 1 week. Off-Loading: Wound #1 Right Sacrum: Turn and reposition every 2 hours #1 I'm going to continue with the Santyl border foam #2 [place] the [place] I've counseled him to keep the pressure off his lower sacrum and to take his weight on his upper thighs and buttocks Electronic Signature(s) Signed: 08/31/2016 7:49:24 AM By: Linton Ham MD Entered By: Linton Ham on 08/30/2016 12:28:35 Crosson, [name] ([id number]) -------------------------------------------------------------------------------- SuperBill Details Patient Name: [name] Date of Service: 08/30/2016 Medical Record Patient Account Number: [id number] Number: Treating RN: Cornell Barman 05/08/1940 (75 y.o. Other Clinician: Date of Birth/Sex: Male) Treating ROBSON, MICHAEL Primary Care Provider: Johny Drilling Provider/Extender: G Referring Provider: Nettie Elm in Treatment: 9 Diagnosis Coding ICD-10 Codes Code Description L89.150 Pressure ulcer of sacral region, unstageable S91.011D Laceration without foreign body, right ankle, subsequent encounter Laceration without foreign body of unspecified back wall of thorax without penetration into S21.219D thoracic cavity, subsequent encounter E11.622 Type 2 diabetes mellitus with other skin ulcer Facility Procedures CPT4 Code: 61224497 Description: 53005 - DEBRIDE W/O ANES NON SELECT Modifier: Quantity: 1 Physician Procedures CPT4 Code: 1102111 Description: 73567 - WC PHYS LEVEL 2 - EST PT ICD-10 Description Diagnosis L89.150 Pressure ulcer of sacral region, unstageable Modifier: Quantity: 1 Electronic Signature(s) Signed: 08/31/2016 7:49:24 AM By: Linton Ham MD Entered By: Linton Ham on 08/30/2016 01:41:03

## 2016-09-06 ENCOUNTER — Encounter: Payer: Medicare Other | Attending: Internal Medicine | Admitting: Internal Medicine

## 2016-09-06 DIAGNOSIS — E11622 Type 2 diabetes mellitus with other skin ulcer: Secondary | ICD-10-CM | POA: Diagnosis present

## 2016-09-06 DIAGNOSIS — S21219D Laceration without foreign body of unspecified back wall of thorax without penetration into thoracic cavity, subsequent encounter: Secondary | ICD-10-CM | POA: Insufficient documentation

## 2016-09-06 DIAGNOSIS — X58XXXD Exposure to other specified factors, subsequent encounter: Secondary | ICD-10-CM | POA: Insufficient documentation

## 2016-09-06 DIAGNOSIS — L89150 Pressure ulcer of sacral region, unstageable: Secondary | ICD-10-CM | POA: Diagnosis not present

## 2016-09-06 DIAGNOSIS — S91011D Laceration without foreign body, right ankle, subsequent encounter: Secondary | ICD-10-CM | POA: Insufficient documentation

## 2016-09-07 NOTE — Progress Notes (Signed)
ALOIS, COLGAN ([id number]) Visit Report for 09/06/2016 HPI Details Patient Name: [name], [name] Date of Service: 09/06/2016 2:30 PM Medical Record Patient Account Number: [id number] Number: Treating RN: Montey Hora 09-05-1940 (75 y.o. Other Clinician: Date of Birth/Sex: Male) Treating ROBSON, MICHAEL Primary Care Provider: Johny Drilling Provider/Extender: G Referring Provider: Nettie Elm in Treatment: 10 History of Present Illness HPI Description: 06/28/16; this is a 76 year old man who was admitted to hospital from 6/13 through 06/18/16 at [facility] after being found down in a closet in his home. It is not exactly clear how long he was actually there although it was probably for days. His total CK maximum was at 2582. He was admitted with delirium, possibly heat related illness, possible subendocardial MI. An MRI of the brain was negative. During this hospitalization he was noted to have areas of pressure-related injury for the time he was spent on the floor on his back he was stated to have a stage II pressure ulcer over his buttocks. Multiple excoriations were also noted. He has advanced home care about doing physical therapy there requesting a nurse which seems reasonable. The patient is a diabetic on metformin. They're using bacitracin to all these areas. He was prescribed doxycycline which I believe he is still on. 07/05/16; patient with pressure ulcers on his right greater than left buttock surrounding his coccyx. Excoriation over the right lateral ankle an excoriation on his upper back. They did not get any Santyl or medihoney. We will take care of this today. 07/12/16 unfortunately patient's wounds appeared to be doing better across the board and the eschar that is overlying the sacral wound has loosened up to where I could debride somewhat today. There's no evidence of 07/20/16; butterfly shaped wound across the sacrum and the surrounding skin and  soft tissue. The area on the right is much larger than the left. The area on the left looks healthy on the right still and necrotic nonviable surface. Using Santyl 07/26/16; butterfly shaped wound across the sacrum and surrounding skin and soft tissue. The area on the right much larger than the left. Extensive debridement on the right last week post-debridement culture showed enterococcus faecalis. This is ampicillin sensitive and he'll definitely need Augmentin. We have been using Santyl.[name] improvement this week 08/02/16; butterfly shaped wound across the sacrum and surrounding skin and soft tissue. The area on the left is just about closed the area on the right has settled into a stage III wound initially unstageable on arrival. This will slow bleed ongoing debridement both mechanically and enzymatically although we are making good progress each week. Using Santyl/moist gauze/border foam 08/09/16 on evaluation today patient presents with a slough covered sacral wound but he tells me he experiences some mild discomfort regarding. This pain seems to be worse mainly because cleansing and he rates this to be a 2 out of 10. With that being said he does seem to be unfortunately after last week's debridement she states that he had a snack a lot of bleeding upon arrival at home making some progress with the Santyl at this point. His daughter actually does the dressing changes for him daily. Wish they had not noted up until that point. Otherwise things have been going well in regard to treatment. Patient has no Bertelson, Traevon K. ([id number]) nausea, vomiting, or diarrhea as well as no fever or chills. 08/16/16 on evaluation today patient appears to be doing well in regard to his sacral ulcer. He is the dressing  changes. The only concern that has been made and brought up by his daughter at this point is that the dressings are being sent from the supply company only have a 2 x 2 pad and the wound is larger  than 2 x 2 in dimension. She therefore seven difficulty getting the dressing to seal appropriately without leaking or causing other issues. Obviously this has been frustrating. Nonetheless otherwise his wounds seems to be doing well. No fevers, chills, nausea, or vomiting noted at this time. 08/23/16; the patient continues with Santyl as the primary dressing and in the 3 weeks since I have seen this he has made remarkable improvement. His daughter is changing the dressing. 08/30/16; patient continues to make good improvement with Santyl. Wound is smaller. This is on the right buttock side of what was originally a butterfly shaped wound. 09/06/16; patient continues to make improvement in wound dimensions using Santyl. Electronic Signature(s) Signed: 09/07/2016 4:30:21 AM By: Linton Ham MD Entered By: Linton Ham on 09/06/2016 17:46:17 [name], [name] ([id number]) -------------------------------------------------------------------------------- Physical Exam Details Patient Name: [name] Date of Service: 09/06/2016 2:30 PM Medical Record Patient Account Number: [id number] Number: Treating RN: Montey Hora 05-12-1940 (75 y.o. Other Clinician: Date of Birth/Sex: Male) Treating ROBSON, MICHAEL Primary Care Provider: Johny Drilling Provider/Extender: G Referring Provider: Nettie Elm in Treatment: 10 Constitutional Sitting or standing Blood Pressure is within target range for patient.. Pulse regular and within target range for patient.Marland Kitchen Respirations regular, non-labored and within target range.. Temperature is normal and within the target range for the patient.Marland Kitchen appears in no distress. Notes Wound exam; the original large butterfly pressure area is now down to a small wound on the right. Still a stage III wound up with a healthy base. Dimensions continue to improve. No evidence of surrounding soft tissue infection. Electronic Signature(s) Signed: 09/07/2016  4:30:21 AM By: Linton Ham MD Entered By: Linton Ham on 09/06/2016 17:47:45 [name], [name] ([id number]) -------------------------------------------------------------------------------- Physician Orders Details Patient Name: [name] Date of Service: 09/06/2016 2:30 PM Medical Record Patient Account Number: [id number] Number: Treating RN: Cornell Barman 10-02-1940 (75 y.o. Other Clinician: Date of Birth/Sex: Male) Treating ROBSON, MICHAEL Primary Care Provider: Johny Drilling Provider/Extender: G Referring Provider: Nettie Elm in Treatment: 10 Verbal / Phone Orders: No Diagnosis Coding Wound Cleansing Wound #1 Right Sacrum o Cleanse wound with mild soap and water Primary Wound Dressing Wound #1 Right Sacrum o Santyl Ointment Secondary Dressing Wound #1 Right Sacrum o Boardered Foam Dressing Dressing Change Frequency Wound #1 Right Sacrum o Change dressing every day. Follow-up Appointments Wound #1 Right Sacrum o Return Appointment in 1 week. Off-Loading Wound #1 Right Sacrum o Turn and reposition every 2 hours Electronic Signature(s) Signed: 09/06/2016 5:20:16 PM By: Gretta Cool, BSN, RN, CWS, Kim RN, BSN Signed: 09/07/2016 4:30:21 AM By: Linton Ham MD Entered By: Gretta Cool, BSN, RN, CWS, Kim on 09/06/2016 16:50:22 Sayner, [name] ([id number]) -------------------------------------------------------------------------------- Problem List Details Patient Name: [name] Date of Service: 09/06/2016 2:30 PM Medical Record Patient Account Number: [id number] Number: Treating RN: Montey Hora [birth year] (75 y.o. Other Clinician: Date of Birth/Sex: Male) Treating ROBSON, MICHAEL Primary Care Provider: Johny Drilling Provider/Extender: G Referring Provider: Nettie Elm in Treatment: 10 Active Problems ICD-10 Encounter Code Description Active Date Diagnosis L89.150 Pressure ulcer of sacral region,  unstageable 06/28/2016 Yes S91.011D Laceration without foreign body, right ankle, subsequent 06/28/2016 Yes encounter S21.219D Laceration without foreign body of unspecified back wall 06/28/2016 Yes of thorax without  penetration into thoracic cavity, subsequent encounter E11.622 Type 2 diabetes mellitus with other skin ulcer 06/28/2016 Yes Inactive Problems Resolved Problems Electronic Signature(s) Signed: 09/07/2016 4:30:21 AM By: Linton Ham MD Entered By: Linton Ham on 09/06/2016 17:44:41 [name], [name] ([id number]) -------------------------------------------------------------------------------- Progress Note Details Patient Name: [name] Date of Service: 09/06/2016 2:30 PM Medical Record Patient Account Number: [id number] Number: Treating RN: Montey Hora [birth year] (75 y.o. Other Clinician: Date of Birth/Sex: Male) Treating ROBSON, MICHAEL Primary Care Provider: Johny Drilling Provider/Extender: G Referring Provider: Nettie Elm in Treatment: 10 Subjective History of Present Illness (HPI) 06/28/16; this is a 76 year old man who was admitted to hospital from 6/13 through 06/18/16 at [facility] after being found down in a closet in his home. It is not exactly clear how long he was actually there although it was probably for days. His total CK maximum was at 2582. He was admitted with delirium, possibly heat related illness, possible subendocardial MI. An MRI of the brain was negative. During this hospitalization he was noted to have areas of pressure-related injury for the time he was spent on the floor on his back he was stated to have a stage II pressure ulcer over his buttocks. Multiple excoriations were also noted. He has advanced home care about doing physical therapy there requesting a nurse which seems reasonable. The patient is a diabetic on metformin. They're using bacitracin to all these areas. He was prescribed doxycycline which  I believe he is still on. 07/05/16; patient with pressure ulcers on his right greater than left buttock surrounding his coccyx. Excoriation over the right lateral ankle an excoriation on his upper back. They did not get any Santyl or medihoney. We will take care of this today. 07/12/16 unfortunately patient's wounds appeared to be doing better across the board and the eschar that is overlying the sacral wound has loosened up to where I could debride somewhat today. There's no evidence of 07/20/16; butterfly shaped wound across the sacrum and the surrounding skin and soft tissue. The area on the right is much larger than the left. The area on the left looks healthy on the right still and necrotic nonviable surface. Using Santyl 07/26/16; butterfly shaped wound across the sacrum and surrounding skin and soft tissue. The area on the right much larger than the left. Extensive debridement on the right last week post-debridement culture showed enterococcus faecalis. This is ampicillin sensitive and he'll definitely need Augmentin. We have been using Santyl.[name] improvement this week 08/02/16; butterfly shaped wound across the sacrum and surrounding skin and soft tissue. The area on the left is just about closed the area on the right has settled into a stage III wound initially unstageable on arrival. This will slow bleed ongoing debridement both mechanically and enzymatically although we are making good progress each week. Using Santyl/moist gauze/border foam 08/09/16 on evaluation today patient presents with a slough covered sacral wound but he tells me he experiences some mild discomfort regarding. This pain seems to be worse mainly because cleansing and he rates this to be a 2 out of 10. With that being said he does seem to be unfortunately after last week's debridement she states that he had a snack a lot of bleeding upon arrival at home making some progress with the Santyl at this point. His daughter  actually does the dressing changes for him daily. Wish they had not noted up until that point. Otherwise things have been going well in regard to treatment. Patient  has no nausea, vomiting, or diarrhea as well as no fever or chills. 08/16/16 on evaluation today patient appears to be doing well in regard to his sacral ulcer. He is the dressing [name], Charles K. ([id number]) changes. The only concern that has been made and brought up by his daughter at this point is that the dressings are being sent from the supply company only have a 2 x 2 pad and the wound is larger than 2 x 2 in dimension. She therefore seven difficulty getting the dressing to seal appropriately without leaking or causing other issues. Obviously this has been frustrating. Nonetheless otherwise his wounds seems to be doing well. No fevers, chills, nausea, or vomiting noted at this time. 08/23/16; the patient continues with Santyl as the primary dressing and in the 3 weeks since I have seen this he has made remarkable improvement. His daughter is changing the dressing. 08/30/16; patient continues to make good improvement with Santyl. Wound is smaller. This is on the right buttock side of what was originally a butterfly shaped wound. 09/06/16; patient continues to make improvement in wound dimensions using Santyl. Objective Constitutional Sitting or standing Blood Pressure is within target range for patient.. Pulse regular and within target range for patient.Marland Kitchen Respirations regular, non-labored and within target range.. Temperature is normal and within the target range for the patient.Marland Kitchen appears in no distress. Vitals Time Taken: 2:47 PM, Height: 69 in, Weight: 147.5 lbs, BMI: 21.8, Temperature: 97.5 F, Pulse: 62 bpm, Respiratory Rate: 16 breaths/min, Blood Pressure: 100/57 mmHg. General Notes: Wound exam; the original large butterfly pressure area is now down to a small wound on the right. Still a stage III wound up with a healthy  base. Dimensions continue to improve. No evidence of surrounding soft tissue infection. Integumentary (Hair, Skin) Wound #1 status is Open. Original cause of wound was Pressure Injury. The wound is located on the Right Sacrum. The wound measures 1.7cm length x 1.2cm width x 0.7cm depth; 1.602cm^2 area and 1.122cm^3 volume. There is muscle and Fat Layer (Subcutaneous Tissue) Exposed exposed. There is no tunneling or undermining noted. There is a large amount of serous drainage noted. The wound margin is distinct with the outline attached to the wound base. There is large (67-100%) red granulation within the wound bed. There is a small (1-33%) amount of necrotic tissue within the wound bed including Adherent Slough and Necrosis of Muscle. The periwound skin appearance exhibited: Induration, Dry/Scaly, Rubor, Erythema. The surrounding wound skin color is noted with erythema which is circumferential. Periwound temperature was noted as No Abnormality. The periwound has tenderness on palpation. [name], [name] ([id number]) Assessment Active Problems ICD-10 L89.150 - Pressure ulcer of sacral region, unstageable S91.011D - Laceration without foreign body, right ankle, subsequent encounter S21.219D - Laceration without foreign body of unspecified back wall of thorax without penetration into thoracic cavity, subsequent encounter E11.622 - Type 2 diabetes mellitus with other skin ulcer Plan Wound Cleansing: Wound #1 Right Sacrum: Cleanse wound with mild soap and water Primary Wound Dressing: Wound #1 Right Sacrum: Santyl Ointment Secondary Dressing: Wound #1 Right Sacrum: Boardered Foam Dressing Dressing Change Frequency: Wound #1 Right Sacrum: Change dressing every day. Follow-up Appointments: Wound #1 Right Sacrum: Return Appointment in 1 week. Off-Loading: Wound #1 Right Sacrum: Turn and reposition every 2 hours -I'm going to continue santyl ointment. As long as this wound continues  to get smaller, it is reasonable to continue santyl [name], Charles K. ([id number]) Electronic Signature(s) Signed: 09/07/2016 4:30:21 AM By: Dellia Nims,  Legrand Como MD Entered By: Linton Ham on 09/06/2016 17:51:03 [name], [name] ([id number]) -------------------------------------------------------------------------------- SuperBill Details Patient Name: [name] Date of Service: 09/06/2016 Medical Record Patient Account Number: [id number] Number: Treating RN: Montey Hora [birth year] (75 y.o. Other Clinician: Date of Birth/Sex: Male) Treating ROBSON, MICHAEL Primary Care Provider: Johny Drilling Provider/Extender: G Referring Provider: Nettie Elm in Treatment: 10 Diagnosis Coding ICD-10 Codes Code Description L89.150 Pressure ulcer of sacral region, unstageable S91.011D Laceration without foreign body, right ankle, subsequent encounter Laceration without foreign body of unspecified back wall of thorax without penetration into S21.219D thoracic cavity, subsequent encounter E11.622 Type 2 diabetes mellitus with other skin ulcer Facility Procedures CPT4 Code: [id number] Description: [phone] - WOUND CARE VISIT-LEV 2 EST PT Modifier: Quantity: 1 Physician Procedures CPT4 Code: [id number] Description: 27078 - WC PHYS LEVEL 2 - EST PT ICD-10 Description Diagnosis L89.150 Pressure ulcer of sacral region, unstageable Modifier: Quantity: 1 Electronic Signature(s) Signed: 09/06/2016 9:51:20 PM By: Gretta Cool, BSN, RN, CWS, Kim RN, BSN Signed: 09/07/2016 4:30:21 AM By: Linton Ham MD Entered By: Gretta Cool, BSN, RN, CWS, Kim on 09/06/2016 21:37:04

## 2016-09-07 NOTE — Progress Notes (Signed)
ACIE, CUSTIS ([id number]) Visit Report for 09/06/2016 Arrival Information Details Patient Name: [name], [name] Date of Service: 09/06/2016 2:30 PM Medical Record Number: [id number] Patient Account Number: [id number] Date of Birth/Sex: Jan 25, 1940 (76 y.o. Male) Treating RN: [name] Primary Care [name]: [name] Other Clinician: Referring [name]: [name] Treating [name]/Extender: [name] in Treatment: 10 Visit Information History Since Last Visit All ordered tests and consults were completed: No Patient Arrived: [name] Added or deleted any medications: No Arrival Time: 14:44 Any new allergies or adverse reactions: No Accompanied By: self Had a fall or experienced change in No Transfer Assistance: None activities of daily living that may affect Patient Identification Verified: Yes risk of falls: Secondary Verification Process Completed: Yes Signs or symptoms of abuse/neglect since last No Patient Requires Transmission-Based No visito Precautions: Hospitalized since last visit: No Patient Has Alerts: Yes Has Dressing in Place as Prescribed: Yes Patient Alerts: DM II Pain Present Now: No Electronic Signature(s) Signed: 09/06/2016 5:27:59 PM By: [name] Entered By: [name] on 09/06/2016 14:45:15 [place], [name] ([id number]) -------------------------------------------------------------------------------- Clinic Level of Care Assessment Details Patient Name: [name] Date of Service: 09/06/2016 2:30 PM Medical Record Number: [id number] Patient Account Number: [id number] Date of Birth/Sex: [birth year] (76 y.o. Male) Treating RN: [name] Primary Care [name]: [name] Other Clinician: Referring Keylen Uzelac: [name] Treating [name]/Extender: [name] in Treatment: 10 Clinic Level of Care Assessment Items TOOL 4 Quantity Score []  - Use when only an EandM is performed on FOLLOW-UP visit  0 ASSESSMENTS - Nursing Assessment / Reassessment []  - Reassessment of Co-morbidities (includes updates in patient status) 0 X - Reassessment of Adherence to Treatment Plan 1 5 ASSESSMENTS - Wound and Skin Assessment / Reassessment X - Simple Wound Assessment / Reassessment - one wound 1 5 []  - Complex Wound Assessment / Reassessment - multiple wounds 0 []  - Dermatologic / Skin Assessment (not related to wound area) 0 ASSESSMENTS - Focused Assessment []  - Circumferential Edema Measurements - multi extremities 0 []  - Nutritional Assessment / Counseling / Intervention 0 []  - Lower Extremity Assessment (monofilament, tuning fork, pulses) 0 []  - Peripheral Arterial Disease Assessment (using hand held doppler) 0 ASSESSMENTS - Ostomy and/or Continence Assessment and Care []  - Incontinence Assessment and Management 0 []  - Ostomy Care Assessment and Management (repouching, etc.) 0 PROCESS - Coordination of Care X - Simple Patient / Family Education for ongoing care 1 15 []  - Complex (extensive) Patient / Family Education for ongoing care 0 []  - Staff obtains Programmer, systems, Records, Test Results / Process Orders 0 []  - Staff telephones HHA, Nursing Homes / Clarify orders / etc 0 []  - Routine Transfer to another Facility (non-emergent condition) 0 [name], Charles K. ([id number]) []  - Routine Hospital Admission (non-emergent condition) 0 []  - New Admissions / Biomedical engineer / Ordering NPWT, Apligraf, etc. 0 []  - Emergency Hospital Admission (emergent condition) 0 X - Simple Discharge Coordination 1 10 []  - Complex (extensive) Discharge Coordination 0 PROCESS - Special Needs []  - Pediatric / Minor Patient Management 0 []  - Isolation Patient Management 0 []  - Hearing / Language / Visual special needs 0 []  - Assessment of Community assistance (transportation, D/C planning, etc.) 0 []  - Additional assistance / Altered mentation 0 []  - Support Surface(s) Assessment (bed, cushion, seat, etc.)  0 INTERVENTIONS - Wound Cleansing / Measurement X - Simple Wound Cleansing - one wound 1 5 []  - Complex Wound Cleansing - multiple wounds 0 X -  Wound Imaging (photographs - any number of wounds) 1 5 []  - Wound Tracing (instead of photographs) 0 []  - Simple Wound Measurement - one wound 0 X - Complex Wound Measurement - multiple wounds 1 5 INTERVENTIONS - Wound Dressings []  - Small Wound Dressing one or multiple wounds 0 X - Medium Wound Dressing one or multiple wounds 1 15 []  - Large Wound Dressing one or multiple wounds 0 []  - Application of Medications - topical 0 []  - Application of Medications - injection 0 INTERVENTIONS - Miscellaneous []  - External ear exam 0 Fero, Charles K. ([id number]) []  - Specimen Collection (cultures, biopsies, blood, body fluids, etc.) 0 []  - Specimen(s) / Culture(s) sent or taken to Lab for analysis 0 []  - Patient Transfer (multiple staff / Harrel Lemon Lift / Similar devices) 0 []  - Simple Staple / Suture removal (25 or less) 0 []  - Complex Staple / Suture removal (26 or more) 0 []  - Hypo / Hyperglycemic Management (close monitor of Blood Glucose) 0 []  - Ankle / Brachial Index (ABI) - do not check if billed separately 0 X - Vital Signs 1 5 Has the patient been seen at the hospital within the last three years: Yes Total Score: 70 Level Of Care: New/Established - Level 2 Electronic Signature(s) Signed: 09/06/2016 9:51:20 PM By: Gretta Cool, BSN, RN, CWS, Kim RN, BSN Entered By: Gretta Cool, BSN, RN, CWS, Kim on 09/06/2016 21:36:54 [place], [name] ([id number]) -------------------------------------------------------------------------------- Encounter Discharge Information Details Patient Name: [name] Date of Service: 09/06/2016 2:30 PM Medical Record Number: [id number] Patient Account Number: [id number] Date of Birth/Sex: Jul 08, 1940 (76 y.o. Male) Treating RN: [name] Primary Care Avrey Flanagin: [name] Other Clinician: Referring Lorrinda Ramstad: [name] Treating Wisdom Rickey/Extender: [name] in Treatment: 10 Encounter Discharge Information Items Discharge Pain Level: 0 Discharge Condition: Stable Ambulatory Status: Cane Discharge Destination: Home Private Transportation: Auto Accompanied By: self Schedule Follow-up Appointment: Yes Medication Reconciliation completed and Yes provided to Patient/Care Kalaysia Demonbreun: Clinical Summary of Care: Electronic Signature(s) Signed: 09/06/2016 9:51:20 PM By: Gretta Cool, BSN, RN, CWS, Kim RN, BSN Entered By: Gretta Cool, BSN, RN, CWS, Kim on 09/06/2016 21:38:05 [place], [name] ([id number]) -------------------------------------------------------------------------------- Lower Extremity Assessment Details Patient Name: [name] Date of Service: 09/06/2016 2:30 PM Medical Record Number: [id number] Patient Account Number: [id number] Date of Birth/Sex: [birth year] (75 y.o. Male) Treating RN: [name] Primary Care Jaystin Mcgarvey: [name] Other Clinician: Referring Umaiza Matusik: [name] Treating Luma Clopper/Extender: Ricard Dillon Weeks in Treatment: 10 Electronic Signature(s) Signed: 09/06/2016 5:27:59 PM By: [name] Entered By: [name] on 09/06/2016 14:54:43 [place], [name] ([id number]) -------------------------------------------------------------------------------- Multi Wound Chart Details Patient Name: [name] Date of Service: 09/06/2016 2:30 PM Medical Record Number: [id number] Patient Account Number: [id number] Date of Birth/Sex: 1940/11/11 (76 y.o. Male) Treating RN: [name] Primary Care Kameran Mcneese: [name] Other Clinician: Referring Faysal Fenoglio: [name] Treating Lynley Killilea/Extender: Ricard Dillon Weeks in Treatment: 10 Vital Signs Height(in): 69 Pulse(bpm): 62 Weight(lbs): 147.5 Blood Pressure 100/57 (mmHg): Body Mass Index(BMI): 22 Temperature(F): 97.5 Respiratory Rate 16 (breaths/min): Photos: [N/A:N/A] Wound  Location: Right Sacrum N/A N/A Wounding Event: Pressure Injury N/A N/A Primary Etiology: Pressure Ulcer N/A N/A Date Acquired: 06/14/2016 N/A N/A Weeks of Treatment: 10 N/A N/A Wound Status: Open N/A N/A Measurements L x W x D 1.7x1.2x0.7 N/A N/A (cm) Area (cm) : 1.602 N/A N/A Volume (cm) : 1.122 N/A N/A % Reduction in Area: 95.80% N/A N/A % Reduction in Volume: 85.20% N/A N/A Classification: Category/Stage IV N/A N/A Exudate  Amount: Large N/A N/A Exudate Type: Serous N/A N/A Exudate Color: amber N/A N/A Wound Margin: Distinct, outline attached N/A N/A Granulation Amount: Large (67-100%) N/A N/A Granulation Quality: Red N/A N/A Necrotic Amount: Small (1-33%) N/A N/A Exposed Structures: Fat Layer (Subcutaneous N/A N/A Tissue) Exposed: Yes Muscle: Yes Brett, Eliud K. ([id number]) Fascia: No Tendon: No Joint: No Bone: No Epithelialization: Small (1-33%) N/A N/A Periwound Skin Texture: Induration: Yes N/A N/A Periwound Skin Dry/Scaly: Yes N/A N/A Moisture: Periwound Skin Color: Erythema: Yes N/A N/A Rubor: Yes Erythema Location: Circumferential N/A N/A Temperature: No Abnormality N/A N/A Tenderness on Yes N/A N/A Palpation: Wound Preparation: Ulcer Cleansing: N/A N/A Rinsed/Irrigated with Saline Topical Anesthetic Applied: Other: lidocaine 4% Treatment Notes Electronic Signature(s) Signed: 09/07/2016 4:30:21 AM By: Linton Ham MD Previous Signature: 09/06/2016 5:20:16 PM Version By: Gretta Cool, BSN, RN, CWS, Kim RN, BSN Entered By: Linton Ham on 09/06/2016 17:44:48 [place], [name] ([id number]) -------------------------------------------------------------------------------- [place] Details Patient Name: [name] Date of Service: 09/06/2016 2:30 PM Medical Record Number: [id number] Patient Account Number: [id number] Date of Birth/Sex: [birth year] (76 y.o. Male) Treating RN: [name] Primary Care Sidnee Gambrill: [name] Other  Clinician: Referring Johnte Portnoy: [name] Treating Oswald Pott/Extender: [name] in Treatment: 10 Active Inactive ` Abuse / Safety / Falls / Self Care Management Nursing Diagnoses: Potential for falls Goals: Patient will not experience any injury related to falls Date Initiated: 06/28/2016 Target Resolution Date: 10/08/2016 Goal Status: Active Interventions: Assess Activities of Daily Living upon admission and as needed Assess: immobility, friction, shearing, incontinence upon admission and as needed Assess impairment of mobility on admission and as needed per policy Notes: ` Nutrition Nursing Diagnoses: Imbalanced nutrition Impaired glucose control: actual or potential Potential for alteratiion in Nutrition/Potential for imbalanced nutrition Goals: Patient/caregiver agrees to and verbalizes understanding of need to use nutritional supplements and/or vitamins as prescribed Date Initiated: 06/28/2016 Target Resolution Date: 09/10/2016 Goal Status: Active Patient/caregiver verbalizes understanding of need to maintain therapeutic glucose control per primary care physician Date Initiated: 06/28/2016 Target Resolution Date: 09/10/2016 Goal Status: Active Interventions: MCADAMSAntaeus, Karel ([id number]) Assess patient nutrition upon admission and as needed per policy Notes: ` Orientation to the Wound Care Program Nursing Diagnoses: Knowledge deficit related to the wound healing center program Goals: Patient/caregiver will verbalize understanding of the [place] Date Initiated: 06/28/2016 Target Resolution Date: 07/09/2016 Goal Status: Active Interventions: Provide education on orientation to the wound center Notes: ` Pain, Acute or Chronic Nursing Diagnoses: Pain, acute or chronic: actual or potential Potential alteration in comfort, pain Goals: Patient/caregiver will verbalize adequate pain control between visits Date Initiated:  06/28/2016 Target Resolution Date: 10/08/2016 Goal Status: Active Interventions: Assess comfort goal upon admission Complete pain assessment as per visit requirements Notes: ` Pressure Nursing Diagnoses: Knowledge deficit related to causes and risk factors for pressure ulcer development Knowledge deficit related to management of pressures ulcers Potential for impaired tissue integrity related to pressure, friction, moisture, and shear Goals: Patient will remain free from development of additional pressure ulcers Gillispie, MAURICE FOTHERINGHAM ([id number]) Date Initiated: 06/28/2016 Target Resolution Date: 10/08/2016 Goal Status: Active Interventions: Assess: immobility, friction, shearing, incontinence upon admission and as needed Assess offloading mechanisms upon admission and as needed Notes: ` Wound/Skin Impairment Nursing Diagnoses: Impaired tissue integrity Knowledge deficit related to ulceration/compromised skin integrity Goals: Ulcer/skin breakdown will have a volume reduction of 80% by week 12 Date Initiated: 06/28/2016 Target Resolution Date: 10/01/2016 Goal Status: Active Interventions: Assess patient/caregiver ability to perform ulcer/skin care  regimen upon admission and as needed Assess ulceration(s) every visit Notes: Electronic Signature(s) Signed: 09/06/2016 9:51:20 PM By: Gretta Cool, BSN, RN, CWS, Kim RN, BSN Previous Signature: 09/06/2016 5:20:16 PM Version By: Gretta Cool, BSN, RN, CWS, Kim RN, BSN Entered By: Gretta Cool, BSN, RN, CWS, Kim on 09/06/2016 21:36:19 [place]yn[place], [name] ([id number]) -------------------------------------------------------------------------------- Pain Assessment Details Patient Name: [name] Date of Service: 09/06/2016 2:30 PM Medical Record Number: [id number] Patient Account Number: [id number] Date of Birth/Sex: 1940-03-29 (76 y.o. Male) Treating RN: [name] Primary Care Loraina Stauffer: [name] Other Clinician: Referring Tyquasia Pant: [name] Treating Johnetta Sloniker/Extender: Ricard Dillon Weeks in Treatment: 10 Active Problems Location of Pain Severity and Description of Pain Patient Has Paino No Site Locations Pain Management and Medication Current Pain Management: Electronic Signature(s) Signed: 09/06/2016 5:27:59 PM By: [name] Entered By: [name] on 09/06/2016 14:46:44 [place], [name] ([id number]) -------------------------------------------------------------------------------- Patient/Caregiver Education Details Patient Name: [name] Date of Service: 09/06/2016 2:30 PM Medical Record Patient Account Number: [id number] Number: Treating RN: [name] 05-Dec-1940 (75 y.o. Other Clinician: Date of Birth/Gender: Male) Treating ROBSON, [place] Primary Care Physician: [name] Physician/Extender: G Referring Physician: Nettie Elm in Treatment: 10 Education Assessment Education Provided To: Patient Education Topics Provided Wound/Skin Impairment: Handouts: Caring for Your Ulcer, Other: continue wound care as prescribed Methods: Demonstration, Explain/Verbal Responses: State content correctly Electronic Signature(s) Signed: 09/06/2016 9:51:20 PM By: Gretta Cool, BSN, RN, CWS, Kim RN, BSN Entered By: Gretta Cool, BSN, RN, CWS, Kim on 09/06/2016 21:38:30 [place], [name] ([id number]) -------------------------------------------------------------------------------- Wound Assessment Details Patient Name: [name] Date of Service: 09/06/2016 2:30 PM Medical Record Number: [id number] Patient Account Number: [id number] Date of Birth/Sex: 09-25-1940 (76 y.o. Male) Treating RN: [name] Primary Care Lylia Karn: [name] Other Clinician: Referring Claudette Wermuth: [name] Treating Lambros Cerro/Extender: Ricard Dillon Weeks in Treatment: 10 Wound Status Wound Number: 1 Primary Etiology: Pressure Ulcer Wound Location: Right Sacrum Wound Status: Open Wounding  Event: Pressure Injury Date Acquired: 06/14/2016 Weeks Of Treatment: 10 Clustered Wound: No Photos Photo Uploaded By: [name] on 09/06/2016 17:15:42 Wound Measurements Length: (cm) 1.7 Width: (cm) 1.2 Depth: (cm) 0.7 Area: (cm) 1.602 Volume: (cm) 1.122 % Reduction in Area: 95.8% % Reduction in Volume: 85.2% Epithelialization: Small (1-33%) Tunneling: No Undermining: No Wound Description Classification: Category/Stage IV Wound Margin: Distinct, outline attached Exudate Amount: Large Exudate Type: Serous Exudate Color: amber Foul Odor After Cleansing: No Slough/Fibrino Yes Wound Bed Granulation Amount: Large (67-100%) Exposed Structure Granulation Quality: Red Fascia Exposed: No Necrotic Amount: Small (1-33%) Fat Layer (Subcutaneous Tissue) Exposed: Yes Necrotic Quality: Adherent Slough Tendon Exposed: No Lentsch, Jaykwon K. ([id number]) Muscle Exposed: Yes Necrosis of Muscle: Yes Joint Exposed: No Bone Exposed: No Periwound Skin Texture Texture Color No Abnormalities Noted: No No Abnormalities Noted: No Induration: Yes Erythema: Yes Erythema Location: Circumferential Moisture Rubor: Yes No Abnormalities Noted: No Dry / Scaly: Yes Temperature / Pain Temperature: No Abnormality Tenderness on Palpation: Yes Wound Preparation Ulcer Cleansing: Rinsed/Irrigated with Saline Topical Anesthetic Applied: Other: lidocaine 4%, Treatment Notes Wound #1 (Right Sacrum) 1. Cleansed with: Clean wound with Normal Saline 2. Anesthetic Topical Lidocaine 4% cream to wound bed prior to debridement 4. Dressing Applied: Santyl Ointment 5. Secondary Dressing Applied Bordered Foam Dressing Electronic Signature(s) Signed: 09/06/2016 5:27:59 PM By: [name] Entered By: [name] on 09/06/2016 14:53:27 [place], [name] ([id number]) -------------------------------------------------------------------------------- Vitals Details Patient Name: [name] Date of Service: 09/06/2016 2:30 PM Medical Record Number: [id number] Patient Account Number: [id number] Date  of Birth/Sex: 04-Jul-1940 (76 y.o. Male) Treating RN: Carolyne Fiscal, Debi Primary Care Darrek Leasure: [name] Other Clinician: Referring Evalyse Stroope: [name] Treating [place] Jakub[place]/Extender: [name] in Treatment: 10 Vital Signs Time Taken: 14:47 Temperature (F): 97.5 Height (in): 69 Pulse (bpm): 62 Weight (lbs): 147.5 Respiratory Rate (breaths/min): 16 Body Mass Index (BMI): 21.8 Blood Pressure (mmHg): 100/57 Reference Range: 80 - 120 mg / dl Electronic Signature(s) Signed: 09/06/2016 5:27:59 PM By: [name] Entered By: [name] on 09/06/2016 14:47:28

## 2016-09-13 ENCOUNTER — Encounter: Payer: Medicare Other | Admitting: Internal Medicine

## 2016-09-13 DIAGNOSIS — E11622 Type 2 diabetes mellitus with other skin ulcer: Secondary | ICD-10-CM | POA: Diagnosis not present

## 2016-09-14 NOTE — Progress Notes (Addendum)
KAELUM, KISSICK ([id number]) Visit Report for 09/13/2016 Debridement Details Patient Name: [name], [name] Date of Service: 09/13/2016 2:15 PM Medical Record Patient Account Number: [id number] Number: Treating RN: Cornell Barman 30-Nov-1940 (75 y.o. Other Clinician: Date of Birth/Sex: Male) Treating Jarrett Albor Primary Care Provider: Johny Drilling Provider/Extender: G Referring Provider: Nettie Elm in Treatment: 11 Debridement Performed for Wound #1 Right Sacrum Assessment: Performed By: Physician Ricard Dillon, MD Debridement: Chemical/enzymatic Debridement Non-Selective Description: Pre-procedure Verification/Time Out Yes - 14:35 Taken: Start Time: 14:35 End Time: 14:36 Procedural Pain: 0 Post Procedural Pain: 0 Response to Treatment: Procedure was tolerated well Post Debridement Measurements of Total Wound Length: (cm) 1.1 Stage: Category/Stage IV Width: (cm) 0.8 Depth: (cm) 0.2 Volume: (cm) 0.138 Character of Wound/Ulcer Post Stable Debridement: Post Procedure Diagnosis Same as Pre-procedure Electronic Signature(s) Signed: 09/15/2016 8:32:40 AM By: Gretta Cool, BSN, RN, CWS, Kim RN, BSN Signed: 09/20/2016 4:14:46 PM By: Linton Ham MD Entered By: Gretta Cool, BSN, RN, CWS, Kim on 09/14/2016 17:04:46 [place], [name] ([id number]) -------------------------------------------------------------------------------- HPI Details Patient Name: [name] Date of Service: 09/13/2016 2:15 PM Medical Record Patient Account Number: [id number] Number: Treating RN: Montey Hora 1940-02-03 (75 y.o. Other Clinician: Date of Birth/Sex: Male) Treating Al Gagen Primary Care Provider: Johny Drilling Provider/Extender: G Referring Provider: Nettie Elm in Treatment: 11 History of Present Illness HPI Description: 06/28/16; this is a 76 year old man who was admitted to hospital from 6/13 through 06/18/16 at [facility] after  being found down in a closet in his home. It is not exactly clear how long he was actually there although it was probably for days. His total CK maximum was at 2582. He was admitted with delirium, possibly heat related illness, possible subendocardial MI. An MRI of the brain was negative. During this hospitalization he was noted to have areas of pressure-related injury for the time he was spent on the floor on his back he was stated to have a stage II pressure ulcer over his buttocks. Multiple excoriations were also noted. He has advanced home care about doing physical therapy there requesting a nurse which seems reasonable. The patient is a diabetic on metformin. They're using bacitracin to all these areas. He was prescribed doxycycline which I believe he is still on. 07/05/16; patient with pressure ulcers on his right greater than left buttock surrounding his coccyx. Excoriation over the right lateral ankle an excoriation on his upper back. They did not get any Santyl or medihoney. We will take care of this today. 07/12/16 unfortunately patient's wounds appeared to be doing better across the board and the eschar that is overlying the sacral wound has loosened up to where I could debride somewhat today. There's no evidence of 07/20/16; butterfly shaped wound across the sacrum and the surrounding skin and soft tissue. The area on the right is much larger than the left. The area on the left looks healthy on the right still and necrotic nonviable surface. Using Santyl 07/26/16; butterfly shaped wound across the sacrum and surrounding skin and soft tissue. The area on the right much larger than the left. Extensive debridement on the right last week post-debridement culture showed enterococcus faecalis. This is ampicillin sensitive and he'll definitely need Augmentin. We have been using Santyl.[name] improvement this week 08/02/16; butterfly shaped wound across the sacrum and surrounding skin and soft  tissue. The area on the left is just about closed the area on the right has settled into a stage III wound initially unstageable on  arrival. This will slow bleed ongoing debridement both mechanically and enzymatically although we are making good progress each week. Using Santyl/moist gauze/border foam 08/09/16 on evaluation today patient presents with a slough covered sacral wound but he tells me he experiences some mild discomfort regarding. This pain seems to be worse mainly because cleansing and he rates this to be a 2 out of 10. With that being said he does seem to be unfortunately after last week's debridement she states that he had a snack a lot of bleeding upon arrival at home making some progress with the Santyl at this point. His daughter actually does the dressing changes for him daily. Wish they had not noted up until that point. Otherwise things have been going well in regard to treatment. Patient has no nausea, vomiting, or diarrhea as well as no fever or chills. 08/16/16 on evaluation today patient appears to be doing well in regard to his sacral ulcer. He is the dressing changes. The only concern that has been made and brought up by his daughter at this point is that the [facility] ([id number]) dressings are being sent from the supply company only have a 2 x 2 pad and the wound is larger than 2 x 2 in dimension. She therefore seven difficulty getting the dressing to seal appropriately without leaking or causing other issues. Obviously this has been frustrating. Nonetheless otherwise his wounds seems to be doing well. No fevers, chills, nausea, or vomiting noted at this time. 08/23/16; the patient continues with Santyl as the primary dressing and in the 3 weeks since I have seen this he has made remarkable improvement. His daughter is changing the dressing. 08/30/16; patient continues to make good improvement with Santyl. Wound is smaller. This is on the right buttock side of what  was originally a butterfly shaped wound. 09/06/16; patient continues to make improvement in wound dimensions using Santyl. 09/13/16; wound dimensions are improved still using santyl as the primary dressing Electronic Signature(s) Signed: 09/13/2016 3:57:41 PM By: Linton Ham MD Entered By: Linton Ham on 09/13/2016 13:55:44 [name], [name] ([id number]) -------------------------------------------------------------------------------- Physical Exam Details Patient Name: [name] Date of Service: 09/13/2016 2:15 PM Medical Record Patient Account Number: [id number] Number: Treating RN: Montey Hora 1940/07/27 (75 y.o. Other Clinician: Date of Birth/Sex: Male) Treating Manav Pierotti Primary Care Provider: Johny Drilling Provider/Extender: G Referring Provider: Nettie Elm in Treatment: 11 Constitutional Sitting or standing Blood Pressure is within target range for patient.. Supine Blood Pressure is within target range for patient.. Pulse regular and within target range for patient.Marland Kitchen Respirations regular, non-labored and within target range.. Notes Wound exam; the original large butterfly pressure area is now down to a smaller area on the right which contracts each time I see it. Still a stage III technically but dimensions continued to improve. There is no evidence of surrounding infection Electronic Signature(s) Signed: 09/13/2016 3:57:41 PM By: Linton Ham MD Entered By: Linton Ham on 09/13/2016 13:56:36 [name], [name] ([id number]) -------------------------------------------------------------------------------- Physician Orders Details Patient Name: [name] Date of Service: 09/13/2016 2:15 PM Medical Record Patient Account Number: [id number] Number: Treating RN: Cornell Barman 04-05-1940 (75 y.o. Other Clinician: Date of Birth/Sex: Male) Treating Marieliz Strang Primary Care Provider: Johny Drilling Provider/Extender:  G Referring Provider: Nettie Elm in Treatment: 11 Verbal / Phone Orders: No Diagnosis Coding ICD-10 Coding Code Description L89.150 Pressure ulcer of sacral region, unstageable S91.011D Laceration without foreign body, right ankle, subsequent encounter Laceration without foreign body of  unspecified back wall of thorax without penetration into S21.219D thoracic cavity, subsequent encounter E11.622 Type 2 diabetes mellitus with other skin ulcer Wound Cleansing Wound #1 Right Sacrum o Cleanse wound with mild soap and water Primary Wound Dressing Wound #1 Right Sacrum o Santyl Ointment Secondary Dressing Wound #1 Right Sacrum o Boardered Foam Dressing Dressing Change Frequency Wound #1 Right Sacrum o Change dressing every day. Follow-up Appointments Wound #1 Right Sacrum o Return Appointment in 1 week. Off-Loading Wound #1 Right Sacrum o Turn and reposition every 2 hours [name], [name] ([id number]) Electronic Signature(s) Signed: 09/13/2016 3:57:41 PM By: Linton Ham MD Signed: 09/13/2016 4:02:39 PM By: Gretta Cool, BSN, RN, CWS, Kim RN, BSN Entered By: Gretta Cool, BSN, RN, CWS, Kim on 09/13/2016 13:54:39 [name], [name] ([id number]) -------------------------------------------------------------------------------- Problem List Details Patient Name: [name] Date of Service: 09/13/2016 2:15 PM Medical Record Patient Account Number: [id number] Number: Treating RN: Montey Hora Apr 06, 1940 (75 y.o. Other Clinician: Date of Birth/Sex: Male) Treating Laverle Pillard Primary Care Provider: Johny Drilling Provider/Extender: G Referring Provider: Nettie Elm in Treatment: 11 Active Problems ICD-10 Encounter Code Description Active Date Diagnosis L89.150 Pressure ulcer of sacral region, unstageable 06/28/2016 Yes S91.011D Laceration without foreign body, right ankle, subsequent 06/28/2016 Yes encounter S21.219D Laceration without  foreign body of unspecified back wall 06/28/2016 Yes of thorax without penetration into thoracic cavity, subsequent encounter E11.622 Type 2 diabetes mellitus with other skin ulcer 06/28/2016 Yes Inactive Problems Resolved Problems Electronic Signature(s) Signed: 09/13/2016 3:57:41 PM By: Linton Ham MD Entered By: Linton Ham on 09/13/2016 13:53:56 [name], [name] ([id number]) -------------------------------------------------------------------------------- Progress Note Details Patient Name: [name] Date of Service: 09/13/2016 2:15 PM Medical Record Patient Account Number: [id number] Number: Treating RN: Montey Hora 04-03-1940 (75 y.o. Other Clinician: Date of Birth/Sex: Male) Treating Delrico Minehart Primary Care Provider: Johny Drilling Provider/Extender: G Referring Provider: Nettie Elm in Treatment: 11 Subjective History of Present Illness (HPI) 06/28/16; this is a 76 year old man who was admitted to hospital from 6/13 through 06/18/16 at [facility] after being found down in a closet in his home. It is not exactly clear how long he was actually there although it was probably for days. His total CK maximum was at 2582. He was admitted with delirium, possibly heat related illness, possible subendocardial MI. An MRI of the brain was negative. During this hospitalization he was noted to have areas of pressure-related injury for the time he was spent on the floor on his back he was stated to have a stage II pressure ulcer over his buttocks. Multiple excoriations were also noted. He has advanced home care about doing physical therapy there requesting a nurse which seems reasonable. The patient is a diabetic on metformin. They're using bacitracin to all these areas. He was prescribed doxycycline which I believe he is still on. 07/05/16; patient with pressure ulcers on his right greater than left buttock surrounding his coccyx. Excoriation over  the right lateral ankle an excoriation on his upper back. They did not get any Santyl or medihoney. We will take care of this today. 07/12/16 unfortunately patient's wounds appeared to be doing better across the board and the eschar that is overlying the sacral wound has loosened up to where I could debride somewhat today. There's no evidence of 07/20/16; butterfly shaped wound across the sacrum and the surrounding skin and soft tissue. The area on the right is much larger than the left. The area on the left looks healthy on the right still  and necrotic nonviable surface. Using Santyl 07/26/16; butterfly shaped wound across the sacrum and surrounding skin and soft tissue. The area on the right much larger than the left. Extensive debridement on the right last week post-debridement culture showed enterococcus faecalis. This is ampicillin sensitive and he'll definitely need Augmentin. We have been using Santyl.[name] improvement this week 08/02/16; butterfly shaped wound across the sacrum and surrounding skin and soft tissue. The area on the left is just about closed the area on the right has settled into a stage III wound initially unstageable on arrival. This will slow bleed ongoing debridement both mechanically and enzymatically although we are making good progress each week. Using Santyl/moist gauze/border foam 08/09/16 on evaluation today patient presents with a slough covered sacral wound but he tells me he experiences some mild discomfort regarding. This pain seems to be worse mainly because cleansing and he rates this to be a 2 out of 10. With that being said he does seem to be unfortunately after last week's debridement she states that he had a snack a lot of bleeding upon arrival at home making some progress with the Santyl at this point. His daughter actually does the dressing changes for him daily. Wish they had not noted up until that point. Otherwise things have been going well in regard  to treatment. Patient has no nausea, vomiting, or diarrhea as well as no fever or chills. 08/16/16 on evaluation today patient appears to be doing well in regard to his sacral ulcer. He is the dressing [name], Charles K. ([id number]) changes. The only concern that has been made and brought up by his daughter at this point is that the dressings are being sent from the supply company only have a 2 x 2 pad and the wound is larger than 2 x 2 in dimension. She therefore seven difficulty getting the dressing to seal appropriately without leaking or causing other issues. Obviously this has been frustrating. Nonetheless otherwise his wounds seems to be doing well. No fevers, chills, nausea, or vomiting noted at this time. 08/23/16; the patient continues with Santyl as the primary dressing and in the 3 weeks since I have seen this he has made remarkable improvement. His daughter is changing the dressing. 08/30/16; patient continues to make good improvement with Santyl. Wound is smaller. This is on the right buttock side of what was originally a butterfly shaped wound. 09/06/16; patient continues to make improvement in wound dimensions using Santyl. 09/13/16; wound dimensions are improved still using santyl as the primary dressing Objective Constitutional Sitting or standing Blood Pressure is within target range for patient.. Supine Blood Pressure is within target range for patient.. Pulse regular and within target range for patient.Marland Kitchen Respirations regular, non-labored and within target range.. Vitals Time Taken: 1:48 PM, Height: 69 in, Weight: 147.5 lbs, BMI: 21.8, Temperature: 98.1 F, Pulse: 67 bpm, Respiratory Rate: 16 breaths/min, Blood Pressure: 121/45 mmHg. General Notes: Wound exam; the original large butterfly pressure area is now down to a smaller area on the right which contracts each time I see it. Still a stage III technically but dimensions continued to improve. There is no evidence of  surrounding infection Integumentary (Hair, Skin) Wound #1 status is Open. Original cause of wound was Pressure Injury. The wound is located on the Right Sacrum. The wound measures 1.1cm length x 0.8cm width x 0.6cm depth; 0.691cm^2 area and 0.415cm^3 volume. There is muscle and Fat Layer (Subcutaneous Tissue) Exposed exposed. There is no tunneling or undermining noted. There  is a large amount of serous drainage noted. The wound margin is distinct with the outline attached to the wound base. There is large (67-100%) red granulation within the wound bed. There is a small (1-33%) amount of necrotic tissue within the wound bed including Adherent Slough and Necrosis of Muscle. The periwound skin appearance exhibited: Induration, Dry/Scaly, Rubor, Erythema. The surrounding wound skin color is noted with erythema which is circumferential. Periwound temperature was noted as No Abnormality. The periwound has tenderness on palpation. [name], [name] ([id number]) Assessment Active Problems ICD-10 L89.150 - Pressure ulcer of sacral region, unstageable S91.011D - Laceration without foreign body, right ankle, subsequent encounter S21.219D - Laceration without foreign body of unspecified back wall of thorax without penetration into thoracic cavity, subsequent encounter E11.622 - Type 2 diabetes mellitus with other skin ulcer Plan Wound Cleansing: Wound #1 Right Sacrum: Cleanse wound with mild soap and water Primary Wound Dressing: Wound #1 Right Sacrum: Santyl Ointment Secondary Dressing: Wound #1 Right Sacrum: Boardered Foam Dressing Dressing Change Frequency: Wound #1 Right Sacrum: Change dressing every day. Follow-up Appointments: Wound #1 Right Sacrum: Return Appointment in 1 week. Off-Loading: Wound #1 Right Sacrum: Turn and reposition every 2 hours #1 continue Santyl ointment and border foam #2 he can follow up in 2 weeks, his daughters changing the dressing #3 he can call us next  week if there is an issue [name], [name] ([id number]) Electronic Signature(s) Signed: 09/13/2016 3:57:41 PM By: Linton Ham MD Entered By: Linton Ham on 09/13/2016 13:57:31 [place], [name] ([id number]) -------------------------------------------------------------------------------- SuperBill Details Patient Name: [name] Date of Service: 09/13/2016 Medical Record Patient Account Number: [id number] Number: Treating RN: Montey Hora 15-Aug-1940 (75 y.o. Other Clinician: Date of Birth/Sex: Male) Treating Gloriana Piltz Primary Care Provider: Johny Drilling Provider/Extender: G Referring Provider: Nettie Elm in Treatment: 11 Diagnosis Coding ICD-10 Codes Code Description L89.150 Pressure ulcer of sacral region, unstageable S91.011D Laceration without foreign body, right ankle, subsequent encounter Laceration without foreign body of unspecified back wall of thorax without penetration into S21.219D thoracic cavity, subsequent encounter E11.622 Type 2 diabetes mellitus with other skin ulcer Facility Procedures CPT4 Code: [id number] Description: 76195 - DEBRIDE W/O ANES NON SELECT Modifier: Quantity: 1 Physician Procedures CPT4 Code: 0932671 Description: 24580 - WC PHYS LEVEL 2 - EST PT ICD-10 Description Diagnosis L89.150 Pressure ulcer of sacral region, unstageable Modifier: Quantity: 1 Electronic Signature(s) Signed: 09/15/2016 8:32:40 AM By: Gretta Cool, BSN, RN, CWS, Kim RN, BSN Signed: 09/20/2016 4:14:46 PM By: Linton Ham MD Previous Signature: 09/13/2016 3:57:41 PM Version By: Linton Ham MD Entered By: Gretta Cool, BSN, RN, CWS, Kim on 09/14/2016 17:05:07

## 2016-09-15 NOTE — Progress Notes (Signed)
Charles, [name] ([id number]) Visit Report for 09/13/2016 Arrival Information Details Patient Name: [name], [name] Date of Service: 09/13/2016 2:15 PM Medical Record Number: [id number] Patient Account Number: [id number] Date of Birth/Sex: 08/13/1940 (75 y.o. Male) Treating RN: Cornell Barman Primary Care Leigh Blas: Johny Drilling Other Clinician: Referring Alecea Trego: Johny Drilling Treating Zamauri Nez/Extender: Tito Dine in Treatment: 11 Visit Information History Since Last Visit Any new allergies or adverse reactions: No Patient Arrived: [name] Had a fall or experienced change in No Arrival Time: 13:46 activities of daily living that may affect Accompanied By: self risk of falls: Transfer Assistance: None Signs or symptoms of abuse/neglect since last No Patient Identification Verified: Yes visito Secondary Verification Process Completed: Yes Hospitalized since last visit: No Patient Requires Transmission-Based No Has Dressing in Place as Prescribed: Yes Precautions: Pain Present Now: No Patient Has Alerts: Yes Patient Alerts: DM II Electronic Signature(s) Signed: 09/13/2016 4:02:39 PM By: Gretta Cool, BSN, RN, CWS, Kim RN, BSN Entered By: Gretta Cool, BSN, RN, CWS, Kim on 09/13/2016 13:46:44 [place], [name] ([id number]) -------------------------------------------------------------------------------- Encounter Discharge Information Details Patient Name: [name] Date of Service: 09/13/2016 2:15 PM Medical Record Number: [id number] Patient Account Number: [id number] Date of Birth/Sex: 12/22/1940 (75 y.o. Male) Treating RN: Montey Hora Primary Care Imad Shostak: Johny Drilling Other Clinician: Referring Donovon Micheletti: Johny Drilling Treating Lason Eveland/Extender: Tito Dine in Treatment: 11 Encounter Discharge Information Items Schedule Follow-up Appointment: No Medication Reconciliation completed No and provided to Patient/Care Lenor Provencher: Provided on Clinical  Summary of Care: 09/13/2016 Form Type Recipient Paper Patient LM Electronic Signature(s) Signed: 09/14/2016 9:21:03 AM By: Ruthine Dose Entered By: Ruthine Dose on 09/13/2016 13:56:11 [place], [name] ([id number]) -------------------------------------------------------------------------------- Lower Extremity Assessment Details Patient Name: [name] Date of Service: 09/13/2016 2:15 PM Medical Record Number: [id number] Patient Account Number: [id number] Date of Birth/Sex: [birth year] (76 y.o. Male) Treating RN: Cornell Barman Primary Care Paidyn Mcferran: Johny Drilling Other Clinician: Referring Cristian Davitt: Johny Drilling Treating Bonner Larue/Extender: Tito Dine in Treatment: 11 Electronic Signature(s) Signed: 09/13/2016 4:02:39 PM By: Gretta Cool, BSN, RN, CWS, Kim RN, BSN Entered By: Gretta Cool, BSN, RN, CWS, Kim on 09/13/2016 13:52:45 [place], [name] ([id number]) -------------------------------------------------------------------------------- Multi Wound Chart Details Patient Name: [name] Date of Service: 09/13/2016 2:15 PM Medical Record Number: [id number] Patient Account Number: [id number] Date of Birth/Sex: 04-06-1940 (76 y.o. Male) Treating RN: Montey Hora Primary Care Daysy Santini: Johny Drilling Other Clinician: Referring Ereka Brau: Johny Drilling Treating Jaciel Diem/Extender: Ricard Dillon Weeks in Treatment: 11 Vital Signs Height(in): 69 Pulse(bpm): 67 Weight(lbs): 147.5 Blood Pressure 121/45 (mmHg): Body Mass Index(BMI): 22 Temperature(F): 98.1 Respiratory Rate 16 (breaths/min): Photos: [1:No Photos] [N/A:N/A] Wound Location: [1:Right Sacrum] [N/A:N/A] Wounding Event: [1:Pressure Injury] [N/A:N/A] Primary Etiology: [1:Pressure Ulcer] [N/A:N/A] Date Acquired: [1:06/14/2016] [N/A:N/A] Weeks of Treatment: [1:11] [N/A:N/A] Wound Status: [1:Open] [N/A:N/A] Measurements L x W x D 1.1x0.8x0.6 [N/A:N/A] (cm) Area (cm) : [1:0.691] [N/A:N/A] Volume (cm)  : [1:0.415] [N/A:N/A] % Reduction in Area: [1:98.20%] [N/A:N/A] % Reduction in Volume: 94.50% [N/A:N/A] Classification: [1:Category/Stage IV] [N/A:N/A] Exudate Amount: [1:Large] [N/A:N/A] Exudate Type: [1:Serous] [N/A:N/A] Exudate Color: [1:amber] [N/A:N/A] Wound Margin: [1:Distinct, outline attached] [N/A:N/A] Granulation Amount: [1:Large (67-100%)] [N/A:N/A] Granulation Quality: [1:Red] [N/A:N/A] Necrotic Amount: [1:Small (1-33%)] [N/A:N/A] Exposed Structures: [1:Fat Layer (Subcutaneous Tissue) Exposed: Yes Muscle: Yes Fascia: No Tendon: No Joint: No Bone: No] [N/A:N/A] Epithelialization: [1:Small (1-33%)] [N/A:N/A] Periwound Skin Texture: Induration: Yes [N/A:N/A] Periwound Skin Dry/Scaly: Yes N/A N/A Moisture: Periwound Skin Color: Erythema: Yes N/A N/A Rubor: Yes Erythema Location: Circumferential N/A N/A Temperature: No Abnormality N/A N/A  Tenderness on Yes N/A N/A Palpation: Wound Preparation: Ulcer Cleansing: N/A N/A Rinsed/Irrigated with Saline Topical Anesthetic Applied: Other: lidocaine 4% Treatment Notes Electronic Signature(s) Signed: 09/13/2016 3:57:41 PM By: Linton Ham MD Entered By: Linton Ham on 09/13/2016 13:55:02 [place], [name] ([id number]) -------------------------------------------------------------------------------- Multi-Disciplinary Care Plan Details Patient Name: [name] Date of Service: 09/13/2016 2:15 PM Medical Record Number: [id number] Patient Account Number: [id number] Date of Birth/Sex: 18-Apr-1940 (76 y.o. Male) Treating RN: Cornell Barman Primary Care Zela Sobieski: Johny Drilling Other Clinician: Referring Jediah Horger: Johny Drilling Treating Damondre Pfeifle/Extender: Tito Dine in Treatment: 11 Active Inactive ` Abuse / Safety / Falls / Self Care Management Nursing Diagnoses: Potential for falls Goals: Patient will not experience any injury related to falls Date Initiated: 06/28/2016 Target Resolution Date:  10/08/2016 Goal Status: Active Interventions: Assess Activities of Daily Living upon admission and as needed Assess: immobility, friction, shearing, incontinence upon admission and as needed Assess impairment of mobility on admission and as needed per policy Notes: ` Nutrition Nursing Diagnoses: Imbalanced nutrition Impaired glucose control: actual or potential Potential for alteratiion in Nutrition/Potential for imbalanced nutrition Goals: Patient/caregiver agrees to and verbalizes understanding of need to use nutritional supplements and/or vitamins as prescribed Date Initiated: 06/28/2016 Target Resolution Date: 09/10/2016 Goal Status: Active Patient/caregiver verbalizes understanding of need to maintain therapeutic glucose control per primary care physician Date Initiated: 06/28/2016 Target Resolution Date: 09/10/2016 Goal Status: Active Interventions: MCADAMSIhan, Pat ([id number]) Assess patient nutrition upon admission and as needed per policy Notes: ` Orientation to the Wound Care Program Nursing Diagnoses: Knowledge deficit related to the wound healing center program Goals: Patient/caregiver will verbalize understanding of the [place] [facility] Date Initiated: 06/28/2016 Target Resolution Date: 07/09/2016 Goal Status: Active Interventions: Provide education on orientation to the wound center Notes: ` Pain, Acute or Chronic Nursing Diagnoses: Pain, acute or chronic: actual or potential Potential alteration in comfort, pain Goals: Patient/caregiver will verbalize adequate pain control between visits Date Initiated: 06/28/2016 Target Resolution Date: 10/08/2016 Goal Status: Active Interventions: Assess comfort goal upon admission Complete pain assessment as per visit requirements Notes: ` Pressure Nursing Diagnoses: Knowledge deficit related to causes and risk factors for pressure ulcer development Knowledge deficit related to management of pressures  ulcers Potential for impaired tissue integrity related to pressure, friction, moisture, and shear Goals: Patient will remain free from development of additional pressure ulcers Gockel, [place] ([id number]) Date Initiated: 06/28/2016 Target Resolution Date: 10/08/2016 Goal Status: Active Interventions: Assess: immobility, friction, shearing, incontinence upon admission and as needed Assess offloading mechanisms upon admission and as needed Notes: ` Wound/Skin Impairment Nursing Diagnoses: Impaired tissue integrity Knowledge deficit related to ulceration/compromised skin integrity Goals: Ulcer/skin breakdown will have a volume reduction of 80% by week 12 Date Initiated: 06/28/2016 Target Resolution Date: 10/01/2016 Goal Status: Active Interventions: Assess patient/caregiver ability to perform ulcer/skin care regimen upon admission and as needed Assess ulceration(s) every visit Notes: Electronic Signature(s) Signed: 09/13/2016 4:02:39 PM By: Gretta Cool, BSN, RN, CWS, Kim RN, BSN Entered By: Gretta Cool, BSN, RN, CWS, Kim on 09/13/2016 13:52:52 [place], [name] ([id number]) -------------------------------------------------------------------------------- Pain Assessment Details Patient Name: [name] Date of Service: 09/13/2016 2:15 PM Medical Record Number: [id number] Patient Account Number: [id number] Date of Birth/Sex: 1940-08-26 (75 y.o. Male) Treating RN: Cornell Barman Primary Care Patrena Santalucia: Johny Drilling Other Clinician: Referring Armonii Sieh: Johny Drilling Treating Orva Gwaltney/Extender: Tito Dine in Treatment: 11 Active Problems Location of Pain Severity and Description of Pain Patient Has Paino No Site Locations With Dressing Change: No Pain  Management and Medication Current Pain Management: Electronic Signature(s) Signed: 09/13/2016 4:02:39 PM By: Gretta Cool, BSN, RN, CWS, Kim RN, BSN Entered By: Gretta Cool, BSN, RN, CWS, Kim on 09/13/2016 13:48:10 [place], [name]  ([id number]) -------------------------------------------------------------------------------- Wound Assessment Details Patient Name: [name] Date of Service: 09/13/2016 2:15 PM Medical Record Number: [id number] Patient Account Number: [id number] Date of Birth/Sex: 10-Oct-1940 (75 y.o. Male) Treating RN: Cornell Barman Primary Care Lilyannah Zuelke: Johny Drilling Other Clinician: Referring Issam Carlyon: Johny Drilling Treating Laritza Vokes/Extender: Ricard Dillon Weeks in Treatment: 11 Wound Status Wound Number: 1 Primary Etiology: Pressure Ulcer Wound Location: Right Sacrum Wound Status: Open Wounding Event: Pressure Injury Date Acquired: 06/14/2016 Weeks Of Treatment: 11 Clustered Wound: No Photos Photo Uploaded By: Gretta Cool, BSN, RN, CWS, Kim on 09/13/2016 15:47:09 Wound Measurements Length: (cm) 1.1 Width: (cm) 0.8 Depth: (cm) 0.6 Area: (cm) 0.691 Volume: (cm) 0.415 % Reduction in Area: 98.2% % Reduction in Volume: 94.5% Epithelialization: Small (1-33%) Tunneling: No Undermining: No Wound Description Classification: Category/Stage IV Wound Margin: Distinct, outline attached Exudate Amount: Large Exudate Type: Serous Exudate Color: amber Foul Odor After Cleansing: No Slough/Fibrino Yes Wound Bed Granulation Amount: Large (67-100%) Exposed Structure Granulation Quality: Red Fascia Exposed: No Necrotic Amount: Small (1-33%) Fat Layer (Subcutaneous Tissue) Exposed: Yes Necrotic Quality: Adherent Slough Tendon Exposed: No Muscle Exposed: Yes Necrosis of Muscle: Yes Joint Exposed: No Bowmer, Miraj K. ([id number]) Bone Exposed: No Periwound Skin Texture Texture Color No Abnormalities Noted: No No Abnormalities Noted: No Induration: Yes Erythema: Yes Erythema Location: Circumferential Moisture Rubor: Yes No Abnormalities Noted: No Dry / Scaly: Yes Temperature / Pain Temperature: No Abnormality Tenderness on Palpation: Yes Wound Preparation Ulcer Cleansing:  Rinsed/Irrigated with Saline Topical Anesthetic Applied: Other: lidocaine 4%, Electronic Signature(s) Signed: 09/13/2016 4:02:39 PM By: Gretta Cool, BSN, RN, CWS, Kim RN, BSN Entered By: Gretta Cool, BSN, RN, CWS, Kim on 09/13/2016 13:50:01 [place], [name] ([id number]) -------------------------------------------------------------------------------- Vitals Details Patient Name: [name] Date of Service: 09/13/2016 2:15 PM Medical Record Number: [id number] Patient Account Number: [id number] Date of Birth/Sex: 1940/03/10 (75 y.o. Male) Treating RN: Cornell Barman Primary Care Holly Iannaccone: Johny Drilling Other Clinician: Referring Chrystle Murillo: Johny Drilling Treating Chinyere Galiano/Extender: Tito Dine in Treatment: 11 Vital Signs Time Taken: 13:48 Temperature (F): 98.1 Height (in): 69 Pulse (bpm): 67 Weight (lbs): 147.5 Respiratory Rate (breaths/min): 16 Body Mass Index (BMI): 21.8 Blood Pressure (mmHg): 121/45 Reference Range: 80 - 120 mg / dl Electronic Signature(s) Signed: 09/13/2016 4:02:39 PM By: Gretta Cool, BSN, RN, CWS, Kim RN, BSN Entered By: Gretta Cool, BSN, RN, CWS, Kim on 09/13/2016 13:48:26

## 2016-09-27 ENCOUNTER — Encounter: Payer: Medicare Other | Admitting: Internal Medicine

## 2016-09-27 DIAGNOSIS — E11622 Type 2 diabetes mellitus with other skin ulcer: Secondary | ICD-10-CM | POA: Diagnosis not present

## 2016-09-28 NOTE — Progress Notes (Signed)
KEMET, NIJJAR ([id number]) Visit Report for 09/27/2016 Debridement Details Patient Name: [name], [name] Date of Service: 09/27/2016 1:30 PM Medical Record Patient Account Number: [id number] Number: Treating RN: Montey Hora Sep 14, 1940 (75 y.o. Other Clinician: Date of Birth/Sex: Male) Treating ROBSON, MICHAEL Primary Care Provider: Johny Drilling Provider/Extender: G Referring Provider: Nettie Elm in Treatment: 13 Debridement Performed for Wound #1 Right Sacrum Assessment: Performed By: Physician Ricard Dillon, MD Debridement: Debridement Pre-procedure Verification/Time Out Yes - 13:18 Taken: Start Time: 13:18 Pain Control: Lidocaine 4% Topical Solution Level: Skin/Subcutaneous Tissue Total Area Debrided (L x 0.9 (cm) x 0.4 (cm) = 0.36 (cm) W): Tissue and other Viable, Non-Viable, Fibrin/Slough, Subcutaneous material debrided: Instrument: Curette Bleeding: Minimum Hemostasis Achieved: Pressure End Time: 13:20 Procedural Pain: 0 Post Procedural Pain: 0 Response to Treatment: Procedure was tolerated well Post Debridement Measurements of Total Wound Length: (cm) 0.9 Stage: Category/Stage IV Width: (cm) 0.4 Depth: (cm) 0.8 Volume: (cm) 0.226 Character of Wound/Ulcer Post Improved Debridement: Post Procedure Diagnosis Same as Pre-procedure Electronic Signature(s) JELANI, TRUEBA ([id number]) Signed: 09/27/2016 3:49:32 PM By: Montey Hora Signed: 09/27/2016 4:14:11 PM By: Linton Ham MD Entered By: Linton Ham on 09/27/2016 13:24:19 Luf[place], [name] ([id number]) -------------------------------------------------------------------------------- HPI Details Patient Name: [name] Date of Service: 09/27/2016 1:30 PM Medical Record Patient Account Number: [id number] Number: Treating RN: Montey Hora 05-02-1940 (75 y.o. Other Clinician: Date of Birth/Sex: Male) Treating ROBSON, MICHAEL Primary Care  Provider: Johny Drilling Provider/Extender: G Referring Provider: Nettie Elm in Treatment: 13 History of Present Illness HPI Description: 06/28/16; this is a 76 year old man who was admitted to hospital from 6/13 through 06/18/16 at [facility] after being found down in a closet in his home. It is not exactly clear how long he was actually there although it was probably for days. His total CK maximum was at 2582. He was admitted with delirium, possibly heat related illness, possible subendocardial MI. An MRI of the brain was negative. During this hospitalization he was noted to have areas of pressure-related injury for the time he was spent on the floor on his back he was stated to have a stage II pressure ulcer over his buttocks. Multiple excoriations were also noted. He has advanced home care about doing physical therapy there requesting a nurse which seems reasonable. The patient is a diabetic on metformin. They're using bacitracin to all these areas. He was prescribed doxycycline which I believe he is still on. 07/05/16; patient with pressure ulcers on his right greater than left buttock surrounding his coccyx. Excoriation over the right lateral ankle an excoriation on his upper back. They did not get any Santyl or medihoney. We will take care of this today. 07/12/16 unfortunately patient's wounds appeared to be doing better across the board and the eschar that is overlying the sacral wound has loosened up to where I could debride somewhat today. There's no evidence of 07/20/16; butterfly shaped wound across the sacrum and the surrounding skin and soft tissue. The area on the right is much larger than the left. The area on the left looks healthy on the right still and necrotic nonviable surface. Using Santyl 07/26/16; butterfly shaped wound across the sacrum and surrounding skin and soft tissue. The area on the right much larger than the left. Extensive debridement on the right  last week post-debridement culture showed enterococcus faecalis. This is ampicillin sensitive and he'll definitely need Augmentin. We have been using Santyl.Kermit Balo improvement this week 08/02/16; butterfly shaped wound across  the sacrum and surrounding skin and soft tissue. The area on the left is just about closed the area on the right has settled into a stage III wound initially unstageable on arrival. This will slow bleed ongoing debridement both mechanically and enzymatically although we are making good progress each week. Using Santyl/moist gauze/border foam 08/09/16 on evaluation today patient presents with a slough covered sacral wound but he tells me he experiences some mild discomfort regarding. This pain seems to be worse mainly because cleansing and he rates this to be a 2 out of 10. With that being said he does seem to be unfortunately after last week's debridement she states that he had a snack a lot of bleeding upon arrival at home making some progress with the Santyl at this point. His daughter actually does the dressing changes for him daily. Wish they had not noted up until that point. Otherwise things have been going well in regard to treatment. Patient has no nausea, vomiting, or diarrhea as well as no fever or chills. 08/16/16 on evaluation today patient appears to be doing well in regard to his sacral ulcer. He is the dressing changes. The only concern that has been made and brought up by his daughter at this point is that the [facility]. ([id number]) dressings are being sent from the supply company only have a 2 x 2 pad and the wound is larger than 2 x 2 in dimension. She therefore seven difficulty getting the dressing to seal appropriately without leaking or causing other issues. Obviously this has been frustrating. Nonetheless otherwise his wounds seems to be doing well. No fevers, chills, nausea, or vomiting noted at this time. 08/23/16; the patient continues with Santyl  as the primary dressing and in the 3 weeks since I have seen this he has made remarkable improvement. His daughter is changing the dressing. 08/30/16; patient continues to make good improvement with Santyl. Wound is smaller. This is on the right buttock side of what was originally a butterfly shaped wound. 09/06/16; patient continues to make improvement in wound dimensions using Santyl. 09/13/16; wound dimensions are improved still using santyl as the primary dressing 09/27/16; somewhere around 0.9 cm in depth of the wound today which is deeper than last time followed although it is a small reminiscent of a much larger wound. Had been using Santyl today but this will need to change today. Electronic Signature(s) Signed: 09/27/2016 4:14:11 PM By: Linton Ham MD Entered By: Linton Ham on 09/27/2016 13:25:07 Rohde, [name] ([id number]) -------------------------------------------------------------------------------- Physical Exam Details Patient Name: [name] Date of Service: 09/27/2016 1:30 PM Medical Record Patient Account Number: [id number] Number: Treating RN: Montey Hora Aug 16, 1940 (75 y.o. Other Clinician: Date of Birth/Sex: Male) Treating ROBSON, MICHAEL Primary Care Provider: Johny Drilling Provider/Extender: G Referring Provider: Nettie Elm in Treatment: 13 Constitutional Sitting or standing Blood Pressure is within target range for patient.. Pulse regular and within target range for patient.Marland Kitchen Respirations regular, non-labored and within target range.. Temperature is normal and within the target range for the patient.Marland Kitchen appears in no distress. Notes Wound exam; the original large butterfly shaped pressure area is now down to a small area on the right however it has 0.9 cm of depth today which I think is more than it has recently. Small orifice. Using a #3 curet the wound surface is debrided of necrotic material although there is very little of  this. Hemostasis with direct pressure there is no evidence of surrounding infection Electronic Signature(s)  Signed: 09/27/2016 4:14:11 PM By: Linton Ham MD Entered By: Linton Ham on 09/27/2016 13:29:29 Shrum, [name] ([id number]) -------------------------------------------------------------------------------- Physician Orders Details Patient Name: [name] Date of Service: 09/27/2016 1:30 PM Medical Record Patient Account Number: [id number] Number: Treating RN: Montey Hora 08/25/1940 (75 y.o. Other Clinician: Date of Birth/Sex: Male) Treating ROBSON, MICHAEL Primary Care Provider: Johny Drilling Provider/Extender: G Referring Provider: Nettie Elm in Treatment: 13 Verbal / Phone Orders: No Diagnosis Coding Wound Cleansing Wound #1 Right Sacrum o Cleanse wound with mild soap and water Primary Wound Dressing Wound #1 Right Sacrum o Other: - endoform Secondary Dressing Wound #1 Right Sacrum o Dry Gauze o Boardered Foam Dressing Dressing Change Frequency Wound #1 Right Sacrum o Change dressing every day. Follow-up Appointments Wound #1 Right Sacrum o Return Appointment in 1 week. Off-Loading Wound #1 Right Sacrum o Turn and reposition every 2 hours Electronic Signature(s) Signed: 09/27/2016 3:49:32 PM By: Montey Hora Signed: 09/27/2016 4:14:11 PM By: Linton Ham MD Entered By: Montey Hora on 09/27/2016 13:23:47 Fizer, [name] ([id number]) -------------------------------------------------------------------------------- Problem List Details Patient Name: [name] Date of Service: 09/27/2016 1:30 PM Medical Record Patient Account Number: [id number] Number: Treating RN: Montey Hora [birth year] (75 y.o. Other Clinician: Date of Birth/Sex: Male) Treating ROBSON, MICHAEL Primary Care Provider: Johny Drilling Provider/Extender: G Referring Provider: Nettie Elm in Treatment:  13 Active Problems ICD-10 Encounter Code Description Active Date Diagnosis L89.150 Pressure ulcer of sacral region, unstageable 06/28/2016 Yes S91.011D Laceration without foreign body, right ankle, subsequent 06/28/2016 Yes encounter S21.219D Laceration without foreign body of unspecified back wall 06/28/2016 Yes of thorax without penetration into thoracic cavity, subsequent encounter E11.622 Type 2 diabetes mellitus with other skin ulcer 06/28/2016 Yes Inactive Problems Resolved Problems Electronic Signature(s) Signed: 09/27/2016 4:14:11 PM By: Linton Ham MD Entered By: Linton Ham on 09/27/2016 13:23:41 Adami, [name] ([id number]) -------------------------------------------------------------------------------- Progress Note Details Patient Name: [name] Date of Service: 09/27/2016 1:30 PM Medical Record Patient Account Number: [id number] Number: Treating RN: Montey Hora 06-19-1940 (75 y.o. Other Clinician: Date of Birth/Sex: Male) Treating ROBSON, MICHAEL Primary Care Provider: Johny Drilling Provider/Extender: G Referring Provider: Nettie Elm in Treatment: 13 Subjective History of Present Illness (HPI) 06/28/16; this is a 76 year old man who was admitted to hospital from 6/13 through 06/18/16 at [facility] after being found down in a closet in his home. It is not exactly clear how long he was actually there although it was probably for days. His total CK maximum was at 2582. He was admitted with delirium, possibly heat related illness, possible subendocardial MI. An MRI of the brain was negative. During this hospitalization he was noted to have areas of pressure-related injury for the time he was spent on the floor on his back he was stated to have a stage II pressure ulcer over his buttocks. Multiple excoriations were also noted. He has advanced home care about doing physical therapy there requesting a nurse which seems  reasonable. The patient is a diabetic on metformin. They're using bacitracin to all these areas. He was prescribed doxycycline which I believe he is still on. 07/05/16; patient with pressure ulcers on his right greater than left buttock surrounding his coccyx. Excoriation over the right lateral ankle an excoriation on his upper back. They did not get any Santyl or medihoney. We will take care of this today. 07/12/16 unfortunately patient's wounds appeared to be doing better across the board and the eschar that is overlying the sacral wound  has loosened up to where I could debride somewhat today. There's no evidence of 07/20/16; butterfly shaped wound across the sacrum and the surrounding skin and soft tissue. The area on the right is much larger than the left. The area on the left looks healthy on the right still and necrotic nonviable surface. Using Santyl 07/26/16; butterfly shaped wound across the sacrum and surrounding skin and soft tissue. The area on the right much larger than the left. Extensive debridement on the right last week post-debridement culture showed enterococcus faecalis. This is ampicillin sensitive and he'll definitely need Augmentin. We have been using Santyl.Kermit Balo improvement this week 08/02/16; butterfly shaped wound across the sacrum and surrounding skin and soft tissue. The area on the left is just about closed the area on the right has settled into a stage III wound initially unstageable on arrival. This will slow bleed ongoing debridement both mechanically and enzymatically although we are making good progress each week. Using Santyl/moist gauze/border foam 08/09/16 on evaluation today patient presents with a slough covered sacral wound but he tells me he experiences some mild discomfort regarding. This pain seems to be worse mainly because cleansing and he rates this to be a 2 out of 10. With that being said he does seem to be unfortunately after last week's debridement  she states that he had a snack a lot of bleeding upon arrival at home making some progress with the Santyl at this point. His daughter actually does the dressing changes for him daily. Wish they had not noted up until that point. Otherwise things have been going well in regard to treatment. Patient has no nausea, vomiting, or diarrhea as well as no fever or chills. 08/16/16 on evaluation today patient appears to be doing well in regard to his sacral ulcer. He is the dressing Gawthrop, Mosi K. ([id number]) changes. The only concern that has been made and brought up by his daughter at this point is that the dressings are being sent from the supply company only have a 2 x 2 pad and the wound is larger than 2 x 2 in dimension. She therefore seven difficulty getting the dressing to seal appropriately without leaking or causing other issues. Obviously this has been frustrating. Nonetheless otherwise his wounds seems to be doing well. No fevers, chills, nausea, or vomiting noted at this time. 08/23/16; the patient continues with Santyl as the primary dressing and in the 3 weeks since I have seen this he has made remarkable improvement. His daughter is changing the dressing. 08/30/16; patient continues to make good improvement with Santyl. Wound is smaller. This is on the right buttock side of what was originally a butterfly shaped wound. 09/06/16; patient continues to make improvement in wound dimensions using Santyl. 09/13/16; wound dimensions are improved still using santyl as the primary dressing 09/27/16; somewhere around 0.9 cm in depth of the wound today which is deeper than last time followed although it is a small reminiscent of a much larger wound. Had been using Santyl today but this will need to change today. Objective Constitutional Sitting or standing Blood Pressure is within target range for patient.. Pulse regular and within target range for patient.Marland Kitchen Respirations regular, non-labored and  within target range.. Temperature is normal and within the target range for the patient.Marland Kitchen appears in no distress. Vitals Time Taken: 1:08 PM, Height: 69 in, Weight: 147.5 lbs, BMI: 21.8, Temperature: 98.4 F, Pulse: 67 bpm, Respiratory Rate: 16 breaths/min, Blood Pressure: 105/61 mmHg. General Notes: Wound exam;  the original large butterfly shaped pressure area is now down to a small area on the right however it has 0.9 cm of depth today which I think is more than it has recently. Small orifice. Using a #3 curet the wound surface is debrided of necrotic material although there is very little of this. Hemostasis with direct pressure there is no evidence of surrounding infection Integumentary (Hair, Skin) Wound #1 status is Open. Original cause of wound was Pressure Injury. The wound is located on the Right Sacrum. The wound measures 0.9cm length x 0.4cm width x 0.4cm depth; 0.283cm^2 area and 0.113cm^3 volume. There is muscle and Fat Layer (Subcutaneous Tissue) Exposed exposed. There is no tunneling or undermining noted. There is a large amount of serous drainage noted. The wound margin is distinct with the outline attached to the wound base. There is large (67-100%) red granulation within the wound bed. There is a small (1-33%) amount of necrotic tissue within the wound bed including Adherent Slough and Necrosis of Muscle. The periwound skin appearance exhibited: Induration, Dry/Scaly, Rubor, Erythema. The surrounding wound skin color is noted with erythema which is circumferential. Periwound temperature was Petersen, Ryen K. ([id number]) noted as No Abnormality. The periwound has tenderness on palpation. Assessment Active Problems ICD-10 L89.150 - Pressure ulcer of sacral region, unstageable S91.011D - Laceration without foreign body, right ankle, subsequent encounter S21.219D - Laceration without foreign body of unspecified back wall of thorax without penetration into thoracic cavity,  subsequent encounter E11.622 - Type 2 diabetes mellitus with other skin ulcer Procedures Wound #1 Pre-procedure diagnosis of Wound #1 is a Pressure Ulcer located on the Right Sacrum . There was a Skin/Subcutaneous Tissue Debridement ([id number]) debridement with total area of 0.36 sq cm performed by Ricard Dillon, MD. with the following instrument(s): Curette to remove Viable and Non-Viable tissue/material including Fibrin/Slough and Subcutaneous after achieving pain control using Lidocaine 4% Topical Solution. A time out was conducted at 13:18, prior to the start of the procedure. A Minimum amount of bleeding was controlled with Pressure. The procedure was tolerated well with a pain level of 0 throughout and a pain level of 0 following the procedure. Post Debridement Measurements: 0.9cm length x 0.4cm width x 0.8cm depth; 0.226cm^3 volume. Post debridement Stage noted as Category/Stage IV. Character of Wound/Ulcer Post Debridement is improved. Post procedure Diagnosis Wound #1: Same as Pre-Procedure Plan Wound Cleansing: Wound #1 Right Sacrum: Cleanse wound with mild soap and water Primary Wound Dressing: Wound #1 Right Sacrum: Other: - endoform Bilyeu, Angelito K. ([id number]) Secondary Dressing: Wound #1 Right Sacrum: Dry Gauze Boardered Foam Dressing Dressing Change Frequency: Wound #1 Right Sacrum: Change dressing every day. Follow-up Appointments: Wound #1 Right Sacrum: Return Appointment in 1 week. Off-Loading: Wound #1 Right Sacrum: Turn and reposition every 2 hours #1 change primary dressing to endoform. 2 need to promote granulation Electronic Signature(s) Signed: 09/27/2016 4:14:11 PM By: Linton Ham MD Entered By: Linton Ham on 09/27/2016 13:31:22 [place], [name] ([id number]) -------------------------------------------------------------------------------- SuperBill Details Patient Name: [name] Date of Service: 09/27/2016 Medical Record  Patient Account Number: [id number] Number: Treating RN: Montey Hora Jun 02, 1940 (75 y.o. Other Clinician: Date of Birth/Sex: Male) Treating ROBSON, MICHAEL Primary Care Provider: Johny Drilling Provider/Extender: G Referring Provider: Nettie Elm in Treatment: 13 Diagnosis Coding ICD-10 Codes Code Description L89.150 Pressure ulcer of sacral region, unstageable S91.011D Laceration without foreign body, right ankle, subsequent encounter Laceration without foreign body of unspecified back wall of thorax without penetration into S21.219D thoracic cavity,  subsequent encounter E11.622 Type 2 diabetes mellitus with other skin ulcer Facility Procedures CPT4 Code: [id number] Description: 37290 - DEB SUBQ TISSUE 20 SQ CM/< ICD-10 Description Diagnosis L89.150 Pressure ulcer of sacral region, unstageable Modifier: Quantity: 1 Physician Procedures CPT4 Code: [id number] Description: 08022 - WC PHYS SUBQ TISS 20 SQ CM ICD-10 Description Diagnosis L89.150 Pressure ulcer of sacral region, unstageable Modifier: Quantity: 1 Electronic Signature(s) Signed: 09/27/2016 4:14:11 PM By: Linton Ham MD Entered By: Linton Ham on 09/27/2016 13:31:45

## 2016-09-28 NOTE — Progress Notes (Signed)
[name], [name] ([id number]) Visit Report for 09/27/2016 Arrival Information Details Patient Name: [name], [name] Date of Service: 09/27/2016 1:30 PM Medical Record Number: [id number] Patient Account Number: [id number] Date of Birth/Sex: 1940/07/27 (76 y.o. Male) Treating RN: Montey Hora Primary Care Antwonette Feliz: Johny Drilling Other Clinician: Referring Arlisa Leclere: Johny Drilling Treating Haruko Mersch/Extender: Tito Dine in Treatment: 13 Visit Information History Since Last Visit Added or deleted any medications: No Patient Arrived: [name] Any new allergies or adverse reactions: No Arrival Time: 13:07 Had a fall or experienced change in No Accompanied By: self activities of daily living that may affect Transfer Assistance: None risk of falls: Patient Identification Verified: Yes Signs or symptoms of abuse/neglect since last No Secondary Verification Process Completed: Yes visito Patient Requires Transmission-Based No Hospitalized since last visit: No Precautions: Has Dressing in Place as Prescribed: Yes Patient Has Alerts: Yes Pain Present Now: No Patient Alerts: DM II Electronic Signature(s) Signed: 09/27/2016 3:49:32 PM By: Montey Hora Entered By: Montey Hora on 09/27/2016 13:07:29 [place], [name] ([id number]) -------------------------------------------------------------------------------- Encounter Discharge Information Details Patient Name: [name] Date of Service: 09/27/2016 1:30 PM Medical Record Number: [id number] Patient Account Number: [id number] Date of Birth/Sex: [birth year] (75 y.o. Male) Treating RN: Montey Hora Primary Care Jennamarie Goings: Johny Drilling Other Clinician: Referring Averee Harb: Johny Drilling Treating Elianie Hubers/Extender: Tito Dine in Treatment: 13 Encounter Discharge Information Items Discharge Pain Level: 0 Discharge Condition: Stable Ambulatory Status: Cane Discharge Destination: Home Transportation:  Private Auto Accompanied By: self Schedule Follow-up Appointment: Yes Medication Reconciliation completed No and provided to Patient/Care Manson [place]: Provided on Clinical Summary of Care: 09/27/2016 Form Type Recipient Paper Patient LM Electronic Signature(s) Signed: 09/27/2016 4:21:28 PM By: Ruthine Dose Entered By: Ruthine Dose on 09/27/2016 13:33:33 [place], [name] ([id number]) -------------------------------------------------------------------------------- Multi Wound Chart Details Patient Name: [name] Date of Service: 09/27/2016 1:30 PM Medical Record Number: [id number] Patient Account Number: [id number] Date of Birth/Sex: Mar 12, 1940 (76 y.o. Male) Treating RN: Montey Hora Primary Care Reade Trefz: Johny Drilling Other Clinician: Referring Aeon Kessner: Johny Drilling Treating Jakevion Arney/Extender: Ricard Dillon Weeks in Treatment: 13 Vital Signs Height(in): 69 Pulse(bpm): 67 Weight(lbs): 147.5 Blood Pressure 105/61 (mmHg): Body Mass Index(BMI): 22 Temperature(F): 98.4 Respiratory Rate 16 (breaths/min): Photos: [1:No Photos] [N/A:N/A] Wound Location: [1:Right Sacrum] [N/A:N/A] Wounding Event: [1:Pressure Injury] [N/A:N/A] Primary Etiology: [1:Pressure Ulcer] [N/A:N/A] Date Acquired: [1:06/14/2016] [N/A:N/A] Weeks of Treatment: [1:13] [N/A:N/A] Wound Status: [1:Open] [N/A:N/A] Measurements L x W x D 0.9x0.4x0.4 [N/A:N/A] (cm) Area (cm) : [1:0.283] [N/A:N/A] Volume (cm) : [1:0.113] [N/A:N/A] % Reduction in Area: [1:99.30%] [N/A:N/A] % Reduction in Volume: 98.50% [N/A:N/A] Classification: [1:Category/Stage IV] [N/A:N/A] Exudate Amount: [1:Large] [N/A:N/A] Exudate Type: [1:Serous] [N/A:N/A] Exudate Color: [1:amber] [N/A:N/A] Wound Margin: [1:Distinct, outline attached] [N/A:N/A] Granulation Amount: [1:Large (67-100%)] [N/A:N/A] Granulation Quality: [1:Red] [N/A:N/A] Necrotic Amount: [1:Small (1-33%)] [N/A:N/A] Exposed Structures: [1:Fat Layer  (Subcutaneous Tissue) Exposed: Yes Muscle: Yes Fascia: No Tendon: No Joint: No Bone: No] [N/A:N/A] Epithelialization: [1:Small (1-33%)] [N/A:N/A] Debridement: [N/A:N/A] Debridement (11042- 11047) Pre-procedure 13:18 N/A N/A Verification/Time Out Taken: Pain Control: Lidocaine 4% Topical N/A N/A Solution Tissue Debrided: Fibrin/Slough, N/A N/A Subcutaneous Level: Skin/Subcutaneous N/A N/A Tissue Debridement Area (sq 0.36 N/A N/A cm): Instrument: Curette N/A N/A Bleeding: Minimum N/A N/A Hemostasis Achieved: Pressure N/A N/A Procedural Pain: 0 N/A N/A Post Procedural Pain: 0 N/A N/A Debridement Treatment Procedure was tolerated N/A N/A Response: well Post Debridement 0.9x0.4x0.8 N/A N/A Measurements L x W x D (cm) Post Debridement 0.226 N/A N/A Volume: (cm) Post Debridement Category/Stage IV N/A N/A Stage: Periwound  Skin Texture: Induration: Yes N/A N/A Periwound Skin Dry/Scaly: Yes N/A N/A Moisture: Periwound Skin Color: Erythema: Yes N/A N/A Rubor: Yes Erythema Location: Circumferential N/A N/A Temperature: No Abnormality N/A N/A Tenderness on Yes N/A N/A Palpation: Wound Preparation: Ulcer Cleansing: N/A N/A Rinsed/Irrigated with Saline Topical Anesthetic Applied: Other: lidocaine 4% Procedures Performed: Debridement N/A N/A Treatment Notes Electronic Signature(s) Signed: 09/27/2016 4:14:11 PM By: Linton Ham MD [place], [name] ([id number]) Entered By: Linton Ham on 09/27/2016 13:23:48 Z[place]ke, [name] ([id number]) -------------------------------------------------------------------------------- Multi-Disciplinary Care Plan Details Patient Name: [name] Date of Service: 09/27/2016 1:30 PM Medical Record Number: [id number] Patient Account Number: [id number] Date of Birth/Sex: 1940/01/16 (76 y.o. Male) Treating RN: Montey Hora Primary Care Karalee Hauter: Johny Drilling Other Clinician: Referring Mili Piltz: Johny Drilling Treating  Kionna Brier/Extender: Tito Dine in Treatment: 13 Active Inactive ` Abuse / Safety / Falls / Self Care Management Nursing Diagnoses: Potential for falls Goals: Patient will not experience any injury related to falls Date Initiated: 06/28/2016 Target Resolution Date: 10/08/2016 Goal Status: Active Interventions: Assess Activities of Daily Living upon admission and as needed Assess: immobility, friction, shearing, incontinence upon admission and as needed Assess impairment of mobility on admission and as needed per policy Notes: ` Nutrition Nursing Diagnoses: Imbalanced nutrition Impaired glucose control: actual or potential Potential for alteratiion in Nutrition/Potential for imbalanced nutrition Goals: Patient/caregiver agrees to and verbalizes understanding of need to use nutritional supplements and/or vitamins as prescribed Date Initiated: 06/28/2016 Target Resolution Date: 09/10/2016 Goal Status: Active Patient/caregiver verbalizes understanding of need to maintain therapeutic glucose control per primary care physician Date Initiated: 06/28/2016 Target Resolution Date: 09/10/2016 Goal Status: Active Interventions: MCADAMSErvine, Witucki ([id number]) Assess patient nutrition upon admission and as needed per policy Notes: ` Orientation to the Wound Care Program Nursing Diagnoses: Knowledge deficit related to the wound healing center program Goals: Patient/caregiver will verbalize understanding of the [place] Program Date Initiated: 06/28/2016 Target Resolution Date: 07/09/2016 Goal Status: Active Interventions: Provide education on orientation to the wound center Notes: ` Pain, Acute or Chronic Nursing Diagnoses: Pain, acute or chronic: actual or potential Potential alteration in comfort, pain Goals: Patient/caregiver will verbalize adequate pain control between visits Date Initiated: 06/28/2016 Target Resolution Date: 10/08/2016 Goal Status:  Active Interventions: Assess comfort goal upon admission Complete pain assessment as per visit requirements Notes: ` Pressure Nursing Diagnoses: Knowledge deficit related to causes and risk factors for pressure ulcer development Knowledge deficit related to management of pressures ulcers Potential for impaired tissue integrity related to pressure, friction, moisture, and shear Goals: Patient will remain free from development of additional pressure ulcers Atteberry, VERBON GIANGREGORIO ([id number]) Date Initiated: 06/28/2016 Target Resolution Date: 10/08/2016 Goal Status: Active Interventions: Assess: immobility, friction, shearing, incontinence upon admission and as needed Assess offloading mechanisms upon admission and as needed Notes: ` Wound/Skin Impairment Nursing Diagnoses: Impaired tissue integrity Knowledge deficit related to ulceration/compromised skin integrity Goals: Ulcer/skin breakdown will have a volume reduction of 80% by week 12 Date Initiated: 06/28/2016 Target Resolution Date: 10/01/2016 Goal Status: Active Interventions: Assess patient/caregiver ability to perform ulcer/skin care regimen upon admission and as needed Assess ulceration(s) every visit Notes: Electronic Signature(s) Signed: 09/27/2016 3:49:32 PM By: Montey Hora Entered By: Montey Hora on 09/27/2016 13:12:36 [place], [name] ([id number]) -------------------------------------------------------------------------------- Pain Assessment Details Patient Name: [name] Date of Service: 09/27/2016 1:30 PM Medical Record Number: [id number] Patient Account Number: [id number] Date of Birth/Sex: 1940/10/20 (76 y.o. Male) Treating RN: Montey Hora Primary Care Annel Zunker: Johny Drilling Other Clinician: Referring  Shaden Lacher: Johny Drilling Treating Sway Guttierrez/Extender: Tito Dine in Treatment: 13 Active Problems Location of Pain Severity and Description of Pain Patient Has Paino No Site  Locations Pain Management and Medication Current Pain Management: Electronic Signature(s) Signed: 09/27/2016 3:49:32 PM By: Montey Hora Entered By: Montey Hora on 09/27/2016 13:07:36 [place], [name] ([id number]) -------------------------------------------------------------------------------- Patient/Caregiver Education Details Patient Name: [name] Date of Service: 09/27/2016 1:30 PM Medical Record Patient Account Number: [id number] Number: Treating RN: Montey Hora 05-25-1940 (75 y.o. Other Clinician: Date of Birth/Gender: Male) Treating [place], [place] Primary Care Physician: Johny Drilling Physician/Extender: G Referring Physician: Nettie Elm in Treatment: 13 Education Assessment Education Provided To: Patient Education Topics Provided Wound/Skin Impairment: Handouts: Other: wound care as ordered Methods: Explain/Verbal Responses: State content correctly Electronic Signature(s) Signed: 09/27/2016 3:49:32 PM By: Montey Hora Entered By: Montey Hora on 09/27/2016 13:20:19 [name], [name] ([id number]) -------------------------------------------------------------------------------- Wound Assessment Details Patient Name: [name] Date of Service: 09/27/2016 1:30 PM Medical Record Number: [id number] Patient Account Number: [id number] Date of Birth/Sex: 21-Mar-1940 (76 y.o. Male) Treating RN: Montey Hora Primary Care Alvaro Aungst: Johny Drilling Other Clinician: Referring Blane Worthington: Johny Drilling Treating Areonna Bran/Extender: Ricard Dillon Weeks in Treatment: 13 Wound Status Wound Number: 1 Primary Etiology: Pressure Ulcer Wound Location: Right Sacrum Wound Status: Open Wounding Event: Pressure Injury Date Acquired: 06/14/2016 Weeks Of Treatment: 13 Clustered Wound: No Photos Photo Uploaded By: Montey Hora on 09/27/2016 15:43:49 Wound Measurements Length: (cm) 0.9 Width: (cm) 0.4 Depth: (cm) 0.4 Area: (cm)  0.283 Volume: (cm) 0.113 % Reduction in Area: 99.3% % Reduction in Volume: 98.5% Epithelialization: Small (1-33%) Tunneling: No Undermining: No Wound Description Classification: Category/Stage IV Wound Margin: Distinct, outline attached Exudate Amount: Large Exudate Type: Serous Exudate Color: amber Foul Odor After Cleansing: No Slough/Fibrino Yes Wound Bed Granulation Amount: Large (67-100%) Exposed Structure Granulation Quality: Red Fascia Exposed: No Necrotic Amount: Small (1-33%) Fat Layer (Subcutaneous Tissue) Exposed: Yes Necrotic Quality: Adherent Slough Tendon Exposed: No Cyphers, Gwin K. ([id number]) Muscle Exposed: Yes Necrosis of Muscle: Yes Joint Exposed: No Bone Exposed: No Periwound Skin Texture Texture Color No Abnormalities Noted: No No Abnormalities Noted: No Induration: Yes Erythema: Yes Erythema Location: Circumferential Moisture Rubor: Yes No Abnormalities Noted: No Dry / Scaly: Yes Temperature / Pain Temperature: No Abnormality Tenderness on Palpation: Yes Wound Preparation Ulcer Cleansing: Rinsed/Irrigated with Saline Topical Anesthetic Applied: Other: lidocaine 4%, Treatment Notes Wound #1 (Right Sacrum) 1. Cleansed with: Clean wound with Normal Saline 2. Anesthetic Topical Lidocaine 4% cream to wound bed prior to debridement 4. Dressing Applied: Other dressing (specify in notes) 5. Secondary Dressing Applied Bordered Foam Dressing Dry Gauze Electronic Signature(s) Signed: 09/27/2016 3:49:32 PM By: Montey Hora Entered By: Montey Hora on 09/27/2016 13:12:27 Kell, [name] ([id number]) -------------------------------------------------------------------------------- Vitals Details Patient Name: [name] Date of Service: 09/27/2016 1:30 PM Medical Record Number: [id number] Patient Account Number: [id number] Date of Birth/Sex: [birth year] (76 y.o. Male) Treating RN: Montey Hora Primary Care Bricia Taher: Johny Drilling Other Clinician: Referring Snyder Colavito: Johny Drilling Treating Yanina Knupp/Extender: Tito Dine in Treatment: 13 Vital Signs Time Taken: 13:08 Temperature (F): 98.4 Height (in): 69 Pulse (bpm): 67 Weight (lbs): 147.5 Respiratory Rate (breaths/min): 16 Body Mass Index (BMI): 21.8 Blood Pressure (mmHg): 105/61 Reference Range: 80 - 120 mg / dl Electronic Signature(s) Signed: 09/27/2016 3:49:32 PM By: Montey Hora Entered By: Montey Hora on 09/27/2016 13:08:00

## 2016-10-04 ENCOUNTER — Encounter: Payer: Medicare Other | Attending: Internal Medicine | Admitting: Internal Medicine

## 2016-10-04 DIAGNOSIS — X58XXXD Exposure to other specified factors, subsequent encounter: Secondary | ICD-10-CM | POA: Insufficient documentation

## 2016-10-04 DIAGNOSIS — L89150 Pressure ulcer of sacral region, unstageable: Secondary | ICD-10-CM | POA: Diagnosis not present

## 2016-10-04 DIAGNOSIS — E11622 Type 2 diabetes mellitus with other skin ulcer: Secondary | ICD-10-CM | POA: Diagnosis present

## 2016-10-04 DIAGNOSIS — S91011D Laceration without foreign body, right ankle, subsequent encounter: Secondary | ICD-10-CM | POA: Diagnosis not present

## 2016-10-04 DIAGNOSIS — S21219D Laceration without foreign body of unspecified back wall of thorax without penetration into thoracic cavity, subsequent encounter: Secondary | ICD-10-CM | POA: Insufficient documentation

## 2016-10-06 NOTE — Progress Notes (Signed)
[name], [name] ([id number]) Visit Report for 10/04/2016 HPI Details Patient Name: [name], [name] Date of Service: 10/04/2016 2:30 PM Medical Record Patient Account Number: [id number] Number: Treating RN: [name] Nov 04, 1940 (75 y.o. Other Clinician: Date of Birth/Sex: Male) Treating [name] Primary Care Provider: Johny [name] Provider/Extender: G Referring Provider: Nettie [name] in Treatment: 14 History of Present Illness HPI Description: 06/28/16; this is a 76 year old man who was admitted to hospital from 6/13 through 06/18/16 at [facility] after being found down in a closet in his home. It is not exactly clear how long he was actually there although it was probably for days. His total CK maximum was at 2582. He was admitted with delirium, possibly heat related illness, possible subendocardial MI. An MRI of the brain was negative. During this hospitalization he was noted to have areas of pressure-related injury for the time he was spent on the floor on his back he was stated to have a stage II pressure ulcer over his buttocks. Multiple excoriations were also noted. He has advanced home care about doing physical therapy there requesting a nurse which seems reasonable. The patient is a diabetic on metformin. They're using bacitracin to all these areas. He was prescribed doxycycline which I believe he is still on. 07/05/16; patient with pressure ulcers on his right greater than left buttock surrounding his coccyx. Excoriation over the right lateral ankle an excoriation on his upper back. They did not get any Santyl or medihoney. We [name] take care of this today. 07/12/16 unfortunately patient's wounds appeared to be doing better across the board and the eschar that is overlying the sacral wound has loosened up to where I could debride somewhat today. There's no evidence of 07/20/16; butterfly shaped wound across the sacrum and the surrounding skin and  soft tissue. The area on the right is much larger than the left. The area on the left looks healthy on the right still and necrotic nonviable surface. Using Santyl 07/26/16; butterfly shaped wound across the sacrum and surrounding skin and soft tissue. The area on the right much larger than the left. Extensive debridement on the right last week post-debridement culture showed enterococcus faecalis. This is ampicillin sensitive and he'll definitely need Augmentin. We have been using Santyl.[name] improvement this week 08/02/16; butterfly shaped wound across the sacrum and surrounding skin and soft tissue. The area on the left is just about closed the area on the right has settled into a stage III wound initially unstageable on arrival. This [name] slow bleed ongoing debridement both mechanically and enzymatically although we are making good progress each week. Using Santyl/moist gauze/border foam 08/09/16 on evaluation today patient presents with a slough covered sacral wound but he tells me he experiences some mild discomfort regarding. This pain seems to be worse mainly because cleansing and he rates this to be a 2 out of 10. With that being said he does seem to be unfortunately after last week's debridement she states that he had a snack a lot of bleeding upon arrival at home making some progress with the Santyl at this point. His daughter actually does the dressing changes for him daily. Wish they had not noted up until that point. Otherwise things have been going well in regard to treatment. Patient has no [name], Charles K. ([id number]) nausea, vomiting, or diarrhea as well as no fever or chills. 08/16/16 on evaluation today patient appears to be doing well in regard to his sacral ulcer. He is the dressing  changes. The only concern that has been made and brought up by his daughter at this point is that the dressings are being sent from the supply company only have a 2 x 2 pad and the wound is larger  than 2 x 2 in dimension. She therefore seven difficulty getting the dressing to seal appropriately without leaking or causing other issues. Obviously this has been frustrating. Nonetheless otherwise his wounds seems to be doing well. No fevers, chills, nausea, or vomiting noted at this time. 08/23/16; the patient continues with Santyl as the primary dressing and in the 3 weeks since I have seen this he has made remarkable improvement. His daughter is changing the dressing. 08/30/16; patient continues to make good improvement with Santyl. Wound is smaller. This is on the right buttock side of what was originally a butterfly shaped wound. 09/06/16; patient continues to make improvement in wound dimensions using Santyl. 09/13/16; wound dimensions are improved still using santyl as the primary dressing 09/27/16; somewhere around 0.9 cm in depth of the wound today which is deeper than last time followed although it is a small reminiscent of a much larger wound. Had been using Santyl today but this [name] need to change today. 10/04/16; that seems better today. Switch to Endoform last week. This is a small remaining wound of the much larger stage IV pressure area had on presentation Electronic Signature(s) Signed: 10/05/2016 8:40:51 AM By: Linton Ham MD Entered By: Linton Ham on 10/04/2016 15:42:34 [name], [name] ([id number]) -------------------------------------------------------------------------------- Physical Exam Details Patient Name: [name] Date of Service: 10/04/2016 2:30 PM Medical Record Patient Account Number: [id number] Number: Treating RN: [name] 20-Jul-1940 (75 y.o. Other Clinician: Date of Birth/Sex: Male) Treating [name] Primary Care Provider: Johny [name] Provider/Extender: G Referring Provider: Nettie [name] in Treatment: 14 Constitutional Sitting or standing Blood Pressure is within target range for patient.. Pulse regular  and within target range for patient.Marland Kitchen Respirations regular, non-labored and within target range.. Temperature is normal and within the target range for the patient.Marland Kitchen appears in no distress. Notes Wound exam; this is a small wound remaining on the right buttock which is the reminiscent of his original large butterfly shaped pressure area. Depth down to measured 0.2 although I think this is probably a little deeper than this. There is no evidence of surrounding infection Electronic Signature(s) Signed: 10/05/2016 8:40:51 AM By: Linton Ham MD Entered By: Linton Ham on 10/04/2016 15:43:44 Hartnett, [name] ([id number]) -------------------------------------------------------------------------------- Physician Orders Details Patient Name: [name] Date of Service: 10/04/2016 2:30 PM Medical Record Patient Account Number: [id number] Number: Treating RN: [name] May 29, 1940 (75 y.o. Other Clinician: Date of Birth/Sex: Male) Treating Ademide Schaberg Primary Care Provider: Johny [name] Provider/Extender: G Referring Provider: Nettie [name] in Treatment: 14 Verbal / Phone Orders: Yes Clinician: Montey [name] Read Back and Verified: Yes Diagnosis Coding Wound Cleansing Wound #1 Right Sacrum o Cleanse wound with mild soap and water Primary Wound Dressing Wound #1 Right Sacrum o Other: - endoform Secondary Dressing Wound #1 Right Sacrum o Dry Gauze o Boardered Foam Dressing Dressing Change Frequency Wound #1 Right Sacrum o Change dressing every day. Follow-up Appointments Wound #1 Right Sacrum o Return Appointment in 1 week. Off-Loading Wound #1 Right Sacrum o Turn and reposition every 2 hours Electronic Signature(s) Signed: 10/04/2016 5:06:38 PM By: [name] Signed: 10/05/2016 8:40:51 AM By: Linton Ham MD Entered By: [name] on 10/04/2016 14:51:58 Mehaffey, [name]  ([id number]) -------------------------------------------------------------------------------- Problem List Details Patient  Name: Kenan, [name] Date of Service: 10/04/2016 2:30 PM Medical Record Patient Account Number: [id number] Number: Treating RN: [name] 08/01/1940 (75 y.o. Other Clinician: Date of Birth/Sex: Male) Treating Tyrez Berrios Primary Care Provider: Johny [name] Provider/Extender: G Referring Provider: Nettie [name] in Treatment: 14 Active Problems ICD-10 Encounter Code Description Active Date Diagnosis L89.150 Pressure ulcer of sacral region, unstageable 06/28/2016 Yes S91.011D Laceration without foreign body, right ankle, subsequent 06/28/2016 Yes encounter S21.219D Laceration without foreign body of unspecified back wall 06/28/2016 Yes of thorax without penetration into thoracic cavity, subsequent encounter E11.622 Type 2 diabetes mellitus with other skin ulcer 06/28/2016 Yes Inactive Problems Resolved Problems Electronic Signature(s) Signed: 10/05/2016 8:40:51 AM By: Linton Ham MD Entered By: Linton Ham on 10/04/2016 15:41:19 [place], [name] ([id number]) -------------------------------------------------------------------------------- Progress Note Details Patient Name: [name] Date of Service: 10/04/2016 2:30 PM Medical Record Patient Account Number: [id number] Number: Treating RN: [name] 07-Aug-1940 (75 y.o. Other Clinician: Date of Birth/Sex: Male) Treating Laporcha Marchesi Primary Care Provider: Johny [name] Provider/Extender: G Referring Provider: Nettie [name] in Treatment: 14 Subjective History of Present Illness (HPI) 06/28/16; this is a 76 year old man who was admitted to hospital from 6/13 through 06/18/16 at [facility] after being found down in a closet in his home. It is not exactly clear how long he was actually there although it was probably for days. His total CK  maximum was at 2582. He was admitted with delirium, possibly heat related illness, possible subendocardial MI. An MRI of the brain was negative. During this hospitalization he was noted to have areas of pressure-related injury for the time he was spent on the floor on his back he was stated to have a stage II pressure ulcer over his buttocks. Multiple excoriations were also noted. He has advanced home care about doing physical therapy there requesting a nurse which seems reasonable. The patient is a diabetic on metformin. They're using bacitracin to all these areas. He was prescribed doxycycline which I believe he is still on. 07/05/16; patient with pressure ulcers on his right greater than left buttock surrounding his coccyx. Excoriation over the right lateral ankle an excoriation on his upper back. They did not get any Santyl or medihoney. We [name] take care of this today. 07/12/16 unfortunately patient's wounds appeared to be doing better across the board and the eschar that is overlying the sacral wound has loosened up to where I could debride somewhat today. There's no evidence of 07/20/16; butterfly shaped wound across the sacrum and the surrounding skin and soft tissue. The area on the right is much larger than the left. The area on the left looks healthy on the right still and necrotic nonviable surface. Using Santyl 07/26/16; butterfly shaped wound across the sacrum and surrounding skin and soft tissue. The area on the right much larger than the left. Extensive debridement on the right last week post-debridement culture showed enterococcus faecalis. This is ampicillin sensitive and he'll definitely need Augmentin. We have been using Santyl.[name] improvement this week 08/02/16; butterfly shaped wound across the sacrum and surrounding skin and soft tissue. The area on the left is just about closed the area on the right has settled into a stage III wound initially unstageable on arrival. This  [name] slow bleed ongoing debridement both mechanically and enzymatically although we are making good progress each week. Using Santyl/moist gauze/border foam 08/09/16 on evaluation today patient presents with a slough covered sacral wound but he tells me he experiences  some mild discomfort regarding. This pain seems to be worse mainly because cleansing and he rates this to be a 2 out of 10. With that being said he does seem to be unfortunately after last week's debridement she states that he had a snack a lot of bleeding upon arrival at home making some progress with the Santyl at this point. His daughter actually does the dressing changes for him daily. Wish they had not noted up until that point. Otherwise things have been going well in regard to treatment. Patient has no nausea, vomiting, or diarrhea as well as no fever or chills. 08/16/16 on evaluation today patient appears to be doing well in regard to his sacral ulcer. He is the dressing [name], Charles K. ([id number]) changes. The only concern that has been made and brought up by his daughter at this point is that the dressings are being sent from the supply company only have a 2 x 2 pad and the wound is larger than 2 x 2 in dimension. She therefore seven difficulty getting the dressing to seal appropriately without leaking or causing other issues. Obviously this has been frustrating. Nonetheless otherwise his wounds seems to be doing well. No fevers, chills, nausea, or vomiting noted at this time. 08/23/16; the patient continues with Santyl as the primary dressing and in the 3 weeks since I have seen this he has made remarkable improvement. His daughter is changing the dressing. 08/30/16; patient continues to make good improvement with Santyl. Wound is smaller. This is on the right buttock side of what was originally a butterfly shaped wound. 09/06/16; patient continues to make improvement in wound dimensions using Santyl. 09/13/16; wound  dimensions are improved still using santyl as the primary dressing 09/27/16; somewhere around 0.9 cm in depth of the wound today which is deeper than last time followed although it is a small reminiscent of a much larger wound. Had been using Santyl today but this [name] need to change today. 10/04/16; that seems better today. Switch to Endoform last week. This is a small remaining wound of the much larger stage IV pressure area had on presentation Objective Constitutional Sitting or standing Blood Pressure is within target range for patient.. Pulse regular and within target range for patient.Marland Kitchen Respirations regular, non-labored and within target range.. Temperature is normal and within the target range for the patient.Marland Kitchen appears in no distress. Vitals Time Taken: 2:21 PM, Height: 69 in, Weight: 147.5 lbs, BMI: 21.8, Temperature: 98.3 F, Pulse: 59 bpm, Respiratory Rate: 18 breaths/min, Blood Pressure: 105/59 mmHg. General Notes: Wound exam; this is a small wound remaining on the right buttock which is the reminiscent of his original large butterfly shaped pressure area. Depth down to measured 0.2 although I think this is probably a little deeper than this. There is no evidence of surrounding infection Integumentary (Hair, Skin) Wound #1 status is Open. Original cause of wound was Pressure Injury. The wound is located on the Right Sacrum. The wound measures 0.5cm length x 0.4cm width x 0.2cm depth; 0.157cm^2 area and 0.031cm^3 volume. There is muscle and Fat Layer (Subcutaneous Tissue) Exposed exposed. There is no tunneling or undermining noted. There is a large amount of serous drainage noted. The wound margin is distinct with the outline attached to the wound base. There is large (67-100%) red granulation within the wound bed. There is a small (1-33%) amount of necrotic tissue within the wound bed including Adherent Slough. The periwound skin appearance exhibited: Induration, Dry/Scaly, Rubor,  Erythema. The surrounding  wound skin color is noted with erythema which is circumferential. Periwound temperature was noted as No Abnormality. [name], Charles K. ([id number]) The periwound has tenderness on palpation. Assessment Active Problems ICD-10 L89.150 - Pressure ulcer of sacral region, unstageable S91.011D - Laceration without foreign body, right ankle, subsequent encounter S21.219D - Laceration without foreign body of unspecified back wall of thorax without penetration into thoracic cavity, subsequent encounter E11.622 - Type 2 diabetes mellitus with other skin ulcer Plan Wound Cleansing: Wound #1 Right Sacrum: Cleanse wound with mild soap and water Primary Wound Dressing: Wound #1 Right Sacrum: Other: - endoform Secondary Dressing: Wound #1 Right Sacrum: Dry Gauze Boardered Foam Dressing Dressing Change Frequency: Wound #1 Right Sacrum: Change dressing every day. Follow-up Appointments: Wound #1 Right Sacrum: Return Appointment in 1 week. Off-Loading: Wound #1 Right Sacrum: Turn and reposition every 2 hours we are going to contineu with endorom and border foam [name], Charles K. ([id number]) careful attention to depth next week Electronic Signature(s) Signed: 10/05/2016 8:40:51 AM By: Linton Ham MD Entered By: Linton Ham on 10/04/2016 15:45:53 [name], [name] ([id number]) -------------------------------------------------------------------------------- SuperBill Details Patient Name: [name] Date of Service: 10/04/2016 Medical Record Patient Account Number: [id number] Number: Treating RN: [name] 09/25/1940 (75 y.o. Other Clinician: Date of Birth/Sex: Male) Treating Harmonee Tozer Primary Care Provider: Johny [name] Provider/Extender: G Referring Provider: Nettie [name] in Treatment: 14 Diagnosis Coding ICD-10 Codes Code Description L89.150 Pressure ulcer of sacral region, unstageable S91.011D Laceration without  foreign body, right ankle, subsequent encounter Laceration without foreign body of unspecified back wall of thorax without penetration into S21.219D thoracic cavity, subsequent encounter E11.622 Type 2 diabetes mellitus with other skin ulcer Facility Procedures CPT4 Code: [id number] Description: [phone] - WOUND CARE VISIT-LEV 2 EST PT Modifier: Quantity: 1 Physician Procedures CPT4 Code: [id number] Description: 88916 - WC PHYS LEVEL 2 - EST PT ICD-10 Description Diagnosis L89.150 Pressure ulcer of sacral region, unstageable Modifier: Quantity: 1 Electronic Signature(s) Signed: 10/05/2016 4:23:55 PM By: Linton Ham MD Signed: 10/05/2016 4:26:30 PM By: [name] Previous Signature: 10/05/2016 8:40:51 AM Version By: Linton Ham MD Entered By: [name] on 10/05/2016 13:57:17

## 2016-10-06 NOTE — Progress Notes (Signed)
TORI, CUPPS ([id number]) Visit Report for 10/04/2016 Arrival Information Details Patient Name: [name], [name] Date of Service: 10/04/2016 2:30 PM Medical Record Number: [id number] Patient Account Number: [id number] Date of Birth/Sex: Jan 24, 1940 (75 y.o. Male) Treating RN: Montey Hora Primary Care Ronny Ruddell: Johny Drilling Other Clinician: Referring Armie Moren: Johny Drilling Treating Braylei Totino/Extender: Tito Dine in Treatment: 14 Visit Information History Since Last Visit Added or deleted any medications: No Patient Arrived: Ambulatory Any new allergies or adverse reactions: No Arrival Time: 14:20 Had a fall or experienced change in No Accompanied By: self activities of daily living that may affect Transfer Assistance: None risk of falls: Patient Identification Verified: Yes Signs or symptoms of abuse/neglect since last No Secondary Verification Process Yes visito Completed: Hospitalized since last visit: No Patient Requires Transmission-Based No Has Dressing in Place as Prescribed: Yes Precautions: Pain Present Now: No Patient Has Alerts: Yes Patient Alerts: DM II Electronic Signature(s) Signed: 10/04/2016 5:06:38 PM By: Montey Hora Entered By: Montey Hora on 10/04/2016 14:21:15 [place], [name] ([id number]) -------------------------------------------------------------------------------- Clinic Level of Care Assessment Details Patient Name: [name] Date of Service: 10/04/2016 2:30 PM Medical Record Number: [id number] Patient Account Number: [id number] Date of Birth/Sex: [birth year] (75 y.o. Male) Treating RN: Montey Hora Primary Care Kc Summerson: Johny Drilling Other Clinician: Referring Celisa Schoenberg: Johny Drilling Treating Dawson Hollman/Extender: Tito Dine in Treatment: 14 Clinic Level of Care Assessment Items TOOL 4 Quantity Score []  - Use when only an EandM is performed on FOLLOW-UP visit 0 ASSESSMENTS - Nursing Assessment /  Reassessment X - Reassessment of Co-morbidities (includes updates in patient status) 1 10 X - Reassessment of Adherence to Treatment Plan 1 5 ASSESSMENTS - Wound and Skin Assessment / Reassessment X - Simple Wound Assessment / Reassessment - one wound 1 5 []  - Complex Wound Assessment / Reassessment - multiple wounds 0 []  - Dermatologic / Skin Assessment (not related to wound area) 0 ASSESSMENTS - Focused Assessment []  - Circumferential Edema Measurements - multi extremities 0 []  - Nutritional Assessment / Counseling / Intervention 0 []  - Lower Extremity Assessment (monofilament, tuning fork, pulses) 0 []  - Peripheral Arterial Disease Assessment (using hand held doppler) 0 ASSESSMENTS - Ostomy and/or Continence Assessment and Care []  - Incontinence Assessment and Management 0 []  - Ostomy Care Assessment and Management (repouching, etc.) 0 PROCESS - Coordination of Care X - Simple Patient / Family Education for ongoing care 1 15 []  - Complex (extensive) Patient / Family Education for ongoing care 0 []  - Staff obtains Programmer, systems, Records, Test Results / Process Orders 0 []  - Staff telephones HHA, Nursing Homes / Clarify orders / etc 0 []  - Routine Transfer to another Facility (non-emergent condition) 0 Benally, Issa K. ([id number]) []  - Routine Hospital Admission (non-emergent condition) 0 []  - New Admissions / Biomedical engineer / Ordering NPWT, Apligraf, etc. 0 []  - Emergency Hospital Admission (emergent condition) 0 X - Simple Discharge Coordination 1 10 []  - Complex (extensive) Discharge Coordination 0 PROCESS - Special Needs []  - Pediatric / Minor Patient Management 0 []  - Isolation Patient Management 0 []  - Hearing / Language / Visual special needs 0 []  - Assessment of Community assistance (transportation, D/C planning, etc.) 0 []  - Additional assistance / Altered mentation 0 []  - Support Surface(s) Assessment (bed, cushion, seat, etc.) 0 INTERVENTIONS - Wound Cleansing /  Measurement X - Simple Wound Cleansing - one wound 1 5 []  - Complex Wound Cleansing - multiple wounds 0 X - Wound Imaging (photographs - any number of  wounds) 1 5 []  - Wound Tracing (instead of photographs) 0 X - Simple Wound Measurement - one wound 1 5 []  - Complex Wound Measurement - multiple wounds 0 INTERVENTIONS - Wound Dressings X - Small Wound Dressing one or multiple wounds 1 10 []  - Medium Wound Dressing one or multiple wounds 0 []  - Large Wound Dressing one or multiple wounds 0 []  - Application of Medications - topical 0 []  - Application of Medications - injection 0 INTERVENTIONS - Miscellaneous []  - External ear exam 0 Friebel, Lyell K. ([id number]) []  - Specimen Collection (cultures, biopsies, blood, body fluids, etc.) 0 []  - Specimen(s) / Culture(s) sent or taken to Lab for analysis 0 []  - Patient Transfer (multiple staff / Harrel Lemon Lift / Similar devices) 0 []  - Simple Staple / Suture removal (25 or less) 0 []  - Complex Staple / Suture removal (26 or more) 0 []  - Hypo / Hyperglycemic Management (close monitor of Blood Glucose) 0 []  - Ankle / Brachial Index (ABI) - do not check if billed separately 0 X - Vital Signs 1 5 Has the patient been seen at the hospital within the last three years: Yes Total Score: 75 Level Of Care: New/Established - Level 2 Electronic Signature(s) Signed: 10/05/2016 4:26:30 PM By: Montey Hora Entered By: Montey Hora on 10/05/2016 13:57:07 [place], [name] ([id number]) -------------------------------------------------------------------------------- Encounter Discharge Information Details Patient Name: [name] Date of Service: 10/04/2016 2:30 PM Medical Record Number: [id number] Patient Account Number: [id number] Date of Birth/Sex: Feb 06, 1940 (76 y.o. Male) Treating RN: Montey Hora Primary Care Ailie Gage: Johny Drilling Other Clinician: Referring Mea Ozga: Johny Drilling Treating Dacen Frayre/Extender: Tito Dine in  Treatment: 14 Encounter Discharge Information Items Discharge Pain Level: 0 Discharge Condition: Stable Ambulatory Status: Ambulatory Discharge Destination: Home Transportation: Private Auto Accompanied By: daughter Schedule Follow-up Appointment: Yes Medication Reconciliation completed and provided to Patient/Care No [place]: Provided on Clinical Summary of Care: 10/04/2016 Form Type Recipient Paper Patient LM Electronic Signature(s) Signed: 10/05/2016 2:27:26 PM By: Ruthine Dose Entered By: Ruthine Dose on 10/04/2016 15:02:58 [place], [name] ([id number]) -------------------------------------------------------------------------------- Multi Wound Chart Details Patient Name: [name] Date of Service: 10/04/2016 2:30 PM Medical Record Number: [id number] Patient Account Number: [id number] Date of Birth/Sex: 06-19-1940 (76 y.o. Male) Treating RN: Montey Hora Primary Care Montie Gelardi: Johny Drilling Other Clinician: Referring Hanad Leino: Johny Drilling Treating Tayjon Halladay/Extender: Ricard Dillon Weeks in Treatment: 14 Vital Signs Height(in): 69 Pulse(bpm): 59 Weight(lbs): 147.5 Blood Pressure 105/59 (mmHg): Body Mass Index(BMI): 22 Temperature(F): 98.3 Respiratory Rate 18 (breaths/min): Photos: [1:No Photos] [N/A:N/A] Wound Location: [1:Right Sacrum] [N/A:N/A] Wounding Event: [1:Pressure Injury] [N/A:N/A] Primary Etiology: [1:Pressure Ulcer] [N/A:N/A] Date Acquired: [1:06/14/2016] [N/A:N/A] Weeks of Treatment: [1:14] [N/A:N/A] Wound Status: [1:Open] [N/A:N/A] Measurements L x W x D 0.5x0.4x0.2 [N/A:N/A] (cm) Area (cm) : [1:0.157] [N/A:N/A] Volume (cm) : [1:0.031] [N/A:N/A] % Reduction in Area: [1:99.60%] [N/A:N/A] % Reduction in Volume: 99.60% [N/A:N/A] Classification: [1:Category/Stage IV] [N/A:N/A] Exudate Amount: [1:Large] [N/A:N/A] Exudate Type: [1:Serous] [N/A:N/A] Exudate Color: [1:amber] [N/A:N/A] Wound Margin: [1:Distinct, outline attached]  [N/A:N/A] Granulation Amount: [1:Large (67-100%)] [N/A:N/A] Granulation Quality: [1:Red] [N/A:N/A] Necrotic Amount: [1:Small (1-33%)] [N/A:N/A] Exposed Structures: [1:Fat Layer (Subcutaneous Tissue) Exposed: Yes Muscle: Yes Fascia: No Tendon: No Joint: No Bone: No] [N/A:N/A] Epithelialization: [1:Small (1-33%)] [N/A:N/A] Periwound Skin Texture: Induration: Yes [N/A:N/A] Periwound Skin Dry/Scaly: Yes N/A N/A Moisture: Periwound Skin Color: Erythema: Yes N/A N/A Rubor: Yes Erythema Location: Circumferential N/A N/A Temperature: No Abnormality N/A N/A Tenderness on Yes N/A N/A Palpation: Wound Preparation: Ulcer Cleansing: N/A N/A Rinsed/Irrigated  with Saline Topical Anesthetic Applied: Other: lidocaine 4% Treatment Notes Wound #1 (Right Sacrum) 1. Cleansed with: Clean wound with Normal Saline 4. Dressing Applied: Other dressing (specify in notes) 5. Secondary Dressing Applied Bordered Foam Dressing Dry Gauze Notes endoform Electronic Signature(s) Signed: 10/05/2016 8:40:51 AM By: Linton Ham MD Entered By: Linton Ham on 10/04/2016 15:41:26 [place], [name] ([id number]) -------------------------------------------------------------------------------- [place] Details Patient Name: [name] Date of Service: 10/04/2016 2:30 PM Medical Record Number: [id number] Patient Account Number: [id number] Date of Birth/Sex: 08/04/1940 (76 y.o. Male) Treating RN: Montey Hora Primary Care Aleem Elza: Johny Drilling Other Clinician: Referring Areli Frary: Johny Drilling Treating Nasteho Glantz/Extender: Tito Dine in Treatment: 14 Active Inactive ` Abuse / Safety / Falls / Self Care Management Nursing Diagnoses: Potential for falls Goals: Patient will not experience any injury related to falls Date Initiated: 06/28/2016 Target Resolution Date: 10/08/2016 Goal Status: Active Interventions: Assess Activities of Daily Living upon admission  and as needed Assess: immobility, friction, shearing, incontinence upon admission and as needed Assess impairment of mobility on admission and as needed per policy Notes: ` Nutrition Nursing Diagnoses: Imbalanced nutrition Impaired glucose control: actual or potential Potential for alteratiion in Nutrition/Potential for imbalanced nutrition Goals: Patient/caregiver agrees to and verbalizes understanding of need to use nutritional supplements and/or vitamins as prescribed Date Initiated: 06/28/2016 Target Resolution Date: 09/10/2016 Goal Status: Active Patient/caregiver verbalizes understanding of need to maintain therapeutic glucose control per primary care physician Date Initiated: 06/28/2016 Target Resolution Date: 09/10/2016 Goal Status: Active Interventions: MCADAMSJaylan, Hinojosa ([id number]) Assess patient nutrition upon admission and as needed per policy Notes: ` Orientation to the Wound Care Program Nursing Diagnoses: Knowledge deficit related to the wound healing center program Goals: Patient/caregiver will verbalize understanding of the [facility] Date Initiated: 06/28/2016 Target Resolution Date: 07/09/2016 Goal Status: Active Interventions: Provide education on orientation to the wound center Notes: ` Pain, Acute or Chronic Nursing Diagnoses: Pain, acute or chronic: actual or potential Potential alteration in comfort, pain Goals: Patient/caregiver will verbalize adequate pain control between visits Date Initiated: 06/28/2016 Target Resolution Date: 10/08/2016 Goal Status: Active Interventions: Assess comfort goal upon admission Complete pain assessment as per visit requirements Notes: ` Pressure Nursing Diagnoses: Knowledge deficit related to causes and risk factors for pressure ulcer development Knowledge deficit related to management of pressures ulcers Potential for impaired tissue integrity related to pressure, friction, moisture, and  shear Goals: Patient will remain free from development of additional pressure ulcers [name], [name] ([id number]) Date Initiated: 06/28/2016 Target Resolution Date: 10/08/2016 Goal Status: Active Interventions: Assess: immobility, friction, shearing, incontinence upon admission and as needed Assess offloading mechanisms upon admission and as needed Notes: ` Wound/Skin Impairment Nursing Diagnoses: Impaired tissue integrity Knowledge deficit related to ulceration/compromised skin integrity Goals: Ulcer/skin breakdown will have a volume reduction of 80% by week 12 Date Initiated: 06/28/2016 Target Resolution Date: 10/01/2016 Goal Status: Active Interventions: Assess patient/caregiver ability to perform ulcer/skin care regimen upon admission and as needed Assess ulceration(s) every visit Notes: Electronic Signature(s) Signed: 10/04/2016 5:06:38 PM By: Montey Hora Entered By: Montey Hora on 10/04/2016 14:26:08 [place], [name] ([id number]) -------------------------------------------------------------------------------- Pain Assessment Details Patient Name: [name] Date of Service: 10/04/2016 2:30 PM Medical Record Number: [id number] Patient Account Number: [id number] Date of Birth/Sex: [birth year] (76 y.o. Male) Treating RN: Montey Hora Primary Care Jovaun Levene: Johny Drilling Other Clinician: Referring Maritssa Haughton: Johny Drilling Treating Copeland Lapier/Extender: Ricard Dillon Weeks in Treatment: 14 Active Problems Location of Pain Severity and Description of Pain Patient  Has Paino No Site Locations Pain Management and Medication Current Pain Management: Electronic Signature(s) Signed: 10/04/2016 5:06:38 PM By: Montey Hora Entered By: Montey Hora on 10/04/2016 14:21:22 [place], [name] ([id number]) -------------------------------------------------------------------------------- Patient/Caregiver Education Details Patient Name: [name] Date of  Service: 10/04/2016 2:30 PM Medical Record Patient Account Number: [id number] Number: Treating RN: Montey Hora 06-Jan-1940 (75 y.o. Other Clinician: Date of Birth/Gender: Male) Treating ROBSON, [place] Primary Care Physician: Johny Drilling Physician/Extender: G Referring Physician: Nettie Elm in Treatment: 14 Education Assessment Education Provided To: Patient Education Topics Provided Wound/Skin Impairment: Handouts: Other: change dressing as ordered Methods: Demonstration, Explain/Verbal Responses: State content correctly Electronic Signature(s) Signed: 10/04/2016 5:06:38 PM By: Montey Hora Entered By: Montey Hora on 10/04/2016 14:53:09 [place], [name] ([id number]) -------------------------------------------------------------------------------- Wound Assessment Details Patient Name: [name] Date of Service: 10/04/2016 2:30 PM Medical Record Number: [id number] Patient Account Number: [id number] Date of Birth/Sex: Apr 16, 1940 (76 y.o. Male) Treating RN: Montey Hora Primary Care Jinelle Butchko: Johny Drilling Other Clinician: Referring Ashani Pumphrey: Johny Drilling Treating Presly Steinruck/Extender: Ricard Dillon Weeks in Treatment: 14 Wound Status Wound Number: 1 Primary Etiology: Pressure Ulcer Wound Location: Right Sacrum Wound Status: Open Wounding Event: Pressure Injury Date Acquired: 06/14/2016 Weeks Of Treatment: 14 Clustered Wound: No Photos Photo Uploaded By: Montey Hora on 10/04/2016 16:53:02 Wound Measurements Length: (cm) 0.5 Width: (cm) 0.4 Depth: (cm) 0.2 Area: (cm) 0.157 Volume: (cm) 0.031 % Reduction in Area: 99.6% % Reduction in Volume: 99.6% Epithelialization: Small (1-33%) Tunneling: No Undermining: No Wound Description Classification: Category/Stage IV Wound Margin: Distinct, outline attached Exudate Amount: Large Exudate Type: Serous Exudate Color: amber Foul Odor After Cleansing: No Slough/Fibrino  Yes Wound Bed Granulation Amount: Large (67-100%) Exposed Structure Granulation Quality: Red Fascia Exposed: No Necrotic Amount: Small (1-33%) Fat Layer (Subcutaneous Tissue) Exposed: Yes Necrotic Quality: Adherent Slough Tendon Exposed: No [place], Teigen K. ([id number]) Muscle Exposed: Yes Necrosis of Muscle: No Joint Exposed: No Bone Exposed: No Periwound Skin Texture Texture Color No Abnormalities Noted: No No Abnormalities Noted: No Induration: Yes Erythema: Yes Erythema Location: Circumferential Moisture Rubor: Yes No Abnormalities Noted: No Dry / Scaly: Yes Temperature / Pain Temperature: No Abnormality Tenderness on Palpation: Yes Wound Preparation Ulcer Cleansing: Rinsed/Irrigated with Saline Topical Anesthetic Applied: Other: lidocaine 4%, Treatment Notes Wound #1 (Right Sacrum) 1. Cleansed with: Clean wound with Normal Saline 4. Dressing Applied: Other dressing (specify in notes) 5. Secondary Dressing Applied Bordered Foam Dressing Dry Gauze Notes endoform Electronic Signature(s) Signed: 10/04/2016 5:06:38 PM By: Montey Hora Entered By: Montey Hora on 10/04/2016 14:25:59 [place], [name] ([id number]) -------------------------------------------------------------------------------- [place] Details Patient Name: [name] Date of Service: 10/04/2016 2:30 PM Medical Record Number: [id number] Patient Account Number: [id number] Date of Birth/Sex: 1940/03/21 (76 y.o. Male) Treating RN: Montey Hora Primary Care Jaskirat Schwieger: Johny Drilling Other Clinician: Referring Brylen Wagar: Johny Drilling Treating Edith Lord/Extender: Tito Dine in Treatment: 14 Vital Signs Time Taken: 14:21 Temperature (F): 98.3 Height (in): 69 Pulse (bpm): 59 Weight (lbs): 147.5 Respiratory Rate (breaths/min): 18 Body Mass Index (BMI): 21.8 Blood Pressure (mmHg): 105/59 Reference Range: 80 - 120 mg / dl Electronic Signature(s) Signed: 10/04/2016 5:06:38 PM  By: Montey Hora Entered By: Montey Hora on 10/04/2016 14:22:26

## 2016-10-11 ENCOUNTER — Encounter: Payer: Medicare Other | Admitting: Internal Medicine

## 2016-10-11 DIAGNOSIS — E11622 Type 2 diabetes mellitus with other skin ulcer: Secondary | ICD-10-CM | POA: Diagnosis not present

## 2016-10-12 NOTE — Progress Notes (Signed)
Charles, [name] ([id number]) Visit Report for 10/11/2016 HPI Details Patient Name: [name], [name] Date of Service: 10/11/2016 3:15 PM Medical Record Patient Account Number: [id number] Number: Treating RN: Montey Hora 03-Nov-1940 (75 y.o. Other Clinician: Date of Birth/Sex: Male) Treating Kaylib Furness Primary Care Provider: Johny Drilling Provider/Extender: G Referring Provider: Nettie Elm in Treatment: 15 History of Present Illness HPI Description: 06/28/16; this is a 76 year old man who was admitted to hospital from 6/13 through 06/18/16 at [facility] after being found down in a closet in his home. It is not exactly clear how long he was actually there although it was probably for days. His total CK maximum was at 2582. He was admitted with delirium, possibly heat related illness, possible subendocardial MI. An MRI of the brain was negative. During this hospitalization he was noted to have areas of pressure-related injury for the time he was spent on the floor on his back he was stated to have a stage II pressure ulcer over his buttocks. Multiple excoriations were also noted. He has advanced home care about doing physical therapy there requesting a nurse which seems reasonable. The patient is a diabetic on metformin. They're using bacitracin to all these areas. He was prescribed doxycycline which I believe he is still on. 07/05/16; patient with pressure ulcers on his right greater than left buttock surrounding his coccyx. Excoriation over the right lateral ankle an excoriation on his upper back. They did not get any Santyl or medihoney. We will take care of this today. 07/12/16 unfortunately patient's wounds appeared to be doing better across the board and the eschar that is overlying the sacral wound has loosened up to where I could debride somewhat today. There's no evidence of 07/20/16; butterfly shaped wound across the sacrum and the surrounding skin and  soft tissue. The area on the right is much larger than the left. The area on the left looks healthy on the right still and necrotic nonviable surface. Using Santyl 07/26/16; butterfly shaped wound across the sacrum and surrounding skin and soft tissue. The area on the right much larger than the left. Extensive debridement on the right last week post-debridement culture showed enterococcus faecalis. This is ampicillin sensitive and he'll definitely need Augmentin. We have been using Santyl.[name] improvement this week 08/02/16; butterfly shaped wound across the sacrum and surrounding skin and soft tissue. The area on the left is just about closed the area on the right has settled into a stage III wound initially unstageable on arrival. This will slow bleed ongoing debridement both mechanically and enzymatically although we are making good progress each week. Using Santyl/moist gauze/border foam 08/09/16 on evaluation today patient presents with a slough covered sacral wound but he tells me he experiences some mild discomfort regarding. This pain seems to be worse mainly because cleansing and he rates this to be a 2 out of 10. With that being said he does seem to be unfortunately after last week's debridement she states that he had a snack a lot of bleeding upon arrival at home making some progress with the Santyl at this point. His daughter actually does the dressing changes for him daily. Wish they had not noted up until that point. Otherwise things have been going well in regard to treatment. Patient has no Denner, Jodie K. ([id number]) nausea, vomiting, or diarrhea as well as no fever or chills. 08/16/16 on evaluation today patient appears to be doing well in regard to his sacral ulcer. He is the dressing  changes. The only concern that has been made and brought up by his daughter at this point is that the dressings are being sent from the supply company only have a 2 x 2 pad and the wound is larger  than 2 x 2 in dimension. She therefore seven difficulty getting the dressing to seal appropriately without leaking or causing other issues. Obviously this has been frustrating. Nonetheless otherwise his wounds seems to be doing well. No fevers, chills, nausea, or vomiting noted at this time. 08/23/16; the patient continues with Santyl as the primary dressing and in the 3 weeks since I have seen this he has made remarkable improvement. His daughter is changing the dressing. 08/30/16; patient continues to make good improvement with Santyl. Wound is smaller. This is on the right buttock side of what was originally a butterfly shaped wound. 09/06/16; patient continues to make improvement in wound dimensions using Santyl. 09/13/16; wound dimensions are improved still using santyl as the primary dressing 09/27/16; somewhere around 0.9 cm in depth of the wound today which is deeper than last time followed although it is a small reminiscent of a much larger wound. Had been using Santyl today but this will need to change today. 10/04/16; that seems better today. Switch to Endoform last week. This is a small remaining wound of the much larger stage IV pressure area had on presentation 10/11/16; patient has a very small linear wound maintaining on the right buttock. Using Endoform and appears to be making excellent progress Electronic Signature(s) Signed: 10/11/2016 4:33:30 PM By: Linton Ham MD Entered By: Linton Ham on 10/11/2016 16:10:28 [place], [name] ([id number]) -------------------------------------------------------------------------------- Physical Exam Details Patient Name: [name] Date of Service: 10/11/2016 3:15 PM Medical Record Patient Account Number: [id number] Number: Treating RN: Montey Hora 09-02-1940 (75 y.o. Other Clinician: Date of Birth/Sex: Male) Treating Aariz Maish Primary Care Provider: Johny Drilling Provider/Extender: G Referring Provider:  Nettie Elm in Treatment: 15 Constitutional Sitting or standing Blood Pressure is within target range for patient.. Pulse regular and within target range for patient.Marland Kitchen Respirations regular, non-labored and within target range.. Temperature is normal and within the target range for the patient.. Notes Exam; very small wound remaining on the right buttock linear wound without any undermining. We have been using Endoform and this should continue. There is no evidence of surrounding infection Electronic Signature(s) Signed: 10/11/2016 4:33:30 PM By: Linton Ham MD Entered By: Linton Ham on 10/11/2016 16:12:16 [name], [name] ([id number]) -------------------------------------------------------------------------------- Physician Orders Details Patient Name: [name] Date of Service: 10/11/2016 3:15 PM Medical Record Patient Account Number: [id number] Number: Treating RN: Montey Hora Aug 30, 1940 (75 y.o. Other Clinician: Date of Birth/Sex: Male) Treating Vertie Dibbern Primary Care Provider: Johny Drilling Provider/Extender: G Referring Provider: Nettie Elm in Treatment: 15 Verbal / Phone Orders: No Diagnosis Coding Wound Cleansing Wound #1 Right Sacrum o Cleanse wound with mild soap and water Primary Wound Dressing Wound #1 Right Sacrum o Other: - endoform Secondary Dressing Wound #1 Right Sacrum o Dry Gauze o Boardered Foam Dressing Dressing Change Frequency Wound #1 Right Sacrum o Change dressing every day. Follow-up Appointments Wound #1 Right Sacrum o Return Appointment in 2 weeks. Off-Loading Wound #1 Right Sacrum o Turn and reposition every 2 hours Electronic Signature(s) Signed: 10/11/2016 4:33:30 PM By: Linton Ham MD Signed: 10/11/2016 4:33:51 PM By: Montey Hora Entered By: Montey Hora on 10/11/2016 15:20:58 [name], [name]  ([id number]) -------------------------------------------------------------------------------- Problem List Details Patient Name: [name] Date of Service:  10/11/2016 3:15 PM Medical Record Patient Account Number: [id number] Number: Treating RN: Montey Hora 1940-09-30 (75 y.o. Other Clinician: Date of Birth/Sex: Male) Treating Olegario Emberson Primary Care Provider: Johny Drilling Provider/Extender: G Referring Provider: Nettie Elm in Treatment: 15 Active Problems ICD-10 Encounter Code Description Active Date Diagnosis L89.150 Pressure ulcer of sacral region, unstageable 06/28/2016 Yes S91.011D Laceration without foreign body, right ankle, subsequent 06/28/2016 Yes encounter S21.219D Laceration without foreign body of unspecified back wall 06/28/2016 Yes of thorax without penetration into thoracic cavity, subsequent encounter E11.622 Type 2 diabetes mellitus with other skin ulcer 06/28/2016 Yes Inactive Problems Resolved Problems Electronic Signature(s) Signed: 10/11/2016 4:33:30 PM By: Linton Ham MD Entered By: Linton Ham on 10/11/2016 16:08:13 [name], [name] ([id number]) -------------------------------------------------------------------------------- Progress Note Details Patient Name: [name] Date of Service: 10/11/2016 3:15 PM Medical Record Patient Account Number: [id number] Number: Treating RN: Montey Hora [birth year] (75 y.o. Other Clinician: Date of Birth/Sex: Male) Treating Zymiere Trostle Primary Care Provider: Johny Drilling Provider/Extender: G Referring Provider: Nettie Elm in Treatment: 15 Subjective History of Present Illness (HPI) 06/28/16; this is a 76 year old man who was admitted to hospital from 6/13 through 06/18/16 at [facility] after being found down in a closet in his home. It is not exactly clear how long he was actually there although it was probably for days. His total CK  maximum was at 2582. He was admitted with delirium, possibly heat related illness, possible subendocardial MI. An MRI of the brain was negative. During this hospitalization he was noted to have areas of pressure-related injury for the time he was spent on the floor on his back he was stated to have a stage II pressure ulcer over his buttocks. Multiple excoriations were also noted. He has advanced home care about doing physical therapy there requesting a nurse which seems reasonable. The patient is a diabetic on metformin. They're using bacitracin to all these areas. He was prescribed doxycycline which I believe he is still on. 07/05/16; patient with pressure ulcers on his right greater than left buttock surrounding his coccyx. Excoriation over the right lateral ankle an excoriation on his upper back. They did not get any Santyl or medihoney. We will take care of this today. 07/12/16 unfortunately patient's wounds appeared to be doing better across the board and the eschar that is overlying the sacral wound has loosened up to where I could debride somewhat today. There's no evidence of 07/20/16; butterfly shaped wound across the sacrum and the surrounding skin and soft tissue. The area on the right is much larger than the left. The area on the left looks healthy on the right still and necrotic nonviable surface. Using Santyl 07/26/16; butterfly shaped wound across the sacrum and surrounding skin and soft tissue. The area on the right much larger than the left. Extensive debridement on the right last week post-debridement culture showed enterococcus faecalis. This is ampicillin sensitive and he'll definitely need Augmentin. We have been using Santyl.[name] improvement this week 08/02/16; butterfly shaped wound across the sacrum and surrounding skin and soft tissue. The area on the left is just about closed the area on the right has settled into a stage III wound initially unstageable on arrival. This  will slow bleed ongoing debridement both mechanically and enzymatically although we are making good progress each week. Using Santyl/moist gauze/border foam 08/09/16 on evaluation today patient presents with a slough covered sacral wound but he tells me he experiences some mild discomfort regarding. This pain seems  to be worse mainly because cleansing and he rates this to be a 2 out of 10. With that being said he does seem to be unfortunately after last week's debridement she states that he had a snack a lot of bleeding upon arrival at home making some progress with the Santyl at this point. His daughter actually does the dressing changes for him daily. Wish they had not noted up until that point. Otherwise things have been going well in regard to treatment. Patient has no nausea, vomiting, or diarrhea as well as no fever or chills. 08/16/16 on evaluation today patient appears to be doing well in regard to his sacral ulcer. He is the dressing [name], Charles K. ([id number]) changes. The only concern that has been made and brought up by his daughter at this point is that the dressings are being sent from the supply company only have a 2 x 2 pad and the wound is larger than 2 x 2 in dimension. She therefore seven difficulty getting the dressing to seal appropriately without leaking or causing other issues. Obviously this has been frustrating. Nonetheless otherwise his wounds seems to be doing well. No fevers, chills, nausea, or vomiting noted at this time. 08/23/16; the patient continues with Santyl as the primary dressing and in the 3 weeks since I have seen this he has made remarkable improvement. His daughter is changing the dressing. 08/30/16; patient continues to make good improvement with Santyl. Wound is smaller. This is on the right buttock side of what was originally a butterfly shaped wound. 09/06/16; patient continues to make improvement in wound dimensions using Santyl. 09/13/16; wound  dimensions are improved still using santyl as the primary dressing 09/27/16; somewhere around 0.9 cm in depth of the wound today which is deeper than last time followed although it is a small reminiscent of a much larger wound. Had been using Santyl today but this will need to change today. 10/04/16; that seems better today. Switch to Endoform last week. This is a small remaining wound of the much larger stage IV pressure area had on presentation 10/11/16; patient has a very small linear wound maintaining on the right buttock. Using Endoform and appears to be making excellent progress Objective Constitutional Sitting or standing Blood Pressure is within target range for patient.. Pulse regular and within target range for patient.Marland Kitchen Respirations regular, non-labored and within target range.. Temperature is normal and within the target range for the patient.. Vitals Time Taken: 3:08 PM, Height: 69 in, Weight: 147.5 lbs, BMI: 21.8, Temperature: 98.2 F, Pulse: 61 bpm, Respiratory Rate: 16 breaths/min, Blood Pressure: 121/56 mmHg. General Notes: Exam; very small wound remaining on the right buttock linear wound without any undermining. We have been using Endoform and this should continue. There is no evidence of surrounding infection Integumentary (Hair, Skin) Wound #1 status is Open. Original cause of wound was Pressure Injury. The wound is located on the Right Sacrum. The wound measures 0.3cm length x 0.1cm width x 0.2cm depth; 0.024cm^2 area and 0.005cm^3 volume. There is muscle and Fat Layer (Subcutaneous Tissue) Exposed exposed. There is no tunneling or undermining noted. There is a large amount of serous drainage noted. The wound margin is distinct with the outline attached to the wound base. There is large (67-100%) red granulation within the wound bed. There is a small (1-33%) amount of necrotic tissue within the wound bed including Adherent Slough. The [place]  ([id number]) periwound skin appearance exhibited: Induration, Dry/Scaly, Rubor, Erythema. The surrounding wound skin  color is noted with erythema which is circumferential. Periwound temperature was noted as No Abnormality. The periwound has tenderness on palpation. Assessment Active Problems ICD-10 L89.150 - Pressure ulcer of sacral region, unstageable S91.011D - Laceration without foreign body, right ankle, subsequent encounter S21.219D - Laceration without foreign body of unspecified back wall of thorax without penetration into thoracic cavity, subsequent encounter E11.622 - Type 2 diabetes mellitus with other skin ulcer Plan Wound Cleansing: Wound #1 Right Sacrum: Cleanse wound with mild soap and water Primary Wound Dressing: Wound #1 Right Sacrum: Other: - endoform Secondary Dressing: Wound #1 Right Sacrum: Dry Gauze Boardered Foam Dressing Dressing Change Frequency: Wound #1 Right Sacrum: Change dressing every day. Follow-up Appointments: Wound #1 Right Sacrum: Return Appointment in 2 weeks. Off-Loading: Wound #1 Right Sacrum: Turn and reposition every 2 hours [name], [name] ([id number]) nice progress with endoform Electronic Signature(s) Signed: 10/11/2016 4:33:30 PM By: Linton Ham MD Entered By: Linton Ham on 10/11/2016 16:13:43 [name], [name] ([id number]) -------------------------------------------------------------------------------- SuperBill Details Patient Name: [name] Date of Service: 10/11/2016 Medical Record Patient Account Number: [id number] Number: Treating RN: Montey Hora 1940/01/23 (75 y.o. Other Clinician: Date of Birth/Sex: Male) Treating Kendelle Schweers Primary Care Provider: Johny Drilling Provider/Extender: G Referring Provider: Nettie Elm in Treatment: 15 Diagnosis Coding ICD-10 Codes Code Description L89.150 Pressure ulcer of sacral region, unstageable S91.011D Laceration without foreign body,  right ankle, subsequent encounter Laceration without foreign body of unspecified back wall of thorax without penetration into S21.219D thoracic cavity, subsequent encounter E11.622 Type 2 diabetes mellitus with other skin ulcer Facility Procedures CPT4 Code: [id number] Description: [phone] - WOUND CARE VISIT-LEV 2 EST PT Modifier: Quantity: 1 Physician Procedures CPT4 Code: [id number] Description: [id number] - WC PHYS LEVEL 2 - EST PT ICD-10 Description Diagnosis L89.150 Pressure ulcer of sacral region, unstageable Modifier: Quantity: 1 Electronic Signature(s) Signed: 10/11/2016 4:33:30 PM By: Linton Ham MD Entered By: Linton Ham on 10/11/2016 16:14:12

## 2016-10-12 NOTE — Progress Notes (Signed)
SEMISI, BIELA ([id number]) Visit Report for 10/11/2016 Arrival Information Details Patient Name: CAM, DAUPHIN Date of Service: 10/11/2016 3:15 PM Medical Record Number: [id number] Patient Account Number: [id number] Date of Birth/Sex: 09/14/1940 (75 y.o. Male) Treating RN: Montey Hora Primary Care Madina Galati: Johny Drilling Other Clinician: Referring Kiesha Ensey: Johny Drilling Treating Lanaya Bennis/Extender: Tito Dine in Treatment: 15 Visit Information History Since Last Visit Added or deleted any medications: No Patient Arrived: [name] Any new allergies or adverse reactions: No Arrival Time: 15:07 Had a fall or experienced change in No Accompanied By: self activities of daily living that may affect Transfer Assistance: None risk of falls: Patient Identification Verified: Yes Signs or symptoms of abuse/neglect since last No Secondary Verification Process Completed: Yes visito Patient Requires Transmission-Based No Hospitalized since last visit: No Precautions: Has Dressing in Place as Prescribed: Yes Patient Has Alerts: Yes Pain Present Now: No Patient Alerts: DM II Electronic Signature(s) Signed: 10/11/2016 4:33:51 PM By: Montey Hora Entered By: Montey Hora on 10/11/2016 15:07:48 [place], [name] ([id number]) -------------------------------------------------------------------------------- Clinic Level of Care Assessment Details Patient Name: [name] Date of Service: 10/11/2016 3:15 PM Medical Record Number: [id number] Patient Account Number: [id number] Date of Birth/Sex: 1940-09-18 (76 y.o. Male) Treating RN: Montey Hora Primary Care Docia Klar: Johny Drilling Other Clinician: Referring Adelia Baptista: Johny Drilling Treating Tristen Pennino/Extender: Tito Dine in Treatment: 15 Clinic Level of Care Assessment Items TOOL 4 Quantity Score []  - Use when only an EandM is performed on FOLLOW-UP visit 0 ASSESSMENTS - Nursing Assessment /  Reassessment X - Reassessment of Co-morbidities (includes updates in patient status) 1 10 X - Reassessment of Adherence to Treatment Plan 1 5 ASSESSMENTS - Wound and Skin Assessment / Reassessment X - Simple Wound Assessment / Reassessment - one wound 1 5 []  - Complex Wound Assessment / Reassessment - multiple wounds 0 []  - Dermatologic / Skin Assessment (not related to wound area) 0 ASSESSMENTS - Focused Assessment []  - Circumferential Edema Measurements - multi extremities 0 []  - Nutritional Assessment / Counseling / Intervention 0 []  - Lower Extremity Assessment (monofilament, tuning fork, pulses) 0 []  - Peripheral Arterial Disease Assessment (using hand held doppler) 0 ASSESSMENTS - Ostomy and/or Continence Assessment and Care []  - Incontinence Assessment and Management 0 []  - Ostomy Care Assessment and Management (repouching, etc.) 0 PROCESS - Coordination of Care X - Simple Patient / Family Education for ongoing care 1 15 []  - Complex (extensive) Patient / Family Education for ongoing care 0 []  - Staff obtains Programmer, systems, Records, Test Results / Process Orders 0 []  - Staff telephones HHA, Nursing Homes / Clarify orders / etc 0 []  - Routine Transfer to another Facility (non-emergent condition) 0 Rita, Thomson K. ([id number]) []  - Routine Hospital Admission (non-emergent condition) 0 []  - New Admissions / Biomedical engineer / Ordering NPWT, Apligraf, etc. 0 []  - Emergency Hospital Admission (emergent condition) 0 X - Simple Discharge Coordination 1 10 []  - Complex (extensive) Discharge Coordination 0 PROCESS - Special Needs []  - Pediatric / Minor Patient Management 0 []  - Isolation Patient Management 0 []  - Hearing / Language / Visual special needs 0 []  - Assessment of Community assistance (transportation, D/C planning, etc.) 0 []  - Additional assistance / Altered mentation 0 []  - Support Surface(s) Assessment (bed, cushion, seat, etc.) 0 INTERVENTIONS - Wound Cleansing /  Measurement X - Simple Wound Cleansing - one wound 1 5 []  - Complex Wound Cleansing - multiple wounds 0 X - Wound Imaging (photographs - any number of  wounds) 1 5 []  - Wound Tracing (instead of photographs) 0 X - Simple Wound Measurement - one wound 1 5 []  - Complex Wound Measurement - multiple wounds 0 INTERVENTIONS - Wound Dressings X - Small Wound Dressing one or multiple wounds 1 10 []  - Medium Wound Dressing one or multiple wounds 0 []  - Large Wound Dressing one or multiple wounds 0 []  - Application of Medications - topical 0 []  - Application of Medications - injection 0 INTERVENTIONS - Miscellaneous []  - External ear exam 0 Harbold, Jusiah K. ([id number]) []  - Specimen Collection (cultures, biopsies, blood, body fluids, etc.) 0 []  - Specimen(s) / Culture(s) sent or taken to Lab for analysis 0 []  - Patient Transfer (multiple staff / Harrel Lemon Lift / Similar devices) 0 []  - Simple Staple / Suture removal (25 or less) 0 []  - Complex Staple / Suture removal (26 or more) 0 []  - Hypo / Hyperglycemic Management (close monitor of Blood Glucose) 0 []  - Ankle / Brachial Index (ABI) - do not check if billed separately 0 X - Vital Signs 1 5 Has the patient been seen at the hospital within the last three years: Yes Total Score: 75 Level Of Care: New/Established - Level 2 Electronic Signature(s) Signed: 10/11/2016 4:33:51 PM By: Montey Hora Entered By: Montey Hora on 10/11/2016 15:54:26 [place], [name] ([id number]) -------------------------------------------------------------------------------- Encounter Discharge Information Details Patient Name: [name] Date of Service: 10/11/2016 3:15 PM Medical Record Number: [id number] Patient Account Number: [id number] Date of Birth/Sex: [birth year] (76 y.o. Male) Treating RN: Montey Hora Primary Care Marylouise Mallet: Johny Drilling Other Clinician: Referring Annetta Deiss: Johny Drilling Treating Markie Frith/Extender: Tito Dine in  Treatment: 15 Encounter Discharge Information Items Discharge Pain Level: 0 Discharge Condition: Stable Ambulatory Status: Cane Discharge Destination: Home Transportation: Private Auto Accompanied By: self Schedule Follow-up Appointment: Yes Medication Reconciliation completed No and provided to Patient/Care Leisha Trinkle: Provided on Clinical Summary of Care: 10/11/2016 Form Type Recipient Paper Patient LM Electronic Signature(s) Signed: 10/11/2016 4:33:51 PM By: Montey Hora Entered By: Montey Hora on 10/11/2016 15:55:19 [place], [name] ([id number]) -------------------------------------------------------------------------------- Multi Wound Chart Details Patient Name: [name] Date of Service: 10/11/2016 3:15 PM Medical Record Number: [id number] Patient Account Number: [id number] Date of Birth/Sex: 1940-11-06 (76 y.o. Male) Treating RN: Montey Hora Primary Care Julieana Eshleman: Johny Drilling Other Clinician: Referring Lucianne Smestad: Johny Drilling Treating Pearly Apachito/Extender: Ricard Dillon Weeks in Treatment: 15 Vital Signs Height(in): 69 Pulse(bpm): 61 Weight(lbs): 147.5 Blood Pressure 121/56 (mmHg): Body Mass Index(BMI): 22 Temperature(F): 98.2 Respiratory Rate 16 (breaths/min): Photos: [1:No Photos] [N/A:N/A] Wound Location: [1:Right Sacrum] [N/A:N/A] Wounding Event: [1:Pressure Injury] [N/A:N/A] Primary Etiology: [1:Pressure Ulcer] [N/A:N/A] Date Acquired: [1:06/14/2016] [N/A:N/A] Weeks of Treatment: [1:15] [N/A:N/A] Wound Status: [1:Open] [N/A:N/A] Measurements L x W x D 0.3x0.1x0.2 [N/A:N/A] (cm) Area (cm) : [1:0.024] [N/A:N/A] Volume (cm) : [1:0.005] [N/A:N/A] % Reduction in Area: [1:99.90%] [N/A:N/A] % Reduction in Volume: 99.90% [N/A:N/A] Classification: [1:Category/Stage IV] [N/A:N/A] Exudate Amount: [1:Large] [N/A:N/A] Exudate Type: [1:Serous] [N/A:N/A] Exudate Color: [1:amber] [N/A:N/A] Wound Margin: [1:Distinct, outline attached]  [N/A:N/A] Granulation Amount: [1:Large (67-100%)] [N/A:N/A] Granulation Quality: [1:Red] [N/A:N/A] Necrotic Amount: [1:Small (1-33%)] [N/A:N/A] Exposed Structures: [1:Fat Layer (Subcutaneous Tissue) Exposed: Yes Muscle: Yes Fascia: No Tendon: No Joint: No Bone: No] [N/A:N/A] Epithelialization: [1:Small (1-33%)] [N/A:N/A] Periwound Skin Texture: Induration: Yes [N/A:N/A] Periwound Skin Dry/Scaly: Yes N/A N/A Moisture: Periwound Skin Color: Erythema: Yes N/A N/A Rubor: Yes Erythema Location: Circumferential N/A N/A Temperature: No Abnormality N/A N/A Tenderness on Yes N/A N/A Palpation: Wound Preparation: Ulcer Cleansing: N/A N/A Rinsed/Irrigated  with Saline Topical Anesthetic Applied: Other: lidocaine 4% Treatment Notes Electronic Signature(s) Signed: 10/11/2016 4:33:51 PM By: Montey Hora Entered By: Montey Hora on 10/11/2016 15:20:35 [place], [name] ([id number]) -------------------------------------------------------------------------------- Multi-Disciplinary Care Plan Details Patient Name: [name] Date of Service: 10/11/2016 3:15 PM Medical Record Number: [id number] Patient Account Number: [id number] Date of Birth/Sex: [birth year] (76 y.o. Male) Treating RN: Montey Hora Primary Care Kellye Mizner: Johny Drilling Other Clinician: Referring Rolande Moe: Johny Drilling Treating Sandon Yoho/Extender: Tito Dine in Treatment: 15 Active Inactive ` Abuse / Safety / Falls / Self Care Management Nursing Diagnoses: Potential for falls Goals: Patient will not experience any injury related to falls Date Initiated: 06/28/2016 Target Resolution Date: 10/08/2016 Goal Status: Active Interventions: Assess Activities of Daily Living upon admission and as needed Assess: immobility, friction, shearing, incontinence upon admission and as needed Assess impairment of mobility on admission and as needed per policy Notes: ` Nutrition Nursing Diagnoses: Imbalanced  nutrition Impaired glucose control: actual or potential Potential for alteratiion in Nutrition/Potential for imbalanced nutrition Goals: Patient/caregiver agrees to and verbalizes understanding of need to use nutritional supplements and/or vitamins as prescribed Date Initiated: 06/28/2016 Target Resolution Date: 09/10/2016 Goal Status: Active Patient/caregiver verbalizes understanding of need to maintain therapeutic glucose control per primary care physician Date Initiated: 06/28/2016 Target Resolution Date: 09/10/2016 Goal Status: Active Interventions: MCADAMSSmitty, Ackerley ([id number]) Assess patient nutrition upon admission and as needed per policy Notes: ` Orientation to the Wound Care Program Nursing Diagnoses: Knowledge deficit related to the wound healing center program Goals: Patient/caregiver will verbalize understanding of the [place] Program Date Initiated: 06/28/2016 Target Resolution Date: 07/09/2016 Goal Status: Active Interventions: Provide education on orientation to the wound center Notes: ` Pain, Acute or Chronic Nursing Diagnoses: Pain, acute or chronic: actual or potential Potential alteration in comfort, pain Goals: Patient/caregiver will verbalize adequate pain control between visits Date Initiated: 06/28/2016 Target Resolution Date: 10/08/2016 Goal Status: Active Interventions: Assess comfort goal upon admission Complete pain assessment as per visit requirements Notes: ` Pressure Nursing Diagnoses: Knowledge deficit related to causes and risk factors for pressure ulcer development Knowledge deficit related to management of pressures ulcers Potential for impaired tissue integrity related to pressure, friction, moisture, and shear Goals: Patient will remain free from development of additional pressure ulcers Petta, OISIN YOAKUM ([id number]) Date Initiated: 06/28/2016 Target Resolution Date: 10/08/2016 Goal Status: Active Interventions: Assess:  immobility, friction, shearing, incontinence upon admission and as needed Assess offloading mechanisms upon admission and as needed Notes: ` Wound/Skin Impairment Nursing Diagnoses: Impaired tissue integrity Knowledge deficit related to ulceration/compromised skin integrity Goals: Ulcer/skin breakdown will have a volume reduction of 80% by week 12 Date Initiated: 06/28/2016 Target Resolution Date: 10/01/2016 Goal Status: Active Interventions: Assess patient/caregiver ability to perform ulcer/skin care regimen upon admission and as needed Assess ulceration(s) every visit Notes: Electronic Signature(s) Signed: 10/11/2016 4:33:51 PM By: Montey Hora Entered By: Montey Hora on 10/11/2016 15:20:24 [place], [name] ([id number]) -------------------------------------------------------------------------------- Pain Assessment Details Patient Name: [name] Date of Service: 10/11/2016 3:15 PM Medical Record Number: [id number] Patient Account Number: [id number] Date of Birth/Sex: Jul 31, 1940 (76 y.o. Male) Treating RN: Montey Hora Primary Care Abrial Arrighi: Johny Drilling Other Clinician: Referring Kyla Duffy: Johny Drilling Treating Demetrice Amstutz/Extender: Ricard Dillon Weeks in Treatment: 15 Active Problems Location of Pain Severity and Description of Pain Patient Has Paino No Site Locations Pain Management and Medication Current Pain Management: Electronic Signature(s) Signed: 10/11/2016 4:33:51 PM By: Montey Hora Entered By: Montey Hora on 10/11/2016 15:07:55 [place], [name] ([id number]) --------------------------------------------------------------------------------  Patient/Caregiver Education Details Patient Name: JAHZEEL, POYTHRESS Date of Service: 10/11/2016 3:15 PM Medical Record Patient Account Number: [id number] Number: Treating RN: Montey Hora 09/07/1940 (75 y.o. Other Clinician: Date of Birth/Gender: Male) Treating ROBSON, Williford Primary Care  Physician: Johny Drilling Physician/Extender: G Referring Physician: Nettie Elm in Treatment: 15 Education Assessment Education Provided To: Patient Education Topics Provided Wound/Skin Impairment: Handouts: Other: wound care to continue as ordered Methods: Demonstration, Explain/Verbal Responses: State content correctly Electronic Signature(s) Signed: 10/11/2016 4:33:51 PM By: Montey Hora Entered By: Montey Hora on 10/11/2016 15:56:17 [place], [name] ([id number]) -------------------------------------------------------------------------------- Wound Assessment Details Patient Name: [name] Date of Service: 10/11/2016 3:15 PM Medical Record Number: [id number] Patient Account Number: [id number] Date of Birth/Sex: 07-Jun-1940 (76 y.o. Male) Treating RN: Montey Hora Primary Care Tatyana Biber: Johny Drilling Other Clinician: Referring Sanvi Ehler: Johny Drilling Treating Tristine Langi/Extender: Ricard Dillon Weeks in Treatment: 15 Wound Status Wound Number: 1 Primary Etiology: Pressure Ulcer Wound Location: Right Sacrum Wound Status: Open Wounding Event: Pressure Injury Date Acquired: 06/14/2016 Weeks Of Treatment: 15 Clustered Wound: No Photos Photo Uploaded By: Montey Hora on 10/11/2016 16:01:42 Wound Measurements Length: (cm) 0.3 Width: (cm) 0.1 Depth: (cm) 0.2 Area: (cm) 0.024 Volume: (cm) 0.005 % Reduction in Area: 99.9% % Reduction in Volume: 99.9% Epithelialization: Small (1-33%) Tunneling: No Undermining: No Wound Description Classification: Category/Stage IV Wound Margin: Distinct, outline attached Exudate Amount: Large Exudate Type: Serous Exudate Color: amber Foul Odor After Cleansing: No Slough/Fibrino Yes Wound Bed Granulation Amount: Large (67-100%) Exposed Structure Granulation Quality: Red Fascia Exposed: No Necrotic Amount: Small (1-33%) Fat Layer (Subcutaneous Tissue) Exposed: Yes Necrotic Quality: Adherent  Slough Tendon Exposed: No Heidrich, Fausto K. ([id number]) Muscle Exposed: Yes Necrosis of Muscle: No Joint Exposed: No Bone Exposed: No Periwound Skin Texture Texture Color No Abnormalities Noted: No No Abnormalities Noted: No Induration: Yes Erythema: Yes Erythema Location: Circumferential Moisture Rubor: Yes No Abnormalities Noted: No Dry / Scaly: Yes Temperature / Pain Temperature: No Abnormality Tenderness on Palpation: Yes Wound Preparation Ulcer Cleansing: Rinsed/Irrigated with Saline Topical Anesthetic Applied: Other: lidocaine 4%, Treatment Notes Wound #1 (Right Sacrum) 1. Cleansed with: Clean wound with Normal Saline 4. Dressing Applied: Other dressing (specify in notes) 5. Secondary Dressing Applied Bordered Foam Dressing Dry Gauze Notes endoform Electronic Signature(s) Signed: 10/11/2016 4:33:51 PM By: Montey Hora Entered By: Montey Hora on 10/11/2016 15:20:15 Medeiros, [name] ([id number]) -------------------------------------------------------------------------------- Vitals Details Patient Name: [name] Date of Service: 10/11/2016 3:15 PM Medical Record Number: [id number] Patient Account Number: [id number] Date of Birth/Sex: 14-Sep-1940 (76 y.o. Male) Treating RN: Montey Hora Primary Care Byrant Valent: Johny Drilling Other Clinician: Referring Stark Aguinaga: Johny Drilling Treating Georganne Siple/Extender: Tito Dine in Treatment: 15 Vital Signs Time Taken: 15:08 Temperature (F): 98.2 Height (in): 69 Pulse (bpm): 61 Weight (lbs): 147.5 Respiratory Rate (breaths/min): 16 Body Mass Index (BMI): 21.8 Blood Pressure (mmHg): 121/56 Reference Range: 80 - 120 mg / dl Electronic Signature(s) Signed: 10/11/2016 4:33:51 PM By: Montey Hora Entered By: Montey Hora on 10/11/2016 15:10:23

## 2016-10-25 ENCOUNTER — Encounter: Payer: Medicare Other | Admitting: Internal Medicine

## 2016-10-25 DIAGNOSIS — E11622 Type 2 diabetes mellitus with other skin ulcer: Secondary | ICD-10-CM | POA: Diagnosis not present

## 2016-10-27 NOTE — Progress Notes (Signed)
Charles, [name] ([id number]) Visit Report for 10/25/2016 HPI Details Patient Name: [name], [name] Date of Service: 10/25/2016 3:15 PM Medical Record Number: [id number] Patient Account Number: [id number] Date of Birth/Sex: 05-06-1940 (76 y.o. Male) Treating RN: Montey Hora Primary Care Provider: Johny Drilling Other Clinician: Referring Provider: Johny Drilling Treating Provider/Extender: Tito Dine in Treatment: 17 History of Present Illness HPI Description: 06/28/16; this is a 76 year old man who was admitted to hospital from 6/13 through 06/18/16 at [facility] after being found down in a closet in his home. It is not exactly clear how long he was actually there although it was probably for days. His total CK maximum was at 2582. He was admitted with delirium, possibly heat related illness, possible subendocardial MI. An MRI of the brain was negative. During this hospitalization he was noted to have areas of pressure- related injury for the time he was spent on the floor on his back he was stated to have a stage II pressure ulcer over his buttocks. Multiple excoriations were also noted. He has advanced home care about doing physical therapy there requesting a nurse which seems reasonable. The patient is a diabetic on metformin. They're using bacitracin to all these areas. He was prescribed doxycycline which I believe he is still on. 07/05/16; patient with pressure ulcers on his right greater than left buttock surrounding his coccyx. Excoriation over the right lateral ankle an excoriation on his upper back. They did not get any Santyl or medihoney. We will take care of this today. 07/12/16 unfortunately patient's wounds appeared to be doing better across the board and the eschar that is overlying the sacral wound has loosened up to where I could debride somewhat today. There's no evidence of 07/20/16; butterfly shaped wound across the sacrum and the surrounding skin and  soft tissue. The area on the right is much larger than the left. The area on the left looks healthy on the right still and necrotic nonviable surface. Using Santyl 07/26/16; butterfly shaped wound across the sacrum and surrounding skin and soft tissue. The area on the right much larger than the left. Extensive debridement on the right last week post-debridement culture showed enterococcus faecalis. This is ampicillin sensitive and he'll definitely need Augmentin. We have been using Santyl.Kermit Balo improvement this week 08/02/16; butterfly shaped wound across the sacrum and surrounding skin and soft tissue. The area on the left is just about closed the area on the right has settled into a stage III wound initially unstageable on arrival. This will slow bleed ongoing debridement both mechanically and enzymatically although we are making good progress each week. Using Santyl/moist gauze/border foam 08/09/16 on evaluation today patient presents with a slough covered sacral wound but he tells me he experiences some mild discomfort regarding. This pain seems to be worse mainly because cleansing and he rates this to be a 2 out of 10. With that being said he does seem to be unfortunately after last week's debridement she states that he had a snack a lot of bleeding upon arrival at home making some progress with the Santyl at this point. His daughter actually does the dressing changes for him daily. Wish they had not noted up until that point. Otherwise things have been going well in regard to treatment. Patient has no nausea, vomiting, or diarrhea as well as no fever or chills. 08/16/16 on evaluation today patient appears to be doing well in regard to his sacral ulcer. He is the dressing changes. The only  concern that has been made and brought up by his daughter at this point is that the dressings are being sent from the supply company only have a 2 x 2 pad and the wound is larger than 2 x 2 in dimension. She  therefore seven difficulty getting the dressing to seal appropriately without leaking or causing other issues. Obviously this has been frustrating. Nonetheless otherwise his wounds seems to be doing well. No fevers, chills, nausea, or vomiting noted at this time. 08/23/16; the patient continues with Santyl as the primary dressing and in the 3 weeks since I have seen this he has made remarkable improvement. His daughter is changing the dressing. 08/30/16; patient continues to make good improvement with Santyl. Wound is smaller. This is on the right buttock side of what was originally a butterfly shaped wound. 09/06/16; patient continues to make improvement in wound dimensions using Santyl. 09/13/16; wound dimensions are improved still using santyl as the primary dressing 09/27/16; somewhere around 0.9 cm in depth of the wound today which is deeper than last time followed although it is a small reminiscent of a much larger wound. Had been using Santyl today but this will need to change today. 10/04/16; that seems better today. Switch to Endoform last week. This is a small remaining wound of the much larger stage IV pressure area had on presentation ISHAM, SMITHERMAN. ([id number]) 10/11/16; patient has a very small linear wound maintaining on the right buttock. Using Endoform and appears to be making excellent progress 10/25/16; this is a patient who had an acute medical illness and was found down at home believe this was in early June of this year. He had a butterfly shaped necrotic pressure area over his lower sacrum and adjacent buttocks. He required extensive debridement both mechanically and enzymatically and he is fully epithelialized as that as of today. He will be able to be discharged Electronic Signature(s) Signed: 10/25/2016 4:56:05 PM By: Linton Ham MD Entered By: Linton Ham on 10/25/2016 15:49:31 Forgan, [name]  ([id number]) -------------------------------------------------------------------------------- Physical Exam Details Patient Name: [name] Date of Service: 10/25/2016 3:15 PM Medical Record Number: [id number] Patient Account Number: [id number] Date of Birth/Sex: [birth year] (76 y.o. Male) Treating RN: Montey Hora Primary Care Provider: Johny Drilling Other Clinician: Referring Provider: Johny Drilling Treating Provider/Extender: Tito Dine in Treatment: 17 Constitutional Sitting or standing Blood Pressure is within target range for patient.. Pulse regular and within target range for patient.Marland Kitchen Respirations regular, non-labored and within target range.. Temperature is normal and within the target range for the patient.Marland Kitchen appears in no distress. Notes Wound exam. The area is totally epithelialized without any evidence of remaining wound area. There is no evidence of surrounding infection. The patient is healed Electronic Signature(s) Signed: 10/25/2016 4:56:05 PM By: Linton Ham MD Entered By: Linton Ham on 10/25/2016 15:50:11 Rodak, [name] ([id number]) -------------------------------------------------------------------------------- Physician Orders Details Patient Name: [name] Date of Service: 10/25/2016 3:15 PM Medical Record Number: [id number] Patient Account Number: [id number] Date of Birth/Sex: [birth year] (76 y.o. Male) Treating RN: Montey Hora Primary Care Provider: Johny Drilling Other Clinician: Referring Provider: Johny Drilling Treating Provider/Extender: Tito Dine in Treatment: 51 Verbal / Phone Orders: No Diagnosis Coding Discharge From [facility] Services o Discharge from [place] Signature(s) Signed: 10/25/2016 4:56:05 PM By: Linton Ham MD Signed: 10/25/2016 5:26:58 PM By: Montey Hora Entered By: Montey Hora on 10/25/2016 15:43:02 [place], [name]  ([id number]) -------------------------------------------------------------------------------- Problem List Details Patient Name: [name]. Date  of Service: 10/25/2016 3:15 PM Medical Record Number: [id number] Patient Account Number: [id number] Date of Birth/Sex: Nov 26, 1940 (76 y.o. Male) Treating RN: Montey Hora Primary Care Provider: Johny Drilling Other Clinician: Referring Provider: Johny Drilling Treating Provider/Extender: Tito Dine in Treatment: 17 Active Problems ICD-10 Encounter Code Description Active Date Diagnosis L89.150 Pressure ulcer of sacral region, unstageable 06/28/2016 Yes S91.011D Laceration without foreign body, right ankle, subsequent encounter 06/28/2016 Yes S21.219D Laceration without foreign body of unspecified back wall of thorax 06/28/2016 Yes without penetration into thoracic cavity, subsequent encounter E11.622 Type 2 diabetes mellitus with other skin ulcer 06/28/2016 Yes Inactive Problems Resolved Problems Electronic Signature(s) Signed: 10/25/2016 4:56:05 PM By: Linton Ham MD Entered By: Linton Ham on 10/25/2016 15:47:13 [place], [name] ([id number]) -------------------------------------------------------------------------------- Progress Note Details Patient Name: [name] Date of Service: 10/25/2016 3:15 PM Medical Record Number: [id number] Patient Account Number: [id number] Date of Birth/Sex: 05-31-1940 (76 y.o. Male) Treating RN: Montey Hora Primary Care Provider: Johny Drilling Other Clinician: Referring Provider: Johny Drilling Treating Provider/Extender: Ricard Dillon Weeks in Treatment: 17 Subjective History of Present Illness (HPI) 06/28/16; this is a 76 year old man who was admitted to hospital from 6/13 through 06/18/16 at [facility] after being found down in a closet in his home. It is not exactly clear how long he was actually there although it was probably for days. His total CK  maximum was at 2582. He was admitted with delirium, possibly heat related illness, possible subendocardial MI. An MRI of the brain was negative. During this hospitalization he was noted to have areas of pressure-related injury for the time he was spent on the floor on his back he was stated to have a stage II pressure ulcer over his buttocks. Multiple excoriations were also noted. He has advanced home care about doing physical therapy there requesting a nurse which seems reasonable. The patient is a diabetic on metformin. They're using bacitracin to all these areas. He was prescribed doxycycline which I believe he is still on. 07/05/16; patient with pressure ulcers on his right greater than left buttock surrounding his coccyx. Excoriation over the right lateral ankle an excoriation on his upper back. They did not get any Santyl or medihoney. We will take care of this today. 07/12/16 unfortunately patient's wounds appeared to be doing better across the board and the eschar that is overlying the sacral wound has loosened up to where I could debride somewhat today. There's no evidence of 07/20/16; butterfly shaped wound across the sacrum and the surrounding skin and soft tissue. The area on the right is much larger than the left. The area on the left looks healthy on the right still and necrotic nonviable surface. Using Santyl 07/26/16; butterfly shaped wound across the sacrum and surrounding skin and soft tissue. The area on the right much larger than the left. Extensive debridement on the right last week post-debridement culture showed enterococcus faecalis. This is ampicillin sensitive and he'll definitely need Augmentin. We have been using Santyl.Kermit Balo improvement this week 08/02/16; butterfly shaped wound across the sacrum and surrounding skin and soft tissue. The area on the left is just about closed the area on the right has settled into a stage III wound initially unstageable on arrival. This will  slow bleed ongoing debridement both mechanically and enzymatically although we are making good progress each week. Using Santyl/moist gauze/border foam 08/09/16 on evaluation today patient presents with a slough covered sacral wound but he tells me he experiences some mild discomfort regarding. This  pain seems to be worse mainly because cleansing and he rates this to be a 2 out of 10. With that being said he does seem to be unfortunately after last week's debridement she states that he had a snack a lot of bleeding upon arrival at home making some progress with the Santyl at this point. His daughter actually does the dressing changes for him daily. Wish they had not noted up until that point. Otherwise things have been going well in regard to treatment. Patient has no nausea, vomiting, or diarrhea as well as no fever or chills. 08/16/16 on evaluation today patient appears to be doing well in regard to his sacral ulcer. He is the dressing changes. The only concern that has been made and brought up by his daughter at this point is that the dressings are being sent from the supply company only have a 2 x 2 pad and the wound is larger than 2 x 2 in dimension. She therefore seven difficulty getting the dressing to seal appropriately without leaking or causing other issues. Obviously this has been frustrating. Nonetheless otherwise his wounds seems to be doing well. No fevers, chills, nausea, or vomiting noted at this time. 08/23/16; the patient continues with Santyl as the primary dressing and in the 3 weeks since I have seen this he has made remarkable improvement. His daughter is changing the dressing. 08/30/16; patient continues to make good improvement with Santyl. Wound is smaller. This is on the right buttock side of what was originally a butterfly shaped wound. 09/06/16; patient continues to make improvement in wound dimensions using Santyl. 09/13/16; wound dimensions are improved still using santyl as  the primary dressing 09/27/16; somewhere around 0.9 cm in depth of the wound today which is deeper than last time followed although it is a small reminiscent of a much larger wound. Had been using Santyl today but this will need to change today. 10/04/16; that seems better today. Switch to Endoform last week. This is a small remaining wound of the much larger stage IV pressure area had on presentation 10/11/16; patient has a very small linear wound maintaining on the right buttock. Using Endoform and appears to be making excellent progress EMERIC, NOVINGER ([id number]) 10/25/16; this is a patient who had an acute medical illness and was found down at home believe this was in early June of this year. He had a butterfly shaped necrotic pressure area over his lower sacrum and adjacent buttocks. He required extensive debridement both mechanically and enzymatically and he is fully epithelialized as that as of today. He will be able to be discharged Objective Constitutional Sitting or standing Blood Pressure is within target range for patient.. Pulse regular and within target range for patient.Marland Kitchen Respirations regular, non-labored and within target range.. Temperature is normal and within the target range for the patient.Marland Kitchen appears in no distress. Vitals Time Taken: 3:34 PM, Height: 69 in, Weight: 147.5 lbs, BMI: 21.8, Temperature: 98.2 F, Pulse: 60 bpm, Respiratory Rate: 16 breaths/min, Blood Pressure: 139/69 mmHg. General Notes: Wound exam. The area is totally epithelialized without any evidence of remaining wound area. There is no evidence of surrounding infection. The patient is healed Integumentary (Hair, Skin) Wound #1 status is Healed - Epithelialized. Original cause of wound was Pressure Injury. The wound is located on the Right Sacrum. The wound measures 0cm length x 0cm width x 0cm depth; 0cm^2 area and 0cm^3 volume. Assessment Active Problems ICD-10 L89.150 - Pressure ulcer of sacral  region, unstageable S91.011D -  Laceration without foreign body, right ankle, subsequent encounter S21.219D - Laceration without foreign body of unspecified back wall of thorax without penetration into thoracic cavity, subsequent encounter E11.622 - Type 2 diabetes mellitus with other skin ulcer Plan Discharge From [facility] Services: Discharge from [facility] [place] ([id number]) #1 the patient be discharged from the wound care center. #2 he had bilateral stage III pressure ulcers in his lower sacrum and surrounding buttocks. These have healed #3 he has now ambulatory and appears well. No suggestions for secondary prevention Electronic Signature(s) Signed: 10/25/2016 4:56:05 PM By: Linton Ham MD Entered By: Linton Ham on 10/25/2016 15:51:07 [place], [name] ([id number]) -------------------------------------------------------------------------------- [place] Details Patient Name: [name] Date of Service: 10/25/2016 Medical Record Number: [id number] Patient Account Number: [id number] Date of Birth/Sex: 03-Aug-1940 (75 y.o. Male) Treating RN: Montey Hora Primary Care Provider: Johny Drilling Other Clinician: Referring Provider: Johny Drilling Treating Provider/Extender: Tito Dine in Treatment: 17 Diagnosis Coding ICD-10 Codes Code Description L89.150 Pressure ulcer of sacral region, unstageable S91.011D Laceration without foreign body, right ankle, subsequent encounter Laceration without foreign body of unspecified back wall of thorax without penetration into thoracic cavity, S21.219D subsequent encounter E11.622 Type 2 diabetes mellitus with other skin ulcer Facility Procedures CPT4 Code: [id number] Description: 19166 - WOUND CARE VISIT-LEV 2 EST PT Modifier: Quantity: 1 Physician Procedures CPT4 Code: 0600459 Description: 97741 - WC PHYS LEVEL 2 - EST PT ICD-10 Diagnosis Description L89.150 Pressure ulcer of sacral region,  unstageable S91.011D Laceration without foreign body, right ankle, subsequent enc Modifier: ounter Quantity: 1 Electronic Signature(s) Signed: 10/25/2016 5:03:05 PM By: Montey Hora Signed: 10/26/2016 5:28:55 PM By: Linton Ham MD Previous Signature: 10/25/2016 4:56:05 PM Version By: Linton Ham MD Entered By: Montey Hora on 10/25/2016 17:03:05

## 2016-10-27 NOTE — Progress Notes (Signed)
LAWYER, WASHABAUGH ([id number]) Visit Report for 10/25/2016 Arrival Information Details Patient Name: [name], [name] Date of Service: 10/25/2016 3:15 PM Medical Record Number: [id number] Patient Account Number: [id number] Date of Birth/Sex: 1940/03/24 (76 y.o. Male) Treating RN: Montey Hora Primary Care Charley Lafrance: Johny Drilling Other Clinician: Referring Yiselle Babich: Johny Drilling Treating Aleea Hendry/Extender: Tito Dine in Treatment: 35 Visit Information History Since Last Visit Added or deleted any medications: No Patient Arrived: Ambulatory Any new allergies or adverse reactions: No Arrival Time: 15:33 Had a fall or experienced change in No Accompanied By: self activities of daily living that may affect Transfer Assistance: None risk of falls: Patient Identification Verified: Yes Signs or symptoms of abuse/neglect since last visito No Secondary Verification Process Completed: Yes Hospitalized since last visit: No Patient Requires Transmission-Based No Has Dressing in Place as Prescribed: Yes Precautions: Pain Present Now: No Patient Has Alerts: Yes Patient Alerts: DM II Electronic Signature(s) Signed: 10/25/2016 5:26:58 PM By: Montey Hora Entered By: Montey Hora on 10/25/2016 15:34:11 [place], [name] ([id number]) -------------------------------------------------------------------------------- Clinic Level of Care Assessment Details Patient Name: [name] Date of Service: 10/25/2016 3:15 PM Medical Record Number: [id number] Patient Account Number: [id number] Date of Birth/Sex: 07/22/1940 (76 y.o. Male) Treating RN: Montey Hora Primary Care Bethanne Mule: Johny Drilling Other Clinician: Referring Jiovanny Burdell: Johny Drilling Treating Racheal Mathurin/Extender: Tito Dine in Treatment: 17 Clinic Level of Care Assessment Items TOOL 4 Quantity Score []  - Use when only an EandM is performed on FOLLOW-UP visit 0 ASSESSMENTS - Nursing Assessment /  Reassessment X - Reassessment of Co-morbidities (includes updates in patient status) 1 10 X- 1 5 Reassessment of Adherence to Treatment Plan ASSESSMENTS - Wound and Skin Assessment / Reassessment X - Simple Wound Assessment / Reassessment - one wound 1 5 []  - 0 Complex Wound Assessment / Reassessment - multiple wounds []  - 0 Dermatologic / Skin Assessment (not related to wound area) ASSESSMENTS - Focused Assessment []  - Circumferential Edema Measurements - multi extremities 0 []  - 0 Nutritional Assessment / Counseling / Intervention []  - 0 Lower Extremity Assessment (monofilament, tuning fork, pulses) []  - 0 Peripheral Arterial Disease Assessment (using hand held doppler) ASSESSMENTS - Ostomy and/or Continence Assessment and Care []  - Incontinence Assessment and Management 0 []  - 0 Ostomy Care Assessment and Management (repouching, etc.) PROCESS - Coordination of Care X - Simple Patient / Family Education for ongoing care 1 15 []  - 0 Complex (extensive) Patient / Family Education for ongoing care []  - 0 Staff obtains Programmer, systems, Records, Test Results / Process Orders []  - 0 Staff telephones HHA, Nursing Homes / Clarify orders / etc []  - 0 Routine Transfer to another Facility (non-emergent condition) []  - 0 Routine Hospital Admission (non-emergent condition) []  - 0 New Admissions / Biomedical engineer / Ordering NPWT, Apligraf, etc. []  - 0 Emergency Hospital Admission (emergent condition) X- 1 10 Simple Discharge Coordination Whiten, Kalieb K. ([id number]) []  - 0 Complex (extensive) Discharge Coordination PROCESS - Special Needs []  - Pediatric / Minor Patient Management 0 []  - 0 Isolation Patient Management []  - 0 Hearing / Language / Visual special needs []  - 0 Assessment of Community assistance (transportation, D/C planning, etc.) []  - 0 Additional assistance / Altered mentation []  - 0 Support Surface(s) Assessment (bed, cushion, seat, etc.) INTERVENTIONS -  Wound Cleansing / Measurement X - Simple Wound Cleansing - one wound 1 5 []  - 0 Complex Wound Cleansing - multiple wounds X- 1 5 Wound Imaging (photographs - any number of wounds) []  -  0 Wound Tracing (instead of photographs) X- 1 5 Simple Wound Measurement - one wound []  - 0 Complex Wound Measurement - multiple wounds INTERVENTIONS - Wound Dressings []  - Small Wound Dressing one or multiple wounds 0 []  - 0 Medium Wound Dressing one or multiple wounds []  - 0 Large Wound Dressing one or multiple wounds []  - 0 Application of Medications - topical []  - 0 Application of Medications - injection INTERVENTIONS - Miscellaneous []  - External ear exam 0 []  - 0 Specimen Collection (cultures, biopsies, blood, body fluids, etc.) []  - 0 Specimen(s) / Culture(s) sent or taken to Lab for analysis []  - 0 Patient Transfer (multiple staff / Civil Service fast streamer / Similar devices) []  - 0 Simple Staple / Suture removal (25 or less) []  - 0 Complex Staple / Suture removal (26 or more) []  - 0 Hypo / Hyperglycemic Management (close monitor of Blood Glucose) []  - 0 Ankle / Brachial Index (ABI) - do not check if billed separately X- 1 5 Vital Signs Leu, Weiland K. ([id number]) Has the patient been seen at the hospital within the last three years: Yes Total Score: 65 Level Of Care: New/Established - Level 2 Electronic Signature(s) Signed: 10/25/2016 5:26:58 PM By: Montey Hora Entered By: Montey Hora on 10/25/2016 17:02:54 Berendt, [name] ([id number]) -------------------------------------------------------------------------------- Encounter Discharge Information Details Patient Name: [name] Date of Service: 10/25/2016 3:15 PM Medical Record Number: [id number] Patient Account Number: [id number] Date of Birth/Sex: 01/05/1940 (76 y.o. Male) Treating RN: Montey Hora Primary Care Kaysie Michelini: Johny Drilling Other Clinician: Referring Rayden Scheper: Johny Drilling Treating Shalay Carder/Extender:  Tito Dine in Treatment: 17 Encounter Discharge Information Items Discharge Pain Level: 0 Discharge Condition: Stable Ambulatory Status: Ambulatory Discharge Destination: Home Transportation: Private Auto Accompanied By: self Schedule Follow-up Appointment: No Medication Reconciliation completed and No provided to Patient/Care Lamone Ferrelli: Provided on Clinical Summary of Care: 10/25/2016 Form Type Recipient Paper Patient LM Electronic Signature(s) Signed: 10/25/2016 5:03:26 PM By: Montey Hora Previous Signature: 10/25/2016 4:39:05 PM Version By: Ruthine Dose Entered By: Montey Hora on 10/25/2016 17:03:25 [place]lemings, [name] ([id number]) -------------------------------------------------------------------------------- Multi Wound Chart Details Patient Name: [name] Date of Service: 10/25/2016 3:15 PM Medical Record Number: [id number] Patient Account Number: [id number] Date of Birth/Sex: 1940-03-15 (76 y.o. Male) Treating RN: Montey Hora Primary Care Mekiah Cambridge: Johny Drilling Other Clinician: Referring Rodderick Holtzer: Johny Drilling Treating Gursimran Litaker/Extender: Ricard Dillon Weeks in Treatment: 17 Vital Signs Height(in): 69 Pulse(bpm): 60 Weight(lbs): 147.5 Blood Pressure(mmHg): 139/69 Body Mass Index(BMI): 22 Temperature(F): 98.2 Respiratory Rate 16 (breaths/min): Photos: [1:No Photos] [N/A:N/A] Wound Location: [1:Right Sacrum] [N/A:N/A] Wounding Event: [1:Pressure Injury] [N/A:N/A] Primary Etiology: [1:Pressure Ulcer] [N/A:N/A] Date Acquired: [1:06/14/2016] [N/A:N/A] Weeks of Treatment: [1:17] [N/A:N/A] Wound Status: [1:Healed - Epithelialized] [N/A:N/A] Measurements L x W x D [1:0x0x0] [N/A:N/A] (cm) Area (cm) : [1:0] [N/A:N/A] Volume (cm) : [1:0] [N/A:N/A] % Reduction in Area: [1:100.00%] [N/A:N/A] % Reduction in Volume: [1:100.00%] [N/A:N/A] Classification: [1:Category/Stage IV] [N/A:N/A] Periwound Skin Texture: [1:No Abnormalities  Noted] [N/A:N/A] Periwound Skin Moisture: [1:No Abnormalities Noted] [N/A:N/A] Periwound Skin Color: [1:No Abnormalities Noted No] [N/A:N/A N/A] Treatment Notes Electronic Signature(s) Signed: 10/25/2016 4:56:05 PM By: Linton Ham MD Entered By: Linton Ham on 10/25/2016 15:47:23 Berenson, [name] ([id number]) -------------------------------------------------------------------------------- Multi-Disciplinary Care Plan Details Patient Name: [name] Date of Service: 10/25/2016 3:15 PM Medical Record Number: [id number] Patient Account Number: [id number] Date of Birth/Sex: April 07, 1940 (76 y.o. Male) Treating RN: Montey Hora Primary Care Ivannia Willhelm: Johny Drilling Other Clinician: Referring Delle Andrzejewski: Johny Drilling Treating Zenaida Tesar/Extender: Tito Dine  in Treatment: 17 Active Inactive Electronic Signature(s) Signed: 10/25/2016 5:02:22 PM By: Montey Hora Entered By: Montey Hora on 10/25/2016 17:02:22 Askren, [name] ([id number]) -------------------------------------------------------------------------------- Pain Assessment Details Patient Name: [name] Date of Service: 10/25/2016 3:15 PM Medical Record Number: [id number] Patient Account Number: [id number] Date of Birth/Sex: 1940-02-22 (76 y.o. Male) Treating RN: Montey Hora Primary Care Gelila Well: Johny Drilling Other Clinician: Referring Rawson Minix: Johny Drilling Treating Dereka Lueras/Extender: Tito Dine in Treatment: 17 Active Problems Location of Pain Severity and Description of Pain Patient Has Paino No Site Locations Pain Management and Medication Current Pain Management: Notes Topical or injectable lidocaine is offered to patient for acute pain when surgical debridement is performed. If needed, Patient is instructed to use over the counter pain medication for the following 24-48 hours after debridement. Wound care MDs do not prescribed pain medications. Patient has  chronic pain or uncontrolled pain. Patient has been instructed to make an appointment with their Primary Care Physician for pain management. Electronic Signature(s) Signed: 10/25/2016 5:26:58 PM By: Montey Hora Entered By: Montey Hora on 10/25/2016 15:34:20 Wegmann, [name] ([id number]) -------------------------------------------------------------------------------- Patient/Caregiver Education Details Patient Name: [name] Date of Service: 10/25/2016 3:15 PM Medical Record Number: [id number] Patient Account Number: [id number] Date of Birth/Gender: 02-Apr-1940 (76 y.o. Male) Treating RN: Montey Hora Primary Care Physician: Johny Drilling Other Clinician: Referring Physician: Johny Drilling Treating Physician/Extender: Tito Dine in Treatment: 17 Education Assessment Education Provided To: Patient Education Topics Provided Basic Hygiene: Handouts: Other: care of newly healed ulcer site Methods: Explain/Verbal Responses: State content correctly Electronic Signature(s) Signed: 10/25/2016 5:26:58 PM By: Montey Hora Entered By: Montey Hora on 10/25/2016 17:03:42 [place]alt, [name] ([id number]) -------------------------------------------------------------------------------- Wound Assessment Details Patient Name: [name] Date of Service: 10/25/2016 3:15 PM Medical Record Number: [id number] Patient Account Number: [id number] Date of Birth/Sex: 1940-08-01 (76 y.o. Male) Treating RN: Montey Hora Primary Care Wyeth Hoffer: Johny Drilling Other Clinician: Referring Chayce Rullo: Johny Drilling Treating Jasiah Elsen/Extender: Ricard Dillon Weeks in Treatment: 17 Wound Status Wound Number: 1 Primary Etiology: Pressure Ulcer Wound Location: Right Sacrum Wound Status: Healed - Epithelialized Wounding Event: Pressure Injury Date Acquired: 06/14/2016 Weeks Of Treatment: 17 Clustered Wound: No Photos Photo Uploaded By: Montey Hora on 10/25/2016  16:51:45 Wound Measurements Length: (cm) 0 Width: (cm) 0 Depth: (cm) 0 Area: (cm) 0 Volume: (cm) 0 % Reduction in Area: 100% % Reduction in Volume: 100% Wound Description Classification: Category/Stage IV Periwound Skin Texture Texture Color No Abnormalities Noted: No No Abnormalities Noted: No Moisture No Abnormalities Noted: No Electronic Signature(s) Signed: 10/25/2016 5:26:58 PM By: Montey Hora Entered By: Montey Hora on 10/25/2016 15:42:43 Lolita, [name] ([id number]) -------------------------------------------------------------------------------- Vitals Details Patient Name: [name] Date of Service: 10/25/2016 3:15 PM Medical Record Number: [id number] Patient Account Number: [id number] Date of Birth/Sex: Jun 20, 1940 (76 y.o. Male) Treating RN: Montey Hora Primary Care Alveena Taira: Johny Drilling Other Clinician: Referring Mateo Overbeck: Johny Drilling Treating Emmitt Matthews/Extender: Ricard Dillon Weeks in Treatment: 17 Vital Signs Time Taken: 15:34 Temperature (F): 98.2 Height (in): 69 Pulse (bpm): 60 Weight (lbs): 147.5 Respiratory Rate (breaths/min): 16 Body Mass Index (BMI): 21.8 Blood Pressure (mmHg): 139/69 Reference Range: 80 - 120 mg / dl Electronic Signature(s) Signed: 10/25/2016 5:26:58 PM By: Montey Hora Entered By: Montey Hora on 10/25/2016 15:36:14

## 2017-03-27 DIAGNOSIS — E876 Hypokalemia: Secondary | ICD-10-CM | POA: Diagnosis not present

## 2017-03-27 DIAGNOSIS — R1011 Right upper quadrant pain: Secondary | ICD-10-CM | POA: Diagnosis not present

## 2017-03-27 DIAGNOSIS — R319 Hematuria, unspecified: Secondary | ICD-10-CM | POA: Diagnosis not present

## 2017-07-05 DIAGNOSIS — R634 Abnormal weight loss: Secondary | ICD-10-CM | POA: Diagnosis not present

## 2017-07-05 DIAGNOSIS — E782 Mixed hyperlipidemia: Secondary | ICD-10-CM | POA: Diagnosis not present

## 2017-07-05 DIAGNOSIS — E1142 Type 2 diabetes mellitus with diabetic polyneuropathy: Secondary | ICD-10-CM | POA: Diagnosis not present

## 2017-07-05 DIAGNOSIS — E119 Type 2 diabetes mellitus without complications: Secondary | ICD-10-CM | POA: Diagnosis not present

## 2017-07-05 DIAGNOSIS — Z794 Long term (current) use of insulin: Secondary | ICD-10-CM | POA: Diagnosis not present

## 2017-07-05 DIAGNOSIS — I2510 Atherosclerotic heart disease of native coronary artery without angina pectoris: Secondary | ICD-10-CM | POA: Diagnosis not present

## 2017-07-05 DIAGNOSIS — R1084 Generalized abdominal pain: Secondary | ICD-10-CM | POA: Diagnosis not present

## 2017-07-10 ENCOUNTER — Other Ambulatory Visit: Payer: Self-pay | Admitting: Family Medicine

## 2017-07-10 DIAGNOSIS — R1084 Generalized abdominal pain: Secondary | ICD-10-CM

## 2017-07-10 DIAGNOSIS — R634 Abnormal weight loss: Secondary | ICD-10-CM

## 2017-07-21 ENCOUNTER — Ambulatory Visit: Admission: RE | Admit: 2017-07-21 | Payer: Medicare Other | Source: Ambulatory Visit

## 2017-07-25 ENCOUNTER — Ambulatory Visit: Admission: RE | Admit: 2017-07-25 | Payer: Medicare HMO | Source: Ambulatory Visit

## 2017-08-01 ENCOUNTER — Ambulatory Visit: Admission: RE | Admit: 2017-08-01 | Payer: Medicare HMO | Source: Ambulatory Visit

## 2017-10-05 DIAGNOSIS — I2510 Atherosclerotic heart disease of native coronary artery without angina pectoris: Secondary | ICD-10-CM | POA: Diagnosis not present

## 2017-10-05 DIAGNOSIS — Z125 Encounter for screening for malignant neoplasm of prostate: Secondary | ICD-10-CM | POA: Diagnosis not present

## 2017-10-05 DIAGNOSIS — E782 Mixed hyperlipidemia: Secondary | ICD-10-CM | POA: Diagnosis not present

## 2017-10-05 DIAGNOSIS — R627 Adult failure to thrive: Secondary | ICD-10-CM | POA: Diagnosis not present

## 2017-10-05 DIAGNOSIS — R1084 Generalized abdominal pain: Secondary | ICD-10-CM | POA: Diagnosis not present

## 2017-10-05 DIAGNOSIS — E1159 Type 2 diabetes mellitus with other circulatory complications: Secondary | ICD-10-CM | POA: Diagnosis not present

## 2017-10-05 DIAGNOSIS — I10 Essential (primary) hypertension: Secondary | ICD-10-CM | POA: Diagnosis not present

## 2017-10-12 DIAGNOSIS — E1169 Type 2 diabetes mellitus with other specified complication: Secondary | ICD-10-CM | POA: Diagnosis not present

## 2017-10-12 DIAGNOSIS — E1165 Type 2 diabetes mellitus with hyperglycemia: Secondary | ICD-10-CM | POA: Diagnosis not present

## 2017-10-12 DIAGNOSIS — E785 Hyperlipidemia, unspecified: Secondary | ICD-10-CM | POA: Diagnosis not present

## 2017-10-12 DIAGNOSIS — N183 Chronic kidney disease, stage 3 (moderate): Secondary | ICD-10-CM | POA: Diagnosis not present

## 2017-10-12 DIAGNOSIS — Z794 Long term (current) use of insulin: Secondary | ICD-10-CM | POA: Diagnosis not present

## 2017-10-12 DIAGNOSIS — I129 Hypertensive chronic kidney disease with stage 1 through stage 4 chronic kidney disease, or unspecified chronic kidney disease: Secondary | ICD-10-CM | POA: Diagnosis not present

## 2017-10-12 DIAGNOSIS — E1122 Type 2 diabetes mellitus with diabetic chronic kidney disease: Secondary | ICD-10-CM | POA: Diagnosis not present

## 2017-10-17 DIAGNOSIS — R112 Nausea with vomiting, unspecified: Secondary | ICD-10-CM | POA: Diagnosis not present

## 2017-10-17 DIAGNOSIS — I2109 ST elevation (STEMI) myocardial infarction involving other coronary artery of anterior wall: Secondary | ICD-10-CM | POA: Diagnosis not present

## 2017-10-17 DIAGNOSIS — I2510 Atherosclerotic heart disease of native coronary artery without angina pectoris: Secondary | ICD-10-CM | POA: Diagnosis not present

## 2017-10-17 DIAGNOSIS — E1159 Type 2 diabetes mellitus with other circulatory complications: Secondary | ICD-10-CM | POA: Diagnosis not present

## 2017-10-17 DIAGNOSIS — I10 Essential (primary) hypertension: Secondary | ICD-10-CM | POA: Diagnosis not present

## 2017-10-25 ENCOUNTER — Ambulatory Visit
Admission: RE | Admit: 2017-10-25 | Discharge: 2017-10-25 | Disposition: A | Discharge status: Home / Self Care | Payer: Medicare HMO | Source: Ambulatory Visit | Attending: Family Medicine | Admitting: Family Medicine

## 2017-10-25 DIAGNOSIS — R079 Chest pain, unspecified: Secondary | ICD-10-CM | POA: Diagnosis not present

## 2017-10-25 DIAGNOSIS — K6389 Other specified diseases of intestine: Secondary | ICD-10-CM | POA: Diagnosis not present

## 2017-10-25 DIAGNOSIS — R1084 Generalized abdominal pain: Secondary | ICD-10-CM

## 2017-10-25 DIAGNOSIS — I10 Essential (primary) hypertension: Secondary | ICD-10-CM | POA: Diagnosis present

## 2017-10-25 DIAGNOSIS — I2510 Atherosclerotic heart disease of native coronary artery without angina pectoris: Secondary | ICD-10-CM | POA: Diagnosis present

## 2017-10-25 DIAGNOSIS — R634 Abnormal weight loss: Secondary | ICD-10-CM

## 2017-10-25 DIAGNOSIS — Z5320 Procedure and treatment not carried out because of patient's decision for unspecified reasons: Secondary | ICD-10-CM | POA: Diagnosis present

## 2017-10-25 DIAGNOSIS — Z794 Long term (current) use of insulin: Secondary | ICD-10-CM | POA: Diagnosis not present

## 2017-10-25 DIAGNOSIS — E1159 Type 2 diabetes mellitus with other circulatory complications: Secondary | ICD-10-CM | POA: Insufficient documentation

## 2017-10-25 DIAGNOSIS — C187 Malignant neoplasm of sigmoid colon: Secondary | ICD-10-CM | POA: Diagnosis not present

## 2017-10-25 DIAGNOSIS — E43 Unspecified severe protein-calorie malnutrition: Secondary | ICD-10-CM | POA: Diagnosis present

## 2017-10-25 DIAGNOSIS — Z833 Family history of diabetes mellitus: Secondary | ICD-10-CM | POA: Diagnosis not present

## 2017-10-25 DIAGNOSIS — M1990 Unspecified osteoarthritis, unspecified site: Secondary | ICD-10-CM | POA: Diagnosis present

## 2017-10-25 DIAGNOSIS — C182 Malignant neoplasm of ascending colon: Secondary | ICD-10-CM | POA: Diagnosis not present

## 2017-10-25 DIAGNOSIS — M5136 Other intervertebral disc degeneration, lumbar region: Secondary | ICD-10-CM

## 2017-10-25 DIAGNOSIS — K654 Sclerosing mesenteritis: Secondary | ICD-10-CM | POA: Diagnosis present

## 2017-10-25 DIAGNOSIS — K6289 Other specified diseases of anus and rectum: Secondary | ICD-10-CM | POA: Diagnosis not present

## 2017-10-25 DIAGNOSIS — K5669 Other partial intestinal obstruction: Secondary | ICD-10-CM | POA: Diagnosis not present

## 2017-10-25 DIAGNOSIS — Z01810 Encounter for preprocedural cardiovascular examination: Secondary | ICD-10-CM | POA: Diagnosis not present

## 2017-10-25 DIAGNOSIS — I700 Atherosclerosis of aorta: Secondary | ICD-10-CM

## 2017-10-25 DIAGNOSIS — D509 Iron deficiency anemia, unspecified: Secondary | ICD-10-CM | POA: Diagnosis present

## 2017-10-25 DIAGNOSIS — K621 Rectal polyp: Secondary | ICD-10-CM | POA: Diagnosis not present

## 2017-10-25 DIAGNOSIS — D125 Benign neoplasm of sigmoid colon: Secondary | ICD-10-CM | POA: Diagnosis not present

## 2017-10-25 DIAGNOSIS — N400 Enlarged prostate without lower urinary tract symptoms: Secondary | ICD-10-CM | POA: Diagnosis present

## 2017-10-25 DIAGNOSIS — K635 Polyp of colon: Secondary | ICD-10-CM | POA: Diagnosis not present

## 2017-10-25 DIAGNOSIS — Z7982 Long term (current) use of aspirin: Secondary | ICD-10-CM | POA: Diagnosis not present

## 2017-10-25 DIAGNOSIS — C19 Malignant neoplasm of rectosigmoid junction: Secondary | ICD-10-CM | POA: Diagnosis not present

## 2017-10-25 DIAGNOSIS — D490 Neoplasm of unspecified behavior of digestive system: Secondary | ICD-10-CM | POA: Diagnosis not present

## 2017-10-25 DIAGNOSIS — E119 Type 2 diabetes mellitus without complications: Secondary | ICD-10-CM | POA: Diagnosis present

## 2017-10-25 DIAGNOSIS — Z955 Presence of coronary angioplasty implant and graft: Secondary | ICD-10-CM | POA: Diagnosis not present

## 2017-10-25 DIAGNOSIS — I252 Old myocardial infarction: Secondary | ICD-10-CM | POA: Diagnosis not present

## 2017-10-25 DIAGNOSIS — R109 Unspecified abdominal pain: Secondary | ICD-10-CM | POA: Diagnosis present

## 2017-10-25 DIAGNOSIS — E538 Deficiency of other specified B group vitamins: Secondary | ICD-10-CM | POA: Diagnosis present

## 2017-10-25 DIAGNOSIS — Z823 Family history of stroke: Secondary | ICD-10-CM | POA: Diagnosis not present

## 2017-10-25 DIAGNOSIS — K561 Intussusception: Secondary | ICD-10-CM | POA: Diagnosis not present

## 2017-10-25 DIAGNOSIS — C189 Malignant neoplasm of colon, unspecified: Secondary | ICD-10-CM | POA: Diagnosis not present

## 2017-10-25 DIAGNOSIS — E785 Hyperlipidemia, unspecified: Secondary | ICD-10-CM | POA: Diagnosis present

## 2017-10-25 DIAGNOSIS — C180 Malignant neoplasm of cecum: Secondary | ICD-10-CM | POA: Diagnosis present

## 2017-10-25 DIAGNOSIS — K219 Gastro-esophageal reflux disease without esophagitis: Secondary | ICD-10-CM | POA: Diagnosis not present

## 2017-10-25 DIAGNOSIS — D122 Benign neoplasm of ascending colon: Secondary | ICD-10-CM | POA: Diagnosis not present

## 2017-10-25 DIAGNOSIS — R1030 Lower abdominal pain, unspecified: Secondary | ICD-10-CM | POA: Diagnosis not present

## 2017-10-25 MED ORDER — IOPAMIDOL (ISOVUE-300) INJECTION 61%
80.0000 mL | Freq: Once | INTRAVENOUS | Status: AC | PRN
  Administered 2017-10-25: 70 mL via INTRAVENOUS

## 2017-10-27 ENCOUNTER — Encounter: Payer: Self-pay | Admitting: Emergency Medicine

## 2017-10-27 ENCOUNTER — Other Ambulatory Visit: Payer: Self-pay

## 2017-10-27 ENCOUNTER — Inpatient Hospital Stay
Admission: EM | Admit: 2017-10-27 | Discharge: 2017-11-06 | DRG: 329 | Disposition: A | Discharge status: Home Health | Payer: Medicare HMO | Attending: Internal Medicine | Admitting: Internal Medicine

## 2017-10-27 DIAGNOSIS — D125 Benign neoplasm of sigmoid colon: Secondary | ICD-10-CM | POA: Diagnosis not present

## 2017-10-27 DIAGNOSIS — I252 Old myocardial infarction: Secondary | ICD-10-CM

## 2017-10-27 DIAGNOSIS — Z7982 Long term (current) use of aspirin: Secondary | ICD-10-CM | POA: Diagnosis not present

## 2017-10-27 DIAGNOSIS — M5136 Other intervertebral disc degeneration, lumbar region: Secondary | ICD-10-CM | POA: Diagnosis present

## 2017-10-27 DIAGNOSIS — D509 Iron deficiency anemia, unspecified: Secondary | ICD-10-CM | POA: Diagnosis present

## 2017-10-27 DIAGNOSIS — C19 Malignant neoplasm of rectosigmoid junction: Secondary | ICD-10-CM | POA: Diagnosis present

## 2017-10-27 DIAGNOSIS — E538 Deficiency of other specified B group vitamins: Secondary | ICD-10-CM | POA: Diagnosis present

## 2017-10-27 DIAGNOSIS — K654 Sclerosing mesenteritis: Secondary | ICD-10-CM | POA: Diagnosis present

## 2017-10-27 DIAGNOSIS — Z833 Family history of diabetes mellitus: Secondary | ICD-10-CM | POA: Diagnosis not present

## 2017-10-27 DIAGNOSIS — Z823 Family history of stroke: Secondary | ICD-10-CM

## 2017-10-27 DIAGNOSIS — I10 Essential (primary) hypertension: Secondary | ICD-10-CM | POA: Diagnosis present

## 2017-10-27 DIAGNOSIS — Z955 Presence of coronary angioplasty implant and graft: Secondary | ICD-10-CM | POA: Diagnosis not present

## 2017-10-27 DIAGNOSIS — E43 Unspecified severe protein-calorie malnutrition: Secondary | ICD-10-CM | POA: Diagnosis present

## 2017-10-27 DIAGNOSIS — C180 Malignant neoplasm of cecum: Principal | ICD-10-CM | POA: Diagnosis present

## 2017-10-27 DIAGNOSIS — D122 Benign neoplasm of ascending colon: Secondary | ICD-10-CM | POA: Diagnosis not present

## 2017-10-27 DIAGNOSIS — M1990 Unspecified osteoarthritis, unspecified site: Secondary | ICD-10-CM | POA: Diagnosis present

## 2017-10-27 DIAGNOSIS — Z01810 Encounter for preprocedural cardiovascular examination: Secondary | ICD-10-CM | POA: Diagnosis not present

## 2017-10-27 DIAGNOSIS — K5669 Other partial intestinal obstruction: Secondary | ICD-10-CM | POA: Diagnosis not present

## 2017-10-27 DIAGNOSIS — Z5320 Procedure and treatment not carried out because of patient's decision for unspecified reasons: Secondary | ICD-10-CM | POA: Diagnosis present

## 2017-10-27 DIAGNOSIS — Z794 Long term (current) use of insulin: Secondary | ICD-10-CM

## 2017-10-27 DIAGNOSIS — E785 Hyperlipidemia, unspecified: Secondary | ICD-10-CM | POA: Diagnosis present

## 2017-10-27 DIAGNOSIS — C182 Malignant neoplasm of ascending colon: Secondary | ICD-10-CM | POA: Diagnosis present

## 2017-10-27 DIAGNOSIS — R1030 Lower abdominal pain, unspecified: Secondary | ICD-10-CM | POA: Diagnosis not present

## 2017-10-27 DIAGNOSIS — R109 Unspecified abdominal pain: Secondary | ICD-10-CM | POA: Diagnosis present

## 2017-10-27 DIAGNOSIS — I2510 Atherosclerotic heart disease of native coronary artery without angina pectoris: Secondary | ICD-10-CM | POA: Diagnosis present

## 2017-10-27 DIAGNOSIS — K561 Intussusception: Secondary | ICD-10-CM | POA: Diagnosis present

## 2017-10-27 DIAGNOSIS — N400 Enlarged prostate without lower urinary tract symptoms: Secondary | ICD-10-CM | POA: Diagnosis present

## 2017-10-27 DIAGNOSIS — C189 Malignant neoplasm of colon, unspecified: Secondary | ICD-10-CM | POA: Diagnosis not present

## 2017-10-27 DIAGNOSIS — E119 Type 2 diabetes mellitus without complications: Secondary | ICD-10-CM | POA: Diagnosis present

## 2017-10-27 DIAGNOSIS — D490 Neoplasm of unspecified behavior of digestive system: Secondary | ICD-10-CM | POA: Diagnosis not present

## 2017-10-27 LAB — CBC WITH DIFFERENTIAL/PLATELET
ABS IMMATURE GRANULOCYTES: 0.03 10*3/uL (ref 0.00–0.07)
BASOS PCT: 0 %
Basophils Absolute: 0 10*3/uL (ref 0.0–0.1)
EOS ABS: 0.2 10*3/uL (ref 0.0–0.5)
Eosinophils Relative: 3 %
HCT: 39.6 % (ref 39.0–52.0)
Hemoglobin: 12.7 g/dL — ABNORMAL LOW (ref 13.0–17.0)
Immature Granulocytes: 0 %
Lymphocytes Relative: 24 %
Lymphs Abs: 1.6 10*3/uL (ref 0.7–4.0)
MCH: 26 pg (ref 26.0–34.0)
MCHC: 32.1 g/dL (ref 30.0–36.0)
MCV: 81.1 fL (ref 80.0–100.0)
MONO ABS: 0.6 10*3/uL (ref 0.1–1.0)
MONOS PCT: 9 %
Neutro Abs: 4.3 10*3/uL (ref 1.7–7.7)
Neutrophils Relative %: 64 %
PLATELETS: 298 10*3/uL (ref 150–400)
RBC: 4.88 MIL/uL (ref 4.22–5.81)
RDW: 14.2 % (ref 11.5–15.5)
WBC: 6.8 10*3/uL (ref 4.0–10.5)
nRBC: 0 % (ref 0.0–0.2)

## 2017-10-27 LAB — COMPREHENSIVE METABOLIC PANEL
ALT: 8 U/L (ref 0–44)
AST: 11 U/L — ABNORMAL LOW (ref 15–41)
Albumin: 3.5 g/dL (ref 3.5–5.0)
Alkaline Phosphatase: 83 U/L (ref 38–126)
Anion gap: 7 (ref 5–15)
BUN: 18 mg/dL (ref 8–23)
CHLORIDE: 103 mmol/L (ref 98–111)
CO2: 26 mmol/L (ref 22–32)
CREATININE: 1.02 mg/dL (ref 0.61–1.24)
Calcium: 9.3 mg/dL (ref 8.9–10.3)
GFR calc non Af Amer: >60 mL/min (ref >60)
Glucose, Bld: 349 mg/dL — ABNORMAL HIGH (ref 70–99)
Potassium: 4.1 mmol/L (ref 3.5–5.1)
Sodium: 136 mmol/L (ref 135–145)
Total Bilirubin: 0.5 mg/dL (ref 0.3–1.2)
Total Protein: 7 g/dL (ref 6.5–8.1)

## 2017-10-27 LAB — GLUCOSE, CAPILLARY
GLUCOSE-CAPILLARY: 190 mg/dL — AB (ref 70–99)
Glucose-Capillary: 239 mg/dL — ABNORMAL HIGH (ref 70–99)

## 2017-10-27 LAB — TROPONIN I

## 2017-10-27 LAB — LIPASE, BLOOD: LIPASE: 23 U/L (ref 11–51)

## 2017-10-27 MED ORDER — INSULIN ASPART 100 UNIT/ML ~~LOC~~ SOLN: 0.0000 [IU] | Freq: Every day | SUBCUTANEOUS | Status: DC

## 2017-10-27 MED ORDER — MORPHINE SULFATE (PF) 2 MG/ML IV SOLN: 2.0000 mg | INTRAVENOUS | Status: DC | PRN

## 2017-10-27 MED ORDER — ENOXAPARIN SODIUM 40 MG/0.4ML ~~LOC~~ SOLN
40.0000 mg | SUBCUTANEOUS | Status: DC
  Administered 2017-10-27 – 2017-10-31 (×5): 40 mg via SUBCUTANEOUS
  Filled 2017-10-27 (×5): qty 0.4

## 2017-10-27 MED ORDER — SODIUM CHLORIDE 0.9 % IV SOLN
INTRAVENOUS | Status: DC
  Administered 2017-10-27 – 2017-10-31 (×5): via INTRAVENOUS

## 2017-10-27 MED ORDER — INSULIN ASPART 100 UNIT/ML ~~LOC~~ SOLN
0.0000 [IU] | Freq: Three times a day (TID) | SUBCUTANEOUS | Status: DC
  Administered 2017-10-27: 3 [IU] via SUBCUTANEOUS
  Administered 2017-10-28: 2 [IU] via SUBCUTANEOUS
  Administered 2017-10-28: 1 [IU] via SUBCUTANEOUS
  Administered 2017-10-28: 2 [IU] via SUBCUTANEOUS
  Administered 2017-10-29: 5 [IU] via SUBCUTANEOUS
  Administered 2017-10-30: 3 [IU] via SUBCUTANEOUS
  Administered 2017-10-31: 1 [IU] via SUBCUTANEOUS
  Administered 2017-10-31: 2 [IU] via SUBCUTANEOUS
  Administered 2017-10-31: 5 [IU] via SUBCUTANEOUS
  Administered 2017-11-01: 2 [IU] via SUBCUTANEOUS
  Filled 2017-10-27 (×9): qty 1

## 2017-10-27 MED ORDER — ASPIRIN EC 81 MG PO TBEC
81.0000 mg | DELAYED_RELEASE_TABLET | Freq: Every day | ORAL | Status: DC
  Administered 2017-10-27 – 2017-10-31 (×4): 81 mg via ORAL
  Filled 2017-10-27 (×5): qty 1

## 2017-10-27 MED ORDER — NITROGLYCERIN 0.4 MG SL SUBL: 0.4000 mg | SUBLINGUAL_TABLET | SUBLINGUAL | Status: DC | PRN

## 2017-10-27 MED ORDER — ONDANSETRON HCL 4 MG PO TABS: 4.0000 mg | ORAL_TABLET | Freq: Four times a day (QID) | ORAL | Status: DC | PRN

## 2017-10-27 MED ORDER — ACETAMINOPHEN 325 MG PO TABS
650.0000 mg | ORAL_TABLET | Freq: Four times a day (QID) | ORAL | Status: DC | PRN
  Administered 2017-10-27 – 2017-11-05 (×6): 650 mg via ORAL
  Filled 2017-10-27 (×5): qty 2

## 2017-10-27 MED ORDER — ACETAMINOPHEN 650 MG RE SUPP: 650.0000 mg | Freq: Four times a day (QID) | RECTAL | Status: DC | PRN

## 2017-10-27 MED ORDER — ONDANSETRON HCL 4 MG/2ML IJ SOLN
4.0000 mg | Freq: Four times a day (QID) | INTRAMUSCULAR | Status: DC | PRN
  Administered 2017-11-01 – 2017-11-03 (×3): 4 mg via INTRAVENOUS
  Filled 2017-10-27: qty 2

## 2017-10-27 MED ORDER — CARVEDILOL 6.25 MG PO TABS
3.1250 mg | ORAL_TABLET | Freq: Two times a day (BID) | ORAL | Status: DC
  Administered 2017-10-27 – 2017-11-06 (×15): 3.125 mg via ORAL
  Filled 2017-10-27 (×17): qty 1

## 2017-10-27 MED ORDER — TAMSULOSIN HCL 0.4 MG PO CAPS
0.4000 mg | ORAL_CAPSULE | Freq: Every day | ORAL | Status: DC
  Administered 2017-10-27 – 2017-11-06 (×10): 0.4 mg via ORAL
  Filled 2017-10-27 (×10): qty 1

## 2017-10-27 NOTE — ED Triage Notes (Signed)
Pt arrived via [place] with daughter, referred here by [facility] after pt had CT scan of abdomen on Wednesday that showed intussusception.  Pt has been having abdominal pain x 3-4 months.  Pt reports minimal bowel movement. Denies any bloody stools, pt states he vomits "about every other night"

## 2017-10-27 NOTE — ED Notes (Signed)
Report to Misty, RN 

## 2017-10-27 NOTE — Consult Note (Signed)
Date of Consultation:  10/27/2017  Requesting Physician:  Abel Presto, MD  Reason for Consultation:  Intussusception  History of Present Illness: [name] is a 77 y.o. male with a chronic history of right lower quadrant pain for about 4-5 months.  He had tried to get outpatient work up for it but had not been able to keep appointments due to various reasons.  He finally had a CT scan on 10/23 as outpatient which showed ileocolic intussusception of terminal ileum into cecum.  There is no surrounding stranding to suggest ischemia and no dilated bowel to suggest obstruction.  He presented to the ED for further management at the request of his PCP and family.  He reports that he is unable to tolerate a diet due to pain and has lost he believes about 20 lbs over the past few months.  He denies any blood in his stool but reports being constipated and can go up to a week without a bowel movement.  His most recent stool was this morning and was pencil thin.  On CT scan, there is stool burden in the left and distal colon.  His laboratory workup was overall normal with a normal WBC and creatinine, but his glucose was very elevated.  He denies any nausea or vomiting.  Past Medical History: Past Medical History:  Diagnosis Date  . BPH (benign prostatic hyperplasia)   . CAD (coronary artery disease)    a. 10/2014 Ant STEMI/PCI: LM nl, LAD 126m (2.75x38 Xience DES) - R->L collats, D1 60, D2 90ost, LCX 20ost, OM1/2/3 min irregs, RCA 55m (2.75x15 Xience DES), 50d, RPDA small;  b. 10/2014 Echo: EF 55-60%, mod ant/ap HK, Gr1 DD.  . Diabetes mellitus without complication ([place])   . Essential hypertension   . Hyperlipidemia   . Osteoarthritis      Past Surgical History: Past Surgical History:  Procedure Laterality Date  . APPENDECTOMY     open  . CARDIAC CATHETERIZATION N/A 10/20/2014   Procedure: Left Heart Cath and Coronary Angiography;  Surgeon: Wellington Hampshire, MD;  Location: [facility];  Service: Cardiovascular;  Laterality: N/A;  . CARDIAC CATHETERIZATION Right 10/20/2014   Procedure: Coronary Stent Intervention;  Surgeon: Wellington Hampshire, MD;  Location: [facility];  Service: Cardiovascular;  Laterality: Right;  LAD/RCA Stent placement  . CHOLECYSTECTOMY, LAPAROSCOPIC      Home Medications: Prior to Admission medications   Medication Sig Start Date End Date Taking? Authorizing Provider  aspirin 81 MG tablet Take 81 mg by mouth daily.   Yes [provider]  carvedilol (COREG) 3.125 MG tablet Take 1 tablet (3.125 mg total) by mouth 2 (two) times daily with a meal. 11/25/14  Yes Theora Gianotti, NP  Dulaglutide (TRULICITY) 1.5 XI/3.3AS SOPN Inject 1.5 mg into the skin every Wednesday.    Yes [provider]  glucagon (GLUCAGON EMERGENCY) 1 MG injection Inject 1 mg into the vein once as needed.   Yes [provider]  insulin aspart (NOVOLOG) 100 UNIT/ML injection Inject 20 Units into the skin 3 (three) times daily with meals.    Yes [provider]  insulin glargine (LANTUS) 100 UNIT/ML injection Inject 38 Units into the skin at bedtime.    Yes [provider]  metFORMIN (GLUCOPHAGE) 500 MG tablet Take 1,000 mg by mouth 2 (two) times daily with a meal.   Yes [provider]  nitroGLYCERIN (NITROSTAT) 0.4 MG SL tablet Place 1 tablet (0.4 mg total)  under the tongue every 5 (five) minutes as needed for chest pain. 11/25/14  Yes Theora Gianotti, NP  tamsulosin (FLOMAX) 0.4 MG CAPS capsule Take 0.4 mg by mouth daily.   Yes [provider]  clopidogrel (PLAVIX) 75 MG tablet Take 1 tablet (75 mg total) by mouth daily. Patient not taking: Reported on 10/27/2017 05/24/16   Wellington Hampshire, MD    Allergies: No Known Allergies  Social History:  reports that he has never smoked. He has never used smokeless tobacco. He reports that he does not drink alcohol or use drugs.   Family  History: Family History  Problem Relation Age of Onset  . Stroke Mother   . Diabetes Sister   . Diabetes Brother     Review of Systems: Review of Systems  Constitutional: Negative for chills and fever.  HENT: Negative for hearing loss.   Eyes: Negative for blurred vision.  Respiratory: Negative for shortness of breath.   Cardiovascular: Negative for chest pain.  Gastrointestinal: Positive for abdominal pain and constipation. Negative for blood in stool, diarrhea, nausea and vomiting.  Genitourinary: Negative for dysuria.  Musculoskeletal: Negative for myalgias.  Skin: Negative for rash.  Neurological: Negative for dizziness.  Psychiatric/Behavioral: Negative for depression.    Physical Exam BP (!) 183/76   Pulse 62   Temp 97.7 F (36.5 C) (Oral)   Resp 18   Ht 5\' 9"  (1.753 m)   Wt 65.3 kg   SpO2 100%   BMI 21.27 kg/m  CONSTITUTIONAL: No acute distress HEENT:  Normocephalic, atraumatic, extraocular motion intact. NECK: Trachea is midline, and there is no jugular venous distension. RESPIRATORY:  Lungs are clear, and breath sounds are equal bilaterally. Normal respiratory effort without pathologic use of accessory muscles. CARDIOVASCULAR: Heart is regular without murmurs, gallops, or rubs. GI: The abdomen is soft, non-distended, with mild tenderness in the right lower quadrant.  Non-peritoneal at this point.  MUSCULOSKELETAL:  Normal muscle strength and tone in all four extremities.  No peripheral edema or cyanosis. SKIN: Skin turgor is normal. There are no pathologic skin lesions.  NEUROLOGIC:  Motor and sensation is grossly normal.  Cranial nerves are grossly intact. PSYCH:  Alert and oriented to person, place and time. Affect is normal.  Laboratory Analysis: Results for orders placed or performed during the hospital encounter of 10/27/17 (from the past 24 hour(s))  CBC with Differential     Status: Abnormal   Collection Time: 10/27/17  2:15 PM  Result Value Ref Range    WBC 6.8 4.0 - 10.5 K/uL   RBC 4.88 4.22 - 5.81 MIL/uL   Hemoglobin 12.7 (L) 13.0 - 17.0 g/dL   HCT 39.6 39.0 - 52.0 %   MCV 81.1 80.0 - 100.0 fL   MCH 26.0 26.0 - 34.0 pg   MCHC 32.1 30.0 - 36.0 g/dL   RDW 14.2 11.5 - 15.5 %   Platelets 298 150 - 400 K/uL   nRBC 0.0 0.0 - 0.2 %   Neutrophils Relative % 64 %   Neutro Abs 4.3 1.7 - 7.7 K/uL   Lymphocytes Relative 24 %   Lymphs Abs 1.6 0.7 - 4.0 K/uL   Monocytes Relative 9 %   Monocytes Absolute 0.6 0.1 - 1.0 K/uL   Eosinophils Relative 3 %   Eosinophils Absolute 0.2 0.0 - 0.5 K/uL   Basophils Relative 0 %   Basophils Absolute 0.0 0.0 - 0.1 K/uL   Immature Granulocytes 0 %   Abs Immature Granulocytes 0.03 0.00 -  0.07 K/uL  Comprehensive metabolic panel     Status: Abnormal   Collection Time: 10/27/17  2:15 PM  Result Value Ref Range   Sodium 136 135 - 145 mmol/L   Potassium 4.1 3.5 - 5.1 mmol/L   Chloride 103 98 - 111 mmol/L   CO2 26 22 - 32 mmol/L   Glucose, Bld 349 (H) 70 - 99 mg/dL   BUN 18 8 - 23 mg/dL   Creatinine, Ser 1.02 0.61 - 1.24 mg/dL   Calcium 9.3 8.9 - 10.3 mg/dL   Total Protein 7.0 6.5 - 8.1 g/dL   Albumin 3.5 3.5 - 5.0 g/dL   AST 11 (L) 15 - 41 U/L   ALT 8 0 - 44 U/L   Alkaline Phosphatase 83 38 - 126 U/L   Total Bilirubin 0.5 0.3 - 1.2 mg/dL   GFR calc non Af Amer >60 >60 mL/min   GFR calc Af Amer >60 >60 mL/min   Anion gap 7 5 - 15  Lipase, blood     Status: None   Collection Time: 10/27/17  2:15 PM  Result Value Ref Range   Lipase 23 11 - 51 U/L  Troponin I     Status: None   Collection Time: 10/27/17  2:15 PM  Result Value Ref Range   Troponin I <0.03 <0.03 ng/mL  Glucose, capillary     Status: Abnormal   Collection Time: 10/27/17  5:39 PM  Result Value Ref Range   Glucose-Capillary 239 (H) 70 - 99 mg/dL    Imaging: CT chest, abdomen, pelvis 10/23: IMPRESSION: 1. Short-segment ileocolic intussusception. No dilated small bowel to suggest obstruction. I cannot exclude a malignant mass or  polyp in the vicinity of the cecum/ileocecal valve, and usually intussusceptions in adults do have an underlying lesion. Gastroenterology or surgical referral recommended. 2. Stranding in the central mesentery is observed with a halo of adipose tissue around the small central mesenteric lymph nodes involved, favoring sclerosing mesenteritis. 3. Other imaging findings of potential clinical significance: Aortic Atherosclerosis (ICD10-I70.0). Coronary atherosclerosis. Prominent stool throughout the colon favors constipation. Mild prostatomegaly. Mild lower lumbar impingement due to spondylosis and degenerative disc disease.  Assessment and Plan: This is a 77 y.o. male with ileocolic intussusception.  I have independently viewed the patient's imaging study and reviewed the patient's laboratory studies.  The patient's CT scan as mentioned, shows intussusception but no bowel obstruction or stranding to suggest ischemia.  No emergency surgery needed at this point.  The etiology of intussusception is unclear, but could be a mass.  He has had an open appendectomy in the past, and could possibly be scarring as the lead point.  The patient has a GI consult pending.  There could be a role for colonoscopy given his weight loss and to see if there is any oncologic etiology to this.  This could help in surgical planning.  Overall he would require some form of resection, whether it is ileocecectomy or ileocolectomy.  For now, ok to keep NPO, IV fluid hydration, and appropriate pain control.  Face-to-face time spent with the patient and care providers was 80 minutes, with more than 50% of the time spent counseling, educating, and coordinating care of the patient.     Melvyn Neth, MD [facility] Pg:  [phone]

## 2017-10-27 NOTE — ED Provider Notes (Signed)
[facility] Emergency Department Provider Note   ____________________________________________    I have reviewed the triage vital signs and the nursing notes.   HISTORY  Chief Complaint Abdominal Pain     HPI JADARIOUS DOBBINS is a 77 y.o. male who presents with complaints of abdominal pain.  Patient reports he was sent in by his PCP, had a CT scan 2 days ago which was reportedly was abnormal.  Review of CT scan of medical records demonstrates intussusception, ileocolic.  Patient complains of several months of pain which is worsened primarily in the right lower quadrant.  Worse with eating.  Nothing seems to make it better.  Occasional nausea.   Past Medical History:  Diagnosis Date  . BPH (benign prostatic hyperplasia)   . CAD (coronary artery disease)    a. 10/2014 Ant STEMI/PCI: LM nl, LAD 166m (2.75x38 Xience DES) - R->L collats, D1 60, D2 90ost, LCX 20ost, OM1/2/3 min irregs, RCA 46m (2.75x15 Xience DES), 50d, RPDA small;  b. 10/2014 Echo: EF 55-60%, mod ant/ap HK, Gr1 DD.  . Diabetes mellitus without complication ([place])   . Essential hypertension   . Hyperlipidemia   . Osteoarthritis     Patient Active Problem List   Diagnosis Date Noted  . Rhabdomyolysis 06/16/2016  . Acute encephalopathy 06/16/2016  . Pressure injury of skin 06/16/2016  . Nausea and vomiting 07/31/2015  . H. pylori infection 07/31/2015  . Odynophagia 07/31/2015  . Generalized weakness 07/31/2015  . Dehydration 07/31/2015  . Acute kidney injury ([place]) 07/28/2015  . CAD (coronary artery disease)   . Essential hypertension   . Hypoglycemia associated with diabetes ([place])   . S/P coronary artery stent placement   . Type 2 diabetes mellitus with circulatory disorder ([place])   . Hyperlipidemia   . ST elevation myocardial infarction (STEMI) of anterior wall ([place]) 10/20/2014  . ST elevation (STEMI) myocardial infarction involving left anterior descending coronary artery [facility])       Past Surgical History:  Procedure Laterality Date  . CARDIAC CATHETERIZATION N/A 10/20/2014   Procedure: Left Heart Cath and Coronary Angiography;  Surgeon: Wellington Hampshire, MD;  Location: [place] [facility];  Service: Cardiovascular;  Laterality: N/A;  . CARDIAC CATHETERIZATION Right 10/20/2014   Procedure: Coronary Stent Intervention;  Surgeon: Wellington Hampshire, MD;  Location: [place] [facility];  Service: Cardiovascular;  Laterality: Right;  LAD/RCA Stent placement    Prior to Admission medications   Medication Sig Start Date End Date Taking? Authorizing Provider  aspirin 81 MG tablet Take 81 mg by mouth daily.    [provider]  atorvastatin (LIPITOR) 80 MG tablet Take 80 mg by mouth daily.    [provider]  carvedilol (COREG) 3.125 MG tablet Take 1 tablet (3.125 mg total) by mouth 2 (two) times daily with a meal. Patient not taking: Reported on 06/16/2016 11/25/14   Theora Gianotti, NP  clopidogrel (PLAVIX) 75 MG tablet Take 1 tablet (75 mg total) by mouth daily. 05/24/16   Wellington Hampshire, MD  docusate sodium (COLACE) 100 MG capsule Take 1 capsule (100 mg total) by mouth 2 (two) times daily. 07/31/15   Theodoro Grist, MD  Dulaglutide (TRULICITY) 2.44 WN/0.2VO SOPN Inject 0.75 mg into the skin every 7 (seven) days.    [provider]  fluticasone (FLONASE) 50 MCG/ACT nasal spray Place 2 sprays into both nostrils daily.    [provider]  glucagon (GLUCAGON EMERGENCY) 1 MG injection Inject 1  mg into the vein once as needed.    [provider]  glucose (B-D GLUCOSE) 5 g chewable tablet Chew 3 tablets (15 g total) by mouth as needed for low blood sugar. 07/31/15 07/30/16  Theodoro Grist, MD  insulin aspart (NOVOLOG) 100 UNIT/ML injection Inject 16 units with meals three times daily. MDD 100 units. 11/19/15   [provider]  insulin glargine (LANTUS) 100 UNIT/ML injection Inject 38 Units into the skin.  01/18/16 01/18/17   [provider]  LANTUS 100 UNIT/ML injection Inject 0.25 mLs (25 Units total) into the skin every evening. Patient not taking: Reported on 06/16/2016 07/31/15   Theodoro Grist, MD  nitroGLYCERIN (NITROSTAT) 0.4 MG SL tablet Place 1 tablet (0.4 mg total) under the tongue every 5 (five) minutes as needed for chest pain. 11/25/14   Theora Gianotti, NP  ondansetron (ZOFRAN ODT) 4 MG disintegrating tablet Take 1 tablet (4 mg total) by mouth every 8 (eight) hours as needed for nausea or vomiting. Patient not taking: Reported on 06/16/2016 07/20/15   Joanne Gavel, MD  sildenafil (REVATIO) 20 MG tablet Take 20 mg by mouth. 05/12/16   [provider]  tamsulosin (FLOMAX) 0.4 MG CAPS capsule Take 0.4 mg by mouth daily.    [provider]     Allergies Patient has no known allergies.  Family History  Problem Relation Age of Onset  . Stroke Mother   . Diabetes Sister   . Diabetes Brother     Social History Social History   Tobacco Use  . Smoking status: Never Smoker  . Smokeless tobacco: Never Used  Substance Use Topics  . Alcohol use: No  . Drug use: No    Review of Systems  Constitutional: No fever/chills Eyes: No visual changes.  ENT: No sore throat. Cardiovascular: Denies chest pain. Respiratory: Denies shortness of breath. Gastrointestinal: As above.   Genitourinary: Negative for dysuria. Musculoskeletal: Negative for back pain. Skin: Negative for rash. Neurological: Negative for headaches or weakness   ____________________________________________   PHYSICAL EXAM:  VITAL SIGNS: ED Triage Vitals  Enc Vitals Group     BP 10/27/17 1405 105/62     Pulse Rate 10/27/17 1405 81     Resp 10/27/17 1405 18     Temp 10/27/17 1405 98 F (36.7 C)     Temp Source 10/27/17 1405 Oral     SpO2 10/27/17 1405 98 %     Weight 10/27/17 1405 65.3 kg (144 lb)     Height 10/27/17 1405 1.753 m (5\' 9" )     Head Circumference --      Peak Flow --       Pain Score 10/27/17 1404 8     Pain Loc --      Pain Edu? --      Excl. in [place]? --     Constitutional: Alert and oriented.  Eyes: Conjunctivae are normal.   Nose: No congestion/rhinnorhea. Mouth/Throat: Mucous membranes are moist.   Neck:  Painless ROM Cardiovascular: Normal rate, regular rhythm. Grossly normal heart sounds.  Good peripheral circulation. Respiratory: Normal respiratory effort.  No retractions.  Gastrointestinal: Tender to palpation right lower quadrant, no distention  Musculoskeletal:  Warm and well perfused Neurologic:  Normal speech and language. No gross focal neurologic deficits are appreciated.  Skin:  Skin is warm, dry and intact. No rash noted. Psychiatric: Mood and affect are normal. Speech and behavior are normal.  ____________________________________________   LABS (all labs ordered are listed, but  only abnormal results are displayed)  Labs Reviewed  CBC WITH DIFFERENTIAL/PLATELET - Abnormal; Notable for the following components:      Result Value   Hemoglobin 12.7 (*)    All other components within normal limits  COMPREHENSIVE METABOLIC PANEL  LIPASE, BLOOD  TROPONIN I  URINALYSIS, COMPLETE (UACMP) WITH MICROSCOPIC   ____________________________________________  EKG  None ____________________________________________  RADIOLOGY  Reviewed CT scan done 2 days ago ____________________________________________   PROCEDURES  Procedure(s) performed: No  Procedures   Critical Care performed: No ____________________________________________   INITIAL IMPRESSION / ASSESSMENT AND PLAN / ED COURSE  Pertinent labs & imaging results that were available during my care of the patient were reviewed by me and considered in my medical decision making (see chart for details).  Patient sent in for CT scan demonstrating intussusception, ileocolic.  Concern for malignancy.  Discussed with Dr. Vicente Males of gastroenterology, recommends admission.  Discussed  with Dr. Verdell Carmine of hospitalist service will admit the patient    ____________________________________________   FINAL CLINICAL IMPRESSION(S) / ED DIAGNOSES  Final diagnoses:  Intussusception [facility])        Note:  This document was prepared using Dragon voice recognition software and may include unintentional dictation errors.    Lavonia Drafts, MD 10/27/17 1455

## 2017-10-27 NOTE — H&P (Signed)
[name] at [place] NAME: [name]    MR#:  [id number]  DATE OF BIRTH:  January 09, 1940  DATE OF ADMISSION:  10/27/2017  PRIMARY CARE PHYSICIAN: Valera Castle, MD   REQUESTING/REFERRING PHYSICIAN: Dr. Lavonia Drafts  CHIEF COMPLAINT:   Chief Complaint  Patient presents with  . Abdominal Pain    HISTORY OF PRESENT ILLNESS:  [name]  is a 77 y.o. male with a known history of BPH, essential hypertension, diabetes, history of coronary artery disease, hyperlipidemia, osteoarthritis who presents to the hospital complaining of abdominal pain.  Patient's abdominal pain has been ongoing now for the past few months.  Patient was supposed to get a CT scan done as an outpatient but it kept getting rescheduled as patient had prior obligations, he then has a CT done 2 days ago as an outpatient which showed intussusception in the ileocolic area.  Patient also says that he has been having weight loss over the past few months lost about 20 to 25 pounds.  Patient also complains of intermittent nausea, and says his abdominal pain is present pretty much all day and gets worse with eating, and he has not been able to eat and sleep well for months.  He presents to the hospital given there is abnormal CT abdomen pelvis findings and hospitalist services were contacted for admission.  She denies any melena, hematochezia, fever, chills, cough, congestion or any other associated symptoms presently.  PAST MEDICAL HISTORY:   Past Medical History:  Diagnosis Date  . BPH (benign prostatic hyperplasia)   . CAD (coronary artery disease)    a. 10/2014 Ant STEMI/PCI: LM nl, LAD 128m (2.75x38 Xience DES) - R->L collats, D1 60, D2 90ost, LCX 20ost, OM1/2/3 min irregs, RCA 45m (2.75x15 Xience DES), 50d, RPDA small;  b. 10/2014 Echo: EF 55-60%, mod ant/ap HK, Gr1 DD.  . Diabetes mellitus without complication ([place])   . Essential hypertension   . Hyperlipidemia   .  Osteoarthritis     PAST SURGICAL HISTORY:   Past Surgical History:  Procedure Laterality Date  . CARDIAC CATHETERIZATION N/A 10/20/2014   Procedure: Left Heart Cath and Coronary Angiography;  Surgeon: Wellington Hampshire, MD;  Location: [place] [facility];  Service: Cardiovascular;  Laterality: N/A;  . CARDIAC CATHETERIZATION Right 10/20/2014   Procedure: Coronary Stent Intervention;  Surgeon: Wellington Hampshire, MD;  Location: [place] [facility];  Service: Cardiovascular;  Laterality: Right;  LAD/RCA Stent placement    SOCIAL HISTORY:   Social History   Tobacco Use  . Smoking status: Never Smoker  . Smokeless tobacco: Never Used  Substance Use Topics  . Alcohol use: No    FAMILY HISTORY:   Family History  Problem Relation Age of Onset  . Stroke Mother   . Diabetes Sister   . Diabetes Brother     DRUG ALLERGIES:  No Known Allergies  REVIEW OF SYSTEMS:   Review of Systems  Constitutional: Positive for weight loss. Negative for chills and fever.  HENT: Negative for congestion and tinnitus.   Eyes: Negative for blurred vision and double vision.  Respiratory: Negative for cough, shortness of breath and wheezing.   Cardiovascular: Negative for chest pain, orthopnea and PND.  Gastrointestinal: Positive for abdominal pain. Negative for diarrhea, nausea and vomiting.  Genitourinary: Negative for dysuria and hematuria.  Neurological: Negative for dizziness, sensory change and focal weakness.  All other systems reviewed and are negative.   MEDICATIONS AT HOME:  Prior to Admission medications   Medication Sig Start Date End Date Taking? Authorizing Provider  aspirin 81 MG tablet Take 81 mg by mouth daily.   Yes [provider]  carvedilol (COREG) 3.125 MG tablet Take 1 tablet (3.125 mg total) by mouth 2 (two) times daily with a meal. 11/25/14  Yes Theora Gianotti, NP  Dulaglutide (TRULICITY) 1.5 EX/9.3ZJ SOPN Inject 1.5 mg into the skin every Wednesday.     Yes [provider]  glucagon (GLUCAGON EMERGENCY) 1 MG injection Inject 1 mg into the vein once as needed.   Yes [provider]  insulin aspart (NOVOLOG) 100 UNIT/ML injection Inject 20 Units into the skin 3 (three) times daily with meals.    Yes [provider]  insulin glargine (LANTUS) 100 UNIT/ML injection Inject 38 Units into the skin at bedtime.    Yes [provider]  metFORMIN (GLUCOPHAGE) 500 MG tablet Take 1,000 mg by mouth 2 (two) times daily with a meal.   Yes [provider]  nitroGLYCERIN (NITROSTAT) 0.4 MG SL tablet Place 1 tablet (0.4 mg total) under the tongue every 5 (five) minutes as needed for chest pain. 11/25/14  Yes Theora Gianotti, NP  tamsulosin (FLOMAX) 0.4 MG CAPS capsule Take 0.4 mg by mouth daily.   Yes [provider]  clopidogrel (PLAVIX) 75 MG tablet Take 1 tablet (75 mg total) by mouth daily. Patient not taking: Reported on 10/27/2017 05/24/16   Wellington Hampshire, MD      VITAL SIGNS:  Blood pressure 105/62, pulse 81, temperature 98 F (36.7 C), temperature source Oral, resp. rate 18, height 5\' 9"  (1.753 m), weight 65.3 kg, SpO2 98 %.  PHYSICAL EXAMINATION:  Physical Exam  GENERAL:  77 y.o.-year-old thin patient lying in the bed with no acute distress.  EYES: Pupils equal, round, reactive to light and accommodation. No scleral icterus. Extraocular muscles intact.  HEENT: Head atraumatic, normocephalic. Oropharynx and nasopharynx clear. No oropharyngeal erythema, moist oral mucosa  NECK:  Supple, no jugular venous distention. No thyroid enlargement, no tenderness.  LUNGS: Normal breath sounds bilaterally, no wheezing, rales, rhonchi. No use of accessory muscles of respiration.  CARDIOVASCULAR: S1, S2 RRR. No murmurs, rubs, gallops, clicks.  ABDOMEN: Soft, Tender in the RUQ, Epigatric area, no rebound, rigidity, nondistended. Bowel sounds present. No organomegaly or mass.  EXTREMITIES: No  pedal edema, cyanosis, or clubbing. + 2 pedal & radial pulses b/l.   NEUROLOGIC: Cranial nerves II through XII are intact. No focal Motor or sensory deficits appreciated b/l PSYCHIATRIC: The patient is alert and oriented x 3.  SKIN: No obvious rash, lesion, or ulcer.   LABORATORY PANEL:   CBC Recent Labs  Lab 10/27/17 1415  WBC 6.8  HGB 12.7*  HCT 39.6  PLT 298   ------------------------------------------------------------------------------------------------------------------  Chemistries  Recent Labs  Lab 10/27/17 1415  NA 136  K 4.1  CL 103  CO2 26  GLUCOSE 349*  BUN 18  CREATININE 1.02  CALCIUM 9.3  AST 11*  ALT 8  ALKPHOS 83  BILITOT 0.5   ------------------------------------------------------------------------------------------------------------------  Cardiac Enzymes Recent Labs  Lab 10/27/17 1415  TROPONINI <0.03   ------------------------------------------------------------------------------------------------------------------  RADIOLOGY:  No results found.   IMPRESSION AND PLAN:   77 year old male with past medical history of diabetes, essential hypertension, hyperlipidemia, osteoarthritis, BPH, history of coronary artery disease who presents to the hospital due to abdominal pain and weight loss.  1.  Abdominal pain-suspected to be secondary to the intussusception and possible  underlying malignancy.  Patient has had symptoms for over 2 months now and CT scan is suggestive of ileocolic intussusception.  Patient also has had significant weight loss associated with it. - Patient will likely need a colonoscopy for further evaluation.  Will get gastroenterology consult, also get surgical consult.  Supportive care with IV fluids, clear liquid diet and pain control.  2.  Diabetes type 2 without complication- hold patient's schedule insulin for now. -We will place patient on sliding scale insulin and follow blood sugars.  3.  3 of BPH-continue Flomax.  4.   Essential hypertension-continue carvedilol.    All the records are reviewed and case discussed with ED provider. Management plans discussed with the patient, family and they are in agreement.  CODE STATUS: Full code  TOTAL TIME TAKING CARE OF THIS PATIENT: 45 minutes.    Henreitta Leber M.D on 10/27/2017 at 3:38 PM  Between 7am to 6pm - Pager - [phone]  After 6pm go to [email or web address] - password [email or web address] [place] [facility]  Office  [phone]  CC: Primary care physician; Valera Castle, MD

## 2017-10-28 DIAGNOSIS — K561 Intussusception: Secondary | ICD-10-CM

## 2017-10-28 LAB — FOLATE: Folate: 11.4 ng/mL (ref >5.9)

## 2017-10-28 LAB — BASIC METABOLIC PANEL
Anion gap: 6 (ref 5–15)
BUN: 14 mg/dL (ref 8–23)
CO2: 26 mmol/L (ref 22–32)
Calcium: 8.6 mg/dL — ABNORMAL LOW (ref 8.9–10.3)
Chloride: 108 mmol/L (ref 98–111)
Creatinine, Ser: 0.95 mg/dL (ref 0.61–1.24)
GFR calc Af Amer: >60 mL/min (ref >60)
Glucose, Bld: 196 mg/dL — ABNORMAL HIGH (ref 70–99)
Potassium: 3.9 mmol/L (ref 3.5–5.1)
SODIUM: 140 mmol/L (ref 135–145)

## 2017-10-28 LAB — FERRITIN: Ferritin: 26 ng/mL (ref 24–336)

## 2017-10-28 LAB — IRON AND TIBC
Iron: 52 ug/dL (ref 45–182)
Saturation Ratios: 18 % (ref 17.9–39.5)
TIBC: 287 ug/dL (ref 250–450)
UIBC: 235 ug/dL

## 2017-10-28 LAB — CBC
HCT: 35.9 % — ABNORMAL LOW (ref 39.0–52.0)
Hemoglobin: 11.3 g/dL — ABNORMAL LOW (ref 13.0–17.0)
MCH: 25.9 pg — ABNORMAL LOW (ref 26.0–34.0)
MCHC: 31.5 g/dL (ref 30.0–36.0)
MCV: 82.2 fL (ref 80.0–100.0)
Platelets: 231 10*3/uL (ref 150–400)
RBC: 4.37 MIL/uL (ref 4.22–5.81)
RDW: 14 % (ref 11.5–15.5)
WBC: 7.4 10*3/uL (ref 4.0–10.5)
nRBC: 0 % (ref 0.0–0.2)

## 2017-10-28 LAB — VITAMIN B12: VITAMIN B 12: 194 pg/mL (ref 180–914)

## 2017-10-28 LAB — GLUCOSE, CAPILLARY
GLUCOSE-CAPILLARY: 139 mg/dL — AB (ref 70–99)
GLUCOSE-CAPILLARY: 158 mg/dL — AB (ref 70–99)
GLUCOSE-CAPILLARY: 110 mg/dL — AB (ref 70–99)
Glucose-Capillary: 173 mg/dL — ABNORMAL HIGH (ref 70–99)

## 2017-10-28 MED ORDER — PEG 3350-KCL-NA BICARB-NACL 420 G PO SOLR
4000.0000 mL | Freq: Once | ORAL | Status: AC
  Administered 2017-10-28: 4000 mL via ORAL
  Filled 2017-10-28: qty 4000

## 2017-10-28 NOTE — Progress Notes (Signed)
10/28/2017  Subjective: No acute events overnight.  Patient reports that his pain today is a little bit better.  He is currently having some liquids this morning for breakfast.  Has any nausea vomiting.  He did say that he had a very brief episode of discomfort in the right lower quadrant overnight but it went away on its own.  Vital signs: Temp:  [97.7 F (36.5 C)-98.5 F (36.9 C)] 98.5 F (36.9 C) (10/26 1209) Pulse Rate:  [57-62] 57 (10/26 1209) Resp:  [16-20] 17 (10/26 1209) BP: (101-183)/(55-76) 103/65 (10/26 1209) SpO2:  [98 %-100 %] 99 % (10/26 1209)   Intake/Output: 10/25 0701 - 10/26 0700 In: 555.6 [I.V.:555.6] Out: -  Last BM Date: 10/27/17  Physical Exam: Constitutional: No acute distress Abdomen: Soft, nondistended, nontender to palpation with only some mild discomfort and soreness on the right lower quadrant.  This is much improved compared to yesterday.  Labs:  Recent Labs    10/27/17 1415 10/28/17 0730  WBC 6.8 7.4  HGB 12.7* 11.3*  HCT 39.6 35.9*  PLT 298 231   Recent Labs    10/27/17 1415 10/28/17 0730  NA 136 140  K 4.1 3.9  CL 103 108  CO2 26 26  GLUCOSE 349* 196*  BUN 18 14  CREATININE 1.02 0.95  CALCIUM 9.3 8.6*   No results for input(s): LABPROT, INR in the last 72 hours.  Imaging: No results found.  Assessment/Plan: This is a 77 y.o. male with ileocolic intussusception.  His labs this morning are still within normal and his abdominal discomfort is much better today.  At this point are still no urgent surgical need and would favor colonoscopy for diagnosis for the etiology of the intussusception.  GI consult is pending this morning.  Once this is done, then I can discuss with the patient further about an ileocolectomy during this admission.   Melvyn Neth, [facility]

## 2017-10-28 NOTE — Consult Note (Signed)
Cephas Darby, MD [address]  [place]  [place], [place] [address]  Main: [phone]  Fax: [phone] Pager: [phone]   Consultation  Referring Provider:     No ref. provider found Primary Care Physician:  Valera Castle, MD Primary Gastroenterologist: None         Reason for Consultation:     Ileocolonic intussusception, evaluate for colonoscopy  Date of Admission:  10/27/2017 Date of Consultation:  10/28/2017         HPI:   [name] is a 77 y.o. Caucasian male with history of metabolic syndrome, coronary disease, status post PCI on DAPT who presents with progressively worsening lower abdominal pain associated with weight loss, loss of appetite over the past 4 to 5 months.  Patient lost about 15 pounds.  He reports having regular bowel movements, last bowel movement was yesterday in the emergency room.  Denies rectal bleeding, hematochezia or melena.  He denies having had a colonoscopy in the past. In the ER, he underwent a CT A/P which revealed ileocolonic intussusception, underlying mass or polyp could not be excluded.  There was no evidence of small bowel dilatation.  There was prominent stool throughout the colon.  There was no evidence of obstruction.  Surgery was consulted who recommended ileocecectomy or ileocolectomy.  However, GI is consulted for possible colonoscopy to evaluate for any right colonic mass prior to surgery Patient has history of appendectomy Patient is tolerating clear liquid diet, reports having abdominal pain but denies nausea or vomiting.  He reports passing flatus and having bowel movements.  His daughter is bedside   NSAIDs: None  Antiplts/Anticoagulants/Anti thrombotics: Aspirin and Plavix for coronary artery disease  GI Procedures: None  Past Medical History:  Diagnosis Date  . BPH (benign prostatic hyperplasia)   . CAD (coronary artery disease)    a. 10/2014 Ant STEMI/PCI: LM nl, LAD 116m(2.75x38 Xience DES) -  R->L collats, D1 60, D2 90ost, LCX 20ost, OM1/2/3 min irregs, RCA 916m2.75x15 Xience DES), 50d, RPDA small;  b. 10/2014 Echo: EF 55-60%, mod ant/ap HK, Gr1 DD.  . Diabetes mellitus without complication ([place]  . Essential hypertension   . Hyperlipidemia   . Osteoarthritis     Past Surgical History:  Procedure Laterality Date  . APPENDECTOMY     open  . CARDIAC CATHETERIZATION N/A 10/20/2014   Procedure: Left Heart Cath and Coronary Angiography;  Surgeon: MuWellington HampshireMD;  [facility];  Service: Cardiovascular;  Laterality: N/A;  . CARDIAC CATHETERIZATION Right 10/20/2014   Procedure: Coronary Stent Intervention;  Surgeon: MuWellington HampshireMD;  [facility];  Service: Cardiovascular;  Laterality: Right;  LAD/RCA Stent placement  . CHOLECYSTECTOMY, LAPAROSCOPIC      Prior to Admission medications   Medication Sig Start Date End Date Taking? Authorizing Provider  aspirin 81 MG tablet Take 81 mg by mouth daily.   Yes [provider]  carvedilol (COREG) 3.125 MG tablet Take 1 tablet (3.125 mg total) by mouth 2 (two) times daily with a meal. 11/25/14  Yes BeTheora GianottiNP  Dulaglutide (TRULICITY) 1.5 MGJK/9.3OIOPN Inject 1.5 mg into the skin every Wednesday.    Yes [provider]  glucagon (GLUCAGON EMERGENCY) 1 MG injection Inject 1 mg into the vein once as needed.   Yes [provider]  insulin aspart (NOVOLOG) 100 UNIT/ML injection Inject 20 Units into the skin 3 (three) times daily with  meals.    Yes [provider]  insulin glargine (LANTUS) 100 UNIT/ML injection Inject 38 Units into the skin at bedtime.    Yes [provider]  metFORMIN (GLUCOPHAGE) 500 MG tablet Take 1,000 mg by mouth 2 (two) times daily with a meal.   Yes [provider]  nitroGLYCERIN (NITROSTAT) 0.4 MG SL tablet Place 1 tablet (0.4 mg total) under the tongue every 5 (five) minutes as needed for chest pain.  11/25/14  Yes Theora Gianotti, NP  tamsulosin (FLOMAX) 0.4 MG CAPS capsule Take 0.4 mg by mouth daily.   Yes [provider]  clopidogrel (PLAVIX) 75 MG tablet Take 1 tablet (75 mg total) by mouth daily. Patient not taking: Reported on 10/27/2017 05/24/16   Wellington Hampshire, MD    Current Facility-Administered Medications:  .  0.9 %  sodium chloride infusion, , Intravenous, Continuous, Sainani, Belia Heman, MD, Last Rate: 50 mL/hr at 10/28/17 1257 .  acetaminophen (TYLENOL) tablet 650 mg, 650 mg, Oral, Q6H PRN, 650 mg at 10/28/17 0902 **OR** acetaminophen (TYLENOL) suppository 650 mg, 650 mg, Rectal, Q6H PRN, Sainani, Vivek J, MD .  aspirin EC tablet 81 mg, 81 mg, Oral, Daily, Henreitta Leber, MD, 81 mg at 10/28/17 0856 .  carvedilol (COREG) tablet 3.125 mg, 3.125 mg, Oral, BID WC, Henreitta Leber, MD, 3.125 mg at 10/28/17 0856 .  enoxaparin (LOVENOX) injection 40 mg, 40 mg, Subcutaneous, Q24H, Sainani, Belia Heman, MD, 40 mg at 10/27/17 2120 .  insulin aspart (novoLOG) injection 0-5 Units, 0-5 Units, Subcutaneous, QHS, Sainani, Vivek J, MD .  insulin aspart (novoLOG) injection 0-9 Units, 0-9 Units, Subcutaneous, TID WC, Henreitta Leber, MD, 1 Units at 10/28/17 1228 .  morphine 2 MG/ML injection 2 mg, 2 mg, Intravenous, Q4H PRN, Sainani, Belia Heman, MD .  nitroGLYCERIN (NITROSTAT) SL tablet 0.4 mg, 0.4 mg, Sublingual, Q5 min PRN, Sainani, Vivek J, MD .  ondansetron (ZOFRAN) tablet 4 mg, 4 mg, Oral, Q6H PRN **OR** ondansetron (ZOFRAN) injection 4 mg, 4 mg, Intravenous, Q6H PRN, Sainani, Vivek J, MD .  polyethylene glycol-electrolytes (NuLYTELY/GoLYTELY) solution 4,000 mL, 4,000 mL, Oral, Once, Orien Mayhall, Tally Due, MD .  tamsulosin (FLOMAX) capsule 0.4 mg, 0.4 mg, Oral, Daily, Henreitta Leber, MD, 0.4 mg at 10/28/17 2263  Family History  Problem Relation Age of Onset  . Stroke Mother   . Diabetes Sister   . Diabetes Brother      Social History   Tobacco Use  . Smoking status:  Never Smoker  . Smokeless tobacco: Never Used  Substance Use Topics  . Alcohol use: No  . Drug use: No    Allergies as of 10/27/2017  . (No Known Allergies)    Review of Systems:    All systems reviewed and negative except where noted in HPI.   Physical Exam:  Vital signs in last 24 hours: Temp:  [97.7 F (36.5 C)-98.5 F (36.9 C)] 98.5 F (36.9 C) (10/26 1209) Pulse Rate:  [57-63] 57 (10/26 1209) Resp:  [16-20] 17 (10/26 1209) BP: (101-183)/(55-76) 103/65 (10/26 1209) SpO2:  [97 %-100 %] 99 % (10/26 1209) Last BM Date: 10/27/17 General:   Pleasant, cooperative in NAD Head:  Normocephalic and atraumatic. Eyes:   No icterus.   Conjunctiva pink. PERRLA. Ears:  Normal auditory acuity. Neck:  Supple; no masses or thyroidomegaly Lungs: Respirations even and unlabored. Lungs clear to auscultation bilaterally.   No wheezes, crackles, or rhonchi.  Heart:  Regular rate and rhythm;  Without murmur, clicks, rubs or gallops Abdomen:  Soft, mildly distended, mild right lower quadrant tenderness. Normal bowel sounds. No appreciable masses or hepatomegaly.  No rebound or guarding.  Rectal:  Not performed. Msk:  Symmetrical without gross deformities.  Strength normal Extremities:  Without edema, cyanosis or clubbing. Neurologic:  Alert and oriented x3;  grossly normal neurologically. Skin:  Intact without significant lesions or rashes. Cervical Nodes:  No significant cervical adenopathy. Psych:  Alert and cooperative. Normal affect.  LAB RESULTS: CBC Latest Ref Rng & Units 10/28/2017 10/27/2017 06/17/2016  WBC 4.0 - 10.5 K/uL 7.4 6.8 10.3  Hemoglobin 13.0 - 17.0 g/dL 11.3(L) 12.7(L) 12.5(L)  Hematocrit 39.0 - 52.0 % 35.9(L) 39.6 36.5(L)  Platelets 150 - 400 K/uL 231 298 196    BMET BMP Latest Ref Rng & Units 10/28/2017 10/27/2017 06/18/2016  Glucose 70 - 99 mg/dL 196(H) 349(H) 185(H)  BUN 8 - 23 mg/dL 14 18 24(H)  Creatinine 0.61 - 1.24 mg/dL 0.95 1.02 0.90  Sodium 135 - 145  mmol/L 140 136 139  Potassium 3.5 - 5.1 mmol/L 3.9 4.1 3.0(L)  Chloride 98 - 111 mmol/L 108 103 100(L)  CO2 22 - 32 mmol/L 26 26 33(H)  Calcium 8.9 - 10.3 mg/dL 8.6(L) 9.3 7.7(L)    LFT Hepatic Function Latest Ref Rng & Units 10/27/2017 06/15/2016 07/29/2015  Total Protein 6.5 - 8.1 g/dL 7.0 7.5 5.5(L)  Albumin 3.5 - 5.0 g/dL 3.5 3.6 3.0(L)  AST 15 - 41 U/L 11(L) 94(H) 17  ALT 0 - 44 U/L 8 32 15(L)  Alk Phosphatase 38 - 126 U/L 83 105 55  Total Bilirubin 0.3 - 1.2 mg/dL 0.5 1.7(H) 0.5  Bilirubin, Direct 0.1 - 0.5 mg/dL - - -     STUDIES: No results found.    Impression / Plan:   [name] is a 77 y.o. Caucasian male with diabetes on insulin, coronary artery disease on DAPT with 4 to 5 months history of lower abdominal pain associated with lack of appetite, weight loss, nausea, change in stool caliber, admitted with ileocolonic intussusception with no evidence of bowel obstruction Plavix has been held during this admission  Ileocolonic intussusception: Surgery is on board Discussed with patient about potential role of colonoscopy in ruling out colonic malignancy Advised him to start bowel prep slowly, see how he tolerates.  He will let the nurse know if the abdominal pain is getting worse associated with nausea or vomiting, abdominal distention Will tentatively plan for colonoscopy tomorrow depending on how he tolerates the bowel prep Clear liquid diet today N.p.o. past midnight Continue to hold Plavix  I have discussed alternative options, risks & benefits,  which include, but are not limited to, bleeding, infection, perforation,respiratory complication & drug reaction.  The patient agrees with this plan & written consent will be obtained.     Thank you for involving me in the care of this patient.      LOS: 1 day   Sherri Sear, MD  10/28/2017, 2:42 PM   Note: This dictation was prepared with Dragon dictation along with smaller phrase technology. Any  transcriptional errors that result from this process are unintentional.

## 2017-10-28 NOTE — Progress Notes (Signed)
[facility] [place] NAME: [name]    MR#:  [id number]  DATE OF BIRTH:  [birth year]  SUBJECTIVE:   patient here with abdominal pain  REVIEW OF SYSTEMS:    Review of Systems  Constitutional: Negative for fever, chills weight loss HENT: Negative for ear pain, nosebleeds, congestion, facial swelling, rhinorrhea, neck pain, neck stiffness and ear discharge.   Respiratory: Negative for cough, shortness of breath, wheezing  Cardiovascular: Negative for chest pain, palpitations and leg swelling.  Gastrointestinal: Negative for heartburn, ++abdominal pain, NOvomiting, diarrhea or consitpation Genitourinary: Negative for dysuria, urgency, frequency, hematuria Musculoskeletal: Negative for back pain or joint pain Neurological: Negative for dizziness, seizures, syncope, focal weakness,  numbness and headaches.  Hematological: Does not bruise/bleed easily.  Psychiatric/Behavioral: Negative for hallucinations, confusion, dysphoric mood    Tolerating Diet: yes      DRUG ALLERGIES:  No Known Allergies  VITALS:  Blood pressure (!) 121/57, pulse 61, temperature 97.9 F (36.6 C), resp. rate 20, height 5\' 9"  (1.753 m), weight 65.3 kg, SpO2 98 %.  PHYSICAL EXAMINATION:  Constitutional: Appears well-developed and well-nourished. No distress. HENT: Normocephalic. Marland Kitchen Oropharynx is clear and moist.  Eyes: Conjunctivae and EOM are normal. PERRLA, no scleral icterus.  Neck: Normal ROM. Neck supple. No JVD. No tracheal deviation. CVS: RRR, S1/S2 +, no murmurs, no gallops, no carotid bruit.  Pulmonary: Effort and breath sounds normal, no stridor, rhonchi, wheezes, rales.  Abdominal: Hypoactive bowel sounds with mild tenderness in right lower quadrant no distention no guarding or rebound Musculoskeletal: Normal range of motion. No edema and no tenderness.  Neuro: Alert. CN 2-12 grossly intact. No focal deficits. Skin: Skin is warm and dry. No rash  noted. Psychiatric: Normal mood and affect.      LABORATORY PANEL:   CBC Recent Labs  Lab 10/28/17 0730  WBC 7.4  HGB 11.3*  HCT 35.9*  PLT 231   ------------------------------------------------------------------------------------------------------------------  Chemistries  Recent Labs  Lab 10/27/17 1415 10/28/17 0730  NA 136 140  K 4.1 3.9  CL 103 108  CO2 26 26  GLUCOSE 349* 196*  BUN 18 14  CREATININE 1.02 0.95  CALCIUM 9.3 8.6*  AST 11*  --   ALT 8  --   ALKPHOS 83  --   BILITOT 0.5  --    ------------------------------------------------------------------------------------------------------------------  Cardiac Enzymes Recent Labs  Lab 10/27/17 1415  TROPONINI <0.03   ------------------------------------------------------------------------------------------------------------------  RADIOLOGY:  No results found.   ASSESSMENT AND PLAN:    77 year old male with history of CAD and diabetes who presents to the emergency room due to abdominal pain  1.  Ileocolic intussusception causing abdominal pain: Patient evaluated by surgery.  There is no evidence of bowel obstruction or stranding to suggest ischemia. No emergent surgery needed at this point Patient will need GI consultation for colonoscopy to evaluate if there is an oncological etiology to the intussusception. Case discussed with GI and surgery this morning. Continue IV fluids and PRN pain control  2.  Diabetes: Continue sliding scale and follow blood sugars  3.  BPH: Continue Flomax  4.  Essential hypertension: Continue Coreg   Management plans discussed with the patient and he is in agreement.  CODE STATUS: Full  TOTAL TIME TAKING CARE OF THIS PATIENT: 30 minutes.     POSSIBLE D/C 2 to 3 days, DEPENDING ON CLINICAL CONDITION.   [name] M.D on 10/28/2017 at 11:13 AM  Between 7am to 6pm - Pager -  [phone] After 6pm go to [email or web address] - password [email or web address] [facility]  Office  [phone]  CC: Primary care physician; Valera Castle, MD  Note: This dictation was prepared with Dragon dictation along with smaller phrase technology. Any transcriptional errors that result from this process are unintentional.

## 2017-10-29 ENCOUNTER — Encounter: Payer: Self-pay | Admitting: Anesthesiology

## 2017-10-29 ENCOUNTER — Inpatient Hospital Stay: Payer: Medicare HMO | Admitting: Anesthesiology

## 2017-10-29 ENCOUNTER — Encounter
Admission: EM | Disposition: A | Discharge status: Home Health | Payer: Self-pay | Source: Home / Self Care | Attending: Internal Medicine

## 2017-10-29 DIAGNOSIS — R1030 Lower abdominal pain, unspecified: Secondary | ICD-10-CM

## 2017-10-29 DIAGNOSIS — E43 Unspecified severe protein-calorie malnutrition: Secondary | ICD-10-CM

## 2017-10-29 DIAGNOSIS — D125 Benign neoplasm of sigmoid colon: Secondary | ICD-10-CM

## 2017-10-29 DIAGNOSIS — D490 Neoplasm of unspecified behavior of digestive system: Secondary | ICD-10-CM

## 2017-10-29 DIAGNOSIS — D122 Benign neoplasm of ascending colon: Secondary | ICD-10-CM

## 2017-10-29 DIAGNOSIS — C182 Malignant neoplasm of ascending colon: Secondary | ICD-10-CM

## 2017-10-29 DIAGNOSIS — K5669 Other partial intestinal obstruction: Secondary | ICD-10-CM

## 2017-10-29 DIAGNOSIS — Z01810 Encounter for preprocedural cardiovascular examination: Secondary | ICD-10-CM

## 2017-10-29 LAB — URINALYSIS, COMPLETE (UACMP) WITH MICROSCOPIC
Bilirubin Urine: NEGATIVE
GLUCOSE, UA: 50 mg/dL — AB
HGB URINE DIPSTICK: NEGATIVE
KETONES UR: NEGATIVE mg/dL
Leukocytes, UA: NEGATIVE
NITRITE: NEGATIVE
PH: 5 (ref 5.0–8.0)
Protein, ur: NEGATIVE mg/dL
Specific Gravity, Urine: 1.003 — ABNORMAL LOW (ref 1.005–1.030)

## 2017-10-29 LAB — GLUCOSE, CAPILLARY
GLUCOSE-CAPILLARY: 118 mg/dL — AB (ref 70–99)
GLUCOSE-CAPILLARY: 101 mg/dL — AB (ref 70–99)
Glucose-Capillary: 111 mg/dL — ABNORMAL HIGH (ref 70–99)
Glucose-Capillary: 264 mg/dL — ABNORMAL HIGH (ref 70–99)

## 2017-10-29 SURGERY — COLONOSCOPY
Anesthesia: General

## 2017-10-29 MED ORDER — SODIUM CHLORIDE 0.9 % IV SOLN
INTRAVENOUS | Status: DC
  Administered 2017-10-29: 09:00:00 via INTRAVENOUS

## 2017-10-29 MED ORDER — CYANOCOBALAMIN 1000 MCG/ML IJ SOLN
1000.0000 ug | Freq: Once | INTRAMUSCULAR | Status: AC
  Administered 2017-10-29: 1000 ug via INTRAMUSCULAR
  Filled 2017-10-29: qty 1

## 2017-10-29 MED ORDER — ENSURE ENLIVE PO LIQD
237.0000 mL | Freq: Three times a day (TID) | ORAL | Status: DC
  Administered 2017-10-30 – 2017-10-31 (×3): 237 mL via ORAL

## 2017-10-29 MED ORDER — SPOT INK MARKER SYRINGE KIT
PACK | SUBMUCOSAL | Status: DC | PRN
  Administered 2017-10-29: 4 mL via SUBMUCOSAL

## 2017-10-29 MED ORDER — PROPOFOL 500 MG/50ML IV EMUL
INTRAVENOUS | Status: AC
  Filled 2017-10-29: qty 50

## 2017-10-29 MED ORDER — PROPOFOL 10 MG/ML IV BOLUS
INTRAVENOUS | Status: DC | PRN
  Administered 2017-10-29: 60 mg via INTRAVENOUS

## 2017-10-29 MED ORDER — BOOST / RESOURCE BREEZE PO LIQD CUSTOM: 1.0000 | Freq: Three times a day (TID) | ORAL | Status: DC

## 2017-10-29 MED ORDER — VITAMIN B-12 1000 MCG PO TABS
1000.0000 ug | ORAL_TABLET | Freq: Every day | ORAL | Status: DC
  Administered 2017-10-30 – 2017-11-06 (×8): 1000 ug via ORAL
  Filled 2017-10-29 (×8): qty 1

## 2017-10-29 MED ORDER — SODIUM CHLORIDE 0.9 % IJ SOLN
INTRAMUSCULAR | Status: DC | PRN
  Administered 2017-10-29: 5 mL via INTRAVENOUS

## 2017-10-29 MED ORDER — PROPOFOL 500 MG/50ML IV EMUL
INTRAVENOUS | Status: DC | PRN
  Administered 2017-10-29: 150 ug/kg/min via INTRAVENOUS

## 2017-10-29 MED ORDER — LIDOCAINE 2% (20 MG/ML) 5 ML SYRINGE
INTRAMUSCULAR | Status: DC | PRN
  Administered 2017-10-29: 50 mg via INTRAVENOUS

## 2017-10-29 NOTE — Progress Notes (Signed)
Tap water enema given as ordered. Pt tolerating well.

## 2017-10-29 NOTE — Consult Note (Signed)
[name] is a 77 y.o. male  [id number]  Primary Cardiologist: [facility] Reason for Consultation: Preoperative clearance for surgery  HPI: This is a 77 year old white male with history of coronary artery disease two-vessel status post PCI in 2016 for mid RCA and mid LAD presented to the hospital with abdominal pain.  Patient has probably a mass between cecum and ascending colon according to the colonoscopy done today.  I was asked to evaluate the patient because of preoperative clearance.  Patient denies any chest pain or shortness of breath   Review of Systems: No orthopnea PND or leg swelling   Past Medical History:  Diagnosis Date  . BPH (benign prostatic hyperplasia)   . CAD (coronary artery disease)    a. 10/2014 Ant STEMI/PCI: LM nl, LAD 120m (2.75x38 Xience DES) - R->L collats, D1 60, D2 90ost, LCX 20ost, OM1/2/3 min irregs, RCA 20m (2.75x15 Xience DES), 50d, RPDA small;  b. 10/2014 Echo: EF 55-60%, mod ant/ap HK, Gr1 DD.  . Diabetes mellitus without complication ([facility])   . Essential hypertension   . Hyperlipidemia   . Osteoarthritis     Medications Prior to Admission  Medication Sig Dispense Refill  . aspirin 81 MG tablet Take 81 mg by mouth daily.    . carvedilol (COREG) 3.125 MG tablet Take 1 tablet (3.125 mg total) by mouth 2 (two) times daily with a meal. 60 tablet 5  . Dulaglutide (TRULICITY) 1.5 NK/5.3ZJ SOPN Inject 1.5 mg into the skin every Wednesday.     Marland Kitchen glucagon (GLUCAGON EMERGENCY) 1 MG injection Inject 1 mg into the vein once as needed.    . insulin aspart (NOVOLOG) 100 UNIT/ML injection Inject 20 Units into the skin 3 (three) times daily with meals.     . insulin glargine (LANTUS) 100 UNIT/ML injection Inject 38 Units into the skin at bedtime.     . metFORMIN (GLUCOPHAGE) 500 MG tablet Take 1,000 mg by mouth 2 (two) times daily with a meal.    . nitroGLYCERIN (NITROSTAT) 0.4 MG SL tablet Place 1 tablet (0.4 mg total) under the tongue every 5 (five)  minutes as needed for chest pain. 30 tablet 2  . tamsulosin (FLOMAX) 0.4 MG CAPS capsule Take 0.4 mg by mouth daily.    . clopidogrel (PLAVIX) 75 MG tablet Take 1 tablet (75 mg total) by mouth daily. (Patient not taking: Reported on 10/27/2017) 90 tablet 2     . aspirin EC  81 mg Oral Daily  . carvedilol  3.125 mg Oral BID WC  . cyanocobalamin  1,000 mcg Intramuscular Once  . enoxaparin (LOVENOX) injection  40 mg Subcutaneous Q24H  . feeding supplement  1 Container Oral TID BM  . insulin aspart  0-5 Units Subcutaneous QHS  . insulin aspart  0-9 Units Subcutaneous TID WC  . tamsulosin  0.4 mg Oral Daily  . [START ON 10/30/2017] vitamin B-12  1,000 mcg Oral Daily    Infusions: . sodium chloride Stopped (10/29/17 0914)    No Known Allergies  Social History   Socioeconomic History  . Marital status: Widowed    Spouse name: Not on file  . Number of children: Not on file  . Years of education: Not on file  . Highest education level: Not on file  Occupational History  . Not on file  Social Needs  . Financial resource strain: Not on file  . Food insecurity:    Worry: Not on file    Inability: Not  on file  . Transportation needs:    Medical: Not on file    Non-medical: Not on file  Tobacco Use  . Smoking status: Never Smoker  . Smokeless tobacco: Never Used  Substance and Sexual Activity  . Alcohol use: No  . Drug use: No  . Sexual activity: Not on file  Lifestyle  . Physical activity:    Days per week: Not on file    Minutes per session: Not on file  . Stress: Not on file  Relationships  . Social connections:    Talks on phone: Not on file    Gets together: Not on file    Attends religious service: Not on file    Active member of club or organization: Not on file    Attends meetings of clubs or organizations: Not on file    Relationship status: Not on file  . Intimate partner violence:    Fear of current or ex partner: Not on file    Emotionally abused: Not on  file    Physically abused: Not on file    Forced sexual activity: Not on file  Other Topics Concern  . Not on file  Social History Narrative  . Not on file    Family History  Problem Relation Age of Onset  . Stroke Mother   . Diabetes Sister   . Diabetes Brother     PHYSICAL EXAM: Vitals:   10/29/17 1003 10/29/17 1046  BP: (!) 94/43 128/80  Pulse:  (!) 57  Resp:  17  Temp: (!) 97 F (36.1 C) (!) 97.5 F (36.4 C)  SpO2:  99%     Intake/Output Summary (Last 24 hours) at 10/29/2017 1213 Last data filed at 10/29/2017 0600 Gross per 24 hour  Intake 1168.38 ml  Output 300 ml  Net 868.38 ml    General:  Well appearing. No respiratory difficulty HEENT: normal Neck: supple. no JVD. Carotids 2+ bilat; no bruits. No lymphadenopathy or thryomegaly appreciated. Cor: PMI nondisplaced. Regular rate & rhythm. No rubs, gallops or murmurs. Lungs: clear Abdomen: soft, nontender, nondistended. No hepatosplenomegaly. No bruits or masses. Good bowel sounds. Extremities: no cyanosis, clubbing, rash, edema Neuro: alert & oriented x 3, cranial nerves grossly intact. moves all 4 extremities w/o difficulty. Affect pleasant.  ECG: Prior EKG in 2018 had normal sinus rhythm no acute changes but no current EKG done which will be ordered  Results for orders placed or performed during the hospital encounter of 10/27/17 (from the past 24 hour(s))  Ferritin     Status: None   Collection Time: 10/28/17  2:18 PM  Result Value Ref Range   Ferritin 26 24 - 336 ng/mL  Iron and TIBC     Status: None   Collection Time: 10/28/17  2:18 PM  Result Value Ref Range   Iron 52 45 - 182 ug/dL   TIBC 287 250 - 450 ug/dL   Saturation Ratios 18 17.9 - 39.5 %   UIBC 235 ug/dL  Vitamin B12     Status: None   Collection Time: 10/28/17  2:18 PM  Result Value Ref Range   Vitamin B-12 194 180 - 914 pg/mL  Folate     Status: None   Collection Time: 10/28/17  2:18 PM  Result Value Ref Range   Folate 11.4 >5.9  ng/mL  Glucose, capillary     Status: Abnormal   Collection Time: 10/28/17  4:32 PM  Result Value Ref Range   Glucose-Capillary 158 (H) 70 -  99 mg/dL  Glucose, capillary     Status: Abnormal   Collection Time: 10/28/17  9:29 PM  Result Value Ref Range   Glucose-Capillary 110 (H) 70 - 99 mg/dL   Comment 1 Notify RN   Glucose, capillary     Status: Abnormal   Collection Time: 10/29/17  7:36 AM  Result Value Ref Range   Glucose-Capillary 118 (H) 70 - 99 mg/dL  Glucose, capillary     Status: Abnormal   Collection Time: 10/29/17 11:55 AM  Result Value Ref Range   Glucose-Capillary 111 (H) 70 - 99 mg/dL   No results found.   ASSESSMENT AND PLAN: History of coronary artery disease and status post STEMI in 2016 and PCI with drug-eluting stent of mid RCA and mid LAD.  History of moderate LV dysfunction.  Advise getting EKG and echocardiogram and cardiac enzymes.  Patient is going to be seen by oncology as well as colorectal surgeon.  Patient would be moderate risks for general anesthesia.  Advised continuation of aspirin and will make further recommendation after looking at EKG and echocardiogram.  Leanny Moeckel A

## 2017-10-29 NOTE — Anesthesia Preprocedure Evaluation (Addendum)
Anesthesia Evaluation  Patient identified by MRN, date of birth, ID band Patient awake    Reviewed: Allergy & Precautions, H&P , NPO status , Patient's Chart, lab work & pertinent test results, reviewed documented beta blocker date and time   History of Anesthesia Complications Negative for: history of anesthetic complications  Airway Mallampati: I  TM Distance: >3 FB Neck ROM: full    Dental  (+) Dental Advidsory Given, Upper Dentures, Lower Dentures   Pulmonary neg pulmonary ROS,           Cardiovascular Exercise Tolerance: Good hypertension, (-) angina+ CAD, + Past MI and + Cardiac Stents  (-) CABG (-) dysrhythmias (-) Valvular Problems/Murmurs     Neuro/Psych negative neurological ROS  negative psych ROS   GI/Hepatic Neg liver ROS, GERD  ,  Endo/Other  negative endocrine ROSdiabetes  Renal/GU negative Renal ROS  negative genitourinary   Musculoskeletal   Abdominal   Peds  Hematology negative hematology ROS (+)   Anesthesia Other Findings Past Medical History: No date: BPH (benign prostatic hyperplasia) No date: CAD (coronary artery disease)     Comment:  a. 10/2014 Ant STEMI/PCI: LM nl, LAD 170m (2.75x38               Xience DES) - R->L collats, D1 60, D2 90ost, LCX 20ost,               OM1/2/3 min irregs, RCA 60m (2.75x15 Xience DES), 50d,               RPDA small;  b. 10/2014 Echo: EF 55-60%, mod ant/ap HK,               Gr1 DD. No date: Diabetes mellitus without complication ([facility]) No date: Essential hypertension No date: Hyperlipidemia No date: Osteoarthritis   Reproductive/Obstetrics negative OB ROS                            Anesthesia Physical Anesthesia Plan  ASA: III  Anesthesia Plan: General   Post-op Pain Management:    Induction: Intravenous  PONV Risk Score and Plan: 2 and Propofol infusion and TIVA  Airway Management Planned: Natural Airway and Nasal  Cannula  Additional Equipment:   Intra-op Plan:   Post-operative Plan:   Informed Consent: I have reviewed the patients History and Physical, chart, labs and discussed the procedure including the risks, benefits and alternatives for the proposed anesthesia with the patient or authorized representative who has indicated his/her understanding and acceptance.   Dental Advisory Given  Plan Discussed with: Anesthesiologist, CRNA and Surgeon  Anesthesia Plan Comments:         Anesthesia Quick Evaluation

## 2017-10-29 NOTE — Progress Notes (Signed)
Initial Nutrition Assessment  DOCUMENTATION CODES:   Severe malnutrition in context of chronic illness  INTERVENTION:  Provide Ensure Enlive po TID, each supplement provides 350 kcal and 20 grams of protein.  Encouraged adequate intake of calories and protein at meals.   NUTRITION DIAGNOSIS:   Severe Malnutrition related to chronic illness(5 month history of anorexia, early satiety, abdominal pain, N/V, two lesions in colon concerning for malignancy) as evidenced by severe fat depletion, moderate muscle depletion, severe muscle depletion.  GOAL:   Patient will meet greater than or equal to 90% of their needs  MONITOR:   PO intake, Supplement acceptance, Labs, Weight trends, I & O's  REASON FOR ASSESSMENT:   Malnutrition Screening Tool    ASSESSMENT:   77 year old male with PMHx of DM, CAD, hx STEMI s/p PCI 10/2014, HTN, HLD, BPH, OA who is admitted with ileocolic intussusception.   -Patient underwent colonoscopy today. Findings included apple core lesion, near complete obstruction in proximal ascending colon/cecum and synchronous, nonobstructing ulcerated large lesion in rectosigmoid area. Pending pathology. Per note okay for full liquid diet with protein supplements.  Met with patient at bedside. He reports he had a poor appetite for approximately 5 months PTA. It was related to abdominal pain, N/V, anorexia, early satiety. He would attempt to eat 1-2 meals per day (usually potatoes, corn, and beans) but could only eat a couple bites. His appetite is feeling a little better today and he was able to finish 100% of her full liquid tray for lunch today. He is amenable to drinking ONS to help meet calorie/protein needs.  Patient reports his UBW was 183 lbs (83.2 kg). He reports he has been losing weight for a while now but has had noticeable weight loss the past month. No weight in chart from the past year. RD obtained bed scale weight of 64.2 kg (141.54 lbs).  Medications reviewed  and include: vitamin B12 1000 mcg IM once today, Novolog 0-9 units TID, Novolog 0-5 units QHS, Flomax, vitamin B12 1000 mcg daily starting tomorrow, NS @ 50 mL/hr.  Labs reviewed: CBG 110-158 past 24 hrs. On 10/26 Iron 52, TIBC 287, Ferritin 26, Folate 11.4, Vitamin B12 194.  NUTRITION - FOCUSED PHYSICAL EXAM:    Most Recent Value  Orbital Region  Severe depletion  Upper Arm Region  Severe depletion  Thoracic and Lumbar Region  Moderate depletion  Buccal Region  Severe depletion  Temple Region  Severe depletion  Clavicle Bone Region  Moderate depletion  Clavicle and Acromion Bone Region  Moderate depletion  Scapular Bone Region  Moderate depletion  Dorsal Hand  Severe depletion  Patellar Region  Severe depletion  Anterior Thigh Region  Severe depletion  Posterior Calf Region  Severe depletion  Edema (RD Assessment)  None  Hair  Reviewed  Eyes  Reviewed  Mouth  Reviewed  Skin  Reviewed  Nails  Reviewed     Diet Order:   Diet Order            Diet full liquid Room service appropriate? Yes; Fluid consistency: Thin  Diet effective now              EDUCATION NEEDS:   Education needs have been addressed  Skin:  Skin Assessment: Reviewed RN Assessment  Last BM:  10/29/2017 - medium type 7  Height:   Ht Readings from Last 1 Encounters:  10/27/17 5' 9"  (1.753 m)    Weight:   Wt Readings from Last 1 Encounters:  10/29/17  64.2 kg    Ideal Body Weight:  72.7 kg  BMI:  Body mass index is 20.9 kg/m.  Estimated Nutritional Needs:   Kcal:  1785-2060 (MSJ x 1.3-1.5)  Protein:  90-105 grams (1.4-1.6 grams/kg)  Fluid:  1.8-2 L/day (1 mL/kcal)  Willey Blade, MS, RD, LDN Office: [phone] Pager: [phone] After Hours/Weekend Pager: [phone]

## 2017-10-29 NOTE — Progress Notes (Signed)
[place] [facility] [place] NAME: [name]    MR#:  [id number]  DATE OF BIRTH:  [birth year]  SUBJECTIVE:  Drowsy after procedure  REVIEW OF SYSTEMS:    unAble to obtain patient is drowsy after colonoscopy  Tolerating Diet: yes      DRUG ALLERGIES:  No Known Allergies  VITALS:  Blood pressure (!) 94/43, pulse 63, temperature (!) 97 F (36.1 C), temperature source Tympanic, resp. rate 18, height 5\' 9"  (1.753 m), weight 65.3 kg, SpO2 99 %.  PHYSICAL EXAMINATION:  Constitutional: Appears well-developed and well-nourished. No distress. HENT: Normocephalic. Marland Kitchen Oropharynx is clear and moist.  Eyes: Conjunctivae and EOM are normal. PERRLA, no scleral icterus.  Neck: Normal ROM. Neck supple. No JVD. No tracheal deviation. CVS: RRR, S1/S2 +, no murmurs, no gallops, no carotid bruit.  Pulmonary: Effort and breath sounds normal, no stridor, rhonchi, wheezes, rales.  Abdominal: no pain, tenderness or rebound  musculoskeletal: Normal range of motion. No edema and no tenderness.  Neuro: drowsy Skin: Skin is warm and dry. No rash noted. Psychiatric: drowsy    LABORATORY PANEL:   CBC Recent Labs  Lab 10/28/17 0730  WBC 7.4  HGB 11.3*  HCT 35.9*  PLT 231   ------------------------------------------------------------------------------------------------------------------  Chemistries  Recent Labs  Lab 10/27/17 1415 10/28/17 0730  NA 136 140  K 4.1 3.9  CL 103 108  CO2 26 26  GLUCOSE 349* 196*  BUN 18 14  CREATININE 1.02 0.95  CALCIUM 9.3 8.6*  AST 11*  --   ALT 8  --   ALKPHOS 83  --   BILITOT 0.5  --    ------------------------------------------------------------------------------------------------------------------  Cardiac Enzymes Recent Labs  Lab 10/27/17 1415  TROPONINI <0.03   ------------------------------------------------------------------------------------------------------------------  RADIOLOGY:  No  results found.   ASSESSMENT AND PLAN:    77 year old male with history of CAD and diabetes who presents to the emergency room due to abdominal pain  1.  Ileocolic intussusception causing abdominal pain:  Patient is status post colonoscopy this morning with apple core lesion and near complete obstruction in proximal ascending/cecum Biopsies were taken Plan for oncology evaluation Appreciate GI and surgical management   2.  Diabetes: Continue sliding scale and follow blood sugars  3.  BPH: Continue Flomax  4.  Essential hypertension: Continue Coreg  5.  CAD: Continue to hold Plavix Continue aspirin, Coreg [facility] cardiology consult for cardiac clearance of patient with undergo surgery   6.Iron and B12 deficiency: Oncology evaluation for further management Start IM/PO  B12 supplement IRON deficiency from colon mass   Management plans discussed with the patient and he is in agreement.  CODE STATUS: Full  TOTAL TIME TAKING CARE OF THIS PATIENT: 21 minutes.     POSSIBLE D/C ??  DEPENDING ON CLINICAL CONDITION.   Crespin Forstrom M.D on 10/29/2017 at 10:42 AM  Between 7am to 6pm - Pager - [phone] After 6pm go to [email or web address] - password [email or web address] [facility]  Office  [phone]  CC: Primary care physician; Valera Castle, MD  Note: This dictation was prepared with Dragon dictation along with smaller phrase technology. Any transcriptional errors that result from this process are unintentional.

## 2017-10-29 NOTE — Transfer of Care (Signed)
Immediate Anesthesia Transfer of Care Note  Patient: [name]  Procedure(s) Performed: COLONOSCOPY (N/A )  Patient Location: Endoscopy Unit  Anesthesia Type:General  Level of Consciousness: sedated  Airway & Oxygen Therapy: Patient connected to nasal cannula oxygen  Post-op Assessment: Post -op Vital signs reviewed and stable  Post vital signs: stable  Last Vitals:  Vitals Value Taken Time  BP 94/43 10/29/2017 10:03 AM  Temp 36.1 C 10/29/2017 10:03 AM  Pulse 63 10/29/2017 10:02 AM  Resp 13 10/29/2017 10:02 AM  SpO2 98 % 10/29/2017 10:02 AM  Vitals shown include unvalidated device data.  Last Pain:  Vitals:   10/29/17 1003  TempSrc: Tympanic  PainSc:          Complications: No apparent anesthesia complications

## 2017-10-29 NOTE — Op Note (Signed)
[facility] Gastroenterology Patient Name: [name] Procedure Date: 10/29/2017 9:14 AM MRN: [id number] Account #: [id number] Date of Birth: 09/08/1940 Admit Type: Inpatient Age: 77 Room: [facility] ENDO ROOM 4 Gender: Male Note Status: Finalized Procedure:            Colonoscopy Indications:          This is the patient's first colonoscopy, , Lower                        abdominal pain, Abnormal CT of the GI tract,                        ileocolonic intussusception Providers:            Lin Landsman MD, MD Referring MD:         Valera Castle (Referring MD) Medicines:            Monitored Anesthesia Care Complications:        No immediate complications. Estimated blood loss:                        Minimal. Procedure:            Pre-Anesthesia Assessment:                       - Prior to the procedure, a History and Physical was                        performed, and patient medications and allergies were                        reviewed. The patient is competent. The risks and                        benefits of the procedure and the sedation options and                        risks were discussed with the patient. All questions                        were answered and informed consent was obtained.                        Patient identification and proposed procedure were                        verified by the physician, the nurse, the                        anesthesiologist, the anesthetist and the technician in                        the pre-procedure area in the procedure room in the                        endoscopy suite. Mental Status Examination: alert and                        oriented. Airway Examination: normal oropharyngeal  airway and neck mobility. Respiratory Examination:                        clear to auscultation. CV Examination: normal.                        Prophylactic Antibiotics: The patient does not require                         prophylactic antibiotics. Prior Anticoagulants: The                        patient has taken Plavix (clopidogrel), last dose was 2                        days prior to procedure. ASA Grade Assessment: III - A                        patient with severe systemic disease. After reviewing                        the risks and benefits, the patient was deemed in                        satisfactory condition to undergo the procedure. The                        anesthesia plan was to use monitored anesthesia care                        (MAC). Immediately prior to administration of                        medications, the patient was re-assessed for adequacy                        to receive sedatives. The heart rate, respiratory rate,                        oxygen saturations, blood pressure, adequacy of                        pulmonary ventilation, and response to care were                        monitored throughout the procedure. The physical status                        of the patient was re-assessed after the procedure.                       After obtaining informed consent, the colonoscope was                        passed under direct vision. Throughout the procedure,                        the patient's blood pressure, pulse, and oxygen  saturations were monitored continuously. The                        Colonoscope was introduced through the anus and                        advanced to the the ascending colon to examine a mass.                        This was the intended extent. The colonoscopy was                        performed without difficulty. The patient tolerated the                        procedure well. The quality of the bowel preparation                        was evaluated using the BBPS [facility] Bowel Preparation                        Scale) with scores of: Right Colon = 3, Transverse                        Colon = 3 and Left  Colon = 3 (entire mucosa seen well                        with no residual staining, small fragments of stool or                        opaque liquid). The total BBPS score equals 9. Findings:      The perianal and digital rectal examinations were normal. Pertinent       negatives include normal sphincter tone and no palpable rectal lesions.      A frond-like/villous and fungating partially obstructing large mass was       found in the proximal ascending colon. The mass was circumferential.       Oozing was present. This was biopsied with a cold forceps for histology.       Area distal to the mass was tattooed with an injection of Spot (carbon       black).      A 7 mm polyp was found in the proximal ascending colon, 2 folds distal       to the mass. The polyp was sessile. The polyp was removed with a hot       snare. Resection and retrieval were complete. To prevent bleeding after       the polypectomy, one hemostatic clip was successfully placed (MR       conditional). There was no bleeding during, or at the end, of the       procedure.      A 10 mm polyp was found in the proximal sigmoid colon. The polyp was       carpet-like and flat. Preparations were made for mucosal resection.       Saline was injected to raise the lesion. Snare mucosal resection was       performed. Resection and retrieval were complete. To prevent bleeding       after mucosal resection, two hemostatic clips  were successfully placed       (MR conditional). There was no bleeding during, or at the end, of the       procedure.      An infiltrative and ulcerated non-obstructing medium-sized mass was       found in the recto-sigmoid colon and at 27 cm proximal to the anus. The       mass was partially circumferential (involving one-third of the lumen       circumference). The mass measured two cm in length. Oozing was present.       This was biopsied with a cold forceps for histology. Area distal to the       lesion  was tattooed with an injection of Spot (carbon black).      The retroflexed view of the distal rectum and anal verge was normal and       showed no anal or rectal abnormalities. Impression:           - Malignant partially obstructing tumor in the proximal                        ascending colon. Biopsied. Tattooed.                       - One 7 mm polyp in the proximal ascending colon,                        removed with a hot snare. Resected and retrieved. Clip                        (MR conditional) was placed.                       - One 10 mm polyp in the proximal sigmoid colon,                        removed with mucosal resection. Resected and retrieved.                        Clips (MR conditional) were placed.                       - Likely malignant tumor in the recto-sigmoid colon and                        at 27 cm proximal to the anus. Biopsied. Tattooed.                       - The distal rectum and anal verge are normal on                        retroflexion view.                       - Mucosal resection was performed. Resection and                        retrieval were complete. Recommendation:       - Return patient to hospital ward for ongoing care.                       -  Clear liquid diet only.                       - Continue present medications.                       - Await pathology results.                       - F/u with general surgery                       - Consult oncology. Procedure Code(s):    --- Professional ---                       [phone], [phone], Colonoscopy, flexible; with endoscopic                        mucosal resection                       45385, 52, Colonoscopy, flexible; with removal of                        tumor(s), polyp(s), or other lesion(s) by snare                        technique                       45381, 59,52, Colonoscopy, flexible; with directed                        submucosal injection(s), any substance                        45380, 59,52, Colonoscopy, flexible; with biopsy,                        single or multiple Diagnosis Code(s):    --- Professional ---                       C18.2, Malignant neoplasm of ascending colon                       K56.690, Other partial intestinal obstruction                       D49.0, Neoplasm of unspecified behavior of digestive                        system                       D12.2, Benign neoplasm of ascending colon                       D12.5, Benign neoplasm of sigmoid colon                       R10.30, Lower abdominal pain, unspecified                       R93.3, Abnormal findings on diagnostic imaging of other  parts of digestive tract CPT copyright 2018 American Medical Association. All rights reserved. The codes documented in this report are preliminary and upon coder review may  be revised to meet current compliance requirements. Dr. Ulyess Mort Lin Landsman MD, MD 10/29/2017 10:11:08 AM This report has been signed electronically. Number of Addenda: 0 Note Initiated On: 10/29/2017 9:14 AM Scope Withdrawal Time: 0 hours 32 minutes 41 seconds  Total Procedure Duration: 0 hours 35 minutes 57 seconds       [facility]

## 2017-10-29 NOTE — Anesthesia Post-op Follow-up Note (Signed)
Anesthesia QCDR form completed.        

## 2017-10-29 NOTE — OR Nursing (Signed)
REPORT CALL TO PRIMARY RN LEA ON FLOOR. PT S/P COLONOSCOPY WITH 2 LARGE COLON MASS. MAY HAVE CLEAR LIQUIDS AND DR VANGA TO COME TO ROOM TO DISCUSS WITH PT WHEN HE IS MORE AROUSABLE

## 2017-10-29 NOTE — Progress Notes (Signed)
Colonoscopy performed  Apple core lesion, near complete obstruction in proximal ascending/cecum Distal area tattooed, biopsies performed Small polyp in rectum and descending colon, resected, status post 1 clip Flat polyp, 10 mm in sigmoid colon, EMR performed, 2 clips placed Synchronous, nonobstructing ulcerated large lesion at 27 cm/rectosigmoid area Distal area tattooed, biopsies performed  Recommendations Follow-up pathology Consult oncology and follow-up with surgery Okay with full liquid diet, protein supplements Continue to hold Plavix  Cephas Darby, MD [place]  [address], [place] [address]  Main: [phone]  Fax: [phone] Pager: [phone]

## 2017-10-29 NOTE — Anesthesia Postprocedure Evaluation (Signed)
Anesthesia Post Note  Patient: [name]  Procedure(s) Performed: COLONOSCOPY (N/A )  Patient location during evaluation: Endoscopy Anesthesia Type: General Level of consciousness: awake and alert Pain management: pain level controlled Vital Signs Assessment: post-procedure vital signs reviewed and stable Respiratory status: spontaneous breathing, nonlabored ventilation, respiratory function stable and patient connected to nasal cannula oxygen Cardiovascular status: blood pressure returned to baseline and stable Postop Assessment: no apparent nausea or vomiting Anesthetic complications: no     Last Vitals:  Vitals:   10/29/17 1712 10/29/17 2043  BP:  133/65  Pulse: 62 67  Resp:  18  Temp:  36.8 C  SpO2:  99%    Last Pain:  Vitals:   10/29/17 2043  TempSrc: Oral  PainSc:                  [name]

## 2017-10-30 ENCOUNTER — Inpatient Hospital Stay
Admit: 2017-10-30 | Discharge: 2017-10-30 | Disposition: A | Discharge status: Home / Self Care | Payer: Medicare HMO | Attending: Cardiovascular Disease | Admitting: Cardiovascular Disease

## 2017-10-30 DIAGNOSIS — E119 Type 2 diabetes mellitus without complications: Secondary | ICD-10-CM

## 2017-10-30 DIAGNOSIS — I2510 Atherosclerotic heart disease of native coronary artery without angina pectoris: Secondary | ICD-10-CM

## 2017-10-30 DIAGNOSIS — C189 Malignant neoplasm of colon, unspecified: Secondary | ICD-10-CM

## 2017-10-30 LAB — CBC
HEMATOCRIT: 35.1 % — AB (ref 39.0–52.0)
HEMOGLOBIN: 11.3 g/dL — AB (ref 13.0–17.0)
MCH: 26.2 pg (ref 26.0–34.0)
MCHC: 32.2 g/dL (ref 30.0–36.0)
MCV: 81.4 fL (ref 80.0–100.0)
PLATELETS: 231 10*3/uL (ref 150–400)
RBC: 4.31 MIL/uL (ref 4.22–5.81)
RDW: 14 % (ref 11.5–15.5)
WBC: 7.2 10*3/uL (ref 4.0–10.5)
nRBC: 0 % (ref 0.0–0.2)

## 2017-10-30 LAB — GLUCOSE, CAPILLARY
GLUCOSE-CAPILLARY: 109 mg/dL — AB (ref 70–99)
Glucose-Capillary: 111 mg/dL — ABNORMAL HIGH (ref 70–99)
Glucose-Capillary: 205 mg/dL — ABNORMAL HIGH (ref 70–99)
Glucose-Capillary: 139 mg/dL — ABNORMAL HIGH (ref 70–99)

## 2017-10-30 LAB — HEMOGLOBIN A1C
Hgb A1c MFr Bld: 9.7 % — ABNORMAL HIGH (ref 4.8–5.6)
Mean Plasma Glucose: 231.69 mg/dL

## 2017-10-30 LAB — ECHOCARDIOGRAM COMPLETE
Height: 69 in
Weight: 2264.57 oz

## 2017-10-30 NOTE — Progress Notes (Signed)
[place] [facility] [place] NAME: [name]    MR#:  [id number]  DATE OF BIRTH:  26-Jul-1940  SUBJECTIVE:  Doing well this morning.  Endorses some generalized abdominal pain.  Had 2 bowel movements in the last 24 hours.  No nausea no vomiting  REVIEW OF SYSTEMS:    Constitutional: Negative for fever, chills weight loss HENT: Negative for ear pain, nosebleeds, congestion, facial swelling, rhinorrhea, neck pain, neck stiffness and ear discharge.   Respiratory: Negative for cough, shortness of breath, wheezing  Cardiovascular: Negative for chest pain, palpitations and leg swelling.  Gastrointestinal: Negative for heartburn, no vomiting, diarrhea or constipation. +abdominal pain Genitourinary: Negative for dysuria, urgency, frequency, hematuria Musculoskeletal: Negative for back pain or joint pain Neurological: Negative for dizziness, seizures, syncope, focal weakness,  numbness and headaches.  Hematological: Does not bruise/bleed easily.  Psychiatric/Behavioral: Negative for hallucinations, confusion, dysphoric mood  Tolerating Diet: yes  DRUG ALLERGIES:  No Known Allergies  VITALS:  Blood pressure 122/60, pulse 63, temperature (!) 97.5 F (36.4 C), temperature source Oral, resp. rate 16, height 5\' 9"  (1.753 m), weight 64.2 kg, SpO2 100 %.  PHYSICAL EXAMINATION:  Constitutional: Appears well-developed and well-nourished. No distress. HENT: Normocephalic. Atraumatic. Oropharynx is clear and moist.  Eyes: Conjunctivae and EOM are normal. PERRLA, no scleral icterus.  Neck: Normal ROM. Neck supple. No JVD. No tracheal deviation. CVS: RRR, S1/S2 +, no murmurs, no gallops, no carotid bruit.  Pulmonary: Effort and breath sounds normal, no stridor, rhonchi, wheezes, rales.  Abdominal: +mild generalized tenderness to palpation, no distension, no rebound, no guarding musculoskeletal: Normal range of motion. No edema and no tenderness.  Neuro: Awake,  alert, moving all extremities Skin: Skin is warm and dry. No rash noted. Psychiatric: normal thought content, normal judgment  LABORATORY PANEL:   CBC Recent Labs  Lab 10/30/17 0533  WBC 7.2  HGB 11.3*  HCT 35.1*  PLT 231   ------------------------------------------------------------------------------------------------------------------  Chemistries  Recent Labs  Lab 10/27/17 1415 10/28/17 0730  NA 136 140  K 4.1 3.9  CL 103 108  CO2 26 26  GLUCOSE 349* 196*  BUN 18 14  CREATININE 1.02 0.95  CALCIUM 9.3 8.6*  AST 11*  --   ALT 8  --   ALKPHOS 83  --   BILITOT 0.5  --    ------------------------------------------------------------------------------------------------------------------  Cardiac Enzymes Recent Labs  Lab 10/27/17 1415  TROPONINI <0.03   ------------------------------------------------------------------------------------------------------------------  RADIOLOGY:  No results found.   ASSESSMENT AND PLAN:   77 year old male with history of CAD and diabetes who presents to the emergency room due to abdominal pain  1. Colonic mass with resulting ileocolic intussusception- s/p colonoscopy 10/27 with large partially obstructing mass in the ascending colon and medium-sized non-obstructing mass in the rectosigmoid colon. -Awaiting path results -Oncology following -General surgery following- per patient, plan for surgery on Wednesday  2.  Diabetes: Continue sliding scale and follow blood sugars  3.  BPH: Continue Flomax  4.  Essential hypertension: Continue Coreg  5.  CAD: Cleared for surgery by cardiology -Continue to hold Plavix -Continue aspirin, Coreg  6. Iron deficiency anemia and B12 deficiency: Iron deficiency likely due to colon mass -Start B12 supplement  Management plans discussed with the patient and he is in agreement.  CODE STATUS: Full  TOTAL TIME TAKING CARE OF THIS PATIENT: 35 minutes.     POSSIBLE D/C unknown,   DEPENDING ON CLINICAL CONDITION.   [name] M.D on  10/30/2017 at 4:10 PM  Between 7am to 6pm - Pager - [phone] After 6pm go to [email or web address] - password [email or web address] [facility]  Office  [phone]  CC: Primary care physician; Valera Castle, MD  Note: This dictation was prepared with Dragon dictation along with smaller phrase technology. Any transcriptional errors that result from this process are unintentional.

## 2017-10-30 NOTE — Progress Notes (Signed)
SUBJECTIVE: Patient denies any chest pain or shortness of breath   Vitals:   10/29/17 1410 10/29/17 1712 10/29/17 2043 10/30/17 0521  BP:   133/65 (!) 125/58  Pulse:  62 67 (!) 57  Resp:   18 16  Temp:   98.3 F (36.8 C) 98 F (36.7 C)  TempSrc:   Oral Oral  SpO2:   99% 97%  Weight: 64.2 kg     Height:        Intake/Output Summary (Last 24 hours) at 10/30/2017 0941 Last data filed at 10/30/2017 0657 Gross per 24 hour  Intake 1056.11 ml  Output 1100 ml  Net -43.89 ml    LABS: Basic Metabolic Panel: Recent Labs    10/27/17 1415 10/28/17 0730  NA 136 140  K 4.1 3.9  CL 103 108  CO2 26 26  GLUCOSE 349* 196*  BUN 18 14  CREATININE 1.02 0.95  CALCIUM 9.3 8.6*   Liver Function Tests: Recent Labs    10/27/17 1415  AST 11*  ALT 8  ALKPHOS 83  BILITOT 0.5  PROT 7.0  ALBUMIN 3.5   Recent Labs    10/27/17 1415  LIPASE 23   CBC: Recent Labs    10/27/17 1415 10/28/17 0730 10/30/17 0533  WBC 6.8 7.4 7.2  NEUTROABS 4.3  --   --   HGB 12.7* 11.3* 11.3*  HCT 39.6 35.9* 35.1*  MCV 81.1 82.2 81.4  PLT 298 231 231   Cardiac Enzymes: Recent Labs    10/27/17 1415  TROPONINI <0.03   BNP: Invalid input(s): POCBNP D-Dimer: No results for input(s): DDIMER in the last 72 hours. Hemoglobin A1C: No results for input(s): HGBA1C in the last 72 hours. Fasting Lipid Panel: No results for input(s): CHOL, HDL, LDLCALC, TRIG, CHOLHDL, LDLDIRECT in the last 72 hours. Thyroid Function Tests: No results for input(s): TSH, T4TOTAL, T3FREE, THYROIDAB in the last 72 hours.  Invalid input(s): FREET3 Anemia Panel: Recent Labs    10/28/17 1418  VITAMINB12 194  FOLATE 11.4  FERRITIN 26  TIBC 287  IRON 52     PHYSICAL EXAM General: Well developed, well nourished, in no acute distress HEENT:  Normocephalic and atramatic Neck:  No JVD.  Lungs: Clear bilaterally to auscultation and percussion. Heart: HRRR . Normal S1 and S2 without gallops or murmurs.  Abdomen:  Bowel sounds are positive, abdomen soft and non-tender  Msk:  Back normal, normal gait. Normal strength and tone for age. Extremities: No clubbing, cyanosis or edema.   Neuro: Alert and oriented X 3. Psych:  Good affect, responds appropriately  TELEMETRY: Sinus rhythm  ASSESSMENT AND PLAN: Intussusception of colon between cecum and ascending colon with possible cancer of the colon.  Patient has history of STEMI with two-vessel PCI with drug-eluting stent.  Denies any chest pain or shortness of breath.  EKG done today shows normal sinus rhythm old inferior wall MI with poor R wave progression and nonspecific ST-T changes.  If patient needs surgery advise proceeding with surgery but continue aspirin.  Active Problems:   Abdominal pain   Intussusception (HCC)   Protein-calorie malnutrition, severe    Muskan Bolla A, MD, [facility] 10/30/2017 9:41 AM

## 2017-10-30 NOTE — Progress Notes (Signed)
10/30/2017  Subjective: Patient had colonoscopy yesterday with Dr. Marius Ditch and was found to have a cecal mass, likely malignant, and a mass in the rectosigmoid, also suspicious.  Biopsies were taken and are currently pending.  Vital signs: Temp:  [97.5 F (36.4 C)-98.3 F (36.8 C)] 97.5 F (36.4 C) (10/28 1234) Pulse Rate:  [57-67] 63 (10/28 1234) Resp:  [16-18] 16 (10/28 1234) BP: (122-133)/(58-65) 122/60 (10/28 1234) SpO2:  [97 %-100 %] 100 % (10/28 1234)   Intake/Output: 10/27 0701 - 10/28 0700 In: 1056.1 [P.O.:600; I.V.:456.1] Out: 1100 [Urine:1100] Last BM Date: 10/29/17  Physical Exam: Constitutional: No acute distress Abdomen:  Soft, nondistended, nontender.  Labs:  Recent Labs    10/28/17 0730 10/30/17 0533  WBC 7.4 7.2  HGB 11.3* 11.3*  HCT 35.9* 35.1*  PLT 231 231   Recent Labs    10/28/17 0730  NA 140  K 3.9  CL 108  CO2 26  GLUCOSE 196*  BUN 14  CREATININE 0.95  CALCIUM 8.6*   No results for input(s): LABPROT, INR in the last 72 hours.  Imaging: No results found.  Assessment/Plan: This is a 77 y.o. 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  --Discussed with the patient that we want to wait for pathology to confirm diagnosis.  Also would order pelvis MRI for better delineation of where the rectosigmoid mass is.  If it is truly a sigmoid mass, then we can proceed with surgery for both masses.  However, if it is more rectal, then would need neoadjuvant treatment first. --CEA ordered and pending. --CT chest, abdomen, pelvis already done and no evidence of other disease. --appreciate cardiology input.  Will continue aspirin. --Plans currently for surgery on Wednesday 10/30.  Will do bowel prep again tomorrow.   Melvyn Neth, [facility]

## 2017-10-30 NOTE — Consult Note (Signed)
[place] CONSULT NOTE  Patient Care Team: Kym Groom Guy Begin, MD as PCP - General (Family Medicine)  CHIEF COMPLAINTS/PURPOSE OF CONSULTATION:  Colon mass  HISTORY OF PRESENTING ILLNESS:  [name] 77 y.o.  male with a history of diabetes CAD is currently admitted to hospital for abdominal pain going on for the last many months.  Patient denies having prior colonoscopy.  Patient states given his worsening abdominal pain he was referred to the emergency room-CT scan showed possible ileocolic intussusception; but no evidence of any obvious metastatic disease in the liver.  Given the concerns of underlying mass patient had a colonoscopy that showed-circumferential partially obstructing large fungating mass in the proximal ascending colon; and also rectosigmoid mass [27 cm from the anal verge];partially circumferential (involving one third of the lumen circumference). The mass measured two cm in length.  Pathology is currently pending.  Given the above concerning findings of malignancy oncology has been consulted.   Patient denies any constipation.  Denies any blood in stools or black or stools.  No nausea vomiting.  Currently on clear liquid diet.  Patient continues to complain of abdominal pain.   Review of Systems  Constitutional: Negative for chills, diaphoresis, fever, malaise/fatigue and weight loss.  HENT: Negative for nosebleeds and sore throat.   Eyes: Negative for double vision.  Respiratory: Negative for cough, hemoptysis, sputum production, shortness of breath and wheezing.   Cardiovascular: Negative for chest pain, palpitations, orthopnea and leg swelling.  Gastrointestinal: Positive for abdominal pain and nausea. Negative for blood in stool, constipation, diarrhea, heartburn, melena and vomiting.  Genitourinary: Negative for dysuria, frequency and urgency.  Musculoskeletal: Negative for back pain and joint pain.  Skin: Negative.  Negative for itching  and rash.  Neurological: Negative for dizziness, tingling, focal weakness, weakness and headaches.  Endo/Heme/Allergies: Does not bruise/bleed easily.  Psychiatric/Behavioral: Negative for depression. The patient is not nervous/anxious and does not have insomnia.      MEDICAL HISTORY:  Past Medical History:  Diagnosis Date  . BPH (benign prostatic hyperplasia)   . CAD (coronary artery disease)    a. 10/2014 Ant STEMI/PCI: LM nl, LAD 131m (2.75x38 Xience DES) - R->L collats, D1 60, D2 90ost, LCX 20ost, OM1/2/3 min irregs, RCA 62m (2.75x15 Xience DES), 50d, RPDA small;  b. 10/2014 Echo: EF 55-60%, mod ant/ap HK, Gr1 DD.  . Diabetes mellitus without complication ([place])   . Essential hypertension   . Hyperlipidemia   . Osteoarthritis     SURGICAL HISTORY: Past Surgical History:  Procedure Laterality Date  . APPENDECTOMY     open  . CARDIAC CATHETERIZATION N/A 10/20/2014   Procedure: Left Heart Cath and Coronary Angiography;  Surgeon: Wellington Hampshire, MD;  Location: [facility];  Service: Cardiovascular;  Laterality: N/A;  . CARDIAC CATHETERIZATION Right 10/20/2014   Procedure: Coronary Stent Intervention;  Surgeon: Wellington Hampshire, MD;  Location: [facility];  Service: Cardiovascular;  Laterality: Right;  LAD/RCA Stent placement  . CHOLECYSTECTOMY, LAPAROSCOPIC      SOCIAL HISTORY: Social History   Socioeconomic History  . Marital status: Widowed    Spouse name: Not on file  . Number of children: Not on file  . Years of education: Not on file  . Highest education level: Not on file  Occupational History  . Not on file  Social Needs  . Financial resource strain: Not on file  . Food insecurity:    Worry: Not on file    Inability:  Not on file  . Transportation needs:    Medical: Not on file    Non-medical: Not on file  Tobacco Use  . Smoking status: Never Smoker  . Smokeless tobacco: Never Used  Substance and Sexual Activity  . Alcohol use: No  .  Drug use: No  . Sexual activity: Not on file  Lifestyle  . Physical activity:    Days per week: Not on file    Minutes per session: Not on file  . Stress: Not on file  Relationships  . Social connections:    Talks on phone: Not on file    Gets together: Not on file    Attends religious service: Not on file    Active member of club or organization: Not on file    Attends meetings of clubs or organizations: Not on file    Relationship status: Not on file  . Intimate partner violence:    Fear of current or ex partner: Not on file    Emotionally abused: Not on file    Physically abused: Not on file    Forced sexual activity: Not on file  Other Topics Concern  . Not on file  Social History Narrative  . Not on file    FAMILY HISTORY: Family History  Problem Relation Age of Onset  . Stroke Mother   . Diabetes Sister   . Diabetes Brother     ALLERGIES:  has No Known Allergies.  MEDICATIONS:  Current Facility-Administered Medications  Medication Dose Route Frequency Provider Last Rate Last Dose  . 0.9 %  sodium chloride infusion   Intravenous Continuous Henreitta Leber, MD 50 mL/hr at 10/30/17 0419    . acetaminophen (TYLENOL) tablet 650 mg  650 mg Oral Q6H PRN Henreitta Leber, MD   650 mg at 10/28/17 1937   Or  . acetaminophen (TYLENOL) suppository 650 mg  650 mg Rectal Q6H PRN Henreitta Leber, MD      . aspirin EC tablet 81 mg  81 mg Oral Daily Henreitta Leber, MD   81 mg at 10/28/17 0856  . carvedilol (COREG) tablet 3.125 mg  3.125 mg Oral BID WC Henreitta Leber, MD   3.125 mg at 10/29/17 1712  . enoxaparin (LOVENOX) injection 40 mg  40 mg Subcutaneous Q24H Henreitta Leber, MD   40 mg at 10/29/17 2149  . feeding supplement (ENSURE ENLIVE) (ENSURE ENLIVE) liquid 237 mL  237 mL Oral TID BM Mody, Sital, MD      . insulin aspart (novoLOG) injection 0-5 Units  0-5 Units Subcutaneous QHS Sainani, Vivek J, MD      . insulin aspart (novoLOG) injection 0-9 Units  0-9 Units  Subcutaneous TID WC Henreitta Leber, MD   5 Units at 10/29/17 1708  . morphine 2 MG/ML injection 2 mg  2 mg Intravenous Q4H PRN Henreitta Leber, MD      . nitroGLYCERIN (NITROSTAT) SL tablet 0.4 mg  0.4 mg Sublingual Q5 min PRN Henreitta Leber, MD      . ondansetron (ZOFRAN) tablet 4 mg  4 mg Oral Q6H PRN Henreitta Leber, MD       Or  . ondansetron (ZOFRAN) injection 4 mg  4 mg Intravenous Q6H PRN Henreitta Leber, MD      . tamsulosin (FLOMAX) capsule 0.4 mg  0.4 mg Oral Daily Henreitta Leber, MD   0.4 mg at 10/28/17 0856  . vitamin B-12 (CYANOCOBALAMIN) tablet 1,000 mcg  1,000 mcg Oral Daily Bettey Costa, MD          .  PHYSICAL EXAMINATION:  Vitals:   10/29/17 2043 10/30/17 0521  BP: 133/65 (!) 125/58  Pulse: 67 (!) 57  Resp: 18 16  Temp: 98.3 F (36.8 C) 98 F (36.7 C)  SpO2: 99% 97%   Filed Weights   10/27/17 1405 10/29/17 1410  Weight: 144 lb (65.3 kg) 141 lb 8.6 oz (64.2 kg)    Physical Exam  Constitutional: He is oriented to person, place, and time and well-developed, well-nourished, and in no distress.  Patient is alone.  HENT:  Head: Normocephalic and atraumatic.  Mouth/Throat: Oropharynx is clear and moist. No oropharyngeal exudate.  Eyes: Pupils are equal, round, and reactive to light.  Neck: Normal range of motion. Neck supple.  Cardiovascular: Normal rate and regular rhythm.  Pulmonary/Chest: No respiratory distress. He has no wheezes.  Abdominal: Soft. Bowel sounds are normal. He exhibits no distension and no mass. There is tenderness. There is no rebound and no guarding.  Abdominal tenderness on deep palpation.  No rigidity or guarding.  Musculoskeletal: Normal range of motion. He exhibits no edema or tenderness.  Neurological: He is alert and oriented to person, place, and time.  Skin: Skin is warm.  Psychiatric: Affect normal.     LABORATORY DATA:  I have reviewed the data as listed Lab Results  Component Value Date   WBC 7.2 10/30/2017    HGB 11.3 (L) 10/30/2017   HCT 35.1 (L) 10/30/2017   MCV 81.4 10/30/2017   PLT 231 10/30/2017   Recent Labs    10/27/17 1415 10/28/17 0730  NA 136 140  K 4.1 3.9  CL 103 108  CO2 26 26  GLUCOSE 349* 196*  BUN 18 14  CREATININE 1.02 0.95  CALCIUM 9.3 8.6*  GFRNONAA >60 >60  GFRAA >60 >60  PROT 7.0  --   ALBUMIN 3.5  --   AST 11*  --   ALT 8  --   ALKPHOS 83  --   BILITOT 0.5  --     RADIOGRAPHIC STUDIES: I have personally reviewed the radiological images as listed and agreed with the findings in the report. Ct Chest W Contrast  Result Date: 10/25/2017 CLINICAL DATA:  Ongoing abdominal pain for the past 3-4 months with loss of appetite and chest pain. Smoking history x 15 years. EXAM: CT CHEST, ABDOMEN, AND PELVIS WITH CONTRAST TECHNIQUE: Multidetector CT imaging of the chest, abdomen and pelvis was performed following the standard protocol during bolus administration of intravenous contrast. CONTRAST:  63mL ISOVUE-300 IOPAMIDOL (ISOVUE-300) INJECTION 61% COMPARISON:  Multiple exams, including CT abdomen from 07/25/2007 FINDINGS: CT CHEST FINDINGS Cardiovascular: Coronary, aortic arch, and branch vessel atherosclerotic vascular disease. Mediastinum/Nodes: Unremarkable Lungs/Pleura: Unremarkable Musculoskeletal: Healed right clavicular deformity compatible with old fracture. Healed right posterior rib fractures. Healed left posterolateral rib fracture. Thoracic spondylosis. Lower cervical and upper thoracic Schmorl's nodes. CT ABDOMEN PELVIS FINDINGS Hepatobiliary: Cholecystectomy. 6 by 4 mm hypodense lesion posteriorly in segment 7 of the liver on image 55/2 and image 105/4, reduced in size and conspicuity compared to 07/25/2007, highly likely to be benign. Pancreas: Unremarkable Spleen: Unremarkable Adrenals/Urinary Tract: 1.4 cm cyst of the left kidney upper pole. Adrenal glands normal. No urinary tract calculi. Urinary bladder unremarkable. Stomach/Bowel: Ileocolic intussusception  along a 5 cm segment. I cannot exclude an underlying mass or polyp along the somewhat masslike appearance of the intussusception. No small bowel dilatation. A right ileo colic  node measures 1.1 cm in short axis on image 83/2, mildly prominent. Prominent stool throughout the colon favors constipation. Vascular/Lymphatic: Mild abdominal aortic atherosclerotic calcification. Scattered small mesenteric lymph nodes. Reproductive: The median lobe of the prostate mildly indents the bladder base and has a somewhat nodular contour. The prostate measures 5.2 by 3.5 by 5.7 cm (volume = 54 cm^3). Other: Faint stranding in the mesentery is observed. Slight halo of adipose tissue around the small central mesenteric lymph nodes in this region. Musculoskeletal: Bridging spurring of the sacroiliac joints. Congenitally short pedicles in the lumbar spine with lumbar spondylosis and degenerative disc disease causing impingement at L3-4, L4-5, and L5-S1. IMPRESSION: 1. Short-segment ileocolic intussusception. No dilated small bowel to suggest obstruction. I cannot exclude a malignant mass or polyp in the vicinity of the cecum/ileocecal valve, and usually intussusceptions in adults do have an underlying lesion. Gastroenterology or surgical referral recommended. 2. Stranding in the central mesentery is observed with a halo of adipose tissue around the small central mesenteric lymph nodes involved, favoring sclerosing mesenteritis. 3. Other imaging findings of potential clinical significance: Aortic Atherosclerosis (ICD10-I70.0). Coronary atherosclerosis. Prominent stool throughout the colon favors constipation. Mild prostatomegaly. Mild lower lumbar impingement due to spondylosis and degenerative disc disease. Electronically Signed   By: Van Clines M.D.   On: 10/25/2017 17:04   Ct Abdomen Pelvis W Contrast  Result Date: 10/25/2017 CLINICAL DATA:  Ongoing abdominal pain for the past 3-4 months with loss of appetite and chest  pain. Smoking history x 15 years. EXAM: CT CHEST, ABDOMEN, AND PELVIS WITH CONTRAST TECHNIQUE: Multidetector CT imaging of the chest, abdomen and pelvis was performed following the standard protocol during bolus administration of intravenous contrast. CONTRAST:  50mL ISOVUE-300 IOPAMIDOL (ISOVUE-300) INJECTION 61% COMPARISON:  Multiple exams, including CT abdomen from 07/25/2007 FINDINGS: CT CHEST FINDINGS Cardiovascular: Coronary, aortic arch, and branch vessel atherosclerotic vascular disease. Mediastinum/Nodes: Unremarkable Lungs/Pleura: Unremarkable Musculoskeletal: Healed right clavicular deformity compatible with old fracture. Healed right posterior rib fractures. Healed left posterolateral rib fracture. Thoracic spondylosis. Lower cervical and upper thoracic Schmorl's nodes. CT ABDOMEN PELVIS FINDINGS Hepatobiliary: Cholecystectomy. 6 by 4 mm hypodense lesion posteriorly in segment 7 of the liver on image 55/2 and image 105/4, reduced in size and conspicuity compared to 07/25/2007, highly likely to be benign. Pancreas: Unremarkable Spleen: Unremarkable Adrenals/Urinary Tract: 1.4 cm cyst of the left kidney upper pole. Adrenal glands normal. No urinary tract calculi. Urinary bladder unremarkable. Stomach/Bowel: Ileocolic intussusception along a 5 cm segment. I cannot exclude an underlying mass or polyp along the somewhat masslike appearance of the intussusception. No small bowel dilatation. A right ileo colic node measures 1.1 cm in short axis on image 83/2, mildly prominent. Prominent stool throughout the colon favors constipation. Vascular/Lymphatic: Mild abdominal aortic atherosclerotic calcification. Scattered small mesenteric lymph nodes. Reproductive: The median lobe of the prostate mildly indents the bladder base and has a somewhat nodular contour. The prostate measures 5.2 by 3.5 by 5.7 cm (volume = 54 cm^3). Other: Faint stranding in the mesentery is observed. Slight halo of adipose tissue around the  small central mesenteric lymph nodes in this region. Musculoskeletal: Bridging spurring of the sacroiliac joints. Congenitally short pedicles in the lumbar spine with lumbar spondylosis and degenerative disc disease causing impingement at L3-4, L4-5, and L5-S1. IMPRESSION: 1. Short-segment ileocolic intussusception. No dilated small bowel to suggest obstruction. I cannot exclude a malignant mass or polyp in the vicinity of the cecum/ileocecal valve, and usually intussusceptions in adults do have an underlying lesion. Gastroenterology  or surgical referral recommended. 2. Stranding in the central mesentery is observed with a halo of adipose tissue around the small central mesenteric lymph nodes involved, favoring sclerosing mesenteritis. 3. Other imaging findings of potential clinical significance: Aortic Atherosclerosis (ICD10-I70.0). Coronary atherosclerosis. Prominent stool throughout the colon favors constipation. Mild prostatomegaly. Mild lower lumbar impingement due to spondylosis and degenerative disc disease. Electronically Signed   By: Van Clines M.D.   On: 10/25/2017 17:04    ASSESSMENT & PLAN:   #77 year old male patient history of CAD diabetes is currently admitted to hospital for ongoing abdominal pain status post colonoscopy shows to have likely malignancy  #Likely colonic malignancy-pending pathology a] Large partially obstructing circumferential fungating mass in proximal ascending colon; # b] infiltrative and ulcerated non-obstructing medium-sized mass was found in the recto-sigmoid [ 27 cm proximal to the anus]. Check CEA.  No obvious evidence of metastatic disease based on imaging.  # CAD; status post cardiac evaluation at this admission for preop.  #Diabetes poorly controlled-blood sugars 349/defer to primary service. Check HbA1c.   #Recommendations: I had a long discussion with the patient regarding the high possibility of above colonic masses being malignancy.  However await  final pathology.  Discussed that surgery would be definitive treatment for his malignancy.  It is likely that patient has 2 malignancies [right and left colon]-however right colonic malignancy seems to be more symptomatic.  In general preop chemoradiation therapy is recommended for rectal malignancy; however given his high rectal/sigmoid-especially in the light of his partially obstructing right colon malignancy-preop chemoradiation therapy for the left sided malignancy could be avoided.  Will discuss with Dr.Piscoya.   #Also discussed with patient's daughter Milly Jakob #[phone]].   Thank you Dr.Mody for allowing me to participate in the care of your pleasant patient. Please do not hesitate to contact me with questions or concerns in the interim.  All questions were answered. The patient knows to call the clinic with any problems, questions or concerns.    Cammie Sickle, MD 10/30/2017 8:25 AM

## 2017-10-30 NOTE — Progress Notes (Signed)
*  PRELIMINARY RESULTS* Echocardiogram 2D Echocardiogram has been performed.  Sherrie Sport 10/30/2017, 10:54 AM

## 2017-10-30 NOTE — Care Management Important Message (Signed)
Copy of signed IM left with patient in room.  

## 2017-10-31 ENCOUNTER — Encounter: Payer: Self-pay | Admitting: Anesthesiology

## 2017-10-31 ENCOUNTER — Inpatient Hospital Stay: Payer: Medicare HMO

## 2017-10-31 DIAGNOSIS — C182 Malignant neoplasm of ascending colon: Secondary | ICD-10-CM

## 2017-10-31 DIAGNOSIS — C19 Malignant neoplasm of rectosigmoid junction: Secondary | ICD-10-CM

## 2017-10-31 LAB — BASIC METABOLIC PANEL
Anion gap: 5 (ref 5–15)
BUN: 7 mg/dL — ABNORMAL LOW (ref 8–23)
CHLORIDE: 111 mmol/L (ref 98–111)
CO2: 24 mmol/L (ref 22–32)
CREATININE: 0.81 mg/dL (ref 0.61–1.24)
Calcium: 8.9 mg/dL (ref 8.9–10.3)
GFR calc non Af Amer: >60 mL/min (ref >60)
Glucose, Bld: 153 mg/dL — ABNORMAL HIGH (ref 70–99)
Potassium: 3.4 mmol/L — ABNORMAL LOW (ref 3.5–5.1)
SODIUM: 140 mmol/L (ref 135–145)

## 2017-10-31 LAB — GLUCOSE, CAPILLARY
GLUCOSE-CAPILLARY: 183 mg/dL — AB (ref 70–99)
Glucose-Capillary: 142 mg/dL — ABNORMAL HIGH (ref 70–99)
Glucose-Capillary: 267 mg/dL — ABNORMAL HIGH (ref 70–99)
Glucose-Capillary: 135 mg/dL — ABNORMAL HIGH (ref 70–99)

## 2017-10-31 LAB — CBC
HCT: 34.5 % — ABNORMAL LOW (ref 39.0–52.0)
Hemoglobin: 11.2 g/dL — ABNORMAL LOW (ref 13.0–17.0)
MCH: 26.1 pg (ref 26.0–34.0)
MCHC: 32.5 g/dL (ref 30.0–36.0)
MCV: 80.4 fL (ref 80.0–100.0)
NRBC: 0 % (ref 0.0–0.2)
Platelets: 233 10*3/uL (ref 150–400)
RBC: 4.29 MIL/uL (ref 4.22–5.81)
RDW: 14.1 % (ref 11.5–15.5)
WBC: 6.3 10*3/uL (ref 4.0–10.5)

## 2017-10-31 LAB — SURGICAL PCR SCREEN
MRSA, PCR: NEGATIVE
Staphylococcus aureus: NEGATIVE

## 2017-10-31 LAB — SURGICAL PATHOLOGY

## 2017-10-31 LAB — CEA: CEA: 1.5 ng/mL (ref 0.0–4.7)

## 2017-10-31 MED ORDER — MUPIROCIN 2 % EX OINT
1.0000 "application " | TOPICAL_OINTMENT | Freq: Two times a day (BID) | CUTANEOUS | Status: DC
  Filled 2017-10-31: qty 22

## 2017-10-31 MED ORDER — BOOST / RESOURCE BREEZE PO LIQD CUSTOM
1.0000 | Freq: Three times a day (TID) | ORAL | Status: DC
  Administered 2017-10-31: 1 via ORAL

## 2017-10-31 MED ORDER — MAGNESIUM CITRATE PO SOLN
1.0000 | Freq: Once | ORAL | Status: AC
  Administered 2017-10-31: 1 via ORAL
  Filled 2017-10-31: qty 296

## 2017-10-31 MED ORDER — GADOBUTROL 1 MMOL/ML IV SOLN
6.5000 mL | Freq: Once | INTRAVENOUS | Status: AC | PRN
  Administered 2017-10-31: 6.5 mL via INTRAVENOUS

## 2017-10-31 MED ORDER — ERYTHROMYCIN BASE 250 MG PO TBEC
1000.0000 mg | DELAYED_RELEASE_TABLET | Freq: Four times a day (QID) | ORAL | Status: AC
  Administered 2017-10-31 – 2017-11-01 (×3): 1000 mg via ORAL
  Filled 2017-10-31 (×5): qty 4

## 2017-10-31 MED ORDER — NEOMYCIN SULFATE 500 MG PO TABS
1000.0000 mg | ORAL_TABLET | Freq: Four times a day (QID) | ORAL | Status: AC
  Administered 2017-10-31 – 2017-11-01 (×3): 1000 mg via ORAL
  Filled 2017-10-31 (×3): qty 2

## 2017-10-31 MED ORDER — POTASSIUM CHLORIDE CRYS ER 20 MEQ PO TBCR
40.0000 meq | EXTENDED_RELEASE_TABLET | Freq: Once | ORAL | Status: AC
  Administered 2017-10-31: 40 meq via ORAL
  Filled 2017-10-31: qty 2

## 2017-10-31 MED ORDER — ALVIMOPAN 12 MG PO CAPS
12.0000 mg | ORAL_CAPSULE | ORAL | Status: AC
  Administered 2017-11-01: 12 mg via ORAL
  Filled 2017-10-31: qty 1

## 2017-10-31 MED ORDER — ADULT MULTIVITAMIN W/MINERALS CH
1.0000 | ORAL_TABLET | Freq: Every day | ORAL | Status: DC
  Administered 2017-10-31 – 2017-11-06 (×7): 1 via ORAL
  Filled 2017-10-31 (×7): qty 1

## 2017-10-31 MED ORDER — LACTATED RINGERS IV SOLN
INTRAVENOUS | Status: DC
  Administered 2017-11-01 (×2): via INTRAVENOUS

## 2017-10-31 NOTE — Anesthesia Preprocedure Evaluation (Addendum)
Anesthesia Evaluation  Patient identified by MRN, date of birth, ID band Patient awake    Reviewed: Allergy & Precautions, H&P , NPO status , Patient's Chart, lab work & pertinent test results, reviewed documented beta blocker date and time   History of Anesthesia Complications Negative for: history of anesthetic complications  Airway Mallampati: II  TM Distance: >3 FB Neck ROM: full    Dental  (+) Upper Dentures, Lower Dentures   Pulmonary neg pulmonary ROS,           Cardiovascular Exercise Tolerance: Good hypertension, Pt. on medications and Pt. on home beta blockers (-) angina+ CAD, + Past MI and + Cardiac Stents  (-) CABG (-) dysrhythmias (-) Valvular Problems/Murmurs     Neuro/Psych  Neuromuscular disease negative psych ROS   GI/Hepatic Neg liver ROS, GERD  ,  Endo/Other  negative endocrine ROSdiabetes, Type 2  Renal/GU Renal disease  negative genitourinary   Musculoskeletal  (+) Arthritis , Osteoarthritis,    Abdominal   Peds  Hematology negative hematology ROS (+)   Anesthesia Other Findings Past Medical History: No date: BPH (benign prostatic hyperplasia) No date: CAD (coronary artery disease)     Comment:  a. 10/2014 Ant STEMI/PCI: LM nl, LAD 160m (2.75x38               Xience DES) - R->L collats, D1 60, D2 90ost, LCX 20ost,               OM1/2/3 min irregs, RCA 69m (2.75x15 Xience DES), 50d,               RPDA small;  b. 10/2014 Echo: EF 55-60%, mod ant/ap HK,               Gr1 DD. No date: Diabetes mellitus without complication (HCC) No date: Essential hypertension No date: Hyperlipidemia No date: Osteoarthritis   Reproductive/Obstetrics negative OB ROS                           Anesthesia Physical  Anesthesia Plan  ASA: III  Anesthesia Plan: General   Post-op Pain Management:    Induction: Intravenous  PONV Risk Score and Plan:   Airway Management Planned:  Oral ETT  Additional Equipment:   Intra-op Plan:   Post-operative Plan: Extubation in OR  Informed Consent: I have reviewed the patients History and Physical, chart, labs and discussed the procedure including the risks, benefits and alternatives for the proposed anesthesia with the patient or authorized representative who has indicated his/her understanding and acceptance.   Dental Advisory Given  Plan Discussed with: Anesthesiologist, CRNA and Surgeon  Anesthesia Plan Comments: (ECHO ok. Hb 11.4. HR in the 50's.)       Anesthesia Quick Evaluation

## 2017-10-31 NOTE — Progress Notes (Signed)
SUBJECTIVE: Patient is waiting to see surgery.     Vitals:   10/30/17 0521 10/30/17 1234 10/30/17 2029 10/31/17 0509  BP: (!) 125/58 122/60 (!) 117/55 129/64  Pulse: (!) 57 63 63 (!) 54  Resp: 16 16 20 20   Temp: 98 F (36.7 C) (!) 97.5 F (36.4 C) 98 F (36.7 C) 97.8 F (36.6 C)  TempSrc: Oral Oral Oral Oral  SpO2: 97% 100% 98% 98%  Weight:      Height:        Intake/Output Summary (Last 24 hours) at 10/31/2017 0715 Last data filed at 10/31/2017 0509 Gross per 24 hour  Intake 1126.41 ml  Output 1580 ml  Net -453.59 ml    LABS: Basic Metabolic Panel: Recent Labs    10/28/17 0730 10/31/17 0405  NA 140 140  K 3.9 3.4*  CL 108 111  CO2 26 24  GLUCOSE 196* 153*  BUN 14 7*  CREATININE 0.95 0.81  CALCIUM 8.6* 8.9   Liver Function Tests: No results for input(s): AST, ALT, ALKPHOS, BILITOT, PROT, ALBUMIN in the last 72 hours. No results for input(s): LIPASE, AMYLASE in the last 72 hours. CBC: Recent Labs    10/30/17 0533 10/31/17 0405  WBC 7.2 6.3  HGB 11.3* 11.2*  HCT 35.1* 34.5*  MCV 81.4 80.4  PLT 231 233   Cardiac Enzymes: No results for input(s): CKTOTAL, CKMB, CKMBINDEX, TROPONINI in the last 72 hours. BNP: Invalid input(s): POCBNP D-Dimer: No results for input(s): DDIMER in the last 72 hours. Hemoglobin A1C: Recent Labs    10/30/17 0533  HGBA1C 9.7*   Fasting Lipid Panel: No results for input(s): CHOL, HDL, LDLCALC, TRIG, CHOLHDL, LDLDIRECT in the last 72 hours. Thyroid Function Tests: No results for input(s): TSH, T4TOTAL, T3FREE, THYROIDAB in the last 72 hours.  Invalid input(s): FREET3 Anemia Panel: Recent Labs    10/28/17 1418  VITAMINB12 194  FOLATE 11.4  FERRITIN 26  TIBC 287  IRON 52     PHYSICAL EXAM General: Well developed, well nourished, in no acute distress HEENT:  Normocephalic and atramatic Neck:  No JVD.  Lungs: Clear bilaterally to auscultation and percussion. Heart: HRRR . Normal S1 and S2 without gallops or  murmurs.  Abdomen: Bowel sounds are positive, abdomen soft and non-tender  Msk:  Back normal, normal gait. Normal strength and tone for age. Extremities: No clubbing, cyanosis or edema.   Neuro: Alert and oriented X 3. Psych:  Good affect, responds appropriately  TELEMETRY: Normal sinus rhythm F2  ASSESSMENT AND PLAN: Intussusception of large intestine with history of coronary artery disease status post PCI RCA and LAD.  If patient needs surgery advise proceeding with surgery as LV function is normal.  Active Problems:   Abdominal pain   Intussusception (HCC)   Protein-calorie malnutrition, severe    Annistyn Depass A, MD, [facility] 10/31/2017 7:15 AM

## 2017-10-31 NOTE — Progress Notes (Signed)
[place] [facility] [place] NAME: [name]    MR#:  [id number]  DATE OF BIRTH:  1940/07/21  SUBJECTIVE:  No concerns this morning.  Still noting some generalized abdominal pain in the epigastric and periumbilical area.  No nausea, no vomiting.  REVIEW OF SYSTEMS:    Constitutional: Negative for fever, chills weight loss HENT: Negative for ear pain, nosebleeds, congestion, facial swelling, rhinorrhea, neck pain, neck stiffness and ear discharge.   Respiratory: Negative for cough, shortness of breath, wheezing  Cardiovascular: Negative for chest pain, palpitations and leg swelling.  Gastrointestinal: Negative for heartburn, no vomiting, diarrhea or constipation. +abdominal pain Genitourinary: Negative for dysuria, urgency, frequency, hematuria Musculoskeletal: Negative for back pain or joint pain Neurological: Negative for dizziness, seizures, syncope, focal weakness,  numbness and headaches.  Hematological: Does not bruise/bleed easily.  Psychiatric/Behavioral: Negative for hallucinations, confusion, dysphoric mood  Tolerating Diet: yes  DRUG ALLERGIES:  No Known Allergies  VITALS:  Blood pressure (!) 146/60, pulse 63, temperature 97.8 F (36.6 C), temperature source Oral, resp. rate 20, height 5\' 9"  (1.753 m), weight 64.2 kg, SpO2 100 %.  PHYSICAL EXAMINATION:  Constitutional: Appears well-developed and well-nourished. No distress. HENT: Normocephalic. Atraumatic. Oropharynx is clear and moist.  Eyes: Conjunctivae and EOM are normal. PERRLA, no scleral icterus.  Neck: Normal ROM. Neck supple. No JVD. No tracheal deviation. CVS: RRR, S1/S2 +, no murmurs, no gallops, no carotid bruit.  Pulmonary: Effort and breath sounds normal, no stridor, rhonchi, wheezes, rales.  Abdominal: +moderate generalized tenderness to palpation, worse in the epigastric and periumbilical areas, no distension, no rebound, no guarding musculoskeletal: Normal range  of motion. No edema and no tenderness.  Neuro: Awake, alert, moving all extremities Skin: Skin is warm and dry. No rash noted. Psychiatric: normal thought content, normal judgment  LABORATORY PANEL:   CBC Recent Labs  Lab 10/31/17 0405  WBC 6.3  HGB 11.2*  HCT 34.5*  PLT 233   ------------------------------------------------------------------------------------------------------------------  Chemistries  Recent Labs  Lab 10/27/17 1415  10/31/17 0405  NA 136   < > 140  K 4.1   < > 3.4*  CL 103   < > 111  CO2 26   < > 24  GLUCOSE 349*   < > 153*  BUN 18   < > 7*  CREATININE 1.02   < > 0.81  CALCIUM 9.3   < > 8.9  AST 11*  --   --   ALT 8  --   --   ALKPHOS 83  --   --   BILITOT 0.5  --   --    < > = values in this interval not displayed.   ------------------------------------------------------------------------------------------------------------------  Cardiac Enzymes Recent Labs  Lab 10/27/17 1415  TROPONINI <0.03   ------------------------------------------------------------------------------------------------------------------  RADIOLOGY:  No results found.   ASSESSMENT AND PLAN:   77 year old male with history of CAD and diabetes who presents to the emergency room due to abdominal pain  1. Colonic masses with resulting ileocolic intussusception- s/p colonoscopy 10/27 with large partially obstructing mass in the ascending colon and medium-sized non-obstructing mass in the rectosigmoid colon. -Awaiting path results -Oncology following -MRI pelvis ordered to evaluate rectosigmoid mass -General surgery following- plan for right colectomy, sigmoidectomy, and loop ileostomy tomorrow  2.  Diabetes: Continue sliding scale and follow blood sugars  3.  BPH: Continue Flomax  4.  Essential hypertension: Continue Coreg  5.  CAD: Cleared for surgery by cardiology -Continue to  hold Plavix -Continue aspirin, Coreg  6. Iron deficiency anemia and B12 deficiency:  Iron deficiency likely due to colon mass.  Hemoglobin stable. -B12 supplement  Management plans discussed with the patient and he is in agreement.  CODE STATUS: Full  TOTAL TIME TAKING CARE OF THIS PATIENT: 35 minutes.   POSSIBLE D/C unknown,  DEPENDING ON CLINICAL CONDITION.   Berna Spare Latorya Bautch M.D on 10/31/2017 at 2:37 PM  Between 7am to 6pm - Pager - [phone] After 6pm go to [email or web address] - password [email or web address] [facility]  Office  [phone]  CC: Primary care physician; Valera Castle, MD  Note: This dictation was prepared with Dragon dictation along with smaller phrase technology. Any transcriptional errors that result from this process are unintentional.

## 2017-10-31 NOTE — Progress Notes (Signed)
10/31/2017  Subjective: No acute events overnight.  Patient's biopsy results pending but preliminary both the cecal mass and rectosigmoid mass are adenocarcinoma.  Vital signs: Temp:  [97.5 F (36.4 C)-98 F (36.7 C)] 97.8 F (36.6 C) (10/29 0509) Pulse Rate:  [54-63] 54 (10/29 0509) Resp:  [16-20] 20 (10/29 0509) BP: (117-129)/(55-64) 129/64 (10/29 0509) SpO2:  [98 %-100 %] 98 % (10/29 0509)   Intake/Output: 10/28 0701 - 10/29 0700 In: 1126.4 [I.V.:1126.4] Out: 9163 [Urine:1580] Last BM Date: 10/30/17  Physical Exam: Constitutional: No acute distress Abdomen:  Soft, non-distended, non-tender to palpation.  Labs:  Recent Labs    10/30/17 0533 10/31/17 0405  WBC 7.2 6.3  HGB 11.3* 11.2*  HCT 35.1* 34.5*  PLT 231 233   Recent Labs    10/31/17 0405  NA 140  K 3.4*  CL 111  CO2 24  GLUCOSE 153*  BUN 7*  CREATININE 0.81  CALCIUM 8.9   No results for input(s): LABPROT, INR in the last 72 hours.  Imaging: No results found.  Assessment/Plan: This is a 77 y.o. male with synchronous adenocarcinoma of cecum and rectosigmoid  --pelvic MRI pending today still to review anatomy and exact location of rectosigmoid mass prior to surgery. --bowel prep today  --clear liquids for now and NPO after midnight --OR planned for tomorrow -- right colectomy, sigmoidectomy, loop ileostomy.  Discussed with patient the risks of surgery including bleeding, infection, and injury to surrounding structures and he's willing to proceed.   Melvyn Neth, [facility]

## 2017-11-01 ENCOUNTER — Inpatient Hospital Stay: Payer: Medicare HMO | Admitting: Anesthesiology

## 2017-11-01 ENCOUNTER — Encounter: Payer: Self-pay | Admitting: Anesthesiology

## 2017-11-01 ENCOUNTER — Encounter
Admission: EM | Disposition: A | Discharge status: Home Health | Payer: Self-pay | Source: Home / Self Care | Attending: Internal Medicine

## 2017-11-01 ENCOUNTER — Other Ambulatory Visit: Payer: Self-pay

## 2017-11-01 DIAGNOSIS — C19 Malignant neoplasm of rectosigmoid junction: Secondary | ICD-10-CM

## 2017-11-01 HISTORY — PX: PARTIAL COLECTOMY: SHX5273

## 2017-11-01 LAB — GLUCOSE, CAPILLARY
GLUCOSE-CAPILLARY: 185 mg/dL — AB (ref 70–99)
GLUCOSE-CAPILLARY: 223 mg/dL — AB (ref 70–99)
GLUCOSE-CAPILLARY: 205 mg/dL — AB (ref 70–99)
Glucose-Capillary: 159 mg/dL — ABNORMAL HIGH (ref 70–99)
Glucose-Capillary: 94 mg/dL (ref 70–99)
Glucose-Capillary: 240 mg/dL — ABNORMAL HIGH (ref 70–99)

## 2017-11-01 LAB — BASIC METABOLIC PANEL
Anion gap: 5 (ref 5–15)
BUN: 9 mg/dL (ref 8–23)
CALCIUM: 8.9 mg/dL (ref 8.9–10.3)
CO2: 24 mmol/L (ref 22–32)
Chloride: 108 mmol/L (ref 98–111)
Creatinine, Ser: 0.81 mg/dL (ref 0.61–1.24)
GFR calc Af Amer: >60 mL/min (ref >60)
GLUCOSE: 206 mg/dL — AB (ref 70–99)
POTASSIUM: 3.8 mmol/L (ref 3.5–5.1)
SODIUM: 137 mmol/L (ref 135–145)

## 2017-11-01 LAB — CBC
HCT: 36.3 % — ABNORMAL LOW (ref 39.0–52.0)
Hemoglobin: 11.4 g/dL — ABNORMAL LOW (ref 13.0–17.0)
MCH: 25.9 pg — ABNORMAL LOW (ref 26.0–34.0)
MCHC: 31.4 g/dL (ref 30.0–36.0)
MCV: 82.3 fL (ref 80.0–100.0)
NRBC: 0 % (ref 0.0–0.2)
PLATELETS: 241 10*3/uL (ref 150–400)
RBC: 4.41 MIL/uL (ref 4.22–5.81)
RDW: 14.1 % (ref 11.5–15.5)
WBC: 7.3 10*3/uL (ref 4.0–10.5)

## 2017-11-01 LAB — TYPE AND SCREEN
ABO/RH(D): AB POS
Antibody Screen: NEGATIVE

## 2017-11-01 SURGERY — COLECTOMY, PARTIAL
Anesthesia: General | Site: Abdomen | Laterality: Right

## 2017-11-01 MED ORDER — ONDANSETRON HCL 4 MG/2ML IJ SOLN: 4.0000 mg | Freq: Once | INTRAMUSCULAR | Status: DC | PRN

## 2017-11-01 MED ORDER — SODIUM CHLORIDE 0.9 % IV SOLN: INTRAVENOUS | Status: DC | PRN

## 2017-11-01 MED ORDER — ROCURONIUM BROMIDE 100 MG/10ML IV SOLN
INTRAVENOUS | Status: DC | PRN
  Administered 2017-11-01: 10 mg via INTRAVENOUS
  Administered 2017-11-01: 40 mg via INTRAVENOUS
  Administered 2017-11-01: 50 mg via INTRAVENOUS

## 2017-11-01 MED ORDER — SODIUM CHLORIDE 0.9 % IV SOLN
2.0000 g | INTRAVENOUS | Status: AC
  Administered 2017-11-01: 2 g via INTRAVENOUS
  Filled 2017-11-01: qty 2

## 2017-11-01 MED ORDER — SEVOFLURANE IN SOLN
RESPIRATORY_TRACT | Status: AC
  Filled 2017-11-01: qty 250

## 2017-11-01 MED ORDER — SODIUM CHLORIDE 0.9 % IV SOLN
INTRAVENOUS | Status: DC | PRN
  Administered 2017-11-01: 40 ug/min via INTRAVENOUS

## 2017-11-01 MED ORDER — INSULIN ASPART 100 UNIT/ML ~~LOC~~ SOLN
SUBCUTANEOUS | Status: AC
  Filled 2017-11-01: qty 1

## 2017-11-01 MED ORDER — FENTANYL CITRATE (PF) 100 MCG/2ML IJ SOLN: 25.0000 ug | INTRAMUSCULAR | Status: DC | PRN

## 2017-11-01 MED ORDER — PROPOFOL 10 MG/ML IV BOLUS
INTRAVENOUS | Status: DC | PRN
  Administered 2017-11-01: 120 mg via INTRAVENOUS

## 2017-11-01 MED ORDER — PROPOFOL 10 MG/ML IV BOLUS
INTRAVENOUS | Status: AC
  Filled 2017-11-01: qty 20

## 2017-11-01 MED ORDER — BUPIVACAINE HCL (PF) 0.5 % IJ SOLN
INTRAMUSCULAR | Status: AC
  Filled 2017-11-01: qty 30

## 2017-11-01 MED ORDER — SODIUM CHLORIDE 0.9 % IJ SOLN
INTRAMUSCULAR | Status: AC
  Filled 2017-11-01: qty 10

## 2017-11-01 MED ORDER — BUPIVACAINE-EPINEPHRINE (PF) 0.25% -1:200000 IJ SOLN
INTRAMUSCULAR | Status: AC
  Filled 2017-11-01: qty 30

## 2017-11-01 MED ORDER — PHENYLEPHRINE HCL 10 MG/ML IJ SOLN
INTRAMUSCULAR | Status: DC | PRN
  Administered 2017-11-01 (×2): 100 ug via INTRAVENOUS

## 2017-11-01 MED ORDER — HYDROMORPHONE HCL 1 MG/ML IJ SOLN
0.5000 mg | INTRAMUSCULAR | Status: DC | PRN
  Administered 2017-11-01 – 2017-11-04 (×8): 0.5 mg via INTRAVENOUS
  Filled 2017-11-01 (×8): qty 0.5

## 2017-11-01 MED ORDER — SODIUM CHLORIDE 0.9 % IV SOLN
INTRAVENOUS | Status: DC
  Administered 2017-11-01: 19:00:00 via INTRAVENOUS

## 2017-11-01 MED ORDER — MIDAZOLAM HCL 2 MG/2ML IJ SOLN
INTRAMUSCULAR | Status: DC | PRN
  Administered 2017-11-01 (×2): 1 mg via INTRAVENOUS

## 2017-11-01 MED ORDER — DEXAMETHASONE SODIUM PHOSPHATE 4 MG/ML IJ SOLN
INTRAMUSCULAR | Status: DC | PRN
  Administered 2017-11-01: 10 mg via INTRAVENOUS

## 2017-11-01 MED ORDER — FENTANYL CITRATE (PF) 100 MCG/2ML IJ SOLN
INTRAMUSCULAR | Status: AC
  Filled 2017-11-01: qty 2

## 2017-11-01 MED ORDER — CHLORHEXIDINE GLUCONATE CLOTH 2 % EX PADS
6.0000 | MEDICATED_PAD | Freq: Once | CUTANEOUS | Status: AC
  Administered 2017-11-01 (×2): 6 via TOPICAL

## 2017-11-01 MED ORDER — ALBUMIN HUMAN 5 % IV SOLN
INTRAVENOUS | Status: AC
  Filled 2017-11-01: qty 250

## 2017-11-01 MED ORDER — LIDOCAINE HCL (CARDIAC) PF 100 MG/5ML IV SOSY
PREFILLED_SYRINGE | INTRAVENOUS | Status: DC | PRN
  Administered 2017-11-01: 60 mg via INTRAVENOUS

## 2017-11-01 MED ORDER — BUPIVACAINE LIPOSOME 1.3 % IJ SUSP
INTRAMUSCULAR | Status: AC
  Filled 2017-11-01: qty 20

## 2017-11-01 MED ORDER — EPHEDRINE SULFATE 50 MG/ML IJ SOLN
INTRAMUSCULAR | Status: DC | PRN
  Administered 2017-11-01: 10 mg via INTRAVENOUS

## 2017-11-01 MED ORDER — BUPIVACAINE-EPINEPHRINE (PF) 0.25% -1:200000 IJ SOLN
INTRAMUSCULAR | Status: DC | PRN
  Administered 2017-11-01: 30 mL via PERINEURAL

## 2017-11-01 MED ORDER — LACTATED RINGERS IV SOLN
INTRAVENOUS | Status: DC | PRN
  Administered 2017-11-01 (×3): via INTRAVENOUS

## 2017-11-01 MED ORDER — ACETAMINOPHEN 10 MG/ML IV SOLN
INTRAVENOUS | Status: AC
  Filled 2017-11-01: qty 100

## 2017-11-01 MED ORDER — BUPIVACAINE-EPINEPHRINE (PF) 0.5% -1:200000 IJ SOLN
INTRAMUSCULAR | Status: AC
  Filled 2017-11-01: qty 30

## 2017-11-01 MED ORDER — FENTANYL CITRATE (PF) 100 MCG/2ML IJ SOLN
INTRAMUSCULAR | Status: DC | PRN
  Administered 2017-11-01 (×6): 50 ug via INTRAVENOUS

## 2017-11-01 MED ORDER — KETAMINE HCL 10 MG/ML IJ SOLN
INTRAMUSCULAR | Status: DC | PRN
  Administered 2017-11-01: 30 mg via INTRAVENOUS
  Administered 2017-11-01: 20 mg via INTRAVENOUS
  Administered 2017-11-01: 100 mg via INTRAVENOUS

## 2017-11-01 MED ORDER — SODIUM CHLORIDE 0.9 % IV BOLUS
500.0000 mL | Freq: Once | INTRAVENOUS | Status: AC
  Administered 2017-11-01: 500 mL via INTRAVENOUS

## 2017-11-01 MED ORDER — LACTATED RINGERS IV SOLN
INTRAVENOUS | Status: DC
  Administered 2017-11-01 – 2017-11-03 (×5): via INTRAVENOUS

## 2017-11-01 MED ORDER — ACETAMINOPHEN 10 MG/ML IV SOLN
INTRAVENOUS | Status: DC | PRN
  Administered 2017-11-01: 1000 mg via INTRAVENOUS

## 2017-11-01 MED ORDER — INSULIN ASPART 100 UNIT/ML ~~LOC~~ SOLN
0.0000 [IU] | SUBCUTANEOUS | Status: DC
  Administered 2017-11-01 – 2017-11-02 (×3): 3 [IU] via SUBCUTANEOUS
  Administered 2017-11-02: 2 [IU] via SUBCUTANEOUS
  Administered 2017-11-02 (×3): 3 [IU] via SUBCUTANEOUS
  Administered 2017-11-03 (×3): 2 [IU] via SUBCUTANEOUS
  Filled 2017-11-01 (×9): qty 1

## 2017-11-01 MED ORDER — KETAMINE HCL 50 MG/ML IJ SOLN
INTRAMUSCULAR | Status: AC
  Filled 2017-11-01: qty 10

## 2017-11-01 MED ORDER — MIDAZOLAM HCL 2 MG/2ML IJ SOLN
INTRAMUSCULAR | Status: AC
  Filled 2017-11-01: qty 2

## 2017-11-01 MED ORDER — SUGAMMADEX SODIUM 500 MG/5ML IV SOLN
INTRAVENOUS | Status: AC
  Filled 2017-11-01: qty 5

## 2017-11-01 MED ORDER — SODIUM CHLORIDE 0.9 % IV SOLN
INTRAVENOUS | Status: DC | PRN
  Administered 2017-11-01: 30 mL

## 2017-11-01 SURGICAL SUPPLY — 62 items
CANISTER SUCT 1200ML W/VALVE (MISCELLANEOUS) ×3 IMPLANT
CHLORAPREP W/TINT 26ML (MISCELLANEOUS) ×3 IMPLANT
CLIP VESOCCLUDE LG 6/CT (CLIP) ×2 IMPLANT
COVER WAND RF STERILE (DRAPES) ×1 IMPLANT
DRAPE LAPAROTOMY 100X77 ABD (DRAPES) ×3 IMPLANT
DRAPE LEGGINS SURG 28X43 STRL (DRAPES) ×2 IMPLANT
DRAPE UNDER BUTTOCK W/FLU (DRAPES) ×1 IMPLANT
DRSG OPSITE POSTOP 4X12 (GAUZE/BANDAGES/DRESSINGS) ×2 IMPLANT
ELECT CAUTERY BLADE 6.4 (BLADE) ×3 IMPLANT
ELECT REM PT RETURN 9FT ADLT (ELECTROSURGICAL) ×3
ELECTRODE REM PT RTRN 9FT ADLT (ELECTROSURGICAL) ×1 IMPLANT
GAUZE SPONGE 4X4 12PLY STRL (GAUZE/BANDAGES/DRESSINGS) ×1 IMPLANT
GLOVE SURG SYN 7.0 (GLOVE) ×12 IMPLANT
GLOVE SURG SYN 7.0 PF PI (GLOVE) ×2 IMPLANT
GLOVE SURG SYN 7.5  E (GLOVE) ×8
GLOVE SURG SYN 7.5 E (GLOVE) ×4 IMPLANT
GLOVE SURG SYN 7.5 PF PI (GLOVE) ×2 IMPLANT
GOWN STRL REUS W/ TWL LRG LVL3 (GOWN DISPOSABLE) ×4 IMPLANT
GOWN STRL REUS W/TWL LRG LVL3 (GOWN DISPOSABLE) ×8
HANDLE SUCTION POOLE (INSTRUMENTS) IMPLANT
HANDLE YANKAUER SUCT BULB TIP (MISCELLANEOUS) ×2 IMPLANT
HEMOSTAT ARISTA ABSORB 3G PWDR (MISCELLANEOUS) ×2 IMPLANT
KIT TURNOVER KIT A (KITS) ×3 IMPLANT
LABEL OR SOLS (LABEL) ×3 IMPLANT
NEEDLE HYPO 22GX1.5 SAFETY (NEEDLE) ×3 IMPLANT
NS IRRIG 1000ML POUR BTL (IV SOLUTION) ×15 IMPLANT
PACK BASIN MAJOR ARMC (MISCELLANEOUS) ×3 IMPLANT
PACK COLON CLEAN CLOSURE (MISCELLANEOUS) ×3 IMPLANT
RELOAD PROXIMATE 75MM BLUE (ENDOMECHANICALS) ×12 IMPLANT
RELOAD STAPLE 75 3.8 BLU REG (ENDOMECHANICALS) IMPLANT
SEPRAFILM MEMBRANE 5X6 (MISCELLANEOUS) IMPLANT
SPONGE LAP 18X18 RF (DISPOSABLE) ×3 IMPLANT
STAPLER CIRCULAR 29MM (STAPLE) IMPLANT
STAPLER CUT CVD 40MM BLUE (STAPLE) ×2 IMPLANT
STAPLER ENDO ILS CVD 18 33 (STAPLE) ×2 IMPLANT
STAPLER PROXIMATE 75MM BLUE (STAPLE) ×2 IMPLANT
STAPLER RELOADABLE 65 2-0 SUT (MISCELLANEOUS) IMPLANT
STAPLER SKIN PROX 35W (STAPLE) ×1 IMPLANT
STAPLER SYS INTERNAL RELOAD SS (MISCELLANEOUS) IMPLANT
SUCTION POOLE HANDLE (INSTRUMENTS) ×3
SURGILUBE 2OZ TUBE FLIPTOP (MISCELLANEOUS) ×2 IMPLANT
SUT MNCRL 3-0 UNDYED SH (SUTURE) ×1 IMPLANT
SUT MONOCRYL 3-0 UNDYED (SUTURE)
SUT PDS AB 1 TP1 96 (SUTURE) ×6 IMPLANT
SUT PROLENE 2 0 FS (SUTURE) ×2 IMPLANT
SUT PROLENE 2-0 (SUTURE) ×2
SUT PROLENE 2-0 TS 14X2 ARM (SUTURE) ×1
SUT SILK 0 (SUTURE) ×2
SUT SILK 0 30XBRD TIE 6 (SUTURE) IMPLANT
SUT SILK 2-0 (SUTURE) ×1 IMPLANT
SUT SILK 3 0 (SUTURE) ×2
SUT SILK 3-0 (SUTURE) ×11 IMPLANT
SUT SILK 3-0 18XBRD TIE 12 (SUTURE) IMPLANT
SUT VIC AB 1 CTX 27 (SUTURE) ×3 IMPLANT
SUT VIC AB 3-0 SH 27 (SUTURE) ×8
SUT VIC AB 3-0 SH 27X BRD (SUTURE) IMPLANT
SUTURE PROLEN 2-0 TS 14X2 ARM (SUTURE) IMPLANT
SYR 10ML LL (SYRINGE) ×3 IMPLANT
SYR BULB IRRIG 60ML STRL (SYRINGE) ×2 IMPLANT
TRAY FOLEY MTR SLVR 16FR STAT (SET/KITS/TRAYS/PACK) ×3 IMPLANT
TUBING CONNECTING 10 (TUBING) ×1 IMPLANT
TUBING CONNECTING 10' (TUBING) ×1

## 2017-11-01 NOTE — Progress Notes (Signed)
11/01/2017  Subjective: No acute events overnight.  Patient had 2 bottles of magnesium citrate for bowel prep as well as erythromycin and neomycin.  He has had liquid bowel movements.  Denies any worsening abdominal pain reports there is still soreness as previously.  Vital signs: Temp:  [97.6 F (36.4 C)-98.3 F (36.8 C)] 97.6 F (36.4 C) (10/30 0720) Pulse Rate:  [54-63] 57 (10/30 0720) Resp:  [18-19] 18 (10/30 0720) BP: (93-146)/(44-60) 107/57 (10/30 0720) SpO2:  [97 %-100 %] 97 % (10/30 0720)   Intake/Output: 10/29 0701 - 10/30 0700 In: 60 [P.O.:60] Out: 450 [Urine:450] Last BM Date: 11/01/17  Physical Exam: Constitutional: No acute distress Abdomen: Soft, nondistended, nontender to palpation.  Labs:  Recent Labs    10/31/17 0405 11/01/17 0447  WBC 6.3 7.3  HGB 11.2* 11.4*  HCT 34.5* 36.3*  PLT 233 241   Recent Labs    10/31/17 0405 11/01/17 0447  NA 140 137  K 3.4* 3.8  CL 111 108  CO2 24 24  GLUCOSE 153* 206*  BUN 7* 9  CREATININE 0.81 0.81  CALCIUM 8.9 8.9   No results for input(s): LABPROT, INR in the last 72 hours.  Imaging: Mr Pelvis W Wo Contrast  Result Date: 10/31/2017 CLINICAL DATA:  "Rectal polyp". Colonoscopy demonstrating a rectosigmoid junction mass at approximately 27 cm from the anus. On the order of 2 cm. EXAM: MRI PELVIS WITHOUT AND WITH CONTRAST TECHNIQUE: Multiplanar multisequence MR imaging of the pelvis was performed both before and after administration of intravenous contrast. CONTRAST:  6.5 cc Gadavist COMPARISON:  Colonoscopy report 10/29/2017. Abdominopelvic CT 10/25/2017. FINDINGS: Urinary Tract: Mild right-sided bladder wall irregularity, including on image 22/3 and image 33/11. This is not well evaluated on this nondedicated exam. No hydroureter. Bowel: Endorectal gel was administered, with good rectal distension but no evidence of rectal mass. The distal sigmoid is underdistended with apparent circumferential wall thickening and  suggestion of mild hyperenhancement. Example on images 21/3, 21/9, and 23/11. There is no extracolonic tumor spread. No bowel obstruction at this level. More proximal sigmoid colonic diverticulosis. Residual soft tissue fullness within the cecum, likely due to primary tumor and secondary intussusception. Example image 1/9. Vascular/Lymphatic: No pelvic aneurysm or sidewall adenopathy. Reproductive: Mild median lobe enlargement with impression into the urinary bladder. Findings of benign prostatic hyperplasia, with a dominant right-sided central gland nodule of 11 mm on image 30/3. Partially obscured borders. Other:  No significant free fluid. Musculoskeletal: No acute osseous abnormality. IMPRESSION: 1. No evidence of rectal mass or polyp. 2. Sigmoid underdistention with apparent wall thickening and mild mucosal hyperenhancement. Likely the site of the rectosigmoid primary described on colonoscopy. This is not well evaluated secondary to underdistention. No pericolonic extension or pelvic adenopathy. 3. Subtle right-sided bladder wall irregularity, indeterminate. Possibly an area of urinary debris. A flat urothelial carcinoma is less likely but cannot be excluded. Consider correlation with urinalysis. This may warrant outpatient cystoscopy if indicated. 4. Benign prostatic hyperplasia with a dominant right-sided central gland nodule, partially obscured borders. Favor benign. Consider correlation with PSA and depending on PSA level, possibly outpatient dedicated prostate MR. Electronically Signed   By: Abigail Miyamoto M.D.   On: 10/31/2017 16:42    Assessment/Plan: This is a 77 y.o. male with synchronous cancer of the cecum as well as rectosigmoid area.  --MRI reviewed and there is no evidence of a rectal mass or polyp making this finding of the rectosigmoid junction more of a sigmoid mass instead. -We will proceed  today to the operating room for right colectomy, sigmoidectomy, and diverting loop  ileostomy.   Melvyn Neth, [facility]

## 2017-11-01 NOTE — Progress Notes (Signed)
[name]   DOB:06-01-1940   YI#:[id number]    Subjective: Patient resting comfortably.  States his pain comes and goes.  No nausea no vomiting.  Currently n.p.o.  Objective:  Vitals:   11/01/17 0424 11/01/17 0720  BP: (!) 93/44 (!) 107/57  Pulse: (!) 54 (!) 57  Resp: 19 18  Temp: 97.7 F (36.5 C) 97.6 F (36.4 C)  SpO2: 100% 97%     Intake/Output Summary (Last 24 hours) at 11/01/2017 0848 Last data filed at 10/31/2017 2350 Gross per 24 hour  Intake 60 ml  Output 450 ml  Net -390 ml    Constitutional: He is oriented to person, place, and time and well-developed, well-nourished, and in no distress.  Patient is alone.  HENT:  Head: Normocephalic and atraumatic.  Mouth/Throat: Oropharynx is clear and moist. No oropharyngeal exudate.  Eyes: Pupils are equal, round, and reactive to light.  Neck: Normal range of motion. Neck supple.  Cardiovascular: Normal rate and regular rhythm.  Pulmonary/Chest: No respiratory distress. He has no wheezes.  Abdominal: Soft. Bowel sounds are normal. He exhibits no distension and no mass. There is tenderness. There is no rebound and no guarding.  Abdominal tenderness on deep palpation.  No rigidity or guarding.  Musculoskeletal: Normal range of motion. He exhibits no edema or tenderness.  Neurological: He is alert and oriented to person, place, and time.  Skin: Skin is warm.  Psychiatric: Affect normal.     Labs:  Lab Results  Component Value Date   WBC 7.3 11/01/2017   HGB 11.4 (L) 11/01/2017   HCT 36.3 (L) 11/01/2017   MCV 82.3 11/01/2017   PLT 241 11/01/2017   NEUTROABS 4.3 10/27/2017    Lab Results  Component Value Date   NA 137 11/01/2017   K 3.8 11/01/2017   CL 108 11/01/2017   CO2 24 11/01/2017    Studies:  Mr Pelvis W Wo Contrast  Result Date: 10/31/2017 CLINICAL DATA:  "Rectal polyp". Colonoscopy demonstrating a rectosigmoid junction mass at approximately 27 cm from the anus. On the order of 2 cm. EXAM: MRI  PELVIS WITHOUT AND WITH CONTRAST TECHNIQUE: Multiplanar multisequence MR imaging of the pelvis was performed both before and after administration of intravenous contrast. CONTRAST:  6.5 cc Gadavist COMPARISON:  Colonoscopy report 10/29/2017. Abdominopelvic CT 10/25/2017. FINDINGS: Urinary Tract: Mild right-sided bladder wall irregularity, including on image 22/3 and image 33/11. This is not well evaluated on this nondedicated exam. No hydroureter. Bowel: Endorectal gel was administered, with good rectal distension but no evidence of rectal mass. The distal sigmoid is underdistended with apparent circumferential wall thickening and suggestion of mild hyperenhancement. Example on images 21/3, 21/9, and 23/11. There is no extracolonic tumor spread. No bowel obstruction at this level. More proximal sigmoid colonic diverticulosis. Residual soft tissue fullness within the cecum, likely due to primary tumor and secondary intussusception. Example image 1/9. Vascular/Lymphatic: No pelvic aneurysm or sidewall adenopathy. Reproductive: Mild median lobe enlargement with impression into the urinary bladder. Findings of benign prostatic hyperplasia, with a dominant right-sided central gland nodule of 11 mm on image 30/3. Partially obscured borders. Other:  No significant free fluid. Musculoskeletal: No acute osseous abnormality. IMPRESSION: 1. No evidence of rectal mass or polyp. 2. Sigmoid underdistention with apparent wall thickening and mild mucosal hyperenhancement. Likely the site of the rectosigmoid primary described on colonoscopy. This is not well evaluated secondary to underdistention. No pericolonic extension or pelvic adenopathy. 3. Subtle right-sided bladder wall irregularity, indeterminate. Possibly an area of  urinary debris. A flat urothelial carcinoma is less likely but cannot be excluded. Consider correlation with urinalysis. This may warrant outpatient cystoscopy if indicated. 4. Benign prostatic hyperplasia with  a dominant right-sided central gland nodule, partially obscured borders. Favor benign. Consider correlation with PSA and depending on PSA level, possibly outpatient dedicated prostate MR. Electronically Signed   By: Abigail Miyamoto M.D.   On: 10/31/2017 16:42    Assessment & Plan:   # 77 year old male patient history of CAD diabetes is currently admitted to hospital for ongoing abdominal pain status post colonoscopy-found to have colonic malignancies  #Synchronous primaries x2-biopsy positive for moderately differentiated adenocarcinoma.  #1 large partially obstructing circumferential fungating mass proximal ascending colon #2 nonobstructing sigmoid colon.  Reviewed the MRI of the pelvis-no rectal lesion noted.  # CAD-status post cardiac clearance.  #Diabetes poorly controlled-blood sugars-better controlled.  #Right bladder wall irregularity/prostatomegaly-on pelvic MRI will need further work-up as outpatient post surgery  Recommendations: Awaiting surgery this morning.  Discussed with Dr. Hampton Abbot regarding proceeding with surgery-no role for any upfront chemotherapy or chemoradiation at this time.  Any recommendation for adjuvant therapy based upon final surgery/pathology.  #I again this morning spoke to patient's daughter [name] #[phone]]-regarding the above plan.  Daughter had questions about the logistics of surgery; which I deferred surgical team.  Also discussed with Dr. Brett Albino.  Cammie Sickle, MD 11/01/2017  8:48 AM

## 2017-11-01 NOTE — Progress Notes (Signed)
Inpatient Diabetes Program Recommendations  AACE/ADA: New Consensus Statement on Inpatient Glycemic Control (2015)  Target Ranges:  Prepandial:   less than 140 mg/dL      Peak postprandial:   less than 180 mg/dL (1-2 hours)      Critically ill patients:  140 - 180 mg/dL   Results for [name], [name] (MRN [id number]) as of 11/01/2017 07:28  Ref. Range 10/31/2017 08:19 10/31/2017 11:52 10/31/2017 16:36 10/31/2017 22:18  Glucose-Capillary Latest Ref Range: 70 - 99 mg/dL 142 (H) 183 (H) 267 (H) 135 (H)    Admit Colon Cancer   History: DM2  Home DM Meds: Lantus 38 units QHS        Novolog 20 units TID        Metformin 1000 mg BID       Trulicity 1.5 mg Qweek  Current Orders: Novolog Sensitive Correction Scale/ SSI (0-9 units) TID AC + HS      NPO today for R colectomy, sigmoidectomy, loop ileostomy per surgery.   MD- May consider changing frequency of Novolog SSI to Q4 hours while patient may have prolonged period of NPO status     --Will follow patient during hospitalization--  Wyn Quaker RN, MSN, CDE Diabetes Coordinator Inpatient Glycemic Control Team Team Pager: [phone] (8a-5p)

## 2017-11-01 NOTE — Interval H&P Note (Deleted)
History and Physical Interval Note:  11/01/2017 11:09 AM  [name]  has presented today for surgery, with the diagnosis of Ileo-colonic intussusseption  The various methods of treatment have been discussed with the patient and family. After consideration of risks, benefits and other options for treatment, the patient has consented to  Procedure(s): COLONOSCOPY (N/A) as a surgical intervention .  The patient's history has been reviewed, patient examined, no change in status, stable for surgery.  I have reviewed the patient's chart and labs.  Questions were answered to the patient's satisfaction.     [name]

## 2017-11-01 NOTE — Anesthesia Postprocedure Evaluation (Signed)
Anesthesia Post Note  Patient: [name]  Procedure(s) Performed: RIGHT COLECTOMY, SIGMOID RESECTION, AND SPLENIC FLEXURE MOBILIZATION (Right Abdomen)  Patient location during evaluation: PACU Anesthesia Type: General Level of consciousness: awake and alert Pain management: pain level controlled Vital Signs Assessment: post-procedure vital signs reviewed and stable Respiratory status: spontaneous breathing and respiratory function stable Cardiovascular status: stable Anesthetic complications: no     Last Vitals:  Vitals:   11/01/17 1915 11/01/17 1922  BP:  136/60  Pulse: (!) 53 (!) 55  Resp: 11 11  Temp:    SpO2: 100% 100%    Last Pain:  Vitals:   11/01/17 1922  TempSrc:   PainSc: Asleep                 Divina Neale K

## 2017-11-01 NOTE — Brief Op Note (Signed)
11/01/2017  7:08 PM  PATIENT:  [name]  77 y.o. male  PRE-OPERATIVE DIAGNOSIS:  Adenocarcinoma of cecum and rectosigmoid.  POST-OPERATIVE DIAGNOSIS:  Adenocarcinoma of cecum and rectosigmoid.  PROCEDURE:  Right colectomy, sigmoidectomy, splenic flexure mobilization.  SURGEON:  Surgeon(s) and Role:    * Noal Abshier, MD - Primary    * Nestor Lewandowsky, MD - Assisting  ASSISTANTS: Judye Bos, PA-S   ANESTHESIA:   general  EBL:  250 mL   BLOOD ADMINISTERED:none  DRAINS: none   LOCAL MEDICATIONS USED:  OTHER Exparel  SPECIMEN:  Source of Specimen:  right colon and sigmoid colon  DISPOSITION OF SPECIMEN:  PATHOLOGY  COUNTS:  YES  DICTATION: .Dragon Dictation  PLAN OF CARE: Admit to inpatient   PATIENT DISPOSITION:  PACU - hemodynamically stable.   Delay start of Pharmacological VTE agent (>24hrs) due to surgical blood loss or risk of bleeding: yes

## 2017-11-01 NOTE — Care Management Important Message (Signed)
Copy of signed IM left with patient in room.  

## 2017-11-01 NOTE — Anesthesia Procedure Notes (Signed)
Procedure Name: Intubation Date/Time: 11/01/2017 12:37 PM Performed by: Willette Alma, CRNA Pre-anesthesia Checklist: Patient identified, Patient being monitored, Timeout performed, Emergency Drugs available and Suction available Patient Re-evaluated:Patient Re-evaluated prior to induction Oxygen Delivery Method: Circle system utilized Preoxygenation: Pre-oxygenation with 100% oxygen Induction Type: IV induction Ventilation: Mask ventilation without difficulty Laryngoscope Size: Mac and 3 Grade View: Grade I Tube type: Oral Tube size: 7.0 mm Number of attempts: 1 Airway Equipment and Method: Stylet Placement Confirmation: ETT inserted through vocal cords under direct vision,  positive ETCO2 and breath sounds checked- equal and bilateral Secured at: 21 cm Tube secured with: Tape Dental Injury: Teeth and Oropharynx as per pre-operative assessment

## 2017-11-01 NOTE — Progress Notes (Signed)
SUBJECTIVE: Feeling well. Awaiting surgery today.   Vitals:   10/31/17 1415 10/31/17 2108 11/01/17 0424 11/01/17 0720  BP: (!) 146/60 (!) 115/58 (!) 93/44 (!) 107/57  Pulse: 63 62 (!) 54 (!) 57  Resp:  18 19 18   Temp: 97.8 F (36.6 C) 98.3 F (36.8 C) 97.7 F (36.5 C) 97.6 F (36.4 C)  TempSrc: Oral Oral Oral Oral  SpO2: 100% 98% 100% 97%  Weight:      Height:        Intake/Output Summary (Last 24 hours) at 11/01/2017 0828 Last data filed at 10/31/2017 2350 Gross per 24 hour  Intake 60 ml  Output 450 ml  Net -390 ml    LABS: Basic Metabolic Panel: Recent Labs    10/31/17 0405 11/01/17 0447  NA 140 137  K 3.4* 3.8  CL 111 108  CO2 24 24  GLUCOSE 153* 206*  BUN 7* 9  CREATININE 0.81 0.81  CALCIUM 8.9 8.9   Liver Function Tests: No results for input(s): AST, ALT, ALKPHOS, BILITOT, PROT, ALBUMIN in the last 72 hours. No results for input(s): LIPASE, AMYLASE in the last 72 hours. CBC: Recent Labs    10/31/17 0405 11/01/17 0447  WBC 6.3 7.3  HGB 11.2* 11.4*  HCT 34.5* 36.3*  MCV 80.4 82.3  PLT 233 241   Cardiac Enzymes: No results for input(s): CKTOTAL, CKMB, CKMBINDEX, TROPONINI in the last 72 hours. BNP: Invalid input(s): POCBNP D-Dimer: No results for input(s): DDIMER in the last 72 hours. Hemoglobin A1C: Recent Labs    10/30/17 0533  HGBA1C 9.7*   Fasting Lipid Panel: No results for input(s): CHOL, HDL, LDLCALC, TRIG, CHOLHDL, LDLDIRECT in the last 72 hours. Thyroid Function Tests: No results for input(s): TSH, T4TOTAL, T3FREE, THYROIDAB in the last 72 hours.  Invalid input(s): FREET3 Anemia Panel: No results for input(s): VITAMINB12, FOLATE, FERRITIN, TIBC, IRON, RETICCTPCT in the last 72 hours.   PHYSICAL EXAM General: Pale, thin 77yo male lying in bed.  HEENT:  Normocephalic and atramatic Neck:  No JVD.  Lungs: Clear bilaterally to auscultation and percussion. Heart: HRRR . Normal S1 and S2 without gallops or murmurs.  Abdomen:  Bowel sounds are positive, abdomen soft and non-tender  Msk:  Back normal, normal gait. Normal strength and tone for age. Extremities: No clubbing, cyanosis or edema.   Neuro: Alert and oriented X 3. Psych:  Good affect, responds appropriately  TELEMETRY: Not available, sinus rhythm by ausculation.  ASSESSMENT AND PLAN: History of STEMI and 2 vessel disease s/ap PCI in 2016. Pt requires abdominal surgery. Echo shows normal LVEF and wall mtion, mild diastolic dysfunction and mild MR. Cleared for surgery from cardiac perspective but advise continuing aspirin.   Active Problems:   Abdominal pain   Intussusception ([place])   Protein-calorie malnutrition, severe   Cancer of ascending colon ([place])   Cancer of rectosigmoid (colon) ([place])    Jake Bathe, NP-C 11/01/2017 8:28 AM Cell: [phone]

## 2017-11-01 NOTE — Progress Notes (Signed)
[facility] [place] NAME: [name]    MR#:  [id number]  DATE OF BIRTH:  1940/01/15  SUBJECTIVE:  Doing well this morning.  Anxious about upcoming surgery and hopes that it goes well.  Still having abdominal pain throughout his whole abdomen.  REVIEW OF SYSTEMS:    Constitutional: Negative for fever, chills weight loss HENT: Negative for ear pain, nosebleeds, congestion, facial swelling, rhinorrhea, neck pain, neck stiffness and ear discharge.   Respiratory: Negative for cough, shortness of breath, wheezing  Cardiovascular: Negative for chest pain, palpitations and leg swelling.  Gastrointestinal: Negative for heartburn, no vomiting, diarrhea or constipation. +abdominal pain Genitourinary: Negative for dysuria, urgency, frequency, hematuria Musculoskeletal: Negative for back pain or joint pain Neurological: Negative for dizziness, seizures, syncope, focal weakness,  numbness and headaches.  Hematological: Does not bruise/bleed easily.  Psychiatric/Behavioral: Negative for hallucinations, confusion, dysphoric mood  Tolerating Diet: NPO this morning  DRUG ALLERGIES:  No Known Allergies  VITALS:  Blood pressure (!) 119/56, pulse (!) 57, temperature (!) 96.7 F (35.9 C), temperature source Temporal, resp. rate 18, height 5\' 9"  (1.753 m), weight 64.2 kg, SpO2 97 %.  PHYSICAL EXAMINATION:  Constitutional: Appears well-developed and well-nourished. No distress. HENT: Normocephalic. Atraumatic. Oropharynx is clear and moist.  Eyes: Conjunctivae and EOM are normal. PERRLA, no scleral icterus.  Neck: Normal ROM. Neck supple. No JVD. No tracheal deviation. CVS: RRR, S1/S2 +, no murmurs, no gallops, no carotid bruit.  Pulmonary: Effort and breath sounds normal, no stridor, rhonchi, wheezes, rales.  Abdominal: + Mild to moderate generalized tenderness to palpation, worse in the epigastric and periumbilical areas, no distension, no rebound, no  guarding musculoskeletal: Normal range of motion. No edema and no tenderness.  Neuro: Awake, alert, moving all extremities Skin: Skin is warm and dry. No rash noted. Psychiatric: normal thought content, normal judgment  LABORATORY PANEL:   CBC Recent Labs  Lab 11/01/17 0447  WBC 7.3  HGB 11.4*  HCT 36.3*  PLT 241   ------------------------------------------------------------------------------------------------------------------  Chemistries  Recent Labs  Lab 10/27/17 1415  11/01/17 0447  NA 136   < > 137  K 4.1   < > 3.8  CL 103   < > 108  CO2 26   < > 24  GLUCOSE 349*   < > 206*  BUN 18   < > 9  CREATININE 1.02   < > 0.81  CALCIUM 9.3   < > 8.9  AST 11*  --   --   ALT 8  --   --   ALKPHOS 83  --   --   BILITOT 0.5  --   --    < > = values in this interval not displayed.   ------------------------------------------------------------------------------------------------------------------  Cardiac Enzymes Recent Labs  Lab 10/27/17 1415  TROPONINI <0.03   ------------------------------------------------------------------------------------------------------------------  RADIOLOGY:  Mr Pelvis W Wo Contrast  Result Date: 10/31/2017 CLINICAL DATA:  "Rectal polyp". Colonoscopy demonstrating a rectosigmoid junction mass at approximately 27 cm from the anus. On the order of 2 cm. EXAM: MRI PELVIS WITHOUT AND WITH CONTRAST TECHNIQUE: Multiplanar multisequence MR imaging of the pelvis was performed both before and after administration of intravenous contrast. CONTRAST:  6.5 cc Gadavist COMPARISON:  Colonoscopy report 10/29/2017. Abdominopelvic CT 10/25/2017. FINDINGS: Urinary Tract: Mild right-sided bladder wall irregularity, including on image 22/3 and image 33/11. This is not well evaluated on this nondedicated exam. No hydroureter. Bowel: Endorectal gel was administered, with good rectal distension  but no evidence of rectal mass. The distal sigmoid is underdistended with  apparent circumferential wall thickening and suggestion of mild hyperenhancement. Example on images 21/3, 21/9, and 23/11. There is no extracolonic tumor spread. No bowel obstruction at this level. More proximal sigmoid colonic diverticulosis. Residual soft tissue fullness within the cecum, likely due to primary tumor and secondary intussusception. Example image 1/9. Vascular/Lymphatic: No pelvic aneurysm or sidewall adenopathy. Reproductive: Mild median lobe enlargement with impression into the urinary bladder. Findings of benign prostatic hyperplasia, with a dominant right-sided central gland nodule of 11 mm on image 30/3. Partially obscured borders. Other:  No significant free fluid. Musculoskeletal: No acute osseous abnormality. IMPRESSION: 1. No evidence of rectal mass or polyp. 2. Sigmoid underdistention with apparent wall thickening and mild mucosal hyperenhancement. Likely the site of the rectosigmoid primary described on colonoscopy. This is not well evaluated secondary to underdistention. No pericolonic extension or pelvic adenopathy. 3. Subtle right-sided bladder wall irregularity, indeterminate. Possibly an area of urinary debris. A flat urothelial carcinoma is less likely but cannot be excluded. Consider correlation with urinalysis. This may warrant outpatient cystoscopy if indicated. 4. Benign prostatic hyperplasia with a dominant right-sided central gland nodule, partially obscured borders. Favor benign. Consider correlation with PSA and depending on PSA level, possibly outpatient dedicated prostate MR. Electronically Signed   By: Abigail Miyamoto M.D.   On: 10/31/2017 16:42     ASSESSMENT AND PLAN:   77 year old male with history of CAD and diabetes who presents to the emergency room due to abdominal pain  1.  2 colonic masses-path results with moderately differentiated adenocarcinoma -General surgery following- plan for right colectomy, sigmoidectomy, and loop ileostomy today -Oncology  following  2.  Diabetes: Continue sliding scale and follow blood sugars  3.  BPH: Continue Flomax  4.  Essential hypertension: Having some low blood pressures this morning. -Will give 1 L normal saline bolus prior to surgery -Continue Coreg  5.  CAD: Cleared for surgery by cardiology -Continue to hold Plavix -Continue aspirin, Coreg  6. Iron deficiency anemia and B12 deficiency: Iron deficiency likely due to colon mass.  Hemoglobin stable. -B12 supplement  Management plans discussed with the patient and he is in agreement.  CODE STATUS: Full  TOTAL TIME TAKING CARE OF THIS PATIENT: 32 minutes.   POSSIBLE D/C unknown,  DEPENDING ON CLINICAL CONDITION.   Berna Spare Dietrich Samuelson M.D on 11/01/2017 at 2:29 PM  Between 7am to 6pm - Pager [phone] After 6pm go to [email or web address] - password [email or web address] [facility]  Office  [phone]  CC: Primary care physician; Valera Castle, MD  Note: This dictation was prepared with Dragon dictation along with smaller phrase technology. Any transcriptional errors that result from this process are unintentional.

## 2017-11-01 NOTE — Anesthesia Post-op Follow-up Note (Signed)
Anesthesia QCDR form completed.        

## 2017-11-01 NOTE — Transfer of Care (Signed)
Immediate Anesthesia Transfer of Care Note  Patient: [name]  Procedure(s) Performed: RIGHT  COLON AND SIGMOID (Right Abdomen)  Patient Location: PACU  Anesthesia Type:General  Level of Consciousness: sedated  Airway & Oxygen Therapy: Patient Spontanous Breathing and Patient connected to face mask oxygen  Post-op Assessment: Report given to RN and Post -op Vital signs reviewed and stable  Post vital signs: Reviewed and stable  Last Vitals:  Vitals Value Taken Time  BP 130/57 11/01/2017  7:08 PM  Temp    Pulse 55 11/01/2017  7:09 PM  Resp 12 11/01/2017  7:09 PM  SpO2 100 % 11/01/2017  7:09 PM  Vitals shown include unvalidated device data.  Last Pain:  Vitals:   11/01/17 1141  TempSrc: Temporal  PainSc:          Complications: No apparent anesthesia complications

## 2017-11-01 NOTE — H&P (View-Only) (Signed)
11/01/2017  Subjective: No acute events overnight.  Patient had 2 bottles of magnesium citrate for bowel prep as well as erythromycin and neomycin.  He has had liquid bowel movements.  Denies any worsening abdominal pain reports there is still soreness as previously.  Vital signs: Temp:  [97.6 F (36.4 C)-98.3 F (36.8 C)] 97.6 F (36.4 C) (10/30 0720) Pulse Rate:  [54-63] 57 (10/30 0720) Resp:  [18-19] 18 (10/30 0720) BP: (93-146)/(44-60) 107/57 (10/30 0720) SpO2:  [97 %-100 %] 97 % (10/30 0720)   Intake/Output: 10/29 0701 - 10/30 0700 In: 60 [P.O.:60] Out: 450 [Urine:450] Last BM Date: 11/01/17  Physical Exam: Constitutional: No acute distress Abdomen: Soft, nondistended, nontender to palpation.  Labs:  Recent Labs    10/31/17 0405 11/01/17 0447  WBC 6.3 7.3  HGB 11.2* 11.4*  HCT 34.5* 36.3*  PLT 233 241   Recent Labs    10/31/17 0405 11/01/17 0447  NA 140 137  K 3.4* 3.8  CL 111 108  CO2 24 24  GLUCOSE 153* 206*  BUN 7* 9  CREATININE 0.81 0.81  CALCIUM 8.9 8.9   No results for input(s): LABPROT, INR in the last 72 hours.  Imaging: Mr Pelvis W Wo Contrast  Result Date: 10/31/2017 CLINICAL DATA:  "Rectal polyp". Colonoscopy demonstrating a rectosigmoid junction mass at approximately 27 cm from the anus. On the order of 2 cm. EXAM: MRI PELVIS WITHOUT AND WITH CONTRAST TECHNIQUE: Multiplanar multisequence MR imaging of the pelvis was performed both before and after administration of intravenous contrast. CONTRAST:  6.5 cc Gadavist COMPARISON:  Colonoscopy report 10/29/2017. Abdominopelvic CT 10/25/2017. FINDINGS: Urinary Tract: Mild right-sided bladder wall irregularity, including on image 22/3 and image 33/11. This is not well evaluated on this nondedicated exam. No hydroureter. Bowel: Endorectal gel was administered, with good rectal distension but no evidence of rectal mass. The distal sigmoid is underdistended with apparent circumferential wall thickening and  suggestion of mild hyperenhancement. Example on images 21/3, 21/9, and 23/11. There is no extracolonic tumor spread. No bowel obstruction at this level. More proximal sigmoid colonic diverticulosis. Residual soft tissue fullness within the cecum, likely due to primary tumor and secondary intussusception. Example image 1/9. Vascular/Lymphatic: No pelvic aneurysm or sidewall adenopathy. Reproductive: Mild median lobe enlargement with impression into the urinary bladder. Findings of benign prostatic hyperplasia, with a dominant right-sided central gland nodule of 11 mm on image 30/3. Partially obscured borders. Other:  No significant free fluid. Musculoskeletal: No acute osseous abnormality. IMPRESSION: 1. No evidence of rectal mass or polyp. 2. Sigmoid underdistention with apparent wall thickening and mild mucosal hyperenhancement. Likely the site of the rectosigmoid primary described on colonoscopy. This is not well evaluated secondary to underdistention. No pericolonic extension or pelvic adenopathy. 3. Subtle right-sided bladder wall irregularity, indeterminate. Possibly an area of urinary debris. A flat urothelial carcinoma is less likely but cannot be excluded. Consider correlation with urinalysis. This may warrant outpatient cystoscopy if indicated. 4. Benign prostatic hyperplasia with a dominant right-sided central gland nodule, partially obscured borders. Favor benign. Consider correlation with PSA and depending on PSA level, possibly outpatient dedicated prostate MR. Electronically Signed   By: Abigail Miyamoto M.D.   On: 10/31/2017 16:42    Assessment/Plan: This is a 77 y.o. male with synchronous cancer of the cecum as well as rectosigmoid area.  --MRI reviewed and there is no evidence of a rectal mass or polyp making this finding of the rectosigmoid junction more of a sigmoid mass instead. -We will proceed  today to the operating room for right colectomy, sigmoidectomy, and diverting loop  ileostomy.   Melvyn Neth, [facility]

## 2017-11-02 ENCOUNTER — Encounter: Payer: Self-pay | Admitting: Surgery

## 2017-11-02 DIAGNOSIS — C182 Malignant neoplasm of ascending colon: Secondary | ICD-10-CM

## 2017-11-02 DIAGNOSIS — K561 Intussusception: Secondary | ICD-10-CM

## 2017-11-02 DIAGNOSIS — C19 Malignant neoplasm of rectosigmoid junction: Secondary | ICD-10-CM

## 2017-11-02 LAB — CBC
HCT: 33.8 % — ABNORMAL LOW (ref 39.0–52.0)
Hemoglobin: 10.9 g/dL — ABNORMAL LOW (ref 13.0–17.0)
MCH: 26.4 pg (ref 26.0–34.0)
MCHC: 32.2 g/dL (ref 30.0–36.0)
MCV: 81.8 fL (ref 80.0–100.0)
Platelets: 226 10*3/uL (ref 150–400)
RBC: 4.13 MIL/uL — AB (ref 4.22–5.81)
RDW: 14 % (ref 11.5–15.5)
WBC: 9.5 10*3/uL (ref 4.0–10.5)
nRBC: 0 % (ref 0.0–0.2)

## 2017-11-02 LAB — BASIC METABOLIC PANEL
Anion gap: 9 (ref 5–15)
BUN: 11 mg/dL (ref 8–23)
CO2: 20 mmol/L — ABNORMAL LOW (ref 22–32)
CREATININE: 0.93 mg/dL (ref 0.61–1.24)
Calcium: 8.2 mg/dL — ABNORMAL LOW (ref 8.9–10.3)
Chloride: 107 mmol/L (ref 98–111)
GFR calc Af Amer: >60 mL/min (ref >60)
Glucose, Bld: 240 mg/dL — ABNORMAL HIGH (ref 70–99)
POTASSIUM: 4 mmol/L (ref 3.5–5.1)
SODIUM: 136 mmol/L (ref 135–145)

## 2017-11-02 LAB — GLUCOSE, CAPILLARY
GLUCOSE-CAPILLARY: 182 mg/dL — AB (ref 70–99)
GLUCOSE-CAPILLARY: 219 mg/dL — AB (ref 70–99)
GLUCOSE-CAPILLARY: 223 mg/dL — AB (ref 70–99)
GLUCOSE-CAPILLARY: 227 mg/dL — AB (ref 70–99)
Glucose-Capillary: 220 mg/dL — ABNORMAL HIGH (ref 70–99)
Glucose-Capillary: 216 mg/dL — ABNORMAL HIGH (ref 70–99)

## 2017-11-02 MED ORDER — ASPIRIN EC 81 MG PO TBEC
81.0000 mg | DELAYED_RELEASE_TABLET | Freq: Every day | ORAL | Status: DC
  Administered 2017-11-03 – 2017-11-06 (×4): 81 mg via ORAL
  Filled 2017-11-02 (×4): qty 1

## 2017-11-02 MED ORDER — INSULIN GLARGINE 100 UNIT/ML ~~LOC~~ SOLN
12.0000 [IU] | Freq: Every day | SUBCUTANEOUS | Status: DC
  Administered 2017-11-02 – 2017-11-04 (×3): 12 [IU] via SUBCUTANEOUS
  Filled 2017-11-02 (×4): qty 0.12

## 2017-11-02 NOTE — Progress Notes (Signed)
Nutrition Follow Up Note   DOCUMENTATION CODES:   Severe malnutrition in context of chronic illness  INTERVENTION:   RD will monitor for diet advancement vs the need for nutrition support  Recommend Ensure Enlive po BID when diet advanced. Pt likely at high refeed risk; recommend monitor K, Mg and P labs after diet advancement  Consider TPN if unable to advance diet in 1-2 days as pt without adequate nutrition for 7 days.   NUTRITION DIAGNOSIS:   Severe Malnutrition related to chronic illness(5 month history of anorexia, early satiety, abdominal pain, N/V, two lesions in colon concerning for malignancy) as evidenced by severe fat depletion, moderate muscle depletion, severe muscle depletion.  GOAL:   Patient will meet greater than or equal to 90% of their needs  -not met  MONITOR:   Diet advancement, Labs, Weight trends, Skin, I & O's  ASSESSMENT:   77 year old male with PMHx of DM, CAD, hx STEMI s/p PCI 10/2014, HTN, HLD, BPH, OA who admitted with right lower quadrant pain and CT scan concerning for ileocolic intussusception.  He had a colonoscopy to evaluate the etiology and was found to have adenocarcinoma of the cecum and the sigmoid colon. Pt now s/p Right colectomy, sigmoidectomy, and splenic flexure mobilization 10/30   Pt NPO today s/p colectomy and sigmoidectomy. Pt has been on NPO/liquid diet since admit and now without adequate nutrition for 7 days. Pt was eating 100% of his full liquid diet on 10/27 prior to NPO. No flatus or BM today. If unable to begin advancing pt's diet in the next 1-2 days consider TPN. Recommend oral nutrition supplements with diet advancement. Pt likely at high refeeding risk; recommend monitor K, Mg and P when diet advances. No new weight since 10/27; will request new weight.   Medications reviewed and include: insulin, MVI, B-12, LRS _0 /hr, hydromorphone   Labs reviewed: K 4.0 wnl Hgb 10.9(L), Hct 33.8(L) cbgs- 219, 220, 227 x 24  hrs  Diet Order:   Diet Order            Diet NPO time specified Except for: Ice Chips, Sips with Meds  Diet effective now             EDUCATION NEEDS:   Education needs have been addressed  Skin:  Skin Assessment: Reviewed RN Assessment  Last BM:  10/30- type 7  Height:   Ht Readings from Last 1 Encounters:  11/01/17 _1  (1.753 m)    Weight:   Wt Readings from Last 1 Encounters:  10/29/17 64.2 kg    Ideal Body Weight:  72.7 kg  BMI:  Body mass index is 20.9 kg/m.  Estimated Nutritional Needs:   Kcal:  1785-2060 (MSJ x 1.3-1.5)  Protein:  90-105 grams (1.4-1.6 grams/kg)  Fluid:  1.8-2 L/day (1 mL/kcal)  Koleen Distance MS, RD, LDN Pager #- [phone] Office#- [phone] After Hours Pager: [phone]

## 2017-11-02 NOTE — Progress Notes (Signed)
[facility] Progress Note  1 Day Post-Op  Subjective: [name] is 1 day s/p right colectomy and sigmoidectomy secondary to adenocarcinoma. He notes that he is feeling better this morning with some incisional soreness mainly. No complaints of fever, chills, nausea, or emesis. No reports of flatus, no BM. Has not been mobilizing. Although NPO, he did has a jelly cup yesterday,   Objective: Vital signs in last 24 hours: Temp:  [96.6 F (35.9 C)-97.8 F (36.6 C)] 97.6 F (36.4 C) (10/31 0427) Pulse Rate:  [53-82] 76 (10/31 0812) Resp:  [10-18] 18 (10/31 0427) BP: (96-176)/(53-82) 96/53 (10/31 0812) SpO2:  [96 %-100 %] 97 % (10/31 0427) Last BM Date: 11/01/17  Intake/Output from previous day: 10/30 0701 - 10/31 0700 In: 3300 [I.V.:3300] Out: 1420 [Urine:1370; Blood:50] Intake/Output this shift: Total I/O In: 1145 [I.V.:1145] Out: -   PE: Gen:  Alert, NAD, pleasant Pulm:  Normal effort Abd: Soft, midline incisional tenderness, non-distended, midline laparotomy incision is C/D/I and without erythema or drainage, honeycomb dressing in place Skin: warm and dry, no rashes  Psych: A&Ox3   Lab Results:  Recent Labs    11/01/17 0447 11/02/17 0355  WBC 7.3 9.5  HGB 11.4* 10.9*  HCT 36.3* 33.8*  PLT 241 226   BMET Recent Labs    11/01/17 0447 11/02/17 0355  NA 137 136  K 3.8 4.0  CL 108 107  CO2 24 20*  GLUCOSE 206* 240*  BUN 9 11  CREATININE 0.81 0.93  CALCIUM 8.9 8.2*   PT/INR No results for input(s): LABPROT, INR in the last 72 hours. CMP     Component Value Date/Time   NA 136 11/02/2017 0355   NA 135 (L) 05/14/2013 1615   K 4.0 11/02/2017 0355   K 3.7 05/14/2013 1615   CL 107 11/02/2017 0355   CL 100 05/14/2013 1615   CO2 20 (L) 11/02/2017 0355   CO2 29 05/14/2013 1615   GLUCOSE 240 (H) 11/02/2017 0355   GLUCOSE 263 (H) 05/14/2013 1615   BUN 11 11/02/2017 0355   BUN 10 05/14/2013 1615   CREATININE 0.93 11/02/2017 0355   CREATININE 1.16 05/14/2013 1615   CALCIUM 8.2 (L) 11/02/2017 0355   CALCIUM 9.3 05/14/2013 1615   PROT 7.0 10/27/2017 1415   PROT 6.5 11/25/2014 0911   PROT 7.8 05/14/2013 1615   ALBUMIN 3.5 10/27/2017 1415   ALBUMIN 4.2 11/25/2014 0911   ALBUMIN 3.5 05/14/2013 1615   AST 11 (L) 10/27/2017 1415   AST 20 05/14/2013 1615   ALT 8 10/27/2017 1415   ALT 14 05/14/2013 1615   ALKPHOS 83 10/27/2017 1415   ALKPHOS 224 (H) 05/14/2013 1615   BILITOT 0.5 10/27/2017 1415   BILITOT 0.7 11/25/2014 0911   BILITOT 0.5 05/14/2013 1615   GFRNONAA >60 11/02/2017 0355   GFRNONAA >60 05/14/2013 1615   GFRAA >60 11/02/2017 0355   GFRAA >60 05/14/2013 1615   Lipase     Component Value Date/Time   LIPASE 23 10/27/2017 1415       Studies/Results: Mr Pelvis W Wo Contrast  Result Date: 10/31/2017 CLINICAL DATA:  "Rectal polyp". Colonoscopy demonstrating a rectosigmoid junction mass at approximately 27 cm from the anus. On the order of 2 cm. EXAM: MRI PELVIS WITHOUT AND WITH CONTRAST TECHNIQUE: Multiplanar multisequence MR imaging of the pelvis was performed both before and after administration of intravenous contrast. CONTRAST:  6.5 cc Gadavist COMPARISON:  Colonoscopy report 10/29/2017. Abdominopelvic CT 10/25/2017. FINDINGS: Urinary Tract: Mild  right-sided bladder wall irregularity, including on image 22/3 and image 33/11. This is not well evaluated on this nondedicated exam. No hydroureter. Bowel: Endorectal gel was administered, with good rectal distension but no evidence of rectal mass. The distal sigmoid is underdistended with apparent circumferential wall thickening and suggestion of mild hyperenhancement. Example on images 21/3, 21/9, and 23/11. There is no extracolonic tumor spread. No bowel obstruction at this level. More proximal sigmoid colonic diverticulosis. Residual soft tissue fullness within the cecum, likely due to primary tumor and secondary intussusception. Example image 1/9.  Vascular/Lymphatic: No pelvic aneurysm or sidewall adenopathy. Reproductive: Mild median lobe enlargement with impression into the urinary bladder. Findings of benign prostatic hyperplasia, with a dominant right-sided central gland nodule of 11 mm on image 30/3. Partially obscured borders. Other:  No significant free fluid. Musculoskeletal: No acute osseous abnormality. IMPRESSION: 1. No evidence of rectal mass or polyp. 2. Sigmoid underdistention with apparent wall thickening and mild mucosal hyperenhancement. Likely the site of the rectosigmoid primary described on colonoscopy. This is not well evaluated secondary to underdistention. No pericolonic extension or pelvic adenopathy. 3. Subtle right-sided bladder wall irregularity, indeterminate. Possibly an area of urinary debris. A flat urothelial carcinoma is less likely but cannot be excluded. Consider correlation with urinalysis. This may warrant outpatient cystoscopy if indicated. 4. Benign prostatic hyperplasia with a dominant right-sided central gland nodule, partially obscured borders. Favor benign. Consider correlation with PSA and depending on PSA level, possibly outpatient dedicated prostate MR. Electronically Signed   By: Abigail Miyamoto M.D.   On: 10/31/2017 16:42    Anti-infectives: Anti-infectives (From admission, onward)   Start     Dose/Rate Route Frequency Ordered Stop   11/01/17 0800  cefoTEtan (CEFOTAN) 2 g in sodium chloride 0.9 % 100 mL IVPB     2 g 200 mL/hr over 30 Minutes Intravenous On call to O.R. 11/01/17 0759 11/01/17 1242   10/31/17 1200  erythromycin (ERY-TAB) EC tablet 1,000 mg     1,000 mg Oral Every 6 hours 10/31/17 1122 11/01/17 0004   10/31/17 1200  neomycin (MYCIFRADIN) tablet 1,000 mg     1,000 mg Oral Every 6 hours 10/31/17 1122 11/01/17 0003       Assessment/Plan  [name] is a 77 y.o. male who is 1 day s/p right colectomy and sigmoidectomy secondary to adenocarcinoma which is complicated by pertinent  co-morbidities including CAD, DM, HTN, and HLD.   - Remain NPO except for ice chips/sips with medicine for today. - Would like to slowly advance diet starting with clears tomorrow - Continue IVF - Monitor abdominal exam and ongoing bowel function - Pain control (minimize narcotics), anti-emetics as needed - Mobilize, will consult PT - DVT Prophylaxis - Medicine following given multiple chronic medical conditions, appreciate their input    LOS: 6 days    Edison Simon , PA-C [facility] 11/02/2017, 9:52 AM [phone] M-F: 7am - 4pm

## 2017-11-02 NOTE — Evaluation (Signed)
Physical Therapy Evaluation Patient Details Name: [name] MRN: [id number] DOB: 1940-03-31 Today's Date: 11/02/2017   History of Present Illness  Pt is a 77 y.o. male with a known history of BPH, essential hypertension, diabetes, history of coronary artery disease, hyperlipidemia, and osteoarthritis who presented to the hospital complaining of abdominal pain.  Patient's abdominal pain has been ongoing now for the past few months.  CT done as an outpatient which showed intussusception in the ileocolic area.  Procedure revealed tumor in the colon and pt diagnosed with adenocarcinoma of cecum and rectosigmoid and is now s/p right colectomy and sigmoidectomy.  Assessment also includes CVA per patient around 4-5 months ago, DM, HTN, CAD, iron and B12 deficiency, and protein calorie malnutrition.    Clinical Impression  Pt presents with deficits in strength, transfers, mobility, gait, balance, and activity tolerance.  Pt required extensive assistance with sidelying to/from sit during log roll technique training.  Pt required min A to stand from an elevated EOB and was grossly unsteady requiring heavy use of BUEs to prevent LOB.  Pt was only able to take several very small steps with a wide BOS and a RW near the EOB before requiring to return to sitting and was unable to attempt to ambulate a second time.  Overall pt presents with significant deficits in functional mobility compared to his baseline level and has limited assistance available at home putting the pt at an elevated risk for falls and for further functional decline.  Pt will benefit from PT services in a SNF setting upon discharge to safely address above deficits for decreased caregiver assistance and eventual return to PLOF.      Follow Up Recommendations SNF    Equipment Recommendations  None recommended by PT    Recommendations for Other Services       Precautions / Restrictions Precautions Precautions: Fall Precaution  Comments: Abdominal incision Restrictions Weight Bearing Restrictions: No      Mobility  Bed Mobility Overal bed mobility: Needs Assistance Bed Mobility: Sit to Sidelying;Sidelying to Sit   Sidelying to sit: Mod assist     Sit to sidelying: Mod assist General bed mobility comments: Log roll technique training/practice with mod A for BLE in and out of bed and for trunk to full upright position  Transfers Overall transfer level: Needs assistance Equipment used: Rolling walker (2 wheeled) Transfers: Sit to/from Stand Sit to Stand: From elevated surface;Min assist         General transfer comment: Mod verbal cues for sequencing  Ambulation/Gait Ambulation/Gait assistance: Min guard Gait Distance (Feet): 2 Feet Assistive device: Rolling walker (2 wheeled) Gait Pattern/deviations: Step-to pattern;Trunk flexed;Wide base of support;Decreased step length - right;Decreased step length - left Gait velocity: Decreased   General Gait Details: Pt grossly unsteady in standing but was able to self-correct with heavy use of BUEs on the RW while taken several small steps at the EOB with a wide BOS and short B step length  Stairs            Wheelchair Mobility    Modified Rankin (Stroke Patients Only)       Balance Overall balance assessment: Needs assistance Sitting-balance support: Feet supported;Bilateral upper extremity supported Sitting balance-Leahy Scale: Good     Standing balance support: Bilateral upper extremity supported Standing balance-Leahy Scale: Poor Standing balance comment: Heavy use of BUEs to prevent LOB in standing  Pertinent Vitals/Pain Pain Assessment: 0-10 Pain Score: 8  Pain Location: abdomen  Pain Descriptors / Indicators: Operative site guarding;Sore Pain Intervention(s): Monitored during session    Home Living Family/patient expects to be discharged to:: Private residence Living Arrangements:  Alone Available Help at Discharge: Family;Available PRN/intermittently Type of Home: Mobile home Home Access: Ramped entrance     Home Layout: One level Home Equipment: Cane - single point;Cane - quad;Walker - 2 wheels      Prior Function Level of Independence: Independent with assistive device(s)         Comments: Mod Ind amb with a SPC, Ind with ADLs and IADLs, no recent fall history     Hand Dominance   Dominant Hand: Right    Extremity/Trunk Assessment   Upper Extremity Assessment Upper Extremity Assessment: Overall WFL for tasks assessed    Lower Extremity Assessment Lower Extremity Assessment: Generalized weakness       Communication   Communication: HOH  Cognition Arousal/Alertness: Awake/alert Behavior During Therapy: WFL for tasks assessed/performed Overall Cognitive Status: Within Functional Limits for tasks assessed                                        General Comments      Exercises Total Joint Exercises Ankle Circles/Pumps: Strengthening;Both;10 reps Quad Sets: Strengthening;Both;10 reps Gluteal Sets: Strengthening;Both;10 reps Heel Slides: AAROM;AROM;Both;5 reps Hip ABduction/ADduction: AROM;AAROM;Both;5 reps Long Arc Quad: AROM;Both;10 reps Knee Flexion: AROM;Both;10 reps Marching in Standing: AROM;Both;10 reps Other Exercises Other Exercises: Log roll technique training    Assessment/Plan    PT Assessment Patient needs continued PT services  PT Problem List Decreased strength;Decreased activity tolerance;Decreased balance;Decreased knowledge of use of DME;Decreased mobility       PT Treatment Interventions DME instruction;Gait training;Functional mobility training;Balance training;Therapeutic exercise;Therapeutic activities;Patient/family education    PT Goals (Current goals can be found in the Care Plan section)  Acute Rehab PT Goals Patient Stated Goal: To get stronger and walk better PT Goal Formulation: With  patient Time For Goal Achievement: 11/15/17 Potential to Achieve Goals: Good    Frequency Min 2X/week   Barriers to discharge Inaccessible home environment;Decreased caregiver support      Co-evaluation               AM-PAC PT "6 Clicks" Daily Activity  Outcome Measure Difficulty turning over in bed (including adjusting bedclothes, sheets and blankets)?: Unable Difficulty moving from lying on back to sitting on the side of the bed? : Unable Difficulty sitting down on and standing up from a chair with arms (e.g., wheelchair, bedside commode, etc,.)?: Unable Help needed moving to and from a bed to chair (including a wheelchair)?: A Little Help needed walking in hospital room?: Total Help needed climbing 3-5 steps with a railing? : Total 6 Click Score: 8    End of Session Equipment Utilized During Treatment: Gait belt Activity Tolerance: Patient limited by fatigue Patient left: in bed;with bed alarm set;with call bell/phone within reach;with SCD's reapplied Nurse Communication: Mobility status PT Visit Diagnosis: Unsteadiness on feet (R26.81);Muscle weakness (generalized) (M62.81);Difficulty in walking, not elsewhere classified (R26.2)    Time: 8786-7672 PT Time Calculation (min) (ACUTE ONLY): 32 min   Charges:   PT Evaluation $PT Eval Low Complexity: 1 Low PT Treatments $Therapeutic Exercise: 8-22 mins        D. Scott Shulem Mader PT, DPT 11/02/17, 11:44 AM

## 2017-11-02 NOTE — Clinical Social Work Note (Signed)
Clinical Social Work Assessment  Patient Details  Name: [name] MRN: [id number] Date of Birth: 11/28/1940  Date of referral:  11/02/17               Reason for consult:  Discharge Planning                Permission sought to share information with:    Permission granted to share information::     Name::        Agency::     Relationship::     Contact Information:     Housing/Transportation Living arrangements for the past 2 months:  Single Family Home Source of Information:  Patient Patient Interpreter Needed:  None Criminal Activity/Legal Involvement Pertinent to Current Situation/Hospitalization:  No - Comment as needed Significant Relationships:  Adult Children Lives with:  Self Do you feel safe going back to the place where you live?  Yes Need for family participation in patient care:  Yes (Comment)  Care giving concerns:  Patient resides alone at home.   Social Worker assessment / plan:  CSW has noted PT is recommending short term rehab. CSW spoke with patient at bedside this afternoon. Patient acknowledges that he was unable to much of anything with PT today. He states that he has had nothing but jello to eat in 3 days and is weak. He states he will be fine once he begins eating again. He declines rehab at this time. He is in agreement for home health when time. Patient reports that his daughter stops by everyday to see him.   Employment status:    Insurance informationEducational psychologist PT Recommendations:  [place] / Referral to community resources:     Patient/Family's Response to care:  Patient expressed appreciation for CSW visit.   Patient/Family's Understanding of and Emotional Response to Diagnosis, Current Treatment, and Prognosis:  Patient states he is tired of being sick and wants to feel better. CSW provided supportive listening.   Emotional Assessment Appearance:  Appears stated age Attitude/Demeanor/Rapport:  (pleasant  and cooperative) Affect (typically observed):  Calm, Flat Orientation:  Oriented to Self, Oriented to Place, Oriented to  Time, Oriented to Situation Alcohol / Substance use:  Not Applicable Psych involvement (Current and /or in the community):  No (Comment)  Discharge Needs  Concerns to be addressed:  Care Coordination Readmission within the last 30 days:  No Current discharge risk:  None Barriers to Discharge:  No Barriers Identified   Shela Leff, LCSW 11/02/2017, 1:52 PM

## 2017-11-02 NOTE — Progress Notes (Signed)
RN Elita Quick notified Dr. Hampton Abbot pt had a clear liquid diet order last night. RN from last night got a clears liquids diet order from the hospitalist on call. Per Dr. Hampton Abbot pt needs to be NPO. He will change the diet order. Will continue to assess and monitor pt.

## 2017-11-02 NOTE — Progress Notes (Addendum)
[facility] NAME: [name]    MR#:  [id number]  DATE OF BIRTH:  02/26/1940  SUBJECTIVE:  Endorsing some mild central abdominal pain today.  States that he is hungry and wants to eat.  No nausea, no vomiting.  REVIEW OF SYSTEMS:    Constitutional: Negative for fever, chills weight loss HENT: Negative for ear pain, nosebleeds, congestion, facial swelling, rhinorrhea, neck pain, neck stiffness and ear discharge.   Respiratory: Negative for cough, shortness of breath, wheezing  Cardiovascular: Negative for chest pain, palpitations and leg swelling.  Gastrointestinal: Negative for heartburn, no vomiting, diarrhea or constipation. +abdominal pain Genitourinary: Negative for dysuria, urgency, frequency, hematuria Musculoskeletal: Negative for back pain or joint pain Neurological: Negative for dizziness, seizures, syncope, focal weakness,  numbness and headaches.  Hematological: Does not bruise/bleed easily.  Psychiatric/Behavioral: Negative for hallucinations, confusion, dysphoric mood  Tolerating Diet: NPO   DRUG ALLERGIES:  No Known Allergies  VITALS:  Blood pressure (!) 102/49, pulse 63, temperature 97.7 F (36.5 C), temperature source Oral, resp. rate 16, height 5\' 9"  (1.753 m), weight 64.2 kg, SpO2 98 %.  PHYSICAL EXAMINATION:  Constitutional: Appears well-developed and well-nourished. No distress. HENT: Normocephalic. Atraumatic. Oropharynx is clear and moist.  Eyes: Conjunctivae and EOM are normal. PERRLA, no scleral icterus.  Neck: Normal ROM. Neck supple. No JVD. No tracheal deviation. CVS: RRR, S1/S2 +, no murmurs, no gallops, no carotid bruit.  Pulmonary: Effort and breath sounds normal, no stridor, rhonchi, wheezes, rales.  Abdominal: + Midline abdominal surgical incision present with dry bandage over top, no surrounding erythema, no drainage. + Mild diffuse tenderness to palpation. No distension, no rebound, no  guarding Musculoskeletal: Normal range of motion. No edema and no tenderness.  Neuro: Awake, alert, moving all extremities Skin: Skin is warm and dry. No rash noted. Psychiatric: normal thought content, normal judgment  LABORATORY PANEL:   CBC Recent Labs  Lab 11/02/17 0355  WBC 9.5  HGB 10.9*  HCT 33.8*  PLT 226   ------------------------------------------------------------------------------------------------------------------  Chemistries  Recent Labs  Lab 10/27/17 1415  11/02/17 0355  NA 136   < > 136  K 4.1   < > 4.0  CL 103   < > 107  CO2 26   < > 20*  GLUCOSE 349*   < > 240*  BUN 18   < > 11  CREATININE 1.02   < > 0.93  CALCIUM 9.3   < > 8.2*  AST 11*  --   --   ALT 8  --   --   ALKPHOS 83  --   --   BILITOT 0.5  --   --    < > = values in this interval not displayed.   ------------------------------------------------------------------------------------------------------------------  Cardiac Enzymes Recent Labs  Lab 10/27/17 1415  TROPONINI <0.03   ------------------------------------------------------------------------------------------------------------------  RADIOLOGY:  No results found.   ASSESSMENT AND PLAN:   77 year old male with history of CAD and diabetes who presents to the emergency room due to abdominal pain  1.  Adenocarcinoma of cecum and rectosigmoid- s/p right colectomy, sigmoidectomy, and splenic flexure mobilization on 11/01/17 -General surgery following-keep n.p.o. for today with plans to start clears tomorrow -Oncology following  2.  Type 2 diabetes- blood sugars high after surgery -Add Lantus 12 units nightly  -Continue sliding scale and follow blood sugars  3.  BPH- Continue Flomax  4.  Essential hypertension- BPs soft this morning -Continue IV fluids -Continue Coreg  5.  CAD- Cleared for surgery by cardiology -Continue to hold Plavix -Continue aspirin and coreg  6. Iron deficiency anemia and B12 deficiency- Iron  deficiency likely due to colon mass.  Hemoglobin stable. -B12 supplementation  Management plans discussed with the patient and he is in agreement.  CODE STATUS: Full  TOTAL TIME TAKING CARE OF THIS PATIENT: 35 minutes.   POSSIBLE D/C 3-4 days,  DEPENDING ON CLINICAL CONDITION.   Berna Spare Kaesyn Johnston M.D on 11/02/2017 at 2:05 PM  Between 7am to 6pm - Pager [phone] After 6pm go to [email or web address] - password [email or web address] [facility]  Office  [phone]  CC: Primary care physician; Valera Castle, MD  Note: This dictation was prepared with Dragon dictation along with smaller phrase technology. Any transcriptional errors that result from this process are unintentional.

## 2017-11-02 NOTE — Progress Notes (Signed)
Inpatient Diabetes Program Recommendations  AACE/ADA: New Consensus Statement on Inpatient Glycemic Control (2015)  Target Ranges:  Prepandial:   less than 140 mg/dL      Peak postprandial:   less than 180 mg/dL (1-2 hours)      Critically ill patients:  140 - 180 mg/dL   Results for [name], [name] (MRN [id number]) as of 11/02/2017 08:00  Ref. Range 11/01/2017 07:50 11/01/2017 11:42 11/01/2017 18:10 11/01/2017 19:13 11/01/2017 20:41 11/01/2017 21:48  Glucose-Capillary Latest Ref Range: 70 - 99 mg/dL 159 (H)  2 units NOVOLOG  94 185 (H)  10 mg Decadron X 1 dose 223 (H)  3 units NOVOLOG  205 (H) 240 (H)   Results for [name], [name] (MRN [id number]) as of 11/02/2017 08:00  Ref. Range 11/02/2017 00:52 11/02/2017 04:26  Glucose-Capillary Latest Ref Range: 70 - 99 mg/dL 219 (H)  3 units NOVOLOG  220 (H)  3 units NOVOLOG    Admit Colon Cancer   History: DM2  Home DM Meds: Lantus 38 units QHS                              Novolog 20 units TID                              Metformin 1000 mg BID                             Trulicity 1.5 mg Qweek  Current Orders: Novolog Sensitive Correction Scale/ SSI (0-9 units) Q4 hours      Note patient received 10 mg Decadron X 1 dose yesterday around 6pm for surgery.  Patient takes Lantus and Novolog at home.  Currently NPO.   MD- Please consider the following in-hospital insulin adjustments:  1. Continue Novolog SSI Q4 hours  2. Start a portion of patient's home Lantus- Recommend Lantus 12 units QHS to start (1/3 total home dose)    --Will follow patient during hospitalization--  Wyn Quaker RN, MSN, CDE Diabetes Coordinator Inpatient Glycemic Control Team Team Pager: [phone] (8a-5p)

## 2017-11-02 NOTE — Progress Notes (Addendum)
SUBJECTIVE: Pt is feeling well. No chest pain or shortness of breath. Has mild abdominal discomfort s/p GI surgery.    Vitals:   11/01/17 2304 11/02/17 0055 11/02/17 0427 11/02/17 0812  BP: 132/77 113/62 (!) 109/53 (!) 96/53  Pulse: 70 81 82 76  Resp: 14 18 18    Temp: (!) 97.5 F (36.4 C) 97.8 F (36.6 C) 97.6 F (36.4 C)   TempSrc: Oral Oral Oral   SpO2: 97% 96% 97%   Weight:      Height:        Intake/Output Summary (Last 24 hours) at 11/02/2017 0909 Last data filed at 11/02/2017 0541 Gross per 24 hour  Intake 3300 ml  Output 1420 ml  Net 1880 ml    LABS: Basic Metabolic Panel: Recent Labs    11/01/17 0447 11/02/17 0355  NA 137 136  K 3.8 4.0  CL 108 107  CO2 24 20*  GLUCOSE 206* 240*  BUN 9 11  CREATININE 0.81 0.93  CALCIUM 8.9 8.2*   Liver Function Tests: No results for input(s): AST, ALT, ALKPHOS, BILITOT, PROT, ALBUMIN in the last 72 hours. No results for input(s): LIPASE, AMYLASE in the last 72 hours. CBC: Recent Labs    11/01/17 0447 11/02/17 0355  WBC 7.3 9.5  HGB 11.4* 10.9*  HCT 36.3* 33.8*  MCV 82.3 81.8  PLT 241 226   Cardiac Enzymes: No results for input(s): CKTOTAL, CKMB, CKMBINDEX, TROPONINI in the last 72 hours. BNP: Invalid input(s): POCBNP D-Dimer: No results for input(s): DDIMER in the last 72 hours. Hemoglobin A1C: No results for input(s): HGBA1C in the last 72 hours. Fasting Lipid Panel: No results for input(s): CHOL, HDL, LDLCALC, TRIG, CHOLHDL, LDLDIRECT in the last 72 hours. Thyroid Function Tests: No results for input(s): TSH, T4TOTAL, T3FREE, THYROIDAB in the last 72 hours.  Invalid input(s): FREET3 Anemia Panel: No results for input(s): VITAMINB12, FOLATE, FERRITIN, TIBC, IRON, RETICCTPCT in the last 72 hours.   PHYSICAL EXAM General: Pale, thin 77yo male lying in bed.  HEENT:  Normocephalic and atramatic Neck:   No JVD.  Lungs: Clear bilaterally to auscultation and percussion. Heart: HRRR . Normal S1 and S2  without gallops or murmurs.  Abdomen: Bowel sounds are positive, abdomen soft and non-tender  Msk:  Back normal, normal gait. Normal strength and tone for age. Extremities: No clubbing, cyanosis or edema.   Neuro: Alert and oriented X 3. Psych:  Good affect, responds appropriately  TELEMETRY: Not available, NSR by auscultations  ASSESSMENT AND PLAN: History of STEMI and 2 vessel disease s/p PCI in 2016. Pt tolerated GI surgery without cardiac complications. Echo shows normal LVEF and wall mtion, mild diastolic dysfunction and mildMR. Continuing aspirin. Will sign off for now, please call if any further concerns. Advise routine cardiac follow up with Dr. Humphrey Rolls at [facility] after discharge.   Active Problems:   Abdominal pain   Intussusception ([place])   Protein-calorie malnutrition, severe   Cancer of ascending colon ([place])   Cancer of rectosigmoid (colon) ([place])    Jake Bathe, NP-C 11/02/2017 9:09 AM Cell: [phone]

## 2017-11-02 NOTE — Op Note (Signed)
Procedure Date:  11/02/2017  Pre-operative Diagnosis:  Adenocarcinoma of the cecum and rectosigmoid  Post-operative Diagnosis:  Adenocarcinoma of the cecum and rectosigmoid  Procedure:  Right colectomy, sigmoidectomy, and splenic flexure mobilization  Surgeon:  Melvyn Neth, MD  Assistant:  Nestor Lewandowsky, MD.  His assistance was critical due to the complexity of the case, as this was an open abdomen case in a patient that would require two anastomoses.    Anesthesia:  General endotracheal  Estimated Blood Loss:  250 ml  Specimens:  Right colon, sigmoid colon, anastomotic donuts  Complications:  None  Indications for Procedure:  This is a 77 y.o. male admitted with right lower quadrant pain and CT scan concerning for ileocolic intussusception.  He had a colonoscopy to evaluate the etiology and was found to have adenocarcinoma of the cecum and the sigmoid colon.  The risks of bleeding, abscess or infection, injury to surrounding structures, and need for further procedures were all discussed with the patient and he was willing to proceed.  Description of Procedure: The patient was correctly identified in the preoperative area and brought into the operating room.  The patient was placed supine with VTE prophylaxis in place.  Appropriate time-outs were performed.  Anesthesia was induced and the patient was intubated.  Foley catheter was placed.  Appropriate antibiotics were infused.  The patient was then placed in lithotomy position.  The abdomen was prepped and draped in a sterile fashion.  A midline incision was made and electrocautery was used to dissect down the subcutaneous tissue to the fascia.  The fascia was incised and extended superiorly and inferiorly.  The abdomen was explored and there was no evidence of metastatic disease.  The cecal mass and rectosigmoid mass were both identified and palpated.  Bookwalter retractor was placed.  We proceeded first with mobilization of the  right colon.  There was significant scarring due to the patient's prior appendectomy and cholecystectomy.  The tissue planes were fused together making the dissection more difficult.  We did encounter oozing from raw surfaces during this dissection.  The omentum was also adhered to the gallbladder fossa and there was some bleeding while taking this down.  As the right colon was mobilized from lateral to medial fashion, the kidney was visualized, the duodenum was visualized, and the gonadal vessels were visualized, but the ureter was not.  It was not along with the colon mobilized though and did not appear to be with specimen.  Mobilization continued to the hepatic flexure and proximal transverse colon.  Windows in the mesentery were created of the terminal ileum and proximal transverse colon and after dissecting the omentum off the proximal transverse colon, two blue 75 mm loads of GIA stapler were used to transect the terminal ileum and transverse colon.  Then the LigaSure was used to dissect through the mesentery, taking the right branch of the middle colic, the right colic, and the ileocolic bundles.  The specimen was sent to pathology.  The two ends were then anastomosed in side-to-side fashion in standard fashion.  3-0 Silk sutures were used to imbricate the staple line to close the common channel.  The mesenteric defect was closed using 3-0 Vicryl suture.  We then proceeded with the sigmoid mass.  The sigmoid colon was also scarred down and the mobilization was also difficult.  The colon was mobilized from proximal rectum to the distal transverse colon, including mobilization of the splenic flexure.  There was bleeding encountered from the splenocolic attachments  and these were controlled with cautery and LigaSure.  Windows were created in the mesentery of distal descending colon and proximal rectum.  A blue load contour stapler was used to divide the proximal rectum, and a purse string clamp was used to  divide the distal descending colon.  The IMA was identified and the bundle was taken distal to the takeoff of the left colic bundle.  The rest of the mesentery was taken down using LigaSure.  Specimen was sent to pathology.  Dilators were used at the proximal stump and a 33 size was appropriate.  The EEA 33 stapler was opened and anvil secured to the purse string.  Epiploic fat was dissected off the anastomotic edge.  The stapler was passed transanally and the spike deployed through the rectal stump staple line.  The anvil and spike were connected, the stapler was closed and fired and removed.  The anastomosis was intact and tested with rigid sigmoidoscope and air insufflation under saline.  There were no bubbles to suggest a leak.  The corners of the rectal stump staple line were imbricated as well using 3-0 Silk sutures.    The abdomen was then thoroughly irrigated and hemostasis assured.  Arista was also used at the splenic flexure, hepatic flexure where the bleeding had been encountered.  We then proceeded to re-scrub and drape for our clean closure.  60 mL of Exparel solution was infiltrated onto peritoneum, fascia, and dermis.  The fascia was closed using looped PDS x 2.  The skin was irrigated and closed with staples. The incision was cleaned and dressed with Honeycomb dressing.  The patient was then placed back in flat supine position.  The patient was emerged from anesthesia and extubated and brought to the recovery room for further management.  The patient tolerated the procedure well and all counts were correct at the end of the case.   Melvyn Neth, MD

## 2017-11-03 LAB — GLUCOSE, CAPILLARY
GLUCOSE-CAPILLARY: 177 mg/dL — AB (ref 70–99)
GLUCOSE-CAPILLARY: 194 mg/dL — AB (ref 70–99)
Glucose-Capillary: 175 mg/dL — ABNORMAL HIGH (ref 70–99)
Glucose-Capillary: 211 mg/dL — ABNORMAL HIGH (ref 70–99)
Glucose-Capillary: 223 mg/dL — ABNORMAL HIGH (ref 70–99)

## 2017-11-03 LAB — CBC
HCT: 31.1 % — ABNORMAL LOW (ref 39.0–52.0)
HEMOGLOBIN: 10 g/dL — AB (ref 13.0–17.0)
MCH: 26.1 pg (ref 26.0–34.0)
MCHC: 32.2 g/dL (ref 30.0–36.0)
MCV: 81.2 fL (ref 80.0–100.0)
NRBC: 0 % (ref 0.0–0.2)
PLATELETS: 238 10*3/uL (ref 150–400)
RBC: 3.83 MIL/uL — AB (ref 4.22–5.81)
RDW: 14.7 % (ref 11.5–15.5)
WBC: 17.9 10*3/uL — AB (ref 4.0–10.5)

## 2017-11-03 LAB — BASIC METABOLIC PANEL
ANION GAP: 8 (ref 5–15)
BUN: 13 mg/dL (ref 8–23)
CHLORIDE: 106 mmol/L (ref 98–111)
CO2: 27 mmol/L (ref 22–32)
CREATININE: 1.05 mg/dL (ref 0.61–1.24)
Calcium: 9 mg/dL (ref 8.9–10.3)
GFR calc non Af Amer: >60 mL/min (ref >60)
Glucose, Bld: 221 mg/dL — ABNORMAL HIGH (ref 70–99)
POTASSIUM: 4 mmol/L (ref 3.5–5.1)
SODIUM: 141 mmol/L (ref 135–145)

## 2017-11-03 MED ORDER — BOOST / RESOURCE BREEZE PO LIQD CUSTOM: 1.0000 | Freq: Three times a day (TID) | ORAL | Status: DC

## 2017-11-03 MED ORDER — INSULIN ASPART 100 UNIT/ML ~~LOC~~ SOLN
0.0000 [IU] | Freq: Three times a day (TID) | SUBCUTANEOUS | Status: DC
  Administered 2017-11-03 (×2): 3 [IU] via SUBCUTANEOUS
  Administered 2017-11-04 (×2): 2 [IU] via SUBCUTANEOUS
  Administered 2017-11-04: 5 [IU] via SUBCUTANEOUS
  Administered 2017-11-05 (×2): 2 [IU] via SUBCUTANEOUS
  Administered 2017-11-06: 1 [IU] via SUBCUTANEOUS
  Filled 2017-11-03 (×8): qty 1

## 2017-11-03 NOTE — Progress Notes (Addendum)
[facility] [place] NAME: [name]    MR#:  [id number]  DATE OF BIRTH:  [birth year]  SUBJECTIVE:  Endorsing some mild central abdominal pain today.  No nausea, no vomiting. Tolerating clear liquid diet. Feels weak.  REVIEW OF SYSTEMS:    Constitutional: Negative for fever, chills weight loss HENT: Negative for ear pain, nosebleeds, congestion, facial swelling, rhinorrhea, neck pain, neck stiffness and ear discharge.   Respiratory: Negative for cough, shortness of breath, wheezing  Cardiovascular: Negative for chest pain, palpitations and leg swelling.  Gastrointestinal: Negative for heartburn, no vomiting, diarrhea or constipation. +abdominal pain Genitourinary: Negative for dysuria, urgency, frequency, hematuria Musculoskeletal: Negative for back pain or joint pain Neurological: Negative for dizziness, seizures, syncope, focal weakness,  numbness and headaches.  Hematological: Does not bruise/bleed easily.  Psychiatric/Behavioral: Negative for hallucinations, confusion, dysphoric mood  Tolerating Diet: clear liquid diet  DRUG ALLERGIES:  No Known Allergies  VITALS:  Blood pressure (!) 175/90, pulse 82, temperature 98.5 F (36.9 C), temperature source Oral, resp. rate 16, height 5\' 9"  (1.753 m), weight 64.2 kg, SpO2 97 %.  PHYSICAL EXAMINATION:  Constitutional: Appears well-developed and well-nourished. No distress. HENT: Normocephalic. Atraumatic. Oropharynx is clear and moist.  Eyes: Conjunctivae and EOM are normal. PERRLA, no scleral icterus.  Neck: Normal ROM. Neck supple. No JVD. No tracheal deviation. CVS: RRR, S1/S2 +, no murmurs, no gallops, no carotid bruit.  Pulmonary: Effort and breath sounds normal, no stridor, rhonchi, wheezes, rales.  Abdominal: + Midline abdominal surgical incision present with dry bandage over top, no surrounding erythema, no drainage. + Mild diffuse tenderness to palpation. No distension, no  rebound, no guarding Musculoskeletal: Normal range of motion. No edema and no tenderness.  Neuro: Awake, alert, moving all extremities Skin: Skin is warm and dry. No rash noted. Psychiatric: normal thought content, normal judgment  LABORATORY PANEL:   CBC Recent Labs  Lab 11/03/17 0503  WBC 17.9*  HGB 10.0*  HCT 31.1*  PLT 238   ------------------------------------------------------------------------------------------------------------------  Chemistries  Recent Labs  Lab 10/27/17 1415  11/03/17 0503  NA 136   < > 141  K 4.1   < > 4.0  CL 103   < > 106  CO2 26   < > 27  GLUCOSE 349*   < > 221*  BUN 18   < > 13  CREATININE 1.02   < > 1.05  CALCIUM 9.3   < > 9.0  AST 11*  --   --   ALT 8  --   --   ALKPHOS 83  --   --   BILITOT 0.5  --   --    < > = values in this interval not displayed.   ------------------------------------------------------------------------------------------------------------------  Cardiac Enzymes Recent Labs  Lab 10/27/17 1415  TROPONINI <0.03   ------------------------------------------------------------------------------------------------------------------  RADIOLOGY:  No results found.   ASSESSMENT AND PLAN:   77 year old male with history of CAD and diabetes who presents to the emergency room due to abdominal pain  1.  Adenocarcinoma of cecum and rectosigmoid- s/p right colectomy, sigmoidectomy, and splenic flexure mobilization on 11/01/17 -General surgery following-tolerating clears  -Oncology following-- Dr Rogue Bussing will do workup as outpatient  2.  Type 2 diabetes- blood sugars high after surgery -Add Lantus 12 units nightly  -Continue sliding scale and follow blood sugars  3.  BPH- Continue Flomax  4.  Essential hypertension- BPs soft this morning -Continue IV fluids -Continue Coreg  5.  CAD- Cleared for surgery by cardiology -Continue aspirin and coreg  6. Iron deficiency anemia and B12 deficiency- Iron  deficiency likely due to colon mass.  Hemoglobin stable. -B12 supplementation  7. Generalized deconditioning and weakness -seen physical therapy. Recommends rehab. Patient is adamant he does not want to go to rehab.  Management plans discussed with the patient and he is in agreement.  CODE STATUS: Full  TOTAL TIME TAKING CARE OF THIS PATIENT: 25 minutes.   POSSIBLE D/C in 3-4 days,  DEPENDING ON CLINICAL CONDITION.   Fritzi Mandes M.D on 11/03/2017 at 10:40 AM  Between 7am to 6pm - Pager - [phone] After 6pm go to [email or web address] - password [email or web address] [facility]  Office  [phone]  CC: Primary care physician; Valera Castle, MD  Note: This dictation was prepared with Dragon dictation along with smaller phrase technology. Any transcriptional errors that result from this process are unintentional.

## 2017-11-03 NOTE — Care Management Important Message (Signed)
Copy of signed IM left with patient in room.  

## 2017-11-03 NOTE — Progress Notes (Signed)
PT Cancellation Note  Patient Details Name: [name] MRN: [id number] DOB: [birth year]   Cancelled Treatment:    Reason Eval/Treat Not Completed: (Treatment session attempted per MD request.  Patient declined participation with therapist in any form.  Offered bed-level activity, OOB to chair; persistent refusal.  Reinforced role of PT in relation to his goal to discharge home; patient perseverative on his diet and lack of food since admission.  Insistent that he can't participate and "stand up" until he's had food and is stronger.  Will continue efforts as medically appropriate and patient allows.)   Alvan Culpepper H. Owens Shark, PT, DPT, NCS 11/03/17, 2:36 PM [phone]

## 2017-11-03 NOTE — Progress Notes (Signed)
[facility] Progress Note  2 Days Post-Op  Subjective: No acute events overnight. He is noting continued incisional soreness. No nausea or emesis. Worked with PT. Tolerated ice chips and sips yesterday.    Objective: Vital signs in last 24 hours: Temp:  [97.7 F (36.5 C)-98.5 F (36.9 C)] 98.5 F (36.9 C) (10/31 2027) Pulse Rate:  [63-82] 82 (11/01 0800) Resp:  [16-18] 16 (11/01 0800) BP: (102-175)/(49-90) 175/90 (11/01 0800) SpO2:  [96 %-98 %] 97 % (11/01 0800) Last BM Date: 11/01/17  Intake/Output from previous day: 10/31 0701 - 11/01 0700 In: 1735 [I.V.:1735] Out: 800 [Urine:800] Intake/Output this shift: No intake/output data recorded.  PE: Gen:  Alert, NAD, pleasant Pulm:  Normal effort Abd: Soft, incisional tenderness, non-distended, midline laparoscopic incision is C/D/I without drainage Skin: warm and dry, no rashes  Psych: A&Ox3   Lab Results:  Recent Labs    11/02/17 0355 11/03/17 0503  WBC 9.5 17.9*  HGB 10.9* 10.0*  HCT 33.8* 31.1*  PLT 226 238   BMET Recent Labs    11/02/17 0355 11/03/17 0503  NA 136 141  K 4.0 4.0  CL 107 106  CO2 20* 27  GLUCOSE 240* 221*  BUN 11 13  CREATININE 0.93 1.05  CALCIUM 8.2* 9.0   PT/INR No results for input(s): LABPROT, INR in the last 72 hours. CMP     Component Value Date/Time   NA 141 11/03/2017 0503   NA 135 (L) 05/14/2013 1615   K 4.0 11/03/2017 0503   K 3.7 05/14/2013 1615   CL 106 11/03/2017 0503   CL 100 05/14/2013 1615   CO2 27 11/03/2017 0503   CO2 29 05/14/2013 1615   GLUCOSE 221 (H) 11/03/2017 0503   GLUCOSE 263 (H) 05/14/2013 1615   BUN 13 11/03/2017 0503   BUN 10 05/14/2013 1615   CREATININE 1.05 11/03/2017 0503   CREATININE 1.16 05/14/2013 1615   CALCIUM 9.0 11/03/2017 0503   CALCIUM 9.3 05/14/2013 1615   PROT 7.0 10/27/2017 1415   PROT 6.5 11/25/2014 0911   PROT 7.8 05/14/2013 1615   ALBUMIN 3.5 10/27/2017 1415   ALBUMIN 4.2 11/25/2014 0911   ALBUMIN 3.5  05/14/2013 1615   AST 11 (L) 10/27/2017 1415   AST 20 05/14/2013 1615   ALT 8 10/27/2017 1415   ALT 14 05/14/2013 1615   ALKPHOS 83 10/27/2017 1415   ALKPHOS 224 (H) 05/14/2013 1615   BILITOT 0.5 10/27/2017 1415   BILITOT 0.7 11/25/2014 0911   BILITOT 0.5 05/14/2013 1615   GFRNONAA >60 11/03/2017 0503   GFRNONAA >60 05/14/2013 1615   GFRAA >60 11/03/2017 0503   GFRAA >60 05/14/2013 1615   Lipase     Component Value Date/Time   LIPASE 23 10/27/2017 1415       Studies/Results: No results found.  Anti-infectives: Anti-infectives (From admission, onward)   Start     Dose/Rate Route Frequency Ordered Stop   11/01/17 0800  cefoTEtan (CEFOTAN) 2 g in sodium chloride 0.9 % 100 mL IVPB     2 g 200 mL/hr over 30 Minutes Intravenous On call to O.R. 11/01/17 0759 11/01/17 1242   10/31/17 1200  erythromycin (ERY-TAB) EC tablet 1,000 mg     1,000 mg Oral Every 6 hours 10/31/17 1122 11/01/17 0004   10/31/17 1200  neomycin (MYCIFRADIN) tablet 1,000 mg     1,000 mg Oral Every 6 hours 10/31/17 1122 11/01/17 0003       Assessment/Plan  [name] is a  77 y.o. male who is 1 day s/p right colectomy and sigmoidectomy secondary to adenocarcinoma which is complicated by pertinent co-morbidities including CAD, DM, HTN, and HLD.   - Advance to clear liquid diet this morning - Continue IVF, will decrease as diet advances - Monitor abdominal exam and ongoing bowel function - Pain control (minimize narcotics), anti-emetics as needed - Mobilize - DVT Prophylaxis - Medicine following given multiple chronic medical conditions, appreciate their input   LOS: 7 days    Edison Simon , PA-C [facility] 11/03/2017, 8:44 AM [phone] M-F: 7am - 4pm

## 2017-11-03 NOTE — Progress Notes (Signed)
Inpatient Diabetes Program Recommendations  AACE/ADA: New Consensus Statement on Inpatient Glycemic Control (2015)  Target Ranges:  Prepandial:   less than 140 mg/dL      Peak postprandial:   less than 180 mg/dL (1-2 hours)      Critically ill patients:  140 - 180 mg/dL   Results for [name], [name] (MRN [id number]) as of 11/03/2017 12:38  Ref. Range 11/03/2017 00:54 11/03/2017 03:52 11/03/2017 07:46 11/03/2017 12:17  Glucose-Capillary Latest Ref Range: 70 - 99 mg/dL 177 (H) 194 (H) 175 (H) 211 (H)    Admit Colon Cancer  History:DM2  HomeDM Meds: Lantus 38unitsQHS  Novolog 20 unitsTID  Metformin 1586 mgBID Trulicity 1.5 mg Qweek  Current Orders:Novolog Sensitive Correction Scale/ SSI (0-9 units) TID AC      Lantus 12 units QHS      Started on CL diet diet today.  Lantus 12 units QHS started last PM.  CBGs still mildly elevated.     MD- Please consider increasing Lantus to 15 units QHS    --Will follow patient during hospitalization--  Wyn Quaker RN, MSN, CDE Diabetes Coordinator Inpatient Glycemic Control Team Team Pager: [phone] (8a-5p)

## 2017-11-04 LAB — CBC
HEMATOCRIT: 31.7 % — AB (ref 39.0–52.0)
Hemoglobin: 10.1 g/dL — ABNORMAL LOW (ref 13.0–17.0)
MCH: 25.8 pg — ABNORMAL LOW (ref 26.0–34.0)
MCHC: 31.9 g/dL (ref 30.0–36.0)
MCV: 81.1 fL (ref 80.0–100.0)
NRBC: 0 % (ref 0.0–0.2)
Platelets: 259 10*3/uL (ref 150–400)
RBC: 3.91 MIL/uL — ABNORMAL LOW (ref 4.22–5.81)
RDW: 14.9 % (ref 11.5–15.5)
WBC: 16.2 10*3/uL — ABNORMAL HIGH (ref 4.0–10.5)

## 2017-11-04 LAB — GLUCOSE, CAPILLARY
GLUCOSE-CAPILLARY: 181 mg/dL — AB (ref 70–99)
GLUCOSE-CAPILLARY: 188 mg/dL — AB (ref 70–99)
GLUCOSE-CAPILLARY: 157 mg/dL — AB (ref 70–99)
Glucose-Capillary: 184 mg/dL — ABNORMAL HIGH (ref 70–99)

## 2017-11-04 MED ORDER — ALUM & MAG HYDROXIDE-SIMETH 200-200-20 MG/5ML PO SUSP
30.0000 mL | Freq: Four times a day (QID) | ORAL | Status: DC | PRN
  Administered 2017-11-04: 30 mL via ORAL
  Filled 2017-11-04: qty 30

## 2017-11-04 NOTE — Progress Notes (Signed)
Physical Therapy Treatment Patient Details Name: BENTON TOOKER MRN: [id number] DOB: [birth year] Today's Date: 11/04/2017    History of Present Illness Pt is a 77 y.o. male with a known history of BPH, essential hypertension, diabetes, history of coronary artery disease, hyperlipidemia, and osteoarthritis who presented to the hospital complaining of abdominal pain.  Patient's abdominal pain has been ongoing now for the past few months.  CT done as an outpatient which showed intussusception in the ileocolic area.  Procedure revealed tumor in the colon and pt diagnosed with adenocarcinoma of cecum and rectosigmoid and is now s/p right colectomy and sigmoidectomy.  Assessment also includes CVA per patient around 4-5 months ago, DM, HTN, CAD, iron and B12 deficiency, and protein calorie malnutrition.    PT Comments    Pt resists but agrees to out of bed with encouragement.  To edge of bed with head raised and min assist.  He struggled to get fully upright but resisted assist.  Once sitting able to sit unsupported.  Stood with min guard and transferred to recliner at bedside.  Pt with poor walker positioning - keeps it too far ahead but resists education for proper and safe placement.  Despite positioning, pt was able to stand and transfer to recliner at bedside with min guard.  He does not stand fully but keeps posture crouched. Overall appears steadier than last session.  He refuses further mobility at this time, but agrees to remain up in chair for lunch.   Follow Up Recommendations  SNF     Equipment Recommendations  None recommended by PT    Recommendations for Other Services       Precautions / Restrictions Precautions Precautions: Fall Precaution Comments: Abdominal incision Restrictions Weight Bearing Restrictions: No    Mobility  Bed Mobility Overal bed mobility: Needs Assistance Bed Mobility: Supine to Sit   Sidelying to sit: Min guard       General bed mobility comments:  did not want assist but struggled to get fully upright  Transfers Overall transfer level: Needs assistance Equipment used: Rolling walker (2 wheeled) Transfers: Sit to/from Stand Sit to Stand: Min guard         General transfer comment: Mod verbal cues for sequencing, poor walker positioning to stand but resisted education  Ambulation/Gait Ambulation/Gait assistance: Min guard Gait Distance (Feet): 2 Feet Assistive device: Rolling walker (2 wheeled) Gait Pattern/deviations: Step-to pattern;Trunk flexed;Decreased step length - right;Decreased step length - left Gait velocity: Decreased       Stairs             Wheelchair Mobility    Modified Rankin (Stroke Patients Only)       Balance Overall balance assessment: Needs assistance Sitting-balance support: Feet supported;Bilateral upper extremity supported Sitting balance-Leahy Scale: Good     Standing balance support: Bilateral upper extremity supported Standing balance-Leahy Scale: Poor                              Cognition Arousal/Alertness: Awake/alert Behavior During Therapy: WFL for tasks assessed/performed;Agitated Overall Cognitive Status: Within Functional Limits for tasks assessed                                 General Comments: Agreed with encouragement but resisting assist and only allowed transfer to chair.      Exercises      General Comments  Pertinent Vitals/Pain Pain Assessment: Faces Faces Pain Scale: Hurts little more Pain Location: abdomen  Pain Descriptors / Indicators: Sore;Operative site guarding Pain Intervention(s): Limited activity within patient's tolerance;Monitored during session    Home Living                      Prior Function            PT Goals (current goals can now be found in the care plan section) Progress towards PT goals: Progressing toward goals    Frequency    Min 2X/week      PT Plan Current plan  remains appropriate    Co-evaluation              AM-PAC PT "6 Clicks" Daily Activity  Outcome Measure  Difficulty turning over in bed (including adjusting bedclothes, sheets and blankets)?: None Difficulty moving from lying on back to sitting on the side of the bed? : A Little Difficulty sitting down on and standing up from a chair with arms (e.g., wheelchair, bedside commode, etc,.)?: A Little Help needed moving to and from a bed to chair (including a wheelchair)?: A Little Help needed walking in hospital room?: A Lot Help needed climbing 3-5 steps with a railing? : A Lot 6 Click Score: 17    End of Session Equipment Utilized During Treatment: Gait belt Activity Tolerance: Patient tolerated treatment well;Patient limited by fatigue;Treatment limited secondary to agitation Patient left: in chair;with call bell/phone within reach;with chair alarm set         Time: 1130-1140 PT Time Calculation (min) (ACUTE ONLY): 10 min  Charges:  $Therapeutic Activity: 8-22 mins                     Chesley Noon, PTA 11/04/17, 12:49 PM

## 2017-11-04 NOTE — Progress Notes (Signed)
[facility] [place] NAME: [name]    MR#:  [id number]  DATE OF BIRTH:  1940/09/15  SUBJECTIVE:  Endorsing some mild central abdominal pain today.  No nausea, no vomiting. Tolerating soft diet. Feels weak. Out in the chair  REVIEW OF SYSTEMS:    Constitutional: Negative for fever, chills weight loss HENT: Negative for ear pain, nosebleeds, congestion, facial swelling, rhinorrhea, neck pain, neck stiffness and ear discharge.   Respiratory: Negative for cough, shortness of breath, wheezing  Cardiovascular: Negative for chest pain, palpitations and leg swelling.  Gastrointestinal: Negative for heartburn, no vomiting, diarrhea or constipation. +abdominal pain Genitourinary: Negative for dysuria, urgency, frequency, hematuria Musculoskeletal: Negative for back pain or joint pain Neurological: Negative for dizziness, seizures, syncope, focal weakness,  numbness and headaches.  Hematological: Does not bruise/bleed easily.  Psychiatric/Behavioral: Negative for hallucinations, confusion, dysphoric mood  Tolerating Diet: clear liquid diet  DRUG ALLERGIES:  No Known Allergies  VITALS:  Blood pressure 116/63, pulse 66, temperature (!) 96.4 F (35.8 C), temperature source Axillary, resp. rate 20, height 5\' 9"  (1.753 m), weight 64.2 kg, SpO2 97 %.  PHYSICAL EXAMINATION:  Constitutional: Appears well-developed and well-nourished. No distress. HENT: Normocephalic. Atraumatic. Oropharynx is clear and moist.  Eyes: Conjunctivae and EOM are normal. PERRLA, no scleral icterus.  Neck: Normal ROM. Neck supple. No JVD. No tracheal deviation. CVS: RRR, S1/S2 +, no murmurs, no gallops, no carotid bruit.  Pulmonary: Effort and breath sounds normal, no stridor, rhonchi, wheezes, rales.  Abdominal: + Midline abdominal surgical incision present with dry bandage over top, no surrounding erythema, no drainage.  No distension, no rebound, no  guarding Musculoskeletal: Normal range of motion. No edema and no tenderness.  Neuro: Awake, alert, moving all extremities Skin: Skin is warm and dry. No rash noted. Psychiatric: normal thought content, normal judgment  LABORATORY PANEL:   CBC Recent Labs  Lab 11/04/17 0409  WBC 16.2*  HGB 10.1*  HCT 31.7*  PLT 259   ------------------------------------------------------------------------------------------------------------------  Chemistries  Recent Labs  Lab 11/03/17 0503  NA 141  K 4.0  CL 106  CO2 27  GLUCOSE 221*  BUN 13  CREATININE 1.05  CALCIUM 9.0   ------------------------------------------------------------------------------------------------------------------  Cardiac Enzymes No results for input(s): TROPONINI in the last 168 hours. ------------------------------------------------------------------------------------------------------------------  RADIOLOGY:  No results found.   ASSESSMENT AND PLAN:   77 year old male with history of CAD and diabetes who presents to the emergency room due to abdominal pain  1.  Adenocarcinoma of cecum and rectosigmoid- s/p right colectomy, sigmoidectomy, and splenic flexure mobilization on 11/01/17 -General surgery following-tolerating clears --advanced to soft diet -Oncology following-- Dr Rogue Bussing will do workup as outpatient  2.  Type 2 diabetes- blood sugars high after surgery -Add Lantus 12 units nightly  -Continue sliding scale and follow blood sugars  3.  BPH- Continue Flomax  4.  Essential hypertension- BPs soft this morning -Continue IV fluids -Continue Coreg  5.  CAD- Cleared for surgery by cardiology -Continue aspirin and coreg  6. Iron deficiency anemia and B12 deficiency- Iron deficiency likely due to colon mass.  Hemoglobin stable. -B12 supplementation  7. Generalized deconditioning and weakness -seen physical therapy. Recommends rehab. Patient is adamant he does not want to go to  rehab.  Management plans discussed with the patient and he is in agreement.  CODE STATUS: Full  TOTAL TIME TAKING CARE OF THIS PATIENT: 25 minutes.   POSSIBLE D/C in next couple days,  DEPENDING ON  CLINICAL CONDITION.   Fritzi Mandes M.D on 11/04/2017 at 2:05 PM  Between 7am to 6pm - Pager - [phone] After 6pm go to [email or web address] - password [email or web address] [facility]  Office  [phone]  CC: Primary care physician; Valera Castle, MD  Note: This dictation was prepared with Dragon dictation along with smaller phrase technology. Any transcriptional errors that result from this process are unintentional.

## 2017-11-04 NOTE — Progress Notes (Signed)
Subjective:  CC:  [name] is a 77 y.o. male  Hospital stay day 8, 3 Days Post-Op right colectomy and sigmoidectomy for syncronous colon CA.  HPI: No issues overnight.  Three recorded BMs.  Asking for food.  Refusing PT because he has not eaten.  ROS:  A 5 point review of systems was performed and pertinent positives and negatives noted in HPI.   Objective:      Temp:  [98.1 F (36.7 C)-98.3 F (36.8 C)] 98.3 F (36.8 C) (11/02 0540) Pulse Rate:  [72-78] 72 (11/02 0540) Resp:  [18-20] 20 (11/02 0540) BP: (172-174)/(68-85) 172/85 (11/02 0540) SpO2:  [95 %-96 %] 96 % (11/02 0540)     Height: 5\' 9"  (175.3 cm) Weight: 64.2 kg(bed scale) BMI (Calculated): 20.89   Intake/Output this shift:  Total I/O In: 240 [P.O.:240] Out: -        Constitutional :  alert, cooperative, appears stated age and no distress  Respiratory:  clear to auscultation bilaterally  Cardiovascular:  regular rate and rhythm  Gastrointestinal: soft, non-tender; bowel sounds normal; no masses,  no organomegaly.   Skin: Cool and moist. Incision c/d/i  Psychiatric: Normal affect, non-agitated, not confused       LABS:  CMP Latest Ref Rng & Units 11/03/2017 11/02/2017 11/01/2017  Glucose 70 - 99 mg/dL 221(H) 240(H) 206(H)  BUN 8 - 23 mg/dL 13 11 9   Creatinine 0.61 - 1.24 mg/dL 1.05 0.93 0.81  Sodium 135 - 145 mmol/L 141 136 137  Potassium 3.5 - 5.1 mmol/L 4.0 4.0 3.8  Chloride 98 - 111 mmol/L 106 107 108  CO2 22 - 32 mmol/L 27 20(L) 24  Calcium 8.9 - 10.3 mg/dL 9.0 8.2(L) 8.9  Total Protein 6.5 - 8.1 g/dL - - -  Total Bilirubin 0.3 - 1.2 mg/dL - - -  Alkaline Phos 38 - 126 U/L - - -  AST 15 - 41 U/L - - -  ALT 0 - 44 U/L - - -   CBC Latest Ref Rng & Units 11/04/2017 11/03/2017 11/02/2017  WBC 4.0 - 10.5 K/uL 16.2(H) 17.9(H) 9.5  Hemoglobin 13.0 - 17.0 g/dL 10.1(L) 10.0(L) 10.9(L)  Hematocrit 39.0 - 52.0 % 31.7(L) 31.1(L) 33.8(L)  Platelets 150 - 400 K/uL 259 238 226     RADS: n/a Assessment:   3 Days Post-Op right colectomy and sigmoidectomy for syncronous colon CA.  Clinically doing well, leukocytosis decreasing.  Ok to advance to soft diet and continue to monitor.  Recommend stop IVF.  Pt states he no longer takes plavix.

## 2017-11-05 LAB — CBC WITH DIFFERENTIAL/PLATELET
Abs Immature Granulocytes: 0.06 10*3/uL (ref 0.00–0.07)
Basophils Absolute: 0 10*3/uL (ref 0.0–0.1)
Basophils Relative: 0 %
EOS PCT: 1 %
Eosinophils Absolute: 0.1 10*3/uL (ref 0.0–0.5)
HCT: 31.7 % — ABNORMAL LOW (ref 39.0–52.0)
Hemoglobin: 10.4 g/dL — ABNORMAL LOW (ref 13.0–17.0)
Immature Granulocytes: 1 %
Lymphocytes Relative: 13 %
Lymphs Abs: 1.4 10*3/uL (ref 0.7–4.0)
MCH: 26.6 pg (ref 26.0–34.0)
MCHC: 32.8 g/dL (ref 30.0–36.0)
MCV: 81.1 fL (ref 80.0–100.0)
MONO ABS: 0.9 10*3/uL (ref 0.1–1.0)
Monocytes Relative: 9 %
NEUTROS ABS: 8 10*3/uL — AB (ref 1.7–7.7)
NRBC: 0 % (ref 0.0–0.2)
Neutrophils Relative %: 76 %
PLATELETS: 273 10*3/uL (ref 150–400)
RBC: 3.91 MIL/uL — AB (ref 4.22–5.81)
RDW: 14.4 % (ref 11.5–15.5)
WBC: 10.5 10*3/uL (ref 4.0–10.5)

## 2017-11-05 LAB — GLUCOSE, CAPILLARY
GLUCOSE-CAPILLARY: 133 mg/dL — AB (ref 70–99)
Glucose-Capillary: 180 mg/dL — ABNORMAL HIGH (ref 70–99)
Glucose-Capillary: 161 mg/dL — ABNORMAL HIGH (ref 70–99)
Glucose-Capillary: 179 mg/dL — ABNORMAL HIGH (ref 70–99)

## 2017-11-05 MED ORDER — INSULIN GLARGINE 100 UNIT/ML ~~LOC~~ SOLN
15.0000 [IU] | Freq: Every day | SUBCUTANEOUS | Status: DC
  Administered 2017-11-05: 15 [IU] via SUBCUTANEOUS
  Filled 2017-11-05 (×2): qty 0.15

## 2017-11-05 MED ORDER — METFORMIN HCL 500 MG PO TABS
1000.0000 mg | ORAL_TABLET | Freq: Two times a day (BID) | ORAL | Status: DC
  Administered 2017-11-06: 1000 mg via ORAL
  Filled 2017-11-05: qty 2

## 2017-11-05 NOTE — Clinical Social Work Note (Signed)
The CSW received a verbal update from the attending MD that the patient's family has given verbal permission to begin SNF referral in case they can sway the patient as they are concerned about his self-care should he return home. The CSW has done so and will follow up with bed offers as they become available.  [name], MSW, Latanya Presser [phone]

## 2017-11-05 NOTE — NC FL2 (Addendum)
[place] LEVEL OF CARE SCREENING TOOL     IDENTIFICATION  Patient Name: [name] Birthdate: [birth year] Sex: male Admission Date (Current Location): 10/27/2017  [place] and [place] Number:  Engineering geologist and Address:  [facility], [address] [place], [place] [address]      Provider Number: [id number]  Attending Physician Name and Address:  Fritzi Mandes, MD  Relative Name and Phone Number:  Dortha Schwalbe Daughter   [phone]     Current Level of Care: Hospital Recommended Level of Care: [place] Prior Approval Number:    Date Approved/Denied:   PASRR Number:   [id number]   Discharge Plan: SNF    Current Diagnoses: Patient Active Problem List   Diagnosis Date Noted  . Cancer of ascending colon ([place])   . Cancer of rectosigmoid (colon) ([place])   . Protein-calorie malnutrition, severe 10/29/2017  . Abdominal pain 10/27/2017  . Intussusception ([place])   . Rhabdomyolysis 06/16/2016  . Acute encephalopathy 06/16/2016  . Pressure injury of skin 06/16/2016  . Nausea and vomiting 07/31/2015  . H. pylori infection 07/31/2015  . Odynophagia 07/31/2015  . Generalized weakness 07/31/2015  . Dehydration 07/31/2015  . Acute kidney injury ([place]) 07/28/2015  . CAD (coronary artery disease)   . Essential hypertension   . Hypoglycemia associated with diabetes ([place])   . S/P coronary artery stent placement   . Type 2 diabetes mellitus with circulatory disorder ([place])   . Hyperlipidemia   . ST elevation myocardial infarction (STEMI) of anterior wall ([place]) 10/20/2014  . ST elevation (STEMI) myocardial infarction involving left anterior descending coronary artery (H[facility])     Orientation RESPIRATION BLADDER Height & Weight     Self, Time, Situation, Place    Continent Weight: 141 lb 8.6 oz (64.2 kg)(bed scale) Height:  5\' 9"  (175.3 cm)  BEHAVIORAL SYMPTOMS/MOOD NEUROLOGICAL BOWEL NUTRITION STATUS      Continent     AMBULATORY STATUS COMMUNICATION OF NEEDS Skin   Extensive Assist Verbally Surgical wounds                       Personal Care Assistance Level of Assistance  Bathing, Feeding, Dressing Bathing Assistance: Limited assistance Feeding assistance: Independent Dressing Assistance: Limited assistance     Functional Limitations Info  Sight, Hearing, Speech Sight Info: Adequate Hearing Info: Adequate Speech Info: Adequate    SPECIAL CARE FACTORS FREQUENCY  PT (By licensed PT)     PT Frequency: Up to 5X per week              Contractures Contractures Info: Not present    Additional Factors Info  Code Status, Allergies Code Status Info: Full Allergies Info: No Known Allergies           Current Medications (11/05/2017):  This is the current hospital active medication list Current Facility-Administered Medications  Medication Dose Route Frequency Provider Last Rate Last Dose  . acetaminophen (TYLENOL) tablet 650 mg  650 mg Oral Q6H PRN Piscoya, Jose, MD   650 mg at 11/04/17 2020   Or  . acetaminophen (TYLENOL) suppository 650 mg  650 mg Rectal Q6H PRN Piscoya, Jose, MD      . alum & mag hydroxide-simeth (MAALOX/MYLANTA) 200-200-20 MG/5ML suspension 30 mL  30 mL Oral Q6H PRN Fritzi Mandes, MD   30 mL at 11/04/17 1302  . aspirin EC tablet 81 mg  81 mg Oral Daily Mayo, Pete Pelt, MD   81 mg  at 11/05/17 0912  . carvedilol (COREG) tablet 3.125 mg  3.125 mg Oral BID WC Piscoya, Jose, MD   3.125 mg at 11/05/17 0912  . feeding supplement (BOOST / RESOURCE BREEZE) liquid 1 Container  1 Container Oral TID BM Edison Simon R, PA-C      . insulin aspart (novoLOG) injection 0-9 Units  0-9 Units Subcutaneous TID WC Olean Ree, MD   2 Units at 11/05/17 0912  . insulin glargine (LANTUS) injection 15 Units  15 Units Subcutaneous QHS Fritzi Mandes, MD      . metFORMIN (GLUCOPHAGE) tablet 1,000 mg  1,000 mg Oral BID WC Fritzi Mandes, MD      . multivitamin with minerals tablet 1 tablet  1  tablet Oral Daily Piscoya, Jose, MD   1 tablet at 11/05/17 0912  . nitroGLYCERIN (NITROSTAT) SL tablet 0.4 mg  0.4 mg Sublingual Q5 min PRN Piscoya, Jose, MD      . ondansetron (ZOFRAN) tablet 4 mg  4 mg Oral Q6H PRN Piscoya, Jose, MD       Or  . ondansetron (ZOFRAN) injection 4 mg  4 mg Intravenous Q6H PRN Olean Ree, MD   4 mg at 11/03/17 0336  . tamsulosin (FLOMAX) capsule 0.4 mg  0.4 mg Oral Daily Piscoya, Jose, MD   0.4 mg at 11/05/17 0912  . vitamin B-12 (CYANOCOBALAMIN) tablet 1,000 mcg  1,000 mcg Oral Daily Olean Ree, MD   1,000 mcg at 11/05/17 6503     Discharge Medications: Please see discharge summary for a list of discharge medications.  Relevant Imaging Results:  Relevant Lab Results:   Additional Information SS# [id number]  Zettie Pho, LCSW

## 2017-11-05 NOTE — Progress Notes (Signed)
[place] [facility] [place] NAME: [name]    MR#:  [id number]  DATE OF BIRTH:  02/05/1940  SUBJECTIVE:    No nausea, no vomiting. Tolerating regular diet. Feels weak.   REVIEW OF SYSTEMS:    Constitutional: Negative for fever, chills weight loss HENT: Negative for ear pain, nosebleeds, congestion, facial swelling, rhinorrhea, neck pain, neck stiffness and ear discharge.   Respiratory: Negative for cough, shortness of breath, wheezing  Cardiovascular: Negative for chest pain, palpitations and leg swelling.  Gastrointestinal: Negative for heartburn, no vomiting, diarrhea or constipation. +abdominal pain Genitourinary: Negative for dysuria, urgency, frequency, hematuria Musculoskeletal: Negative for back pain or joint pain Neurological: Negative for dizziness, seizures, syncope, focal weakness,  numbness and headaches.  Hematological: Does not bruise/bleed easily.  Psychiatric/Behavioral: Negative for hallucinations, confusion, dysphoric mood  Tolerating Diet: regular diet  DRUG ALLERGIES:  No Known Allergies  VITALS:  Blood pressure 137/65, pulse 62, temperature 98.3 F (36.8 C), temperature source Oral, resp. rate 18, height 5\' 9"  (1.753 m), weight 64.2 kg, SpO2 96 %.  PHYSICAL EXAMINATION:  Constitutional: Appears well-developed and well-nourished. No distress. HENT: Normocephalic. Atraumatic. Oropharynx is clear and moist.  Eyes: Conjunctivae and EOM are normal. PERRLA, no scleral icterus.  Neck: Normal ROM. Neck supple. No JVD. No tracheal deviation. CVS: RRR, S1/S2 +, no murmurs, no gallops, no carotid bruit.  Pulmonary: Effort and breath sounds normal, no stridor, rhonchi, wheezes, rales.  Abdominal: + Midline abdominal surgical incision present with dry bandage over top, no surrounding erythema, no drainage.  No distension, no rebound, no guarding Musculoskeletal: Normal range of motion. No edema and no tenderness.  Neuro:  Awake, alert, moving all extremities Skin: Skin is warm and dry. No rash noted. Psychiatric: normal thought content, normal judgment  LABORATORY PANEL:   CBC Recent Labs  Lab 11/04/17 0409  WBC 16.2*  HGB 10.1*  HCT 31.7*  PLT 259   ------------------------------------------------------------------------------------------------------------------  Chemistries  Recent Labs  Lab 11/03/17 0503  NA 141  K 4.0  CL 106  CO2 27  GLUCOSE 221*  BUN 13  CREATININE 1.05  CALCIUM 9.0   ------------------------------------------------------------------------------------------------------------------  Cardiac Enzymes No results for input(s): TROPONINI in the last 168 hours. ------------------------------------------------------------------------------------------------------------------  RADIOLOGY:  No results found.   ASSESSMENT AND PLAN:   77 year old male with history of CAD and diabetes who presents to the emergency room due to abdominal pain  1. Adenocarcinoma of cecum and rectosigmoid- s/p right colectomy, sigmoidectomy, and splenic flexure mobilization on 11/01/17 -General surgery following-tolerating clears --advanced to soft diet -Oncology following-- Dr Rogue Bussing will do workup as outpatient  2. Type 2 diabetes- blood sugars high after surgery -Add Lantus 15 units nightly  -Continue sliding scale and follow blood sugars Resume metformin  3.  BPH- Continue Flomax  4.  Essential hypertension- BPs soft this morning -Continue Coreg  5.  CAD- pt wasleared for surgery by cardiology -Continue aspirin and coreg  6. Iron deficiency anemia and B12 deficiency- Iron deficiency likely due to colon mass.  Hemoglobin stable. -B12 supplementation  7. Generalized deconditioning and weakness -seen physical therapy. Recommends rehab. Patient is adamant he does not want to go to rehab.  Spoke with daughter Otho Perl regarding patient and updated her. Discussed with social  worker to send out request for rehab in case patient decides to else patient will go home with home health PT tomorrow if okay with surgery. Daughter aware.  Management plans discussed with the patient and he  is in agreement.  CODE STATUS: Full  TOTAL TIME TAKING CARE OF THIS PATIENT: 25 minutes.   POSSIBLE D/C 1-2,  DEPENDING ON CLINICAL CONDITION.   Fritzi Mandes M.D on 11/05/2017 at 9:12 AM  Between 7am to 6pm - Pager - [phone] After 6pm go to [email or web address] - password [email or web address] [facility]  Office  [phone]  CC: Primary care physician; Valera Castle, MD  Note: This dictation was prepared with Dragon dictation along with smaller phrase technology. Any transcriptional errors that result from this process are unintentional.

## 2017-11-05 NOTE — Progress Notes (Addendum)
Subjective:  CC:  [name] is a 77 y.o. male  Hospital stay day 9, 4 Days Post-Op right colectomy and sigmoidectomy for syncronous colon CA.  HPI: No issues overnight.  Tolerating soft, more BMs recorded.  Complaining food tastes bad.  ROS:  A 5 point review of systems was performed and pertinent positives and negatives noted in HPI.   Objective:      Temp:  [96.4 F (35.8 C)-98.5 F (36.9 C)] 98.5 F (36.9 C) (11/03 1157) Pulse Rate:  [62-77] 77 (11/03 1157) Resp:  [18-20] 20 (11/03 1157) BP: (116-137)/(58-65) 129/58 (11/03 1157) SpO2:  [93 %-97 %] 93 % (11/03 1157)     Height: 5\' 9"  (175.3 cm) Weight: 64.2 kg(bed scale) BMI (Calculated): 20.89   Intake/Output this shift:  No intake/output data recorded.       Constitutional :  alert, cooperative, appears stated age and no distress  Respiratory:  clear to auscultation bilaterally  Cardiovascular:  regular rate and rhythm  Gastrointestinal: soft, non-tender; bowel sounds normal; no masses,  no organomegaly.   Skin: Cool and moist. Incision c/d/i  Psychiatric: Normal affect, non-agitated, not confused       LABS:  CMP Latest Ref Rng & Units 11/03/2017 11/02/2017 11/01/2017  Glucose 70 - 99 mg/dL 221(H) 240(H) 206(H)  BUN 8 - 23 mg/dL 13 11 9   Creatinine 0.61 - 1.24 mg/dL 1.05 0.93 0.81  Sodium 135 - 145 mmol/L 141 136 137  Potassium 3.5 - 5.1 mmol/L 4.0 4.0 3.8  Chloride 98 - 111 mmol/L 106 107 108  CO2 22 - 32 mmol/L 27 20(L) 24  Calcium 8.9 - 10.3 mg/dL 9.0 8.2(L) 8.9  Total Protein 6.5 - 8.1 g/dL - - -  Total Bilirubin 0.3 - 1.2 mg/dL - - -  Alkaline Phos 38 - 126 U/L - - -  AST 15 - 41 U/L - - -  ALT 0 - 44 U/L - - -   CBC Latest Ref Rng & Units 11/04/2017 11/03/2017 11/02/2017  WBC 4.0 - 10.5 K/uL 16.2(H) 17.9(H) 9.5  Hemoglobin 13.0 - 17.0 g/dL 10.1(L) 10.0(L) 10.9(L)  Hematocrit 39.0 - 52.0 % 31.7(L) 31.1(L) 33.8(L)  Platelets 150 - 400 K/uL 259 238 226    RADS: n/a Assessment:   4 Days  Post-Op right colectomy and sigmoidectomy for syncronous colon CA.   OK to d/c from surgery standpoint if CBC shows downtrending WBC.   F/u Dr. Hampton Abbot in three days or so for staple removal.  Ok to remove outer dressing and shower as needed.  No lifting greater than 10lbs until cleared by Dr. Hampton Abbot.

## 2017-11-06 LAB — GLUCOSE, CAPILLARY: GLUCOSE-CAPILLARY: 131 mg/dL — AB (ref 70–99)

## 2017-11-06 MED ORDER — ADULT MULTIVITAMIN W/MINERALS CH: 1.0000 | ORAL_TABLET | Freq: Every day | ORAL | 1 refills | Status: AC

## 2017-11-06 MED ORDER — INSULIN GLARGINE 100 UNIT/ML ~~LOC~~ SOLN: 20.0000 [IU] | Freq: Every day | SUBCUTANEOUS | 11 refills | Status: AC

## 2017-11-06 MED ORDER — CYANOCOBALAMIN 1000 MCG PO TABS: 1000.0000 ug | ORAL_TABLET | Freq: Every day | ORAL | 1 refills | Status: AC

## 2017-11-06 MED ORDER — INSULIN ASPART 100 UNIT/ML ~~LOC~~ SOLN: 8.0000 [IU] | Freq: Three times a day (TID) | SUBCUTANEOUS | 11 refills | Status: AC

## 2017-11-06 NOTE — Care Management Note (Signed)
Case Management Note  Patient Details  Name: [name] MRN: [id number] Date of Birth: 1940-11-16   Patient status post colectomy and sigmoidectomy.  Patient lives at home alone. Daughter lives locally for support.  Patient states that at baseline he drives and completes his ADL's independently.  PCP [employer]  Patient denies issues obtaining medications.  Daughter will be available for transportation if needed after discharge.  Patient states that he has a RW, cane, and BSC in the home.  PT has assessed patient and recommends SNF.  Patient has declined SNF.  MD has ordered home health.  Initially patient was hesitant to accept services, however he is now agreeable.  Patient states that he does not have a preference of agency.  Referral made to [facility] with [place].  Daughter Lorenda Ishihara request that she be called to set up appointments at [phone].  [place] provided with this information.  RNCM signing off.    Subjective/Objective:                    Action/Plan:   Expected Discharge Date:  11/06/17               Expected Discharge Plan:  [place]  In-House Referral:     Discharge planning Services  CM Consult  Post Acute Care Choice:  Home Health Choice offered to:  Patient, Adult Children  DME Arranged:    DME Agency:     HH Arranged:  RN, PT [place] Agency:  [place]  Status of Service:  Completed, signed off  If discussed at [place] of Stay Meetings, dates discussed:    Additional Comments:  Beverly Sessions, RN 11/06/2017, 1:39 PM

## 2017-11-06 NOTE — Progress Notes (Signed)
Physical Therapy Treatment Patient Details Name: [name] MRN: [id number] DOB: [birth year] Today's Date: 11/06/2017    History of Present Illness Pt is a 77 y.o. male with a known history of BPH, essential hypertension, diabetes, history of coronary artery disease, hyperlipidemia, and osteoarthritis who presented to the hospital complaining of abdominal pain.  Patient's abdominal pain has been ongoing now for the past few months.  CT done as an outpatient which showed intussusception in the ileocolic area.  Procedure revealed tumor in the colon and pt diagnosed with adenocarcinoma of cecum and rectosigmoid and is now s/p right colectomy and sigmoidectomy.  Assessment also includes CVA per patient around 4-5 months ago, DM, HTN, CAD, iron and B12 deficiency, and protein calorie malnutrition.    PT Comments    Pt in bed, stated he is going home today.  Bed mobility with increased ease today.  Stood and transferred to recliner at bedside.  Then stood and walked to/from commode in room with walker and supervision.  Pt with improved mobility today but remains self limiting.  While he would benefit from SNF, he is resistant and wished to return home.  Stated he will be mostly sedentary at home with limited gait and activity.  He has all available equipment.    Follow Up Recommendations  SNF  - would benefit from HHPT since he is declining SNF but compliance may be questionable.     Equipment Recommendations  None recommended by PT    Recommendations for Other Services       Precautions / Restrictions Precautions Precautions: Fall Precaution Comments: Abdominal incision Restrictions Weight Bearing Restrictions: No    Mobility  Bed Mobility Overal bed mobility: Modified Independent Bed Mobility: Supine to Sit   Sidelying to sit: Modified independent (Device/Increase time)       General bed mobility comments: less cumbersome for pt today, no assist needed  Transfers Overall  transfer level: Modified independent Equipment used: Rolling walker (2 wheeled) Transfers: Sit to/from Stand Sit to Stand: Supervision            Ambulation/Gait Ambulation/Gait assistance: Supervision Gait Distance (Feet): 10 Feet Assistive device: Rolling walker (2 wheeled) Gait Pattern/deviations: Step-through pattern;Trunk flexed     General Gait Details: to commode and back, generally steady   Chief Strategy Officer    Modified Rankin (Stroke Patients Only)       Balance Overall balance assessment: Needs assistance Sitting-balance support: Feet supported Sitting balance-Leahy Scale: Good     Standing balance support: Bilateral upper extremity supported Standing balance-Leahy Scale: Fair                              Cognition Arousal/Alertness: Awake/alert Behavior During Therapy: WFL for tasks assessed/performed Overall Cognitive Status: Within Functional Limits for tasks assessed                                        Exercises Other Exercises Other Exercises: commode to void and small BM    General Comments        Pertinent Vitals/Pain Pain Assessment: No/denies pain    Home Living                      Prior Function  PT Goals (current goals can now be found in the care plan section) Progress towards PT goals: Progressing toward goals    Frequency    Min 2X/week      PT Plan Current plan remains appropriate    Co-evaluation              AM-PAC PT "6 Clicks" Daily Activity  Outcome Measure  Difficulty turning over in bed (including adjusting bedclothes, sheets and blankets)?: None Difficulty moving from lying on back to sitting on the side of the bed? : None Difficulty sitting down on and standing up from a chair with arms (e.g., wheelchair, bedside commode, etc,.)?: A Little Help needed moving to and from a bed to chair (including a wheelchair)?: A  Little Help needed walking in hospital room?: A Little Help needed climbing 3-5 steps with a railing? : A Little 6 Click Score: 20    End of Session Equipment Utilized During Treatment: Gait belt Activity Tolerance: Patient tolerated treatment well Patient left: in chair;with call bell/phone within reach;with chair alarm set         Time: 4373-5789 PT Time Calculation (min) (ACUTE ONLY): 9 min  Charges:  $Gait Training: 8-22 mins                    Chesley Noon, PTA 11/06/17, 9:32 AM

## 2017-11-06 NOTE — Clinical Social Work Note (Signed)
Patient discharging today to return home. Shela Leff MSW,LCSW [phone]

## 2017-11-06 NOTE — Progress Notes (Signed)
[facility] Progress Note  5 Days Post-Op  Subjective: No acute events overnight. His biggest complaint is about the taste of the food. Has tolerated a diet. Endorses passing flatus and having multiple BMs, reports 5 yesterday. Working with physical therapy and recommending SNF at discharge. No additional complaints.    Objective: Vital signs in last 24 hours: Temp:  [97.7 F (36.5 C)-98.6 F (37 C)] 97.7 F (36.5 C) (11/04 0503) Pulse Rate:  [63-78] 68 (11/04 0754) Resp:  [18-20] 20 (11/04 0503) BP: (116-129)/(52-68) 116/63 (11/04 0754) SpO2:  [93 %-97 %] 96 % (11/04 0503) Last BM Date: 11/04/17  Intake/Output from previous day: No intake/output data recorded. Intake/Output this shift: No intake/output data recorded.  PE: Gen:  Alert, NAD, pleasant Pulm:  Normal effort Abd: Soft, incisional soreness, non-distended, midline laparoscopic incision is C/D/I without drainage Skin: warm and dry, no rashes  Psych: A&Ox3    Lab Results:  Recent Labs    11/04/17 0409 11/05/17 1312  WBC 16.2* 10.5  HGB 10.1* 10.4*  HCT 31.7* 31.7*  PLT 259 273   BMET No results for input(s): NA, K, CL, CO2, GLUCOSE, BUN, CREATININE, CALCIUM in the last 72 hours. PT/INR No results for input(s): LABPROT, INR in the last 72 hours. CMP     Component Value Date/Time   NA 141 11/03/2017 0503   NA 135 (L) 05/14/2013 1615   K 4.0 11/03/2017 0503   K 3.7 05/14/2013 1615   CL 106 11/03/2017 0503   CL 100 05/14/2013 1615   CO2 27 11/03/2017 0503   CO2 29 05/14/2013 1615   GLUCOSE 221 (H) 11/03/2017 0503   GLUCOSE 263 (H) 05/14/2013 1615   BUN 13 11/03/2017 0503   BUN 10 05/14/2013 1615   CREATININE 1.05 11/03/2017 0503   CREATININE 1.16 05/14/2013 1615   CALCIUM 9.0 11/03/2017 0503   CALCIUM 9.3 05/14/2013 1615   PROT 7.0 10/27/2017 1415   PROT 6.5 11/25/2014 0911   PROT 7.8 05/14/2013 1615   ALBUMIN 3.5 10/27/2017 1415   ALBUMIN 4.2 11/25/2014 0911   ALBUMIN 3.5  05/14/2013 1615   AST 11 (L) 10/27/2017 1415   AST 20 05/14/2013 1615   ALT 8 10/27/2017 1415   ALT 14 05/14/2013 1615   ALKPHOS 83 10/27/2017 1415   ALKPHOS 224 (H) 05/14/2013 1615   BILITOT 0.5 10/27/2017 1415   BILITOT 0.7 11/25/2014 0911   BILITOT 0.5 05/14/2013 1615   GFRNONAA >60 11/03/2017 0503   GFRNONAA >60 05/14/2013 1615   GFRAA >60 11/03/2017 0503   GFRAA >60 05/14/2013 1615   Lipase     Component Value Date/Time   LIPASE 23 10/27/2017 1415       Studies/Results: No results found.  Anti-infectives: Anti-infectives (From admission, onward)   Start     Dose/Rate Route Frequency Ordered Stop   11/01/17 0800  cefoTEtan (CEFOTAN) 2 g in sodium chloride 0.9 % 100 mL IVPB     2 g 200 mL/hr over 30 Minutes Intravenous On call to O.R. 11/01/17 0759 11/01/17 1242   10/31/17 1200  erythromycin (ERY-TAB) EC tablet 1,000 mg     1,000 mg Oral Every 6 hours 10/31/17 1122 11/01/17 0004   10/31/17 1200  neomycin (MYCIFRADIN) tablet 1,000 mg     1,000 mg Oral Every 6 hours 10/31/17 1122 11/01/17 0003       Assessment/Plan  Charles Pickerel K McAdamsis a66 y.o.malewho is 5 days s/p right colectomy and sigmoidectomy secondary to adenocarcinoma which is complicated by  pertinent co-morbidities including CAD, DM, HTN, and HLD.  - Leukocytosis has resolved (10.5 on 11/03) - Tolerating regular diet with + Flatus/BM - Mobilizing well  - OK for discharge from surgery standpoint, need follow up within 1 week with Dr. Hampton Abbot for staple removal    LOS: 10 days    Edison Simon , PA-C [facility] 11/06/2017, 8:05 AM [phone] M-F: 7am - 4pm

## 2017-11-06 NOTE — Care Management Important Message (Signed)
Copy of signed IM left with patient in room.  

## 2017-11-06 NOTE — Discharge Instructions (Signed)
In addition to included general post-operative instructions:  Diet: Resume home heart healthy diet.   Activity: No heavy lifting >20 pounds (children, pets, laundry, garbage) or strenuous activity until follow-up, but light activity and walking are encouraged. Do not drive or drink alcohol if taking narcotic pain medications.  Wound care: You may shower/get incision wet with soapy water and pat dry (do not rub incisions), but no baths or submerging incision underwater until follow-up.   Medications: Resume all home medications. For mild to moderate pain: acetaminophen (Tylenol) or ibuprofen/naproxen (if no kidney disease). Combining Tylenol with alcohol can substantially increase your risk of causing liver disease. Narcotic pain medications, if prescribed, can be used for severe pain, though may cause nausea, constipation, and drowsiness. Do not combine Tylenol and Percocet (or similar) within a 6 hour period as Percocet (and similar) contain(s) Tylenol. If you do not need the narcotic pain medication, you do not need to fill the prescription.  Call office [phone] / [phone]) at any time if any questions, worsening pain, fevers/chills, bleeding, drainage from incision site, or other concerns.

## 2017-11-06 NOTE — Discharge Summary (Signed)
[name] at [place] NAME: [name]    MR#:  [id number]  DATE OF BIRTH:  [birth year]  DATE OF ADMISSION:  10/27/2017 ADMITTING PHYSICIAN: Henreitta Leber, MD  DATE OF DISCHARGE: 11/06/2017  PRIMARY CARE PHYSICIAN: Valera Castle, MD    ADMISSION DIAGNOSIS:  Intussusception ([place]) [K56.1]  DISCHARGE DIAGNOSIS:   Adenocarcinoma of cecum and rectal sigmoid status post right colectomy, sigmoidectomy and splenic flexure mobilization on 10/ 3/19 SECONDARY DIAGNOSIS:   Past Medical History:  Diagnosis Date  . BPH (benign prostatic hyperplasia)   . CAD (coronary artery disease)    a. 10/2014 Ant STEMI/PCI: LM nl, LAD 111m (2.75x38 Xience DES) - R->L collats, D1 60, D2 90ost, LCX 20ost, OM1/2/3 min irregs, RCA 44m (2.75x15 Xience DES), 50d, RPDA small;  b. 10/2014 Echo: EF 55-60%, mod ant/ap HK, Gr1 DD.  . Diabetes mellitus without complication ([place])   . Essential hypertension   . Hyperlipidemia   . Osteoarthritis     HOSPITAL COURSE:  77 year old male with history of CAD and diabetes who presents to the emergency room due to abdominal pain  1. Adenocarcinoma of cecum and rectosigmoid- s/p right colectomy, sigmoidectomy, and splenic flexure mobilization on 11/01/17 -General surgery following-tolerating clears --advanced to soft diet-- okay from surgical standpoint with Dr. Hampton Abbot to go home.  -Oncology following-- Dr Rogue Bussing will do workup as outpatient  2. Type 2 diabetes- blood sugars high after surgery -lantus 20 units and cont home meds. She will adjust diabetes meds including insulin according to his sugars at home. -Continue sliding scale and follow blood sugars -Resume metformin and trulicity  3.  BPH- Continue Flomax  4.  Essential hypertension- BPs soft this morning -Continue Coreg  5.  CAD- pt wascleared for surgery by cardiology -Continue aspirin and coreg  6. Iron deficiency anemia and B12  deficiency- Iron deficiency likely due to colon mass.  - Hemoglobin stable. -B12 supplementation  7. Generalized deconditioning and weakness -seen physical therapy. Recommends rehab. Patient is adamant he does not want to go to rehab.  Spoke with daughter Otho Perl regarding patient and updated her. Discussed with social worker to send out request for rehab in case patient decides to else patient will go home with home health PT . Daughter aware.  D/c today  CONSULTS OBTAINED:  Treatment Team:  Olean Ree, MD Cammie Sickle, MD  DRUG ALLERGIES:  No Known Allergies  DISCHARGE MEDICATIONS:   Allergies as of 11/06/2017   No Known Allergies     Medication List    TAKE these medications   aspirin 81 MG tablet Take 81 mg by mouth daily.   carvedilol 3.125 MG tablet Commonly known as:  COREG Take 1 tablet (3.125 mg total) by mouth 2 (two) times daily with a meal.   cyanocobalamin 1000 MCG tablet Take 1 tablet (1,000 mcg total) by mouth daily.   GLUCAGON EMERGENCY 1 MG injection Generic drug:  glucagon Inject 1 mg into the vein once as needed.   insulin aspart 100 UNIT/ML injection Commonly known as:  novoLOG Inject 8 Units into the skin 3 (three) times daily with meals. What changed:  how much to take   insulin glargine 100 UNIT/ML injection Commonly known as:  LANTUS Inject 0.2 mLs (20 Units total) into the skin at bedtime. What changed:  how much to take   metFORMIN 500 MG tablet Commonly known as:  GLUCOPHAGE Take 1,000 mg by mouth 2 (two) times daily with  a meal.   multivitamin with minerals Tabs tablet Take 1 tablet by mouth daily.   nitroGLYCERIN 0.4 MG SL tablet Commonly known as:  NITROSTAT Place 1 tablet (0.4 mg total) under the tongue every 5 (five) minutes as needed for chest pain.   tamsulosin 0.4 MG Caps capsule Commonly known as:  FLOMAX Take 0.4 mg by mouth daily.   TRULICITY 1.5 MC/9.4BS Sopn Generic drug:  Dulaglutide Inject 1.5  mg into the skin every Wednesday.       If you experience worsening of your admission symptoms, develop shortness of breath, life threatening emergency, suicidal or homicidal thoughts you must seek medical attention immediately by calling 911 or calling your MD immediately  if symptoms less severe.  You Must read complete instructions/literature along with all the possible adverse reactions/side effects for all the Medicines you take and that have been prescribed to you. Take any new Medicines after you have completely understood and accept all the possible adverse reactions/side effects.   Please note  You were cared for by a hospitalist during your hospital stay. If you have any questions about your discharge medications or the care you received while you were in the hospital after you are discharged, you can call the unit and asked to speak with the hospitalist on call if the hospitalist that took care of you is not available. Once you are discharged, your primary care physician will handle any further medical issues. Please note that NO REFILLS for any discharge medications will be authorized once you are discharged, as it is imperative that you return to your primary care physician (or establish a relationship with a primary care physician if you do not have one) for your aftercare needs so that they can reassess your need for medications and monitor your lab values. Today   SUBJECTIVE   I am ready to go home  VITAL SIGNS:  Blood pressure 116/63, pulse 68, temperature 97.7 F (36.5 C), temperature source Oral, resp. rate 20, height 5\' 9"  (1.753 m), weight 64.2 kg, SpO2 96 %.  I/O:    Intake/Output Summary (Last 24 hours) at 11/06/2017 0920 Last data filed at 11/05/2017 1426 Gross per 24 hour  Intake 0 ml  Output -  Net 0 ml    PHYSICAL EXAMINATION:  GENERAL:  77 y.o.-year-old patient lying in the bed with no acute distress.  EYES: Pupils equal, round, reactive to light and  accommodation. No scleral icterus. Extraocular muscles intact.  HEENT: Head atraumatic, normocephalic. Oropharynx and nasopharynx clear.  NECK:  Supple, no jugular venous distention. No thyroid enlargement, no tenderness.  LUNGS: Normal breath sounds bilaterally, no wheezing, rales,rhonchi or crepitation. No use of accessory muscles of respiration.  CARDIOVASCULAR: S1, S2 normal. No murmurs, rubs, or gallops.  ABDOMEN: Soft, non-tender, non-distended. Bowel sounds present. No organomegaly or mass. Midline surgical scar with Staples. Looks clean EXTREMITIES: No pedal edema, cyanosis, or clubbing.  NEUROLOGIC: Cranial nerves II through XII are intact. Muscle strength 5/5 in all extremities. Sensation intact. Gait not checked.  PSYCHIATRIC: The patient is alert and oriented x 3.  SKIN: No obvious rash, lesion, or ulcer.   DATA REVIEW:   CBC  Recent Labs  Lab 11/05/17 1312  WBC 10.5  HGB 10.4*  HCT 31.7*  PLT 273    Chemistries  Recent Labs  Lab 11/03/17 0503  NA 141  K 4.0  CL 106  CO2 27  GLUCOSE 221*  BUN 13  CREATININE 1.05  CALCIUM 9.0  Microbiology Results   Recent Results (from the past 240 hour(s))  Surgical PCR screen     Status: None   Collection Time: 10/31/17 10:11 PM  Result Value Ref Range Status   MRSA, PCR NEGATIVE NEGATIVE Final   Staphylococcus aureus NEGATIVE NEGATIVE Final    Comment: (NOTE) The Xpert SA Assay (FDA approved for NASAL specimens in patients 68 years of age and older), is one component of a comprehensive surveillance program. It is not intended to diagnose infection nor to guide or monitor treatment. Performed at [facility], [address], [place], [place] [address]     RADIOLOGY:  No results found.   Management plans discussed with the patient, family and they are in agreement.  CODE STATUS:     Code Status Orders  (From admission, onward)         Start     Ordered   10/27/17 1715  Full code   Continuous     10/27/17 1714        Code Status History    Date Active Date Inactive Code Status Order ID Comments User Context   06/16/2016 0326 06/18/2016 1950 Full Code [id number]  Lance Coon, MD Inpatient   07/28/2015 2238 07/31/2015 1536 Full Code [id number]  Harvie Bridge, DO Inpatient   10/20/2014 1038 10/22/2014 1438 Full Code [id number]  Wellington Hampshire, MD Inpatient      TOTAL TIME TAKING CARE OF THIS PATIENT: *40* minutes.    Fritzi Mandes M.D on 11/06/2017 at 9:20 AM  Between 7am to 6pm - Pager - [phone] After 6pm go to [email or web address] - password [email or web address] [facility]  Office  [phone]  CC: Primary care physician; Valera Castle, MD

## 2017-11-07 ENCOUNTER — Telehealth: Payer: Self-pay | Admitting: Internal Medicine

## 2017-11-07 DIAGNOSIS — I10 Essential (primary) hypertension: Secondary | ICD-10-CM | POA: Diagnosis not present

## 2017-11-07 DIAGNOSIS — I252 Old myocardial infarction: Secondary | ICD-10-CM | POA: Diagnosis not present

## 2017-11-07 DIAGNOSIS — E119 Type 2 diabetes mellitus without complications: Secondary | ICD-10-CM | POA: Diagnosis not present

## 2017-11-07 DIAGNOSIS — D122 Benign neoplasm of ascending colon: Secondary | ICD-10-CM | POA: Diagnosis not present

## 2017-11-07 DIAGNOSIS — D490 Neoplasm of unspecified behavior of digestive system: Secondary | ICD-10-CM | POA: Diagnosis not present

## 2017-11-07 DIAGNOSIS — K561 Intussusception: Secondary | ICD-10-CM | POA: Diagnosis not present

## 2017-11-07 DIAGNOSIS — C182 Malignant neoplasm of ascending colon: Secondary | ICD-10-CM | POA: Diagnosis not present

## 2017-11-07 DIAGNOSIS — Z483 Aftercare following surgery for neoplasm: Secondary | ICD-10-CM | POA: Diagnosis not present

## 2017-11-07 DIAGNOSIS — I2510 Atherosclerotic heart disease of native coronary artery without angina pectoris: Secondary | ICD-10-CM | POA: Diagnosis not present

## 2017-11-07 NOTE — Telephone Encounter (Signed)
[name]- Please have pt follow up with me in [place] clinic 8:45 on 11/12- labs- cbc/cmp. Dx; colon cancer. Thx GB

## 2017-11-08 ENCOUNTER — Other Ambulatory Visit: Payer: Self-pay

## 2017-11-08 DIAGNOSIS — C182 Malignant neoplasm of ascending colon: Secondary | ICD-10-CM

## 2017-11-09 LAB — SURGICAL PATHOLOGY

## 2017-11-10 DIAGNOSIS — K561 Intussusception: Secondary | ICD-10-CM | POA: Diagnosis not present

## 2017-11-10 DIAGNOSIS — C182 Malignant neoplasm of ascending colon: Secondary | ICD-10-CM | POA: Diagnosis not present

## 2017-11-10 DIAGNOSIS — I10 Essential (primary) hypertension: Secondary | ICD-10-CM | POA: Diagnosis not present

## 2017-11-10 DIAGNOSIS — Z483 Aftercare following surgery for neoplasm: Secondary | ICD-10-CM | POA: Diagnosis not present

## 2017-11-10 DIAGNOSIS — I252 Old myocardial infarction: Secondary | ICD-10-CM | POA: Diagnosis not present

## 2017-11-10 DIAGNOSIS — E119 Type 2 diabetes mellitus without complications: Secondary | ICD-10-CM | POA: Diagnosis not present

## 2017-11-10 DIAGNOSIS — I2510 Atherosclerotic heart disease of native coronary artery without angina pectoris: Secondary | ICD-10-CM | POA: Diagnosis not present

## 2017-11-10 DIAGNOSIS — D122 Benign neoplasm of ascending colon: Secondary | ICD-10-CM | POA: Diagnosis not present

## 2017-11-10 DIAGNOSIS — D490 Neoplasm of unspecified behavior of digestive system: Secondary | ICD-10-CM | POA: Diagnosis not present

## 2017-11-14 ENCOUNTER — Inpatient Hospital Stay: Payer: Self-pay | Admitting: Surgery

## 2017-11-14 ENCOUNTER — Ambulatory Visit: Payer: Medicare HMO | Admitting: Internal Medicine

## 2017-11-14 ENCOUNTER — Other Ambulatory Visit: Payer: Medicare HMO

## 2017-12-03 DEATH — deceased

## 2018-04-27 IMAGING — US US ABDOMEN LIMITED
1 series · 14 of 25 positions shown · non-contrast
Comparison: None.

CLINICAL DATA: Epigastric and chest pain

EXAM:
US ABDOMEN LIMITED - RIGHT UPPER QUADRANT

[Series 1: us abdomen limited · 0.22mm/px · 14 of 27 slices shown]
[im 1/27]
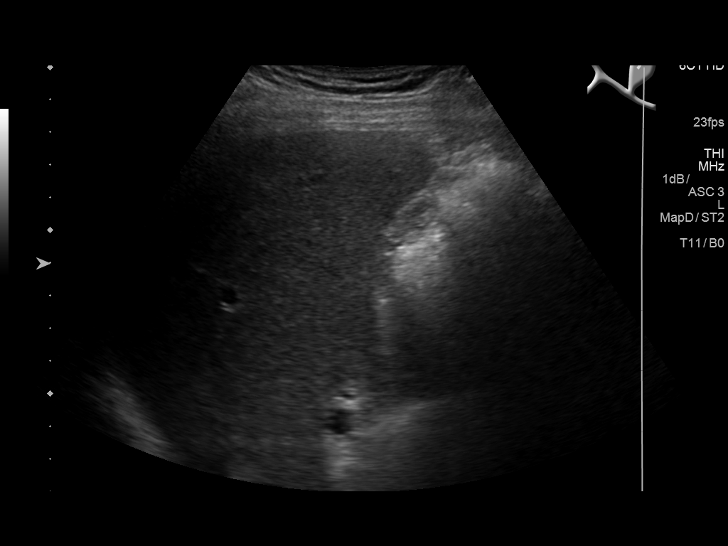
[im 3/27]
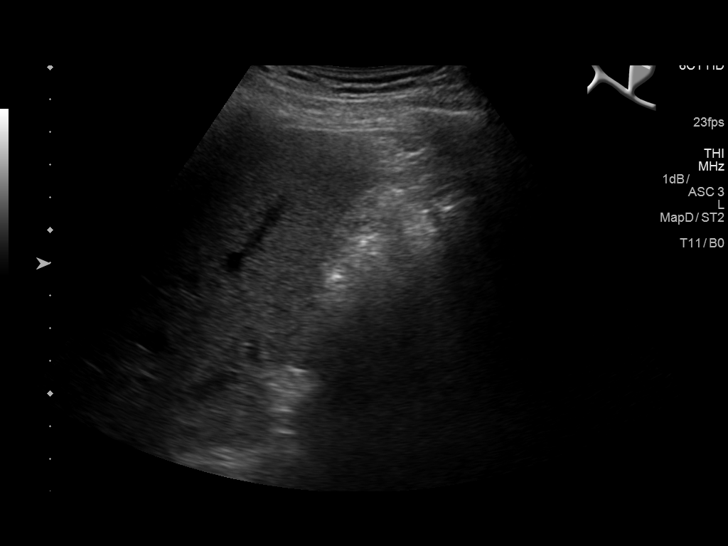
[im 5/27]
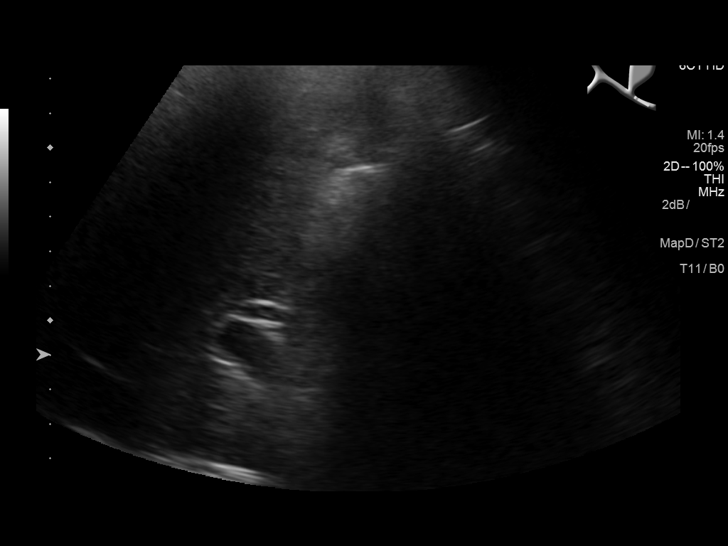
[im 7/27]
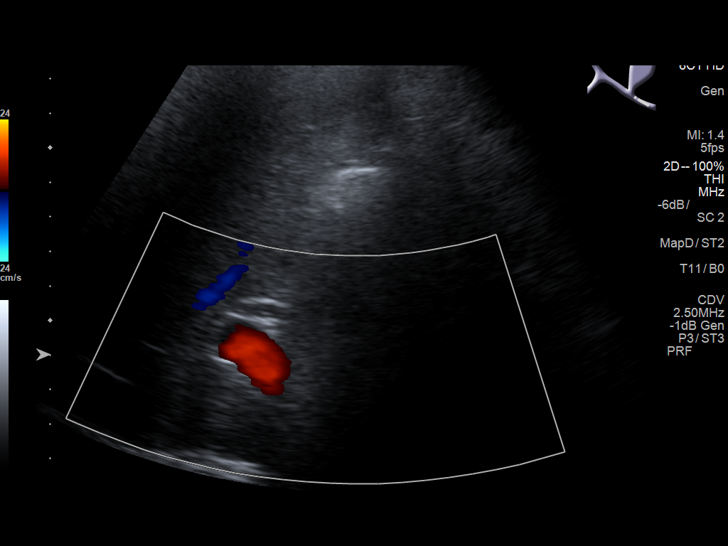
[im 9/27]
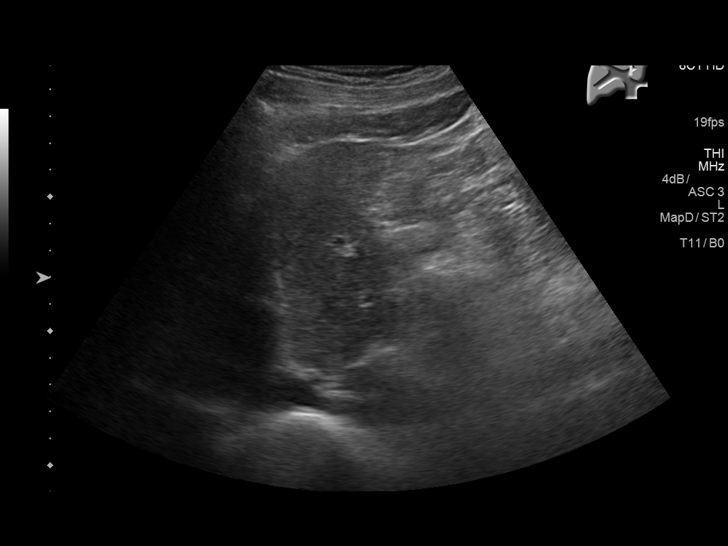
[im 10/27]
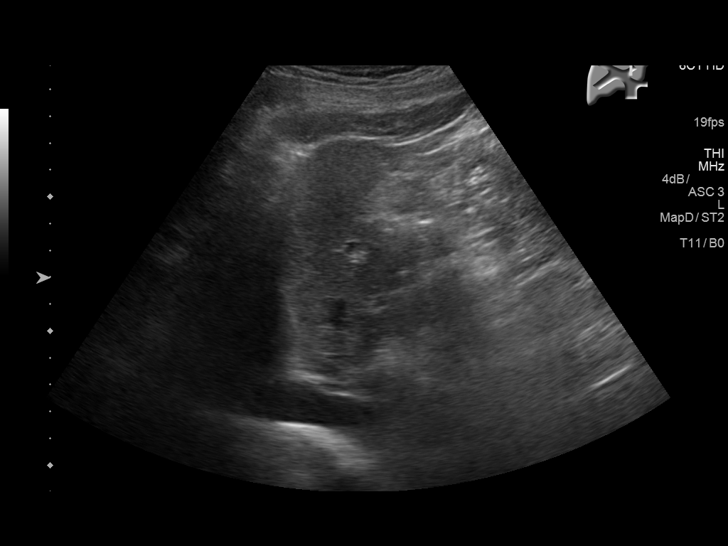
[im 12/27]
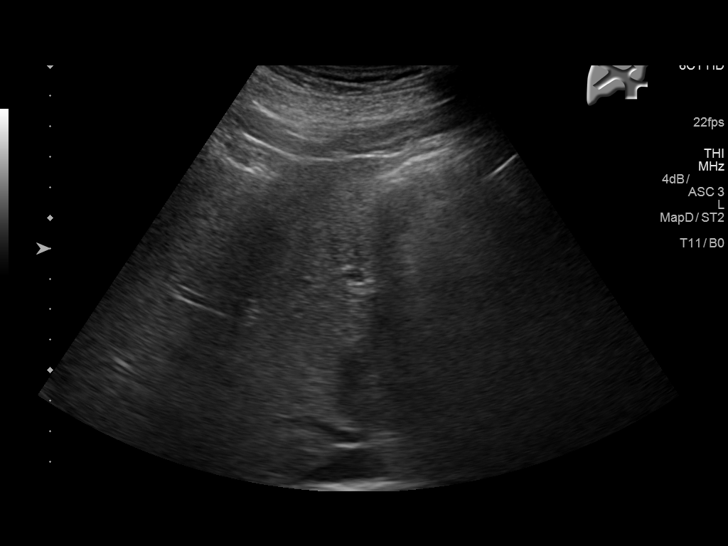
[im 15/27]
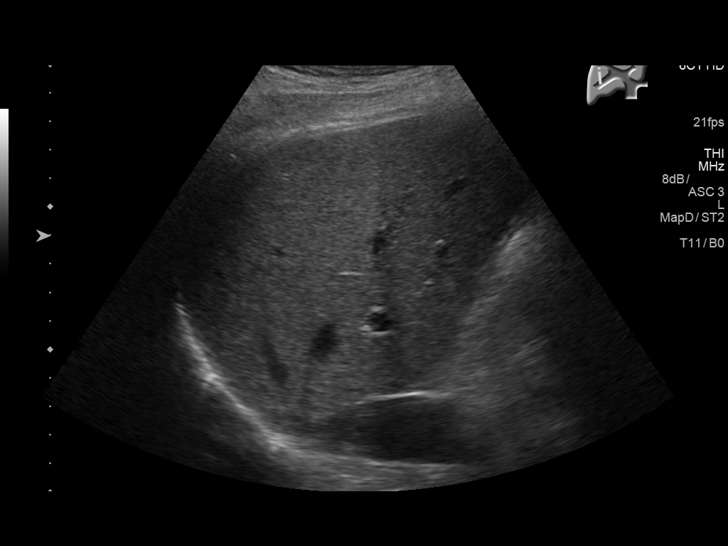
[im 17/27]
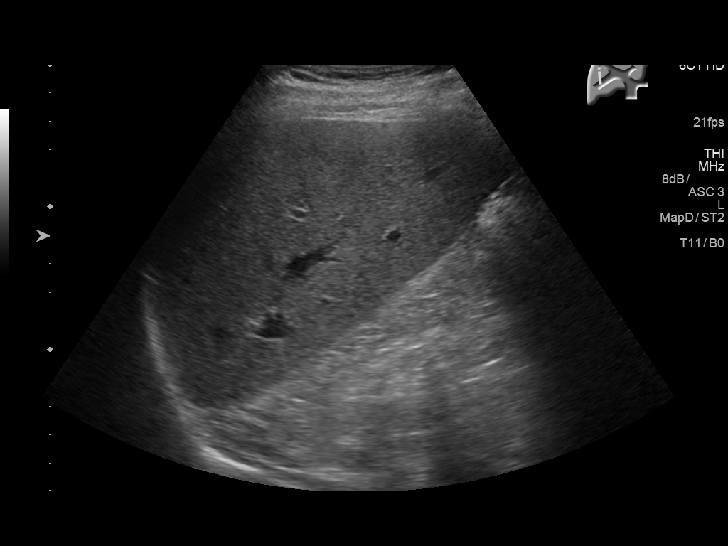
[im 18/27]
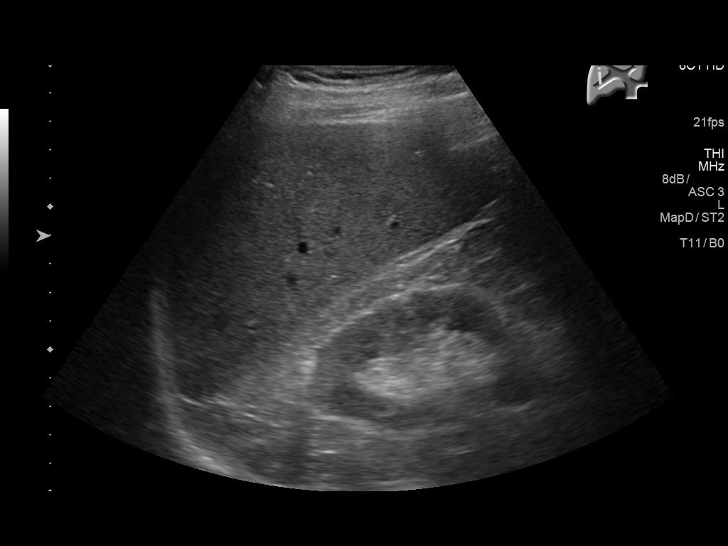
[im 20/27]
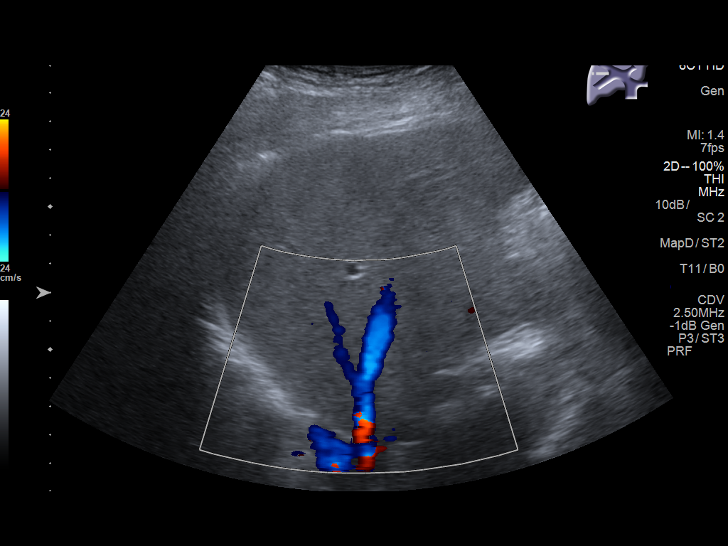
[im 22/27]
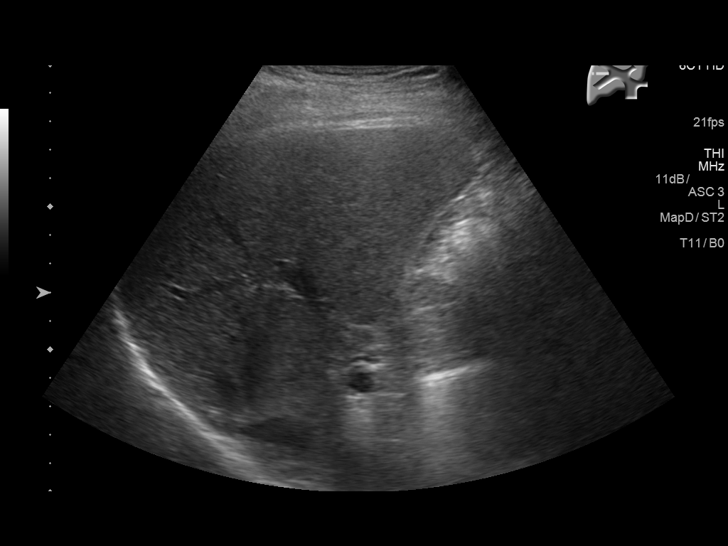
[im 24/27]
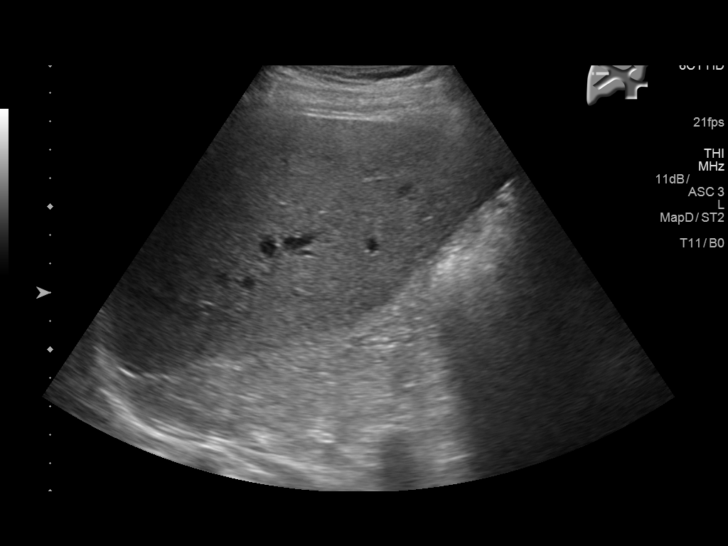
[im 27/27]
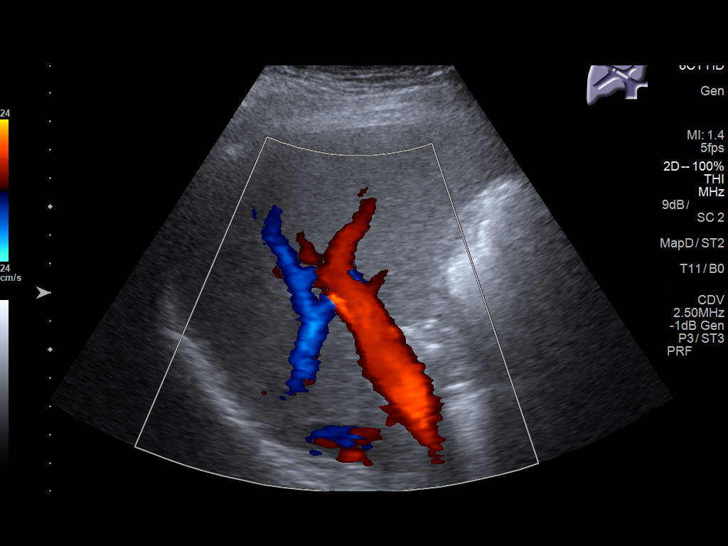

[14 of 25 positions shown; findings below may reference images not displayed]

FINDINGS: Gallbladder:

The gallbladder has been surgically removed

Common bile duct:

Diameter: 4.6 mm

Liver:

No focal lesion identified. Within normal limits in parenchymal
echogenicity.
IMPRESSION: No acute abnormalities.

## 2018-07-08 IMAGING — RF DG ESOPHAGUS
1 series · 11 of 11 positions shown · non-contrast
Comparison: None in PACs

CLINICAL DATA: Patient reports sharp right-sided chest pain when
eating; history of diabetes, recent renal failure, coronary artery
disease.

EXAM:
ESOPHOGRAM / BARIUM SWALLOW / BARIUM TABLET STUDY
TECHNIQUE: Combined double contrast and single contrast examination performed
using effervescent crystals, thick barium liquid, and thin barium
liquid. The patient was observed with fluoroscopy swallowing a 13 mm
barium sulphate tablet.
FLUOROSCOPY TIME:  Fluoroscopy Time:  0 minutes, 54 seconds
Number of Acquired Images:  11

[Series 1: fluoro_barium 2fps_bw · 0.17mm/px · 11 of 11 slices shown]
[im 1/11]
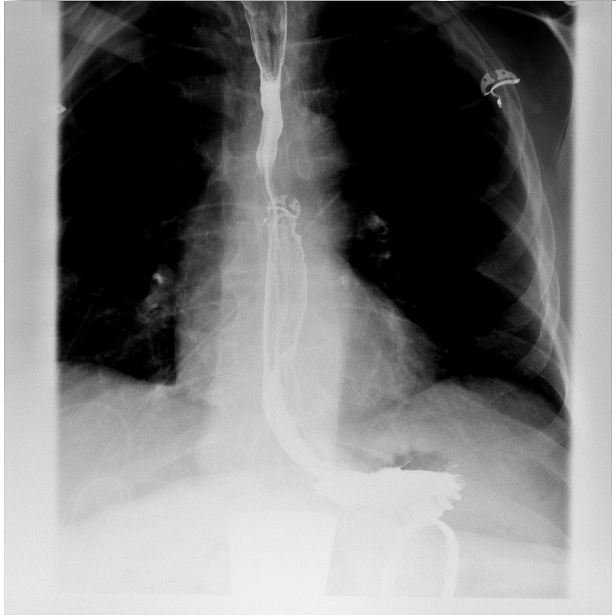
[im 2/11]
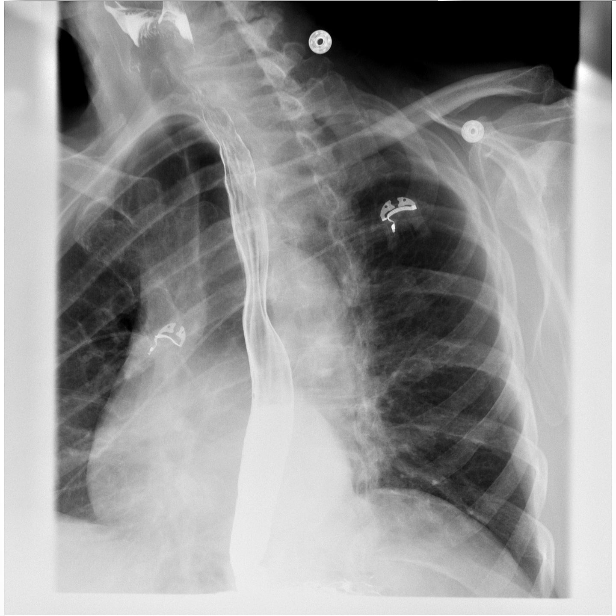
[im 3/11]
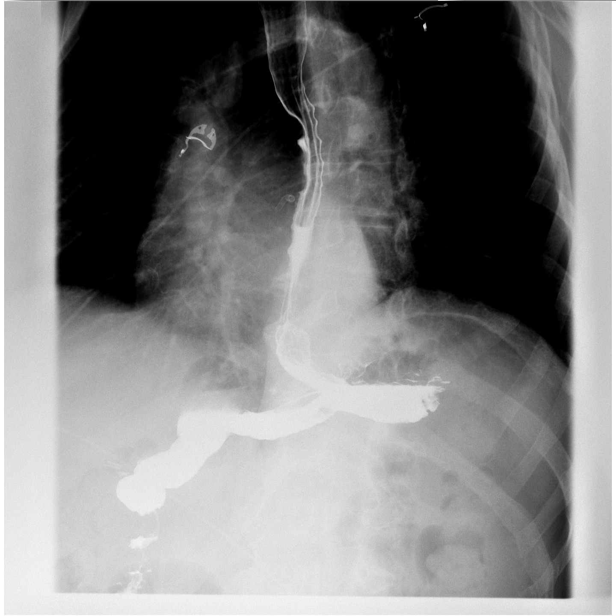
[im 4/11]
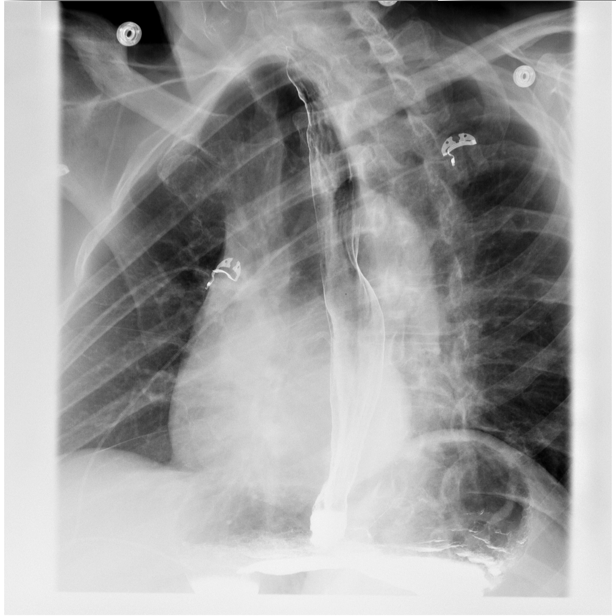
[im 5/11]
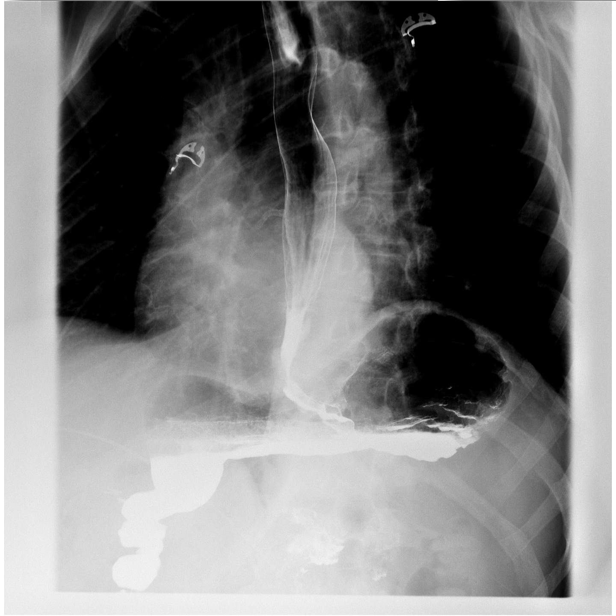
[im 6/11]
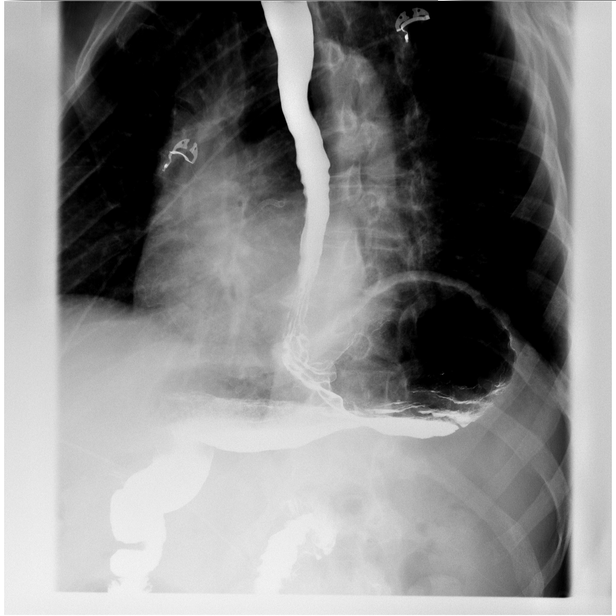
[im 7/11]
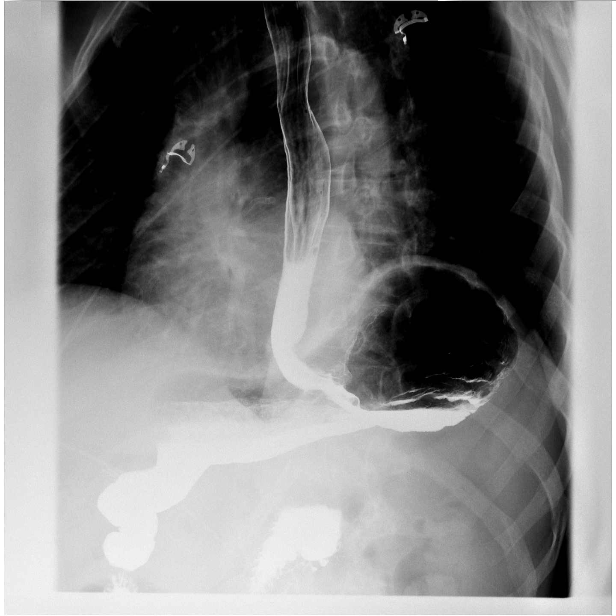
[im 8/11]
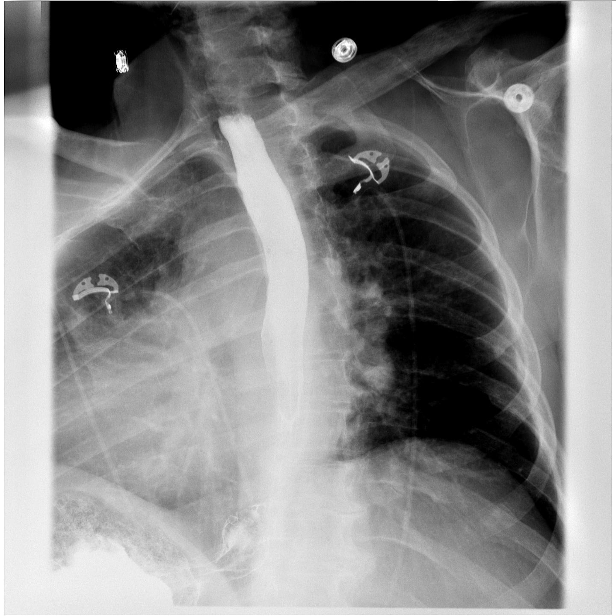
[im 9/11]
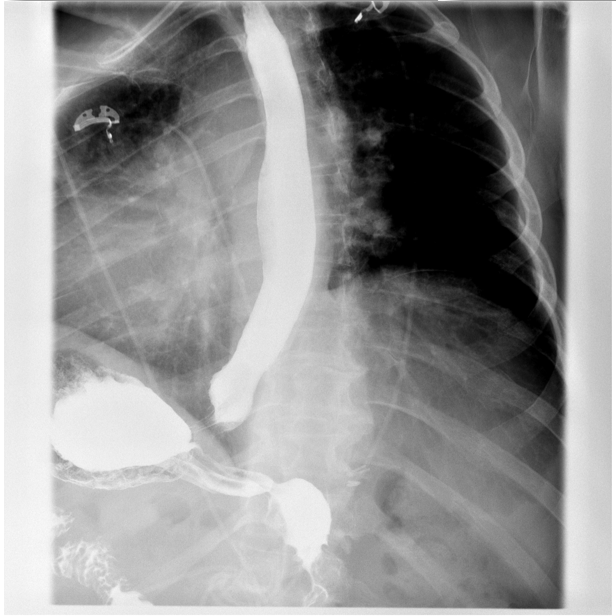
[im 10/11]
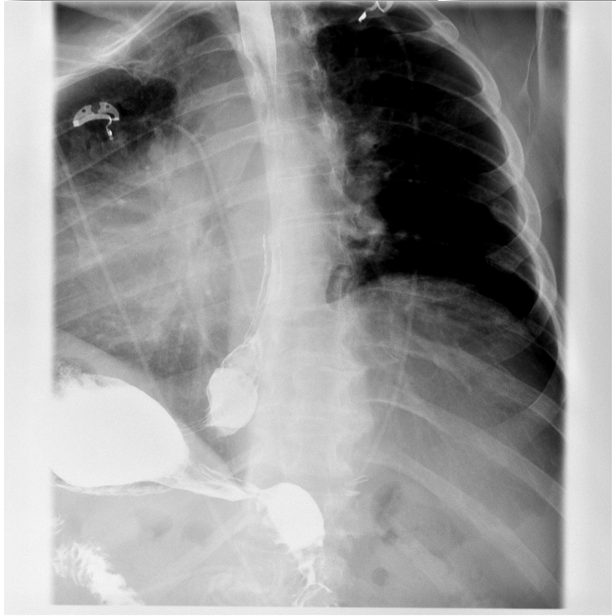
[im 11/11]
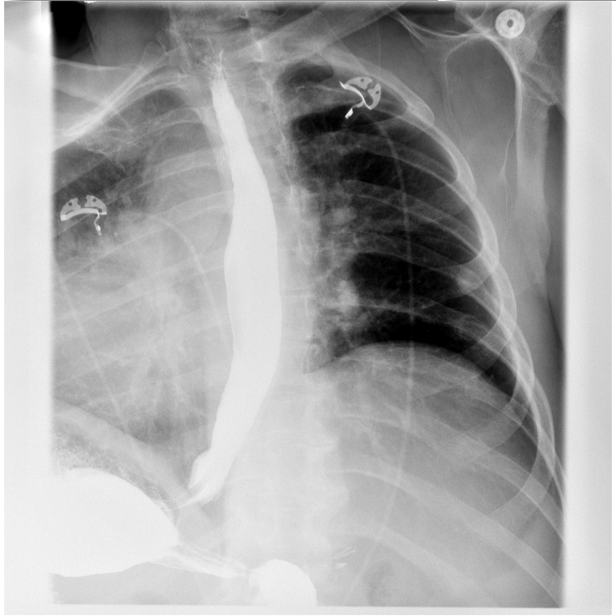

[11 of 11 positions shown; findings below may reference images not displayed]

FINDINGS: The patient ingested thick and thin barium and the gas-forming
crystals without difficulty. The cervical esophagus distended well.
There was no laryngeal penetration of the barium. The thoracic
esophagus also distended well. A small reducible hiatal hernia was
observed. No gastroesophageal reflux was demonstrated. There was no
evidence of stricture nor esophagitis. Intermittent tertiary
contractions were observed. The barium tablet passed promptly for
mouth to the stomach.
IMPRESSION: Small reducible hiatal hernia without evidence of reflux, stricture,
or esophagitis. Intermittent tertiary contractions consistent with
presbyesophagus. No laryngeal penetration of the barium.

The patient did not experience is typical sharp right-sided anterior
chest discomfort during this study.
